# Patient Record
Sex: Female | Born: 1950 | Race: White | Hispanic: No | Marital: Married | Smoking: Former smoker
Health system: Southern US, Community
[De-identification: ages and names within clinical notes are randomized; demographics above are authoritative.]

## PROBLEM LIST (undated history)

## (undated) DIAGNOSIS — K219 Gastro-esophageal reflux disease without esophagitis: Secondary | ICD-10-CM

## (undated) DIAGNOSIS — I10 Essential (primary) hypertension: Secondary | ICD-10-CM

## (undated) DIAGNOSIS — Z860100 Personal history of colon polyps, unspecified: Secondary | ICD-10-CM

## (undated) DIAGNOSIS — Z7901 Long term (current) use of anticoagulants: Secondary | ICD-10-CM

## (undated) DIAGNOSIS — I4819 Other persistent atrial fibrillation: Secondary | ICD-10-CM

## (undated) DIAGNOSIS — N393 Stress incontinence (female) (male): Secondary | ICD-10-CM

## (undated) DIAGNOSIS — Z973 Presence of spectacles and contact lenses: Secondary | ICD-10-CM

## (undated) DIAGNOSIS — M1990 Unspecified osteoarthritis, unspecified site: Secondary | ICD-10-CM

## (undated) DIAGNOSIS — I2510 Atherosclerotic heart disease of native coronary artery without angina pectoris: Secondary | ICD-10-CM

## (undated) DIAGNOSIS — E785 Hyperlipidemia, unspecified: Secondary | ICD-10-CM

## (undated) DIAGNOSIS — D494 Neoplasm of unspecified behavior of bladder: Secondary | ICD-10-CM

## (undated) DIAGNOSIS — H269 Unspecified cataract: Secondary | ICD-10-CM

## (undated) DIAGNOSIS — E039 Hypothyroidism, unspecified: Secondary | ICD-10-CM

## (undated) DIAGNOSIS — K08109 Complete loss of teeth, unspecified cause, unspecified class: Secondary | ICD-10-CM

## (undated) DIAGNOSIS — I252 Old myocardial infarction: Secondary | ICD-10-CM

## (undated) DIAGNOSIS — Z972 Presence of dental prosthetic device (complete) (partial): Secondary | ICD-10-CM

## (undated) DIAGNOSIS — G4730 Sleep apnea, unspecified: Secondary | ICD-10-CM

## (undated) DIAGNOSIS — E119 Type 2 diabetes mellitus without complications: Secondary | ICD-10-CM

## (undated) HISTORY — DX: Hypothyroidism, unspecified: E03.9

## (undated) HISTORY — DX: Unspecified cataract: H26.9

## (undated) HISTORY — DX: Hyperlipidemia, unspecified: E78.5

## (undated) HISTORY — PX: CARDIOVASCULAR STRESS TEST: SHX262

## (undated) HISTORY — DX: Atherosclerotic heart disease of native coronary artery without angina pectoris: I25.10

## (undated) HISTORY — PX: COLONOSCOPY: SHX174

## (undated) HISTORY — DX: Type 2 diabetes mellitus without complications: E11.9

---

## 1972-12-27 HISTORY — PX: TUBAL LIGATION: SHX77

## 1998-01-27 HISTORY — PX: CORONARY ANGIOPLASTY: SHX604

## 1998-04-14 ENCOUNTER — Encounter: Admission: RE | Admit: 1998-04-14 | Discharge: 1998-04-14 | Payer: Self-pay | Admitting: Family Medicine

## 1998-07-01 ENCOUNTER — Encounter: Admission: RE | Admit: 1998-07-01 | Discharge: 1998-07-01 | Payer: Self-pay | Admitting: Family Medicine

## 1998-08-19 ENCOUNTER — Encounter: Admission: RE | Admit: 1998-08-19 | Discharge: 1998-08-19 | Payer: Self-pay | Admitting: Sports Medicine

## 1998-08-19 ENCOUNTER — Other Ambulatory Visit: Admission: RE | Admit: 1998-08-19 | Discharge: 1998-08-19 | Payer: Self-pay | Admitting: Family Medicine

## 1998-11-13 ENCOUNTER — Encounter: Admission: RE | Admit: 1998-11-13 | Discharge: 1998-11-13 | Payer: Self-pay | Admitting: Family Medicine

## 1998-11-24 ENCOUNTER — Encounter: Admission: RE | Admit: 1998-11-24 | Discharge: 1998-11-24 | Payer: Self-pay | Admitting: Family Medicine

## 1999-01-09 ENCOUNTER — Inpatient Hospital Stay (HOSPITAL_COMMUNITY): Admission: EM | Admit: 1999-01-09 | Discharge: 1999-01-10 | Payer: Self-pay | Admitting: Emergency Medicine

## 1999-01-10 ENCOUNTER — Encounter: Payer: Self-pay | Admitting: *Deleted

## 1999-02-13 ENCOUNTER — Encounter: Admission: RE | Admit: 1999-02-13 | Discharge: 1999-02-13 | Payer: Self-pay | Admitting: Family Medicine

## 1999-02-23 ENCOUNTER — Encounter: Admission: RE | Admit: 1999-02-23 | Discharge: 1999-02-23 | Payer: Self-pay | Admitting: Family Medicine

## 1999-07-31 ENCOUNTER — Encounter: Admission: RE | Admit: 1999-07-31 | Discharge: 1999-07-31 | Payer: Self-pay | Admitting: Family Medicine

## 1999-08-20 ENCOUNTER — Encounter: Admission: RE | Admit: 1999-08-20 | Discharge: 1999-08-20 | Payer: Self-pay | Admitting: Family Medicine

## 1999-08-21 ENCOUNTER — Encounter: Admission: RE | Admit: 1999-08-21 | Discharge: 1999-08-21 | Payer: Self-pay | Admitting: Family Medicine

## 1999-10-09 ENCOUNTER — Encounter: Admission: RE | Admit: 1999-10-09 | Discharge: 1999-10-09 | Payer: Self-pay | Admitting: Family Medicine

## 2000-01-26 ENCOUNTER — Encounter: Admission: RE | Admit: 2000-01-26 | Discharge: 2000-01-26 | Payer: Self-pay | Admitting: Sports Medicine

## 2000-02-05 ENCOUNTER — Encounter: Payer: Self-pay | Admitting: Sports Medicine

## 2000-02-05 ENCOUNTER — Encounter: Admission: RE | Admit: 2000-02-05 | Discharge: 2000-02-05 | Payer: Self-pay | Admitting: Sports Medicine

## 2000-03-17 ENCOUNTER — Encounter: Admission: RE | Admit: 2000-03-17 | Discharge: 2000-03-17 | Payer: Self-pay | Admitting: Family Medicine

## 2000-05-10 ENCOUNTER — Encounter: Admission: RE | Admit: 2000-05-10 | Discharge: 2000-05-10 | Payer: Self-pay | Admitting: Family Medicine

## 2000-06-07 ENCOUNTER — Encounter: Admission: RE | Admit: 2000-06-07 | Discharge: 2000-06-07 | Payer: Self-pay | Admitting: Family Medicine

## 2000-08-17 ENCOUNTER — Ambulatory Visit (HOSPITAL_COMMUNITY): Admission: RE | Admit: 2000-08-17 | Discharge: 2000-08-17 | Payer: Self-pay | Admitting: Family Medicine

## 2000-08-17 ENCOUNTER — Encounter: Admission: RE | Admit: 2000-08-17 | Discharge: 2000-08-17 | Payer: Self-pay | Admitting: Family Medicine

## 2000-08-19 ENCOUNTER — Encounter: Admission: RE | Admit: 2000-08-19 | Discharge: 2000-08-19 | Payer: Self-pay | Admitting: Family Medicine

## 2000-10-17 ENCOUNTER — Encounter: Admission: RE | Admit: 2000-10-17 | Discharge: 2000-10-17 | Payer: Self-pay | Admitting: Family Medicine

## 2000-10-18 ENCOUNTER — Encounter: Admission: RE | Admit: 2000-10-18 | Discharge: 2000-10-18 | Payer: Self-pay | Admitting: Family Medicine

## 2000-11-11 ENCOUNTER — Encounter: Admission: RE | Admit: 2000-11-11 | Discharge: 2000-11-11 | Payer: Self-pay | Admitting: Family Medicine

## 2000-12-07 ENCOUNTER — Encounter: Admission: RE | Admit: 2000-12-07 | Discharge: 2000-12-07 | Payer: Self-pay | Admitting: Family Medicine

## 2001-01-31 ENCOUNTER — Emergency Department (HOSPITAL_COMMUNITY): Admission: EM | Admit: 2001-01-31 | Discharge: 2001-01-31 | Payer: Self-pay | Admitting: *Deleted

## 2001-03-13 ENCOUNTER — Encounter: Admission: RE | Admit: 2001-03-13 | Discharge: 2001-03-13 | Payer: Self-pay | Admitting: Family Medicine

## 2001-04-24 ENCOUNTER — Encounter: Admission: RE | Admit: 2001-04-24 | Discharge: 2001-04-24 | Payer: Self-pay | Admitting: Family Medicine

## 2001-07-18 ENCOUNTER — Encounter: Admission: RE | Admit: 2001-07-18 | Discharge: 2001-07-18 | Payer: Self-pay | Admitting: Family Medicine

## 2001-07-25 ENCOUNTER — Encounter: Admission: RE | Admit: 2001-07-25 | Discharge: 2001-07-25 | Payer: Self-pay | Admitting: Family Medicine

## 2001-09-26 ENCOUNTER — Encounter: Admission: RE | Admit: 2001-09-26 | Discharge: 2001-09-26 | Payer: Self-pay | Admitting: Family Medicine

## 2001-10-16 ENCOUNTER — Encounter: Admission: RE | Admit: 2001-10-16 | Discharge: 2001-10-16 | Payer: Self-pay | Admitting: Family Medicine

## 2002-03-28 ENCOUNTER — Encounter: Admission: RE | Admit: 2002-03-28 | Discharge: 2002-03-28 | Payer: Self-pay | Admitting: Family Medicine

## 2002-04-19 ENCOUNTER — Encounter: Admission: RE | Admit: 2002-04-19 | Discharge: 2002-04-19 | Payer: Self-pay | Admitting: Family Medicine

## 2002-04-20 ENCOUNTER — Encounter: Admission: RE | Admit: 2002-04-20 | Discharge: 2002-04-20 | Payer: Self-pay | Admitting: *Deleted

## 2002-04-25 ENCOUNTER — Encounter: Admission: RE | Admit: 2002-04-25 | Discharge: 2002-04-25 | Payer: Self-pay | Admitting: Family Medicine

## 2002-05-14 ENCOUNTER — Encounter: Admission: RE | Admit: 2002-05-14 | Discharge: 2002-05-14 | Payer: Self-pay | Admitting: Family Medicine

## 2002-06-28 ENCOUNTER — Encounter: Admission: RE | Admit: 2002-06-28 | Discharge: 2002-06-28 | Payer: Self-pay | Admitting: Family Medicine

## 2002-09-10 ENCOUNTER — Encounter: Admission: RE | Admit: 2002-09-10 | Discharge: 2002-09-10 | Payer: Self-pay | Admitting: Family Medicine

## 2002-09-10 ENCOUNTER — Encounter: Admission: RE | Admit: 2002-09-10 | Discharge: 2002-09-10 | Payer: Self-pay | Admitting: Sports Medicine

## 2002-09-10 ENCOUNTER — Encounter: Payer: Self-pay | Admitting: Sports Medicine

## 2002-10-02 ENCOUNTER — Encounter: Admission: RE | Admit: 2002-10-02 | Discharge: 2002-10-02 | Payer: Self-pay | Admitting: Family Medicine

## 2002-10-09 ENCOUNTER — Encounter: Admission: RE | Admit: 2002-10-09 | Discharge: 2002-10-09 | Payer: Self-pay | Admitting: Family Medicine

## 2002-10-23 ENCOUNTER — Encounter: Admission: RE | Admit: 2002-10-23 | Discharge: 2002-10-23 | Payer: Self-pay | Admitting: Family Medicine

## 2002-11-06 ENCOUNTER — Encounter: Admission: RE | Admit: 2002-11-06 | Discharge: 2002-11-06 | Payer: Self-pay | Admitting: Family Medicine

## 2002-11-08 ENCOUNTER — Encounter: Admission: RE | Admit: 2002-11-08 | Discharge: 2002-11-08 | Payer: Self-pay | Admitting: Family Medicine

## 2002-12-25 ENCOUNTER — Encounter: Admission: RE | Admit: 2002-12-25 | Discharge: 2002-12-25 | Payer: Self-pay | Admitting: Family Medicine

## 2003-04-24 ENCOUNTER — Encounter: Admission: RE | Admit: 2003-04-24 | Discharge: 2003-04-24 | Payer: Self-pay | Admitting: Family Medicine

## 2003-06-25 ENCOUNTER — Encounter: Admission: RE | Admit: 2003-06-25 | Discharge: 2003-06-25 | Payer: Self-pay | Admitting: Sports Medicine

## 2004-05-18 ENCOUNTER — Emergency Department (HOSPITAL_COMMUNITY): Admission: EM | Admit: 2004-05-18 | Discharge: 2004-05-18 | Payer: Self-pay | Admitting: Emergency Medicine

## 2004-07-22 ENCOUNTER — Encounter: Admission: RE | Admit: 2004-07-22 | Discharge: 2004-07-22 | Payer: Self-pay | Admitting: Family Medicine

## 2004-07-27 ENCOUNTER — Encounter: Admission: RE | Admit: 2004-07-27 | Discharge: 2004-07-27 | Payer: Self-pay | Admitting: Family Medicine

## 2004-07-28 ENCOUNTER — Encounter: Admission: RE | Admit: 2004-07-28 | Discharge: 2004-07-28 | Payer: Self-pay | Admitting: Sports Medicine

## 2004-08-26 ENCOUNTER — Encounter: Admission: RE | Admit: 2004-08-26 | Discharge: 2004-08-26 | Payer: Self-pay | Admitting: Family Medicine

## 2004-09-02 ENCOUNTER — Ambulatory Visit: Payer: Self-pay | Admitting: Family Medicine

## 2004-10-06 ENCOUNTER — Ambulatory Visit: Payer: Self-pay | Admitting: Family Medicine

## 2004-10-12 ENCOUNTER — Ambulatory Visit: Payer: Self-pay | Admitting: Sports Medicine

## 2004-11-11 ENCOUNTER — Ambulatory Visit (HOSPITAL_COMMUNITY): Admission: RE | Admit: 2004-11-11 | Discharge: 2004-11-11 | Payer: Self-pay | Admitting: Gastroenterology

## 2004-11-11 ENCOUNTER — Encounter (INDEPENDENT_AMBULATORY_CARE_PROVIDER_SITE_OTHER): Payer: Self-pay | Admitting: Specialist

## 2004-11-11 DIAGNOSIS — Z860100 Personal history of colon polyps, unspecified: Secondary | ICD-10-CM | POA: Insufficient documentation

## 2005-05-19 ENCOUNTER — Emergency Department (HOSPITAL_COMMUNITY): Admission: EM | Admit: 2005-05-19 | Discharge: 2005-05-19 | Payer: Self-pay | Admitting: Family Medicine

## 2006-04-26 ENCOUNTER — Encounter (INDEPENDENT_AMBULATORY_CARE_PROVIDER_SITE_OTHER): Payer: Self-pay | Admitting: *Deleted

## 2006-04-26 LAB — CONVERTED CEMR LAB

## 2006-05-09 ENCOUNTER — Ambulatory Visit: Payer: Self-pay | Admitting: Family Medicine

## 2006-05-26 ENCOUNTER — Ambulatory Visit: Payer: Self-pay | Admitting: Family Medicine

## 2006-10-03 ENCOUNTER — Emergency Department (HOSPITAL_COMMUNITY): Admission: EM | Admit: 2006-10-03 | Discharge: 2006-10-03 | Payer: Self-pay | Admitting: Emergency Medicine

## 2007-01-13 ENCOUNTER — Ambulatory Visit: Payer: Self-pay | Admitting: Family Medicine

## 2007-02-13 ENCOUNTER — Ambulatory Visit: Payer: Self-pay | Admitting: Family Medicine

## 2007-02-16 ENCOUNTER — Ambulatory Visit: Payer: Self-pay | Admitting: Family Medicine

## 2007-02-16 ENCOUNTER — Encounter (INDEPENDENT_AMBULATORY_CARE_PROVIDER_SITE_OTHER): Payer: Self-pay | Admitting: Family Medicine

## 2007-02-16 LAB — CONVERTED CEMR LAB
Cholesterol: 139 mg/dL (ref 0–200)
HDL: 49 mg/dL (ref >39)
LDL Cholesterol: 64 mg/dL (ref 0–99)
TSH: 0.687 microintl units/mL (ref 0.350–5.50)
Total CHOL/HDL Ratio: 2.8
Triglycerides: 130 mg/dL (ref <150)
VLDL: 26 mg/dL (ref 0–40)

## 2007-02-23 DIAGNOSIS — E039 Hypothyroidism, unspecified: Secondary | ICD-10-CM | POA: Insufficient documentation

## 2007-02-23 DIAGNOSIS — I252 Old myocardial infarction: Secondary | ICD-10-CM | POA: Insufficient documentation

## 2007-02-24 ENCOUNTER — Encounter (INDEPENDENT_AMBULATORY_CARE_PROVIDER_SITE_OTHER): Payer: Self-pay | Admitting: *Deleted

## 2007-05-26 ENCOUNTER — Ambulatory Visit: Payer: Self-pay | Admitting: Family Medicine

## 2007-08-28 LAB — CONVERTED CEMR LAB: Pap Smear: NORMAL

## 2007-09-04 ENCOUNTER — Encounter (INDEPENDENT_AMBULATORY_CARE_PROVIDER_SITE_OTHER): Payer: Self-pay | Admitting: Family Medicine

## 2007-09-04 ENCOUNTER — Ambulatory Visit: Payer: Self-pay | Admitting: Sports Medicine

## 2007-09-04 LAB — CONVERTED CEMR LAB: Whiff Test: POSITIVE

## 2007-09-06 ENCOUNTER — Encounter (INDEPENDENT_AMBULATORY_CARE_PROVIDER_SITE_OTHER): Payer: Self-pay | Admitting: Family Medicine

## 2007-09-26 ENCOUNTER — Telehealth (INDEPENDENT_AMBULATORY_CARE_PROVIDER_SITE_OTHER): Payer: Self-pay | Admitting: *Deleted

## 2007-09-26 ENCOUNTER — Ambulatory Visit: Payer: Self-pay | Admitting: Family Medicine

## 2007-09-26 ENCOUNTER — Encounter (INDEPENDENT_AMBULATORY_CARE_PROVIDER_SITE_OTHER): Payer: Self-pay | Admitting: Family Medicine

## 2007-10-26 ENCOUNTER — Ambulatory Visit: Payer: Self-pay | Admitting: Family Medicine

## 2008-01-02 ENCOUNTER — Telehealth: Payer: Self-pay | Admitting: *Deleted

## 2008-01-03 ENCOUNTER — Ambulatory Visit: Payer: Self-pay | Admitting: Family Medicine

## 2008-01-03 ENCOUNTER — Encounter (INDEPENDENT_AMBULATORY_CARE_PROVIDER_SITE_OTHER): Payer: Self-pay | Admitting: *Deleted

## 2008-01-04 ENCOUNTER — Encounter: Admission: RE | Admit: 2008-01-04 | Discharge: 2008-01-04 | Payer: Self-pay | Admitting: Sports Medicine

## 2008-01-09 ENCOUNTER — Telehealth (INDEPENDENT_AMBULATORY_CARE_PROVIDER_SITE_OTHER): Payer: Self-pay | Admitting: Family Medicine

## 2008-03-15 ENCOUNTER — Ambulatory Visit: Payer: Self-pay | Admitting: Family Medicine

## 2008-03-18 ENCOUNTER — Encounter (INDEPENDENT_AMBULATORY_CARE_PROVIDER_SITE_OTHER): Payer: Self-pay | Admitting: Family Medicine

## 2008-06-06 ENCOUNTER — Encounter (INDEPENDENT_AMBULATORY_CARE_PROVIDER_SITE_OTHER): Payer: Self-pay | Admitting: Family Medicine

## 2008-06-06 ENCOUNTER — Encounter: Payer: Self-pay | Admitting: *Deleted

## 2008-06-06 ENCOUNTER — Ambulatory Visit: Payer: Self-pay | Admitting: Family Medicine

## 2008-06-06 LAB — CONVERTED CEMR LAB

## 2008-06-11 ENCOUNTER — Encounter (INDEPENDENT_AMBULATORY_CARE_PROVIDER_SITE_OTHER): Payer: Self-pay | Admitting: Family Medicine

## 2008-06-11 LAB — CONVERTED CEMR LAB
ALT: 21 units/L (ref 0–35)
AST: 19 units/L (ref 0–37)
Albumin: 4.1 g/dL (ref 3.5–5.2)
Alkaline Phosphatase: 68 units/L (ref 39–117)
BUN: 15 mg/dL (ref 6–23)
CO2: 24 meq/L (ref 19–32)
Calcium: 9.2 mg/dL (ref 8.4–10.5)
Chloride: 106 meq/L (ref 96–112)
Cholesterol: 180 mg/dL (ref 0–200)
Creatinine, Ser: 0.88 mg/dL (ref 0.40–1.20)
Glucose, Bld: 100 mg/dL — ABNORMAL HIGH (ref 70–99)
HDL: 63 mg/dL (ref >39)
LDL Cholesterol: 89 mg/dL (ref 0–99)
Potassium: 4.4 meq/L (ref 3.5–5.3)
Sodium: 140 meq/L (ref 135–145)
TSH: 3.441 microintl units/mL (ref 0.350–5.50)
Total Bilirubin: 0.5 mg/dL (ref 0.3–1.2)
Total CHOL/HDL Ratio: 2.9
Total Protein: 7.2 g/dL (ref 6.0–8.3)
Triglycerides: 142 mg/dL (ref <150)
VLDL: 28 mg/dL (ref 0–40)

## 2008-07-17 ENCOUNTER — Ambulatory Visit: Payer: Self-pay | Admitting: Family Medicine

## 2008-09-13 ENCOUNTER — Ambulatory Visit: Payer: Self-pay | Admitting: Family Medicine

## 2008-09-13 ENCOUNTER — Encounter (INDEPENDENT_AMBULATORY_CARE_PROVIDER_SITE_OTHER): Payer: Self-pay | Admitting: Family Medicine

## 2008-09-13 DIAGNOSIS — M25559 Pain in unspecified hip: Secondary | ICD-10-CM | POA: Insufficient documentation

## 2008-09-13 LAB — CONVERTED CEMR LAB
ALT: 22 units/L (ref 0–35)
AST: 22 units/L (ref 0–37)
Albumin: 4.1 g/dL (ref 3.5–5.2)
Alkaline Phosphatase: 65 units/L (ref 39–117)
BUN: 16 mg/dL (ref 6–23)
CO2: 23 meq/L (ref 19–32)
Calcium: 8.7 mg/dL (ref 8.4–10.5)
Chloride: 105 meq/L (ref 96–112)
Creatinine, Ser: 0.87 mg/dL (ref 0.40–1.20)
Glucose, Bld: 95 mg/dL (ref 70–99)
Potassium: 4.3 meq/L (ref 3.5–5.3)
Sodium: 138 meq/L (ref 135–145)
Total Bilirubin: 0.6 mg/dL (ref 0.3–1.2)
Total Protein: 6.8 g/dL (ref 6.0–8.3)

## 2008-09-18 ENCOUNTER — Encounter: Admission: RE | Admit: 2008-09-18 | Discharge: 2008-09-18 | Payer: Self-pay | Admitting: Family Medicine

## 2008-09-19 ENCOUNTER — Telehealth (INDEPENDENT_AMBULATORY_CARE_PROVIDER_SITE_OTHER): Payer: Self-pay | Admitting: Family Medicine

## 2008-09-23 ENCOUNTER — Telehealth (INDEPENDENT_AMBULATORY_CARE_PROVIDER_SITE_OTHER): Payer: Self-pay | Admitting: *Deleted

## 2008-10-01 ENCOUNTER — Encounter: Payer: Self-pay | Admitting: Family Medicine

## 2008-10-01 ENCOUNTER — Encounter (INDEPENDENT_AMBULATORY_CARE_PROVIDER_SITE_OTHER): Payer: Self-pay | Admitting: Family Medicine

## 2008-10-01 ENCOUNTER — Ambulatory Visit: Payer: Self-pay | Admitting: Family Medicine

## 2008-10-01 DIAGNOSIS — N8500 Endometrial hyperplasia, unspecified: Secondary | ICD-10-CM | POA: Insufficient documentation

## 2008-10-03 ENCOUNTER — Telehealth (INDEPENDENT_AMBULATORY_CARE_PROVIDER_SITE_OTHER): Payer: Self-pay | Admitting: *Deleted

## 2008-10-09 ENCOUNTER — Telehealth: Payer: Self-pay | Admitting: Family Medicine

## 2008-10-10 ENCOUNTER — Encounter: Payer: Self-pay | Admitting: Family Medicine

## 2009-03-04 ENCOUNTER — Ambulatory Visit: Payer: Self-pay | Admitting: Family Medicine

## 2009-03-10 ENCOUNTER — Ambulatory Visit (HOSPITAL_COMMUNITY): Admission: RE | Admit: 2009-03-10 | Discharge: 2009-03-10 | Payer: Self-pay | Admitting: Family Medicine

## 2009-03-11 ENCOUNTER — Telehealth (INDEPENDENT_AMBULATORY_CARE_PROVIDER_SITE_OTHER): Payer: Self-pay | Admitting: Family Medicine

## 2009-03-31 ENCOUNTER — Encounter (INDEPENDENT_AMBULATORY_CARE_PROVIDER_SITE_OTHER): Payer: Self-pay | Admitting: Family Medicine

## 2009-04-29 ENCOUNTER — Ambulatory Visit: Payer: Self-pay | Admitting: Cardiology

## 2009-04-29 DIAGNOSIS — E785 Hyperlipidemia, unspecified: Secondary | ICD-10-CM | POA: Insufficient documentation

## 2009-04-29 DIAGNOSIS — I2510 Atherosclerotic heart disease of native coronary artery without angina pectoris: Secondary | ICD-10-CM | POA: Insufficient documentation

## 2009-04-29 DIAGNOSIS — I10 Essential (primary) hypertension: Secondary | ICD-10-CM | POA: Insufficient documentation

## 2009-04-29 DIAGNOSIS — E669 Obesity, unspecified: Secondary | ICD-10-CM | POA: Insufficient documentation

## 2009-04-30 ENCOUNTER — Telehealth (INDEPENDENT_AMBULATORY_CARE_PROVIDER_SITE_OTHER): Payer: Self-pay | Admitting: Radiology

## 2009-05-01 ENCOUNTER — Encounter: Payer: Self-pay | Admitting: Cardiology

## 2009-05-01 ENCOUNTER — Ambulatory Visit: Payer: Self-pay

## 2009-05-08 ENCOUNTER — Encounter (INDEPENDENT_AMBULATORY_CARE_PROVIDER_SITE_OTHER): Payer: Self-pay | Admitting: Obstetrics and Gynecology

## 2009-05-08 ENCOUNTER — Ambulatory Visit (HOSPITAL_COMMUNITY): Admission: RE | Admit: 2009-05-08 | Discharge: 2009-05-08 | Payer: Self-pay | Admitting: Obstetrics and Gynecology

## 2009-05-08 HISTORY — PX: OTHER SURGICAL HISTORY: SHX169

## 2009-05-22 ENCOUNTER — Encounter: Payer: Self-pay | Admitting: Family Medicine

## 2009-06-06 ENCOUNTER — Ambulatory Visit: Payer: Self-pay | Admitting: Family Medicine

## 2009-06-10 ENCOUNTER — Encounter (INDEPENDENT_AMBULATORY_CARE_PROVIDER_SITE_OTHER): Payer: Self-pay | Admitting: Family Medicine

## 2009-06-10 ENCOUNTER — Ambulatory Visit: Payer: Self-pay | Admitting: Family Medicine

## 2009-06-11 ENCOUNTER — Encounter (INDEPENDENT_AMBULATORY_CARE_PROVIDER_SITE_OTHER): Payer: Self-pay | Admitting: Family Medicine

## 2009-06-11 LAB — CONVERTED CEMR LAB
Cholesterol: 197 mg/dL (ref 0–200)
HDL: 57 mg/dL (ref >39)
LDL Cholesterol: 120 mg/dL — ABNORMAL HIGH (ref 0–99)
TSH: 0.421 microintl units/mL (ref 0.350–4.500)
Total CHOL/HDL Ratio: 3.5
Triglycerides: 102 mg/dL (ref <150)
VLDL: 20 mg/dL (ref 0–40)

## 2009-07-04 ENCOUNTER — Encounter: Payer: Self-pay | Admitting: *Deleted

## 2009-09-10 ENCOUNTER — Ambulatory Visit: Payer: Self-pay | Admitting: Family Medicine

## 2009-09-10 ENCOUNTER — Telehealth: Payer: Self-pay | Admitting: Family Medicine

## 2009-09-24 ENCOUNTER — Ambulatory Visit: Payer: Self-pay | Admitting: Family Medicine

## 2009-10-16 ENCOUNTER — Telehealth: Payer: Self-pay | Admitting: *Deleted

## 2009-10-17 ENCOUNTER — Telehealth: Payer: Self-pay | Admitting: Family Medicine

## 2009-10-17 ENCOUNTER — Ambulatory Visit: Payer: Self-pay | Admitting: Family Medicine

## 2009-10-17 ENCOUNTER — Encounter: Payer: Self-pay | Admitting: Family Medicine

## 2009-10-17 LAB — CONVERTED CEMR LAB
BUN: 18 mg/dL (ref 6–23)
CO2: 24 meq/L (ref 19–32)
Calcium: 9.2 mg/dL (ref 8.4–10.5)
Chloride: 107 meq/L (ref 96–112)
Creatinine, Ser: 0.93 mg/dL (ref 0.40–1.20)
Glucose, Bld: 85 mg/dL (ref 70–99)
Potassium: 4.1 meq/L (ref 3.5–5.3)
Sodium: 143 meq/L (ref 135–145)

## 2009-10-19 ENCOUNTER — Encounter: Payer: Self-pay | Admitting: Family Medicine

## 2009-10-23 ENCOUNTER — Ambulatory Visit: Payer: Self-pay | Admitting: Family Medicine

## 2009-10-27 ENCOUNTER — Encounter: Admission: RE | Admit: 2009-10-27 | Discharge: 2009-11-12 | Payer: Self-pay | Admitting: Family Medicine

## 2009-11-14 ENCOUNTER — Encounter: Payer: Self-pay | Admitting: Family Medicine

## 2009-11-27 ENCOUNTER — Telehealth (INDEPENDENT_AMBULATORY_CARE_PROVIDER_SITE_OTHER): Payer: Self-pay | Admitting: *Deleted

## 2009-11-28 ENCOUNTER — Ambulatory Visit: Payer: Self-pay | Admitting: Family Medicine

## 2010-02-13 ENCOUNTER — Ambulatory Visit: Payer: Self-pay | Admitting: Family Medicine

## 2010-02-23 ENCOUNTER — Ambulatory Visit: Payer: Self-pay | Admitting: Family Medicine

## 2010-04-10 ENCOUNTER — Telehealth: Payer: Self-pay | Admitting: *Deleted

## 2010-04-27 ENCOUNTER — Emergency Department (HOSPITAL_COMMUNITY): Admission: EM | Admit: 2010-04-27 | Discharge: 2010-04-27 | Payer: Self-pay | Admitting: Emergency Medicine

## 2010-04-27 ENCOUNTER — Encounter: Payer: Self-pay | Admitting: Cardiology

## 2010-04-29 ENCOUNTER — Ambulatory Visit: Payer: Self-pay | Admitting: Cardiology

## 2010-04-29 DIAGNOSIS — I4891 Unspecified atrial fibrillation: Secondary | ICD-10-CM | POA: Insufficient documentation

## 2010-04-30 LAB — CONVERTED CEMR LAB: TSH: 1.26 microintl units/mL (ref 0.35–5.50)

## 2010-05-18 ENCOUNTER — Ambulatory Visit: Payer: Self-pay

## 2010-05-18 ENCOUNTER — Ambulatory Visit (HOSPITAL_COMMUNITY): Admission: RE | Admit: 2010-05-18 | Discharge: 2010-05-18 | Payer: Self-pay | Admitting: Cardiology

## 2010-05-18 ENCOUNTER — Ambulatory Visit: Payer: Self-pay | Admitting: Cardiology

## 2010-05-18 ENCOUNTER — Encounter: Payer: Self-pay | Admitting: Cardiology

## 2010-05-27 ENCOUNTER — Telehealth: Payer: Self-pay | Admitting: Cardiology

## 2010-05-28 ENCOUNTER — Encounter: Payer: Self-pay | Admitting: Cardiology

## 2010-06-01 ENCOUNTER — Encounter: Payer: Self-pay | Admitting: Family Medicine

## 2010-06-01 ENCOUNTER — Ambulatory Visit: Payer: Self-pay | Admitting: Family Medicine

## 2010-06-01 ENCOUNTER — Ambulatory Visit: Payer: Self-pay | Admitting: Cardiology

## 2010-06-01 LAB — CONVERTED CEMR LAB
ALT: 15 units/L (ref 0–35)
AST: 17 units/L (ref 0–37)
Albumin: 4 g/dL (ref 3.5–5.2)
Alkaline Phosphatase: 65 units/L (ref 39–117)
BUN: 15 mg/dL (ref 6–23)
CO2: 25 meq/L (ref 19–32)
Calcium: 9.2 mg/dL (ref 8.4–10.5)
Chloride: 105 meq/L (ref 96–112)
Creatinine, Ser: 0.85 mg/dL (ref 0.40–1.20)
Direct LDL: 125 mg/dL — ABNORMAL HIGH
Glucose, Bld: 87 mg/dL (ref 70–99)
Potassium: 4.2 meq/L (ref 3.5–5.3)
Sodium: 139 meq/L (ref 135–145)
Total Bilirubin: 0.3 mg/dL (ref 0.3–1.2)
Total Protein: 6.9 g/dL (ref 6.0–8.3)

## 2010-06-02 ENCOUNTER — Telehealth: Payer: Self-pay | Admitting: Family Medicine

## 2010-06-04 ENCOUNTER — Encounter: Payer: Self-pay | Admitting: Family Medicine

## 2010-06-08 ENCOUNTER — Encounter: Admission: RE | Admit: 2010-06-08 | Discharge: 2010-06-08 | Payer: Self-pay | Admitting: Family Medicine

## 2010-06-15 ENCOUNTER — Telehealth: Payer: Self-pay | Admitting: Family Medicine

## 2010-06-15 ENCOUNTER — Encounter: Payer: Self-pay | Admitting: Family Medicine

## 2010-07-20 ENCOUNTER — Ambulatory Visit: Payer: Self-pay | Admitting: Family Medicine

## 2010-07-20 DIAGNOSIS — M169 Osteoarthritis of hip, unspecified: Secondary | ICD-10-CM | POA: Insufficient documentation

## 2010-07-20 DIAGNOSIS — M1610 Unilateral primary osteoarthritis, unspecified hip: Secondary | ICD-10-CM | POA: Insufficient documentation

## 2010-07-27 ENCOUNTER — Encounter: Payer: Self-pay | Admitting: Family Medicine

## 2010-07-31 ENCOUNTER — Encounter: Admission: RE | Admit: 2010-07-31 | Discharge: 2010-07-31 | Payer: Self-pay | Admitting: Orthopedic Surgery

## 2010-08-03 ENCOUNTER — Encounter: Payer: Self-pay | Admitting: Family Medicine

## 2010-08-24 ENCOUNTER — Encounter (HOSPITAL_COMMUNITY): Admission: RE | Admit: 2010-08-24 | Discharge: 2010-09-16 | Payer: Self-pay | Admitting: Orthopedic Surgery

## 2010-08-27 ENCOUNTER — Encounter: Payer: Self-pay | Admitting: Family Medicine

## 2010-09-15 ENCOUNTER — Encounter: Payer: Self-pay | Admitting: Family Medicine

## 2010-09-29 ENCOUNTER — Encounter: Payer: Self-pay | Admitting: Family Medicine

## 2010-09-30 ENCOUNTER — Telehealth (INDEPENDENT_AMBULATORY_CARE_PROVIDER_SITE_OTHER): Payer: Self-pay | Admitting: *Deleted

## 2010-09-30 ENCOUNTER — Encounter: Admission: RE | Admit: 2010-09-30 | Discharge: 2010-09-30 | Payer: Self-pay | Admitting: Orthopedic Surgery

## 2010-10-07 ENCOUNTER — Telehealth: Payer: Self-pay | Admitting: Cardiology

## 2010-10-12 ENCOUNTER — Telehealth: Payer: Self-pay | Admitting: Cardiology

## 2010-10-14 ENCOUNTER — Encounter: Payer: Self-pay | Admitting: Cardiology

## 2010-10-15 ENCOUNTER — Telehealth (INDEPENDENT_AMBULATORY_CARE_PROVIDER_SITE_OTHER): Payer: Self-pay | Admitting: *Deleted

## 2010-10-15 ENCOUNTER — Ambulatory Visit: Payer: Self-pay | Admitting: Family Medicine

## 2010-10-15 ENCOUNTER — Telehealth: Payer: Self-pay | Admitting: Cardiology

## 2010-12-11 ENCOUNTER — Inpatient Hospital Stay (HOSPITAL_COMMUNITY)
Admission: RE | Admit: 2010-12-11 | Discharge: 2010-12-14 | Payer: Self-pay | Source: Home / Self Care | Attending: Orthopedic Surgery | Admitting: Orthopedic Surgery

## 2010-12-11 HISTORY — PX: TOTAL HIP ARTHROPLASTY: SHX124

## 2011-01-04 ENCOUNTER — Ambulatory Visit
Admission: RE | Admit: 2011-01-04 | Discharge: 2011-01-04 | Payer: Self-pay | Source: Home / Self Care | Attending: Cardiology | Admitting: Cardiology

## 2011-01-04 ENCOUNTER — Encounter: Payer: Self-pay | Admitting: Cardiology

## 2011-01-22 ENCOUNTER — Telehealth (INDEPENDENT_AMBULATORY_CARE_PROVIDER_SITE_OTHER): Payer: Self-pay | Admitting: *Deleted

## 2011-01-26 NOTE — Assessment & Plan Note (Signed)
Summary: head cold   Vital Signs:  Patient Profile:   60 Years Old Female Height:     63.5 inches Weight:      185 pounds Temp:     98.1 degrees F oral Pulse rate:   66 / minute BP sitting:   129 / 78  (right arm)  Pt. in pain?   no  Vitals Entered By: Arlyss Repress CMA, (September 26, 2007 9:55 AM)                  PCP:  Johney Maine MD  Chief Complaint:  cough x 2 days.  History of Present Illness: 60 yo WF who has been feeling sick x 1 1/2 weeks.  + Head congestion.  + chills.  No fever.  + runny nose, throat burning.  + ichy, watery eyes.  No ear pain, just decreased hearing.  Slight headache.  + Cough started last night, non-productive.  + sick contact at work.  Non-smoker.  No hx sinusitis.  No hx asthma.  No hx allergies.  No n/v/d.      Current Allergies: No known allergies      Review of Systems      See HPI   Physical Exam  General:     Well-developed, well-nourished, no acute distress.  Alert and oriented x 3.  Head:     Normocephalic, atraumatic, no abnormalities noted.  Eyes:     Pupils equal round and reactive to light, conjunctivae midly injected but without discharge, and lids clear.  No scleral icterus.  Ears:     No external deformities.  B TM occluded with cerumen.  Nose:     No external deformities.  Nares with minimal clear discharge and erythema.   Mouth:     Moist mucous membranes.  No erythema or exudate.  Neck:     Supple, non-tender, no thyromegaly, no anterior or posterior cervical lymphadenopathy.  Lungs:     Clear to auscultation bilaterally.  Normal work of breathing.  No wheezes, rales, or rhonchi.  Heart:     Regular rate and rhythm.  No murmur, rub, or gallop.  2+ Dorsalis Pedis pulses.  Skin:     Intact without suspicious lesions or rashes    Impression & Recommendations:  Problem # 1:  URI (ICD-465.9) Viral URI vs. Allergic Rhinitis.  Given afebrile, do not feel abx needed at this time.  Recommended OTC  Claritin or Zyrtec as well as Guaifenesin for symptomatic relief.  Has f/u with PMD in October. Her updated medication list for this problem includes:    Bayer Aspirin 325 Mg Tabs (Aspirin) .Marland Kitchen... Take 1 tablet by mouth once a day  Orders: [facility]- Est Level  3 (29562)   Complete Medication List: 1)  Bayer Aspirin 325 Mg Tabs (Aspirin) .... Take 1 tablet by mouth once a day 2)  Crestor 20 Mg Tabs (Rosuvastatin calcium) .... Take 1 tablet by mouth once a day 3)  Levothyroxine Sodium 125 Mcg Tabs (Levothyroxine sodium) .... Take 1 tablet by mouth once a day 4)  Flexeril 5 Mg Tabs (Cyclobenzaprine hcl) .Marland Kitchen.. 1 tablet by mouth at bedtime 5)  Tenormin 25 Mg Tabs (Atenolol) .Marland Kitchen.. 1 tab by mouth daily 6)  Gabapentin 300 Mg Caps (Gabapentin) .Marland Kitchen.. 1 tab by mouth tid   Patient Instructions: 1)  Keep your scheduled appointment with Dr. Karn Pickler in October. 2)  Try over the counter Claritin or Zyrtec per package instructions for relief of your symptoms.  Take  the Zyrtec before bedtime as it may cause drowsiness.   3)  You may also try over the counter Guaifenesin (Mucinex) two times a day or three times a day as needed for cough.   4)  Drink as much fluid as you can tolerate for the next few days.    ]

## 2011-01-26 NOTE — Assessment & Plan Note (Signed)
Summary: back pain,df   Vital Signs:  Patient profile:   60 year old female Weight:      181 pounds Pulse rate:   84 / minute BP sitting:   148 / 71  (right arm)  Vitals Entered By: Arlyss Repress CMA, (February 13, 2010 11:09 AM) CC: low back pain since monday. radiates some into right hip. worse with movements Is Patient Diabetic? No Pain Assessment Patient in pain? yes     Location: lower back Intensity: 9 Onset of pain  monday   Primary Care Provider:  Magnus Ivan MD  CC:  low back pain since monday. radiates some into right hip. worse with movements.  History of Present Illness: Back pain for 4 days, getting worse.  Has had this happen before and it took several days and muscle relaxants to help.  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  Pain radiates over lower back and not down her legs.  She denies problems with bowel or bladder.  Habits & Providers  Alcohol-Tobacco-Diet     Tobacco Status: quit  Current Medications (verified): 1)  Bayer Aspirin 325 Mg Tabs (Aspirin) .... Take 1 Tablet By Mouth Once A Day 2)  Crestor 20 Mg Tabs (Rosuvastatin Calcium) .... 2 Tabs By Mouth Daily 3)  Levothyroxine Sodium 125 Mcg Tabs (Levothyroxine Sodium) .... Take 1 Tablet By Mouth Once A Day 4)  Tenormin 25 Mg  Tabs (Atenolol) .Marland Kitchen.. 1 Tab By Mouth Daily 5)  Zyrtec Allergy 10 Mg  Tabs (Cetirizine Hcl) .Marland Kitchen.. 1 Tab Po Once Daily 6)  Atenolol 25 Mg Tabs (Atenolol) .... Qd 7)  Crestor 20 Mg Tabs (Rosuvastatin Calcium) .... Take One Tab Evening 8)  Levothroid 125 Mcg Tabs (Levothyroxine Sodium) .... Take One Tab A.m. 9)  Cyclobenzaprine Hcl 10 Mg Tabs (Cyclobenzaprine Hcl) .... One Tab Three Times A Day As Needed Muscle Spasm 10)  Naprosyn 500 Mg Tabs (Naproxen) .Marland Kitchen.. 1 Tab By Mouth Two Times A Day For One Week As Needed Pain 11)  Permethrin 5 % Crea (Permethrin) .... Cover Body From Neck To Toes and Leave On For 12 Hours Before Washing 12)  Claritin 10 Mg Tabs (Loratadine) .... Take 1  Pill Daily For Itching  Allergies (verified): No Known Drug Allergies  Review of Systems General:  Denies fever. MS:  Complains of low back pain; denies muscle weakness.  Physical Exam  General:  Uncomfortable when moving. Msk:  + straight leg raise on the left, unable to move lower back in ROM without spasm and pain. Neurologic:  Depressed DTR on right at knee and ankle, normal DTR on the left   Impression & Recommendations:  Problem # 1:  BACK PAIN, LOW (ICD-724.2)  Treat acutely with muscle relaxants and NSAIDS, gave stretching exercises to begin, ice, follow up with primary MD Her updated medication list for this problem includes:    Bayer Aspirin 325 Mg Tabs (Aspirin) .Marland Kitchen... Take 1 tablet by mouth once a day    Cyclobenzaprine Hcl 10 Mg Tabs (Cyclobenzaprine hcl) ..... One tab three times a day as needed muscle spasm    Naprosyn 500 Mg Tabs (Naproxen) .Marland Kitchen... 1 tab by mouth two times a day for one week as needed pain  Orders: [facility]- Est Level  3 (88416)  Complete Medication List: 1)  Bayer Aspirin 325 Mg Tabs (Aspirin) .... Take 1 tablet by mouth once a day 2)  Crestor 20 Mg Tabs (Rosuvastatin calcium) .... 2 tabs by mouth daily 3)  Levothyroxine Sodium 125 Mcg Tabs (Levothyroxine sodium) .... Take 1 tablet by mouth once a day 4)  Tenormin 25 Mg Tabs (Atenolol) .Marland Kitchen.. 1 tab by mouth daily 5)  Zyrtec Allergy 10 Mg Tabs (Cetirizine hcl) .Marland Kitchen.. 1 tab po once daily 6)  Atenolol 25 Mg Tabs (Atenolol) .... Qd 7)  Crestor 20 Mg Tabs (Rosuvastatin calcium) .... Take one tab evening 8)  Levothroid 125 Mcg Tabs (Levothyroxine sodium) .... Take one tab a.m. 9)  Cyclobenzaprine Hcl 10 Mg Tabs (Cyclobenzaprine hcl) .... One tab three times a day as needed muscle spasm 10)  Naprosyn 500 Mg Tabs (Naproxen) .Marland Kitchen.. 1 tab by mouth two times a day for one week as needed pain 11)  Permethrin 5 % Crea (Permethrin) .... Cover body from neck to toes and leave on for 12 hours before washing 12)  Claritin  10 Mg Tabs (Loratadine) .... Take 1 pill daily for itching  Patient Instructions: 1)  No work tomorrow 2)  Simple stretches this weekend, then exercise once you are better, back exercises at least 3-times per week. 3)  Ice at least 4 times a dayFollow up with Dr. Mayford Knife 2/28 Prescriptions: NAPROSYN 500 MG TABS (NAPROXEN) 1 tab by mouth two times a day for one week as needed pain  #60 x 3   Entered and Authorized by:   Luretha Murphy NP   Signed by:   Luretha Murphy NP on 02/13/2010   Method used:   Electronically to        [facility] [phone]* (retail)       [address]       [place], [place]  [address]       Ph: [phone]       Fax: [phone]   RxID:   [id number] CYCLOBENZAPRINE HCL 10 MG TABS (CYCLOBENZAPRINE HCL) one tab three times a day as needed muscle spasm  #90 x 1   Entered and Authorized by:   Luretha Murphy NP   Signed by:   Luretha Murphy NP on 02/13/2010   Method used:   Electronically to        [facility] [phone]* (retail)       [address]       [place], [place]  [address]       Ph: [phone]       Fax: [phone]   RxID:   [id number]

## 2011-01-26 NOTE — Letter (Signed)
Summary: Generic Letter  [facility]  [address]   [place], [place] [address]   Phone: [phone]  Fax: [phone]    10/19/2009  ROANNA REAVES [address] [place], [place]  [address]  Dear Ms. KINOSHITA,   I just wanted to let you know that your lab results were normal.  Please call me if you have any questions or concerns.           Sincerely,   Asher Muir MD  Appended Document: Generic Letter mailed.

## 2011-01-26 NOTE — Consult Note (Signed)
Summary: [facility]  [facility]   Imported By: Clydell Hakim 09/14/2010 15:51:40  _____________________________________________________________________  External Attachment:    Type:   Image     Comment:   External Document

## 2011-01-26 NOTE — Assessment & Plan Note (Signed)
Summary: f/u,df   Vital Signs:  Patient profile:   60 year old female Weight:      179.0 pounds Temp:     98.3 degrees F oral Pulse rate:   70 / minute BP sitting:   120 / 75  (left arm)  Vitals Entered By: Alphia Kava (June 06, 2009 4:34 PM) CC: f/u spotting Is Patient Diabetic? No   Primary Care Provider:  Johney Maine MD  CC:  f/u spotting.  History of Present Illness: 1)endometrial hyperplasia:  had some spotting and found to have enlarged endometrial stripe. Referred to GYN and per report had benign polpys removed.  no cancers found.  no bleeding since.  2)hypothyroid:  doing well.  no skin change, no constipation or diarrhea, no hair falling out, no palpitations or wt gain.  taking synthroid  3)depression:  doing well.  still w/ stressor of granddaugther, who she cares for, acting out.  copes well.  does not want medication. no SI/HI  4)CAD:  had recent stress test by [facility] and was told everything was normal.  no chest pain, no shortness of breath, no edema.    Habits & Providers  Alcohol-Tobacco-Diet     Tobacco Status: never  Allergies: No Known Drug Allergies  Social History: Smoking Status:  never  Review of Systems      See HPI  Physical Exam  General:  Well-developed,well-nourished,in no acute distress; alert,appropriate and cooperative throughout examination Lungs:  Normal respiratory effort, chest expands symmetrically. Lungs are clear to auscultation, no crackles or wheezes. Heart:  Regular rate and rhythm.  No murmur, rub, or gallop.      Impression & Recommendations:  Problem # 1:  ENDOMETRIAL HYPERPLASIA UNSPECIFIED (ICD-621.30) Assessment Improved glad that had benign polpys. fu w/ GYN Orders: [facility]- Est Level  3 ([id number])  Problem # 2:  HYPOTHYROIDISM, UNSPECIFIED (ICD-244.9) Assessment: Unchanged  check TSH next week and returns for fasting labs Her updated medication list for this problem includes:    Levothyroxine Sodium 125 Mcg  Tabs (Levothyroxine sodium) .Marland Kitchen... Take 1 tablet by mouth once a day  Future Orders: TSH-[facility] ([id number]) ... 06/05/2010  Problem # 3:  ESOPHAGITIS, UNSPECIFIED (ICD-530.10) Assessment: Improved declines meds.  coping well  Problem # 4:  MYOCARDIAL INFARCTION, OLD (ICD-412) Assessment: Unchanged  Her updated medication list for this problem includes:    Bayer Aspirin 325 Mg Tabs (Aspirin) .Marland Kitchen... Take 1 tablet by mouth once a day    Tenormin 25 Mg Tabs (Atenolol) .Marland Kitchen... 1 tab by mouth daily  Problem # 5:  HYPERCHOLESTEROLEMIA (ICD-272.0) Assessment: Unchanged  will have her return for flp Her updated medication list for this problem includes:    Crestor 20 Mg Tabs (Rosuvastatin calcium) .Marland Kitchen... Take 1 tablet by mouth once a day  Future Orders: Comp Met-[facility] ([id number]) ... 06/07/2009 Lipid-[facility] ([id number]) ... 05/30/2010  Complete Medication List: 1)  Bayer Aspirin 325 Mg Tabs (Aspirin) .... Take 1 tablet by mouth once a day 2)  Crestor 20 Mg Tabs (Rosuvastatin calcium) .... Take 1 tablet by mouth once a day 3)  Levothyroxine Sodium 125 Mcg Tabs (Levothyroxine sodium) .... Take 1 tablet by mouth once a day 4)  Tenormin 25 Mg Tabs (Atenolol) .Marland Kitchen.. 1 tab by mouth daily 5)  Zyrtec Allergy 10 Mg Tabs (Cetirizine hcl) .Marland Kitchen.. 1 tab po once daily  Patient Instructions: 1)  fu for LABS next week, morning.  2)  Dont' eat or drink except water or black coffee 3)  f/u in 6 months

## 2011-01-26 NOTE — Progress Notes (Signed)
Summary: phn msg  Phone Note Call from Patient Call back at Home Phone [phone]   Caller: Patient Summary of Call: g'daughter was there over the weekend and found out the she had scabies and now gmom is itching wants to know if she can get something called in to [facility] [address] Initial call taken by: De Nurse,  November 27, 2009 8:44 AM  Follow-up for Phone Call        advised patient that we will need to see her in order for something to be given for the itching. she cannot come today but appointment is scheduled for tomorrow AM Follow-up by: Theresia Lo RN,  November 27, 2009 9:16 AM

## 2011-01-26 NOTE — Letter (Signed)
Summary: Results Follow-up Letter  [facility]  [address]   [place], [place] [address]   Phone: [phone]  Fax: [phone]    09/06/2007  [address] [place], [place]  [address]  Dear Ms. KRALL,   The following are the results of your recent test(s):  Test     Result     Pap Smear    Normal  I look forward to seeing you soon.  Keep up the hard work and weight loss!  Tell [facility] hello!  Dr Johney Maine   _________________________________________________________  Please call for an appointment Or _________________________________________________________ _________________________________________________________ _________________________________________________________  Sincerely,  Johney Maine MD [facility]           Appended Document: Results Follow-up Letter mailed letter to pt

## 2011-01-26 NOTE — Assessment & Plan Note (Signed)
Summary: cpe/pap,tcb   Vital Signs:  Patient profile:   60 year old female Height:      63.5 inches Weight:      179.8 pounds BMI:     31.46 Temp:     98.4 degrees F oral Pulse rate:   64 / minute BP sitting:   120 / 74  (left arm) Cuff size:   regular  Vitals Entered By: Gladstone Pih (February 23, 2010 8:42 AM) CC: CPE Is Patient Diabetic? No Pain Assessment Patient in pain? no        Primary Care Provider:  Magnus Ivan MD  CC:  CPE.  History of Present Illness: complete physical exam: patient came to see me for yearly physical exam.  ROS patient complaining of pain on R heel  Habits & Providers  Alcohol-Tobacco-Diet     Tobacco Status: never  Current Problems (verified): 1)  Scabies  (ICD-133.0) 2)  Leg Pain, Left  (ICD-729.5) 3)  Dizziness  (ICD-780.4) 4)  Hyperlipidemia  (ICD-272.4) 5)  Essential Hypertension, Benign  (ICD-401.1) 6)  Obesity, Unspecified  (ICD-278.00) 7)  Preoperative Examination  (ICD-V72.84) 8)  Cad  (ICD-414.00) 9)  Endometrial Hyperplasia Unspecified  (ICD-621.30) 10)  Endometrial Hyperplasia Unspecified  (ICD-621.30) 11)  Hip Pain, Bilateral  (ICD-719.45) 12)  Onychomycosis, Toenails  (ICD-110.1) 13)  Myocardial Infarction, Old  (ICD-412) 14)  Hypothyroidism, Unspecified  (ICD-244.9) 15)  Hypercholesterolemia  (ICD-272.0) 16)  Esophagitis, Unspecified  (ICD-530.10) 17)  Back Pain, Low  (ICD-724.2)  Current Medications (verified): 1)  Bayer Aspirin 325 Mg Tabs (Aspirin) .... Take 1 Tablet By Mouth Once A Day 2)  Crestor 20 Mg Tabs (Rosuvastatin Calcium) .... 2 Tabs By Mouth Daily 3)  Levothyroxine Sodium 125 Mcg Tabs (Levothyroxine Sodium) .... Take 1 Tablet By Mouth Once A Day 4)  Tenormin 25 Mg  Tabs (Atenolol) .Marland Kitchen.. 1 Tab By Mouth Daily 5)  Zyrtec Allergy 10 Mg  Tabs (Cetirizine Hcl) .Marland Kitchen.. 1 Tab Po Once Daily 6)  Cyclobenzaprine Hcl 10 Mg Tabs (Cyclobenzaprine Hcl) .... One Tab Three Times A Day As Needed Muscle Spasm 7)   Naprosyn 500 Mg Tabs (Naproxen) .Marland Kitchen.. 1 Tab By Mouth Two Times A Day For One Week As Needed Pain 8)  Permethrin 5 % Crea (Permethrin) .... Cover Body From Neck To Toes and Leave On For 12 Hours Before Washing 9)  Claritin 10 Mg Tabs (Loratadine) .... 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  slightly injected conjunctivae Ears:  L tympanic membrane not visualized secondary to increased cerumen R tympanic membrane visualized.  pearly  Mouth:  moist mucus membranes Neck:  no masses and no thyromegaly.   Breasts:  no masses.  bilateral.  right and left breast with lower medial quadrant firmness symetrically Lungs:  normal respiratory effort, no crackles, and no wheezes, ronchi Heart:  normal rate, regular rhythm, no murmur, no gallop, and no rub.   Abdomen:  hypoactive bowel soundssoft, non-tender, no masses, no guarding, no rigidity, and no abdominal hernia.   Msk:  knees:  symmetrical on  inspection, no tenderness to palpation, joint line palpation, 5/5 bilateral reflexes.   Pulses:  2+ bilateral radial pulses 2+ bilateral pedal pulses Extremities:  1+ left pedal edema.   Neurologic:  alert & oriented X3, cranial nerves II-XII intact, and gait normal except for slight limp.  bilateral upper and lower reflexes  Skin:  macular purple lesions on lower back bilateral erythematous macular lesions on arms. foot callus on right 5th toe  Cervical Nodes:  no cervical lymphadenopathy Psych:  Oriented X3, memory intact for recent and remote, normally  interactive, good eye contact, not anxious appearing, not depressed appearing, and not agitated.     Impression & Recommendations:  Problem # 1:  HEALTH MAINTENANCE EXAM (ICD-V70.0) Assessment New  Patient came to get a complete physical exam.  Pt looking good.  Only other complaint was pain on right heel.  Suggested wearing more supportive shoes to see if pain goes away.    Orders: [facility] - Est  40-64 yrs ([address])  Complete Medication List: 1)  Bayer Aspirin 325 Mg Tabs (Aspirin) .... Take 1 tablet by mouth once a day 2)  Crestor 20 Mg Tabs (Rosuvastatin calcium) .... 2 tabs by mouth daily 3)  Levothyroxine Sodium 125 Mcg Tabs (Levothyroxine sodium) .... Take 1 tablet by mouth once a day 4)  Tenormin 25 Mg Tabs (Atenolol) .Marland Kitchen.. 1 tab by mouth daily 5)  Zyrtec Allergy 10 Mg Tabs (Cetirizine hcl) .Marland Kitchen.. 1 tab po once daily 6)  Cyclobenzaprine Hcl 10 Mg Tabs (Cyclobenzaprine hcl) .... One tab three times a day as needed muscle spasm 7)  Naprosyn 500 Mg Tabs (Naproxen) .Marland Kitchen.. 1 tab by mouth two times a day for one week as needed pain 8)  Permethrin 5 % Crea (Permethrin) .... Cover body from neck to toes and leave on for 12 hours before washing 9)  Claritin 10 Mg Tabs (Loratadine) .... Take 1 pill daily for itching  Patient Instructions: 1)  [name], you look fabulous!  Keep up the good work. 2)  You will need to see me next year for a full physical exam. 3)  Try a new pair of shoes at work and if that doesn't help please come back to see me! 4)  Thank you and be blessed!

## 2011-01-26 NOTE — Miscellaneous (Signed)
Summary: pt did not go to physical therapy  Clinical Lists Changes received note from PT that they tried to contact her for appt, but she did not return call

## 2011-01-26 NOTE — Assessment & Plan Note (Signed)
Summary: meds wk   Vital Signs:  Patient Profile:   60 Years Old Female Weight:      194 pounds Pulse rate:   63 / minute BP sitting:   119 / 63  Vitals Entered By: [name] CMA (May 26, 2007 1:36 PM)               Chief Complaint:  ONLY NEED REFILLS .  History of Present Illness: cc: refill meds HPI: [name] presents today for refill on meds.  She has been doing well since I last saw her.  1)Post MI: She has been taking atenolol regularly and tolerating well.  Desires refill. 2)Depression:  Pt states that the "rough spot" she had w/ her grandaughter has gotten better.  She had tried Celexa which helped but she states she does not need meds any longer.   3)Back pain:  Flexeril has helped and pt desires refill.    Reviewed the patient's medications in Dr Tiajuana Amass.  They are updated and current     Past Medical History:    Reviewed history from 02/23/2007 and no changes required:       ASCUS 1999       FU Cardiolite 1/00 no ischemia, EF 66%       PAP 10/01- benign reactive/reparative changes, perimenopausal,       s/p DMI 2/99 with urgent RCA PTCA, good LV fctn       s/p tubal ligation  Past Surgical History:    Reviewed history from 02/23/2007 and no changes required:       lipid panel 2/08: TC=139, TG=130, HDL=49, LDL=64 - 02/17/2007   Social History:    lives with husband and their granddaughter of whom they have coustody.  Grandaugther is Chief Technology Officer (my patient).  Pt still smokes, denies EtOH, drugs.    Is now working at [employer].   Risk Factors:  Tobacco use:  current Alcohol use:  no   Review of Systems      See HPI   Physical Exam  General:     Well-developed,well-nourished,in no acute distress; alert,appropriate and cooperative throughout examination Lungs:     normal respiratory effort, no crackles, and no wheezes.   Heart:     normal rate, regular rhythm, and no murmur.   Extremities:     R Thumb:  Equal strength and FROM compared  to left.  +numbness/tingling when dorsal surfaces of hands are pushed together.  No TTP at anterior wrist.   Psych:     Oriented X3, normally interactive, good eye contact, not anxious appearing, and not depressed appearing.      Impression & Recommendations:  Problem # 1:  MYOCARDIAL INFARCTION, OLD (ICD-412) Assessment: Unchanged Discussed atenolol vs metoprolol or coreg w/ ATtending.  Pt has done well on atenolol and her BP and HR tolerate it so we will continue with this.  If ever a bump in BP or HR we can switch to Coreg since on [facility] list.  Pt to continue ASA. Orders: [facility]- Est Level  3 ([id number])   Problem # 2:  DEPRESSION, MAJOR, RECURRENT (ICD-296.30) Assessment: Improved Pt seems in better spirits today.  Will not refill meds.  Encouraged pt to call if need any Orders: [facility]- Est Level  3 ([id number])   Problem # 3:  BACK PAIN, LOW (ICD-724.2) Assessment: Deteriorated Now that working, pt gets back spasms again.  Refilled Flexeril  Problem # 4:  Preventive Health Care (ICD-V70.0) Assessment: Unchanged Pt due for  Pap.  Will have her come in for Pap and full PE at next visit.  Problem # 5:  CARPAL TUNNEL SYNDROME (ICD-354.0) Assessment: Deteriorated Appears to be having exacerbation.  Pt to start wearing brace again.  Also will see if Flexeril will help with any muscle strain/tightness she may be having as she states her thumb pain is worse on days where back pain bad.     Patient Instructions: 1)  Please schedule a follow-up appointment in 1-2 months for pap. 2)  Use brace on right hand and see if painn/numbness get better. 3)  Take your prescribed medications 4)  Take Flexeril at night because it makes you sleepy.   5)  Call me if hand gets worse.

## 2011-01-26 NOTE — Letter (Signed)
Summary: Results Follow-up Letter  [facility]  [address]   [place], [place] [address]   Phone: [phone]  Fax: [phone]    06/11/2008  [address] [place], [place]  [address]  Dear [name],   The following are the results of your recent test(s):   _________________________________________________________ Cholesterol LDL(Bad cholesterol):    89      Your goal is less than:  100       HDL (Good cholesterol):     63   Your goal is more than: 60 _________________________________________________________ Other Tests: Your kidneys, sugar, liver, and thyroid tests were all normal.  Your cholesterol numbers are GREAT!  Please continue to take all your medicines. Great job!  _________________________________________________________  Please call for an appointment Or _________________________________________________________ _________________________________________________________ _________________________________________________________  Sincerely,  Johney Maine MD [facility]           Appended Document: Results Follow-up Letter Letter sent to pt via mail    ............................DELORES PATE-GADDY,CMA (AAMA)

## 2011-01-26 NOTE — Consult Note (Signed)
Summary: [facility]  [facility]   Imported By: Clydell Hakim 07/08/2009 16:33:54  _____________________________________________________________________  External Attachment:    Type:   Image     Comment:   External Document

## 2011-01-26 NOTE — Miscellaneous (Signed)
Summary: Informed Consent for Medical Procedure  Informed Consent for Medical Procedure   Imported By: Knox Royalty 10/18/2008 12:50:33  _____________________________________________________________________  External Attachment:    Type:   Image     Comment:   External Document

## 2011-01-26 NOTE — Miscellaneous (Signed)
  Clinical Lists Changes  Observations: Added new observation of ECHOINTERP: Left ventricle: The cavity size was normal. Wall thickness was     normal. The estimated ejection fraction was 65%. Wall motion was     normal; there were no regional wall motion abnormalities. Left     ventricular diastolic function parameters were normal. (05/18/2010 11:48)      Echocardiogram  Procedure date:  05/18/2010  Findings:      Left ventricle: The cavity size was normal. Wall thickness was     normal. The estimated ejection fraction was 65%. Wall motion was     normal; there were no regional wall motion abnormalities. Left     ventricular diastolic function parameters were normal.

## 2011-01-26 NOTE — Assessment & Plan Note (Signed)
Summary: ec6/palpitation pt seen in ED/lg   Referring Provider:  Dr. Dois Davenport A. Rivard Primary Provider:  Magnus Ivan MD   History of Present Illness: 60 year old female followed by Dr. Antoine Poche for history of coronary disease referred for evaluation of atrial fibrillation. Myoview in May of 2010 and showed an ejection fraction of 71% and no ischemia or infarction. Seen in the emergency room on Apr 27, 2010 with an episode of atrial fibrillation. Cardiac markers were negative. The patient  typically does not have dyspnea on exertion, orthopnea, PND, pedal edema, syncope or chest pain. On the day of her atrial fibrillation she developed sudden onset of palpitations. This was associated with presyncope and dyspnea as well as chest tightness. The symptoms resolved when she converted to sinus rhythm. She's had no problems since then. This was her first episode of palpitations. In the emergency room she was found to be in atrial fibrillation which converted to sinus rhythm. She has no history of diabetes, hypertension, prior CVA or congestive heart failure.  Current Medications (verified): 1)  Bayer Aspirin 325 Mg Tabs (Aspirin) .... Take 1 Tablet By Mouth Once A Day 2)  Crestor 20 Mg Tabs (Rosuvastatin Calcium) .... 2 Tabs By Mouth Daily 3)  Levothyroxine Sodium 125 Mcg Tabs (Levothyroxine Sodium) .... Take 1 Tablet By Mouth Once A Day 4)  Tenormin 25 Mg  Tabs (Atenolol) .Marland Kitchen.. 1 Tab By Mouth Daily 5)  Naprosyn 500 Mg Tabs (Naproxen) .Marland Kitchen.. 1 Tab By Mouth Two Times A Day For One Week As Needed Pain 6)  Claritin 10 Mg Tabs (Loratadine) .... Take 1 Pill Daily For Itching  Allergies: No Known Drug Allergies  Past History:  Past Medical History: Reviewed history from 04/29/2009 and no changes required. CAD (s/p PCI records pending) Hypothyroidism Hyperlipidemia Endometrial hyperplasia  Past Surgical History: Reviewed history from 03/15/2008 and no changes required. BTL  Social  History: Reviewed history from 02/23/2010 and no changes required. 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 Remaining systems are negative.   Vital Signs:  Patient profile:   60 year old female Height:      63 inches Weight:      180 pounds BMI:     32.00 Pulse rate:   60 / minute Resp:     14 per minute BP sitting:   131 / 75  (left arm)  Vitals Entered By: Kem Parkinson (Apr 29, 2010 8:59 AM)  Physical Exam  General:  Well-developed well-nourished in no acute distress.  Skin is warm and dry.  HEENT is normal.  Neck is supple. No thyromegaly.  Chest is clear to auscultation with normal expansion.  Cardiovascular exam is regular rate and rhythm.  Abdominal exam nontender or distended. No masses palpated. Extremities show no edema. neuro grossly intact    EKG  Procedure date:  04/27/2010  Findings:      Atrial fibrillation at a rate of 130. Axis normal. No ST changes.  EKG  Procedure date:  04/27/2010  Findings:      Sinus rhythm at a rate of 87. No ST changes.  Impression & Recommendations:  Problem # 1:  ATRIAL FIBRILLATION (ICD-427.31) The patient has documented paroxysmal atrial fibrillation. She did convert spontaneously to sinus rhythm. She has no embolic risk factors other than female sex. She will continue on her aspirin at 325 mg p.o. daily. I will increase her atenolol to 50 mg p.o. daily. I will schedule an echocardiogram to quantify LV function. I will check a TSH. Her updated medication list for this problem includes:    Bayer Aspirin 325 Mg Tabs (Aspirin)  .Marland Kitchen... Take 1 tablet by mouth once a day    Atenolol 50 Mg Tabs (Atenolol) .Marland Kitchen... Take one tablet by mouth daily  Orders: Echocardiogram (Echo) TLB-TSH (Thyroid Stimulating Hormone) (84443-TSH)  Problem # 2:  CAD (ICD-414.00) She did have chest tightness with the atrial fibrillation but typically does not have exertional chest pain. Previous Myoview was normal. No further ischemia evaluation. Continue aspirin, beta blocker and statin. Her updated medication list for this problem includes:    Bayer Aspirin 325 Mg Tabs (Aspirin) .Marland Kitchen... Take 1 tablet by mouth once a day    Atenolol 50 Mg Tabs (Atenolol) .Marland Kitchen... Take one tablet by mouth daily  Problem # 3:  HYPERLIPIDEMIA (ICD-272.4) Continue statin. Lipids and liver monitored by primary care. Her updated medication list for this problem includes:    Crestor 20 Mg Tabs (Rosuvastatin calcium) .Marland Kitchen... 2 tabs by mouth daily  Problem # 4:  HYPOTHYROIDISM, UNSPECIFIED (ICD-244.9) Check TSH. Her updated medication list for this problem includes:    Levothyroxine Sodium 125 Mcg Tabs (Levothyroxine sodium) .Marland Kitchen... Take 1 tablet by mouth once a day  Patient Instructions: 1)  Your physician recommends that you schedule a follow-up appointment in: 6 WEEKS WITH DR [facility] 2)  Your physician has recommended you make the following change in your medication: INCREASE TENORMIN 50MG  ONCE DAILY 3)  Your physician has requested that you have an echocardiogram.  Echocardiography is a painless test that uses sound waves to create images of your heart. It provides your doctor with information about the size and shape of your heart and how well your heart's chambers and valves are working.  This procedure takes approximately one hour. There are no restrictions for this procedure. Prescriptions: ATENOLOL 50 MG TABS (ATENOLOL) Take one tablet by mouth daily  #30 x 12   Entered by:   Deliah Goody, RN   Authorized by:   Ferman Hamming, MD, [facility]   Signed by:   Deliah Goody, RN on 04/29/2010   Method used:   Electronically to        [facility] [phone]* (retail)       [address]       [place], [place]  [address]       Ph: [phone]       Fax: [phone]   RxID:   [id number]

## 2011-01-26 NOTE — Assessment & Plan Note (Signed)
Summary: hip pain, hyperlipidemia, htn, afib/patient summary   Vital Signs:  Patient profile:   60 year old female Height:      63 inches Weight:      179.38 pounds BMI:     31.89 Temp:     97.9 degrees F oral Pulse rate:   83 / minute BP sitting:   113 / 67  (left arm)  Vitals Entered By: Terese Door (June 01, 2010 3:47 PM) CC: allergies, afib, hld  Is Patient Diabetic? No Pain Assessment Patient in pain? yes     Location: thigh Intensity: 2   Primary Care Provider:  Dr. Lafonda Mosses  CC:  allergies, afib, and hld .  History of Present Illness: 1.  hearing--has been having rhinnorhea.  ears feel full.  occ cough.  no congestion.  rhinnorhea bothering her one month, but ears about 1 week.  been taking claritin, but that is not controlling it.  no fever  2.  a fib--had an episode of paroxysmal afib that spontaneously converted to nsr.  saw Dr. Antoine Poche.  he increased atenolol and started her on aspirin.  she has not had any further episodes of heart racing or chest pain or sob since then  3.  hyperlipidemia--due for ldl check.  on crestor 40mg   4.  left hip pain--just below left hip and in left groin.  currently taking naproxen.  works for about 6 hours, then pain resumes.  no back pain.  no saddle anesthesia.  right sometimes hurts too, but not nearly as bad.  stands alot at work.  works in a Clinical research associate.    Habits & Providers  Alcohol-Tobacco-Diet     Tobacco Status: quit > 6 months  Current Medications (verified): 1)  Bayer Aspirin 325 Mg Tabs (Aspirin) .... Take 1 Tablet By Mouth Once A Day 2)  Crestor 20 Mg Tabs (Rosuvastatin Calcium) .... 2 Tabs By Mouth Daily 3)  Levothyroxine Sodium 125 Mcg Tabs (Levothyroxine Sodium) .... Take 1 Tablet By Mouth Once A Day 4)  Atenolol 50 Mg Tabs (Atenolol) .... Take One Tablet By Mouth Daily 5)  Naprosyn 500 Mg Tabs (Naproxen) .Marland Kitchen.. 1 Tab By Mouth Two Times A Day For One Week As Needed Pain 6)  Claritin 10 Mg Tabs (Loratadine) .... Take  1 Pill Daily For Itching  Allergies: No Known Drug Allergies  Social History: Smoking Status:  quit > 6 months  Physical Exam  General:  NAD, obese vital sings noted and wnl Eyes:  watery; otherwise normal appearance Ears:  left canal blocked by dried cerumen.  right tm somewhat opaque, but otherwise normal Nose:  mild mucosal edema, erythema Mouth:  o/p without significant erythema Neck:  mild anterior lymphadenopathy  Lungs:  Normal respiratory effort, chest expands symmetrically. Lungs are clear to auscultation, no crackles or wheezes. Heart:  Normal rate and regular rhythm. S1 and S2 normal without gallop, murmur, click, rub or other extra sounds. Msk:  back:  no ttp over l-spine, SI joint or sciatic notch.  can squat about 50% of the way down  gait: limp favoring right leg  left hip:  tender over trochanteric bursa.  full rom left hip.  pain with internal, but not external rotation   Impression & Recommendations:  Problem # 1:  HIP PAIN, BILATERAL (ICD-719.45) think she may have 2 processes:  arthritis and trochanteric bursitis.  offered bursa injection, but she wants to think about it.  will get hip films to assess for osteoarthritis.  continue diclofenac.  follow up one month, sooner if she decides she wants injection Her updated medication list for this problem includes:    Bayer Aspirin 325 Mg Tabs (Aspirin) .Marland Kitchen... Take 1 tablet by mouth once a day    Naprosyn 500 Mg Tabs (Naproxen) .Marland Kitchen... 1 tab by mouth two times a day for one week as needed pain  Orders: Radiology other (Radiology Other) [facility]- Est  Level 4 (884[address])  Problem # 2:  CAD (ICD-414.00) Assessment: Unchanged per dr hochrein (cards) Her updated medication list for this problem includes:    Bayer Aspirin 325 Mg Tabs (Aspirin) .Marland Kitchen... Take 1 tablet by mouth once a day    Atenolol 50 Mg Tabs (Atenolol) .Marland Kitchen... Take one tablet by mouth daily  Problem # 3:  MYOCARDIAL INFARCTION, OLD (ICD-412) Assessment:  Unchanged per dr hochrein  (cards) Her updated medication list for this problem includes:    Bayer Aspirin 325 Mg Tabs (Aspirin) .Marland Kitchen... Take 1 tablet by mouth once a day    Atenolol 50 Mg Tabs (Atenolol) .Marland Kitchen... Take one tablet by mouth daily  Problem # 4:  ATRIAL FIBRILLATION (ICD-427.31) Assessment: Unchanged contnue asa and atenolol.  mgmt per drhochrein Her updated medication list for this problem includes:    Bayer Aspirin 325 Mg Tabs (Aspirin) .Marland Kitchen... Take 1 tablet by mouth once a day    Atenolol 50 Mg Tabs (Atenolol) .Marland Kitchen... Take one tablet by mouth daily  Problem # 5:  HYPERLIPIDEMIA (ICD-272.4) Assessment: Unchanged  on max dose crestor.  goal is definitely under 100, preferably <70.  check ldl today.  consider nutritional counseling if >100 Her updated medication list for this problem includes:    Crestor 20 Mg Tabs (Rosuvastatin calcium) .Marland Kitchen... 2 tabs by mouth daily  Orders: [facility]- Est  Level 4 ([address])  Problem # 6:  ESSENTIAL HYPERTENSION, BENIGN (ICD-401.1) Assessment: Unchanged  great control Her updated medication list for this problem includes:    Atenolol 50 Mg Tabs (Atenolol) .Marland Kitchen... Take one tablet by mouth daily  Orders: [facility]- Est  Level 4 ([address]010)  Problem # 7:  HYPOTHYROIDISM, UNSPECIFIED (ICD-244.9) Assessment: Unchanged tsh normal 5/11 Her updated medication list for this problem includes:    Levothyroxine Sodium 125 Mcg Tabs (Levothyroxine sodium) .Marland Kitchen... Take 1 tablet by mouth once a day  Orders: [facility]- Est  Level 4 ([address]35)  Complete Medication List: 1)  Bayer Aspirin 325 Mg Tabs (Aspirin) .... Take 1 tablet by mouth once a day 2)  Crestor 20 Mg Tabs (Rosuvastatin calcium) .... 2 tabs by mouth daily 3)  Levothyroxine Sodium 125 Mcg Tabs (Levothyroxine sodium) .... Take 1 tablet by mouth once a day 4)  Atenolol 50 Mg Tabs (Atenolol) .... Take one tablet by mouth daily 5)  Naprosyn 500 Mg Tabs (Naproxen) .Marland Kitchen.. 1 tab by mouth two times a day for one week as needed  pain 6)  Claritin 10 Mg Tabs (Loratadine) .... Take 1 pill daily for itching 7)  Fluticasone Propionate 50 Mcg/act Susp (Fluticasone propionate) .... 2 sprays in each nostril daily for allergies  Other Orders: T-Lipoprotein (LDL cholesterol)  (57322-02542) T-Comprehensive Metabolic Panel ([address]23-76283)  Patient Instructions: 1)  It was nice to see you today. 2)  I will call you if your labs are not normal.   3)  We will set you up to get x-rays.  I will call you when I get the results. 4)  Blood pressure is great.   5)  For your allergies, start taking fluticasone.  2 sprays in each  nostril daily.   6)  Please schedule a follow-up appointment in 1 month for your hips.  Prescriptions: FLUTICASONE PROPIONATE 50 MCG/ACT SUSP (FLUTICASONE PROPIONATE) 2 sprays in each nostril daily for allergies  #1 x 6   Entered and Authorized by:   Asher Muir MD   Signed by:   Asher Muir MD on 06/01/2010   Method used:   Electronically to        [facility] [phone]* (retail)       [address]       [place], [place]  [address]       Ph: [phone]       Fax: [phone]   RxID:   [id number] LEVOTHYROXINE SODIUM 125 MCG TABS (LEVOTHYROXINE SODIUM) Take 1 tablet by mouth once a day  #30 x 6   Entered and Authorized by:   Asher Muir MD   Signed by:   Asher Muir MD on 06/01/2010   Method used:   Electronically to        [facility] [phone]* (retail)       [address]       [place], [place]  [address]       Ph: [phone]       Fax: [phone]   RxID:   [id number] CRESTOR 20 MG TABS (ROSUVASTATIN CALCIUM) 2 tabs by mouth daily  #60 x 6   Entered and Authorized by:   Asher Muir MD   Signed by:   Asher Muir MD on 06/01/2010   Method used:   Electronically to        [facility] [phone]* (retail)       [address]       [place], [place]  [address]       Ph: [phone]       Fax: [phone]   RxID:    [id number]   Prevention & Chronic Care Immunizations   Influenza vaccine: Fluvax Non-MCR  (10/17/2009)   Influenza vaccine due: 10/25/2008    Tetanus booster: 06/26/2000: Done.   Tetanus booster due: 06/26/2010    Pneumococcal vaccine: Not documented    H. zoster vaccine: Not documented  Colorectal Screening   Hemoccult: Done.  (12/27/2006)   Hemoccult due: Not Indicated    Colonoscopy: hyperplastic polyps, no malignancy.  (11/11/2004)   Colonoscopy due: 11/11/2014  Other Screening   Pap smear: NEGATIVE FOR INTRAEPITHELIAL LESIONS OR MALIGNANCY.  (09/13/2008)   Pap smear due: 09/14/2011    Mammogram: Done.  (07/27/2006)   Mammogram due: 07/28/2007    DXA bone density scan: Not documented   Smoking status: quit > 6 months  (06/01/2010)  Lipids   Total Cholesterol: 197  (06/10/2009)   Lipid panel action/deferral: LDL Direct Ordered   LDL: 938  (06/10/2009)   LDL Direct: Not documented   HDL: 57  (06/10/2009)   Triglycerides: 102  (06/10/2009)    SGOT (AST): 22  (09/13/2008)   BMP action: Ordered   SGPT (ALT): 22  (09/13/2008) CMP ordered    Alkaline phosphatase: 65  (09/13/2008)   Total bilirubin: 0.6  (09/13/2008)    Lipid flowsheet reviewed?: Yes   Progress toward LDL goal: Unchanged    Stage of readiness to change (lipid management): Action  Hypertension   Last Blood Pressure: 113 / 67  (06/01/2010)   Serum creatinine: 0.93  (10/17/2009)   BMP action: Ordered   Serum potassium 4.1  (10/17/2009) CMP ordered     Hypertension flowsheet reviewed?: Yes  Progress toward BP goal: At goal  Self-Management Support :    Hypertension self-management support: Not documented    Lipid self-management support: Lipid monitoring log, Written self-care plan  (06/01/2010)   Lipid self-care plan printed.   Appended Document: hip pain, hyperlipidemia, htn, afib/patient summary see also, phone note from 6/22 for additional pt summary

## 2011-01-26 NOTE — Letter (Signed)
Summary: Lipid Letter  [facility]  [address]   [place], [place] [address]   Phone: [phone]  Fax: [phone]    06/11/2009  [name] [address] [place], [place]  [address]  Dear [name]:  We have carefully reviewed your last lipid profile from 06/10/2009 and the results are noted below with a summary of recommendations for lipid management.    Cholesterol:       197     Goal: <200   HDL "good" Cholesterol:   57     Goal: >50   LDL "bad" Cholesterol:   120     Goal: <100   Triglycerides:       102     Goal: <180    YOUR "BAD" CHOLESTEROL IS MUCH HIGHER THAN LAST YEAR.  PLEASE TAKE 2 CRESTOR PILLS FOR THE NEXT 3 MONTHS.  THEN WE WILL CHECK AGAIN.  I HAVE SENT IN A PRESCRIPTION TO [facility] ON [address].  THE PHONE NUMBER WE HAD FOR YOU ISN'T WORKING SO I AM SENDING THIS LETTER.    TLC Diet (Therapeutic Lifestyle Change): Saturated Fats & Transfatty acids should be kept < 7% of total calories ***Reduce Saturated Fats Polyunstaurated Fat can be up to 10% of total calories Monounsaturated Fat Fat can be up to 20% of total calories Total Fat should be no greater than 25-35% of total calories Carbohydrates should be 50-60% of total calories Protein should be approximately 15% of total calories Fiber should be at least 20-30 grams a day ***Increased fiber may help lower LDL Total Cholesterol should be < 200mg /day Consider adding plant stanol/sterols to diet (example: Benacol spread) ***A higher intake of unsaturated fat may reduce Triglycerides and Increase HDL    Adjunctive Measures (may lower LIPIDS and reduce risk of Heart Attack) include: Aerobic Exercise (20-30 minutes 3-4 times a week) Limit Alcohol Consumption Weight Reduction Aspirin 75-81 mg a day by mouth (if not allergic or contraindicated) Dietary Fiber 20-30 grams a day by mouth     Current Medications: 1)    Bayer Aspirin 325 Mg Tabs (Aspirin) .... Take 1 tablet by mouth once a  day 2)    Crestor 20 Mg Tabs (Rosuvastatin calcium) .... 2 tabs by mouth daily 3)    Levothyroxine Sodium 125 Mcg Tabs (Levothyroxine sodium) .... Take 1 tablet by mouth once a day 4)    Tenormin 25 Mg  Tabs (Atenolol) .Marland Kitchen.. 1 tab by mouth daily 5)    Zyrtec Allergy 10 Mg  Tabs (Cetirizine hcl) .Marland Kitchen.. 1 tab po once daily  If you have any questions, please call. We appreciate being able to work with you.   Sincerely,    [facility] Johney Maine MD  Appended Document: Lipid Letter Mailed.

## 2011-01-26 NOTE — Miscellaneous (Signed)
Summary: Form for work  Patient left form to be filled out with lab results.  Please call her when ready to be picked up. KATHY HARRELSON  June 06, 2008 9:29 AM form at front for pt to pick up. phone number not working.[address] RN  June 06, 2008 1:38 PM

## 2011-01-26 NOTE — Assessment & Plan Note (Signed)
Summary: 6wk f/u sl pt of dr Tuleen Mandelbaum last seen by him 10-10/sl   Visit Type:  Follow-up Primary Provider:  Dr. Lafonda Mosses  CC:  Atrial Fibrillation.  History of Present Illness: The patient presents for followup of paroxysmal atrial fibrillation. She was seen as an antibiotic Dr. Jens Som for management of this. She was encouraged to take adult dose aspirin as well as increase her atenolol. Since that time she has had no further paroxysms. She has had no chest pressure, neck or arm discomfort. She has had no palpitations, presyncope or syncope. She has had no PND or orthopnea. Her blood pressure at home has been well-controlled with a systolic in the 120s. She's not overly active in that she does get some leg discomfort she relates to chronic back pain.   Current Medications (verified): 1)  Bayer Aspirin 325 Mg Tabs (Aspirin) .... Take 1 Tablet By Mouth Once A Day 2)  Crestor 20 Mg Tabs (Rosuvastatin Calcium) .... 2 Tabs By Mouth Daily 3)  Levothyroxine Sodium 125 Mcg Tabs (Levothyroxine Sodium) .... Take 1 Tablet By Mouth Once A Day 4)  Atenolol 50 Mg Tabs (Atenolol) .... Take One Tablet By Mouth Daily 5)  Naprosyn 500 Mg Tabs (Naproxen) .Marland Kitchen.. 1 Tab By Mouth Two Times A Day For One Week As Needed Pain 6)  Claritin 10 Mg Tabs (Loratadine) .... Take 1 Pill Daily For Itching  Allergies (verified): No Known Drug Allergies  Past History:  Past Medical History: CAD (ICD-414.00) MYOCARDIAL INFARCTION, OLD (ICD-412) HEALTH MAINTENANCE EXAM (ICD-V70.0) SCABIES (ICD-133.0) LEG PAIN, LEFT (ICD-729.5) DIZZINESS (ICD-780.4) HYPERLIPIDEMIA (ICD-272.4) ESSENTIAL HYPERTENSION, BENIGN (ICD-401.1) OBESITY, UNSPECIFIED (ICD-278.00) PREOPERATIVE EXAMINATION (ICD-V72.84) ENDOMETRIAL HYPERPLASIA UNSPECIFIED (ICD-621.30) ENDOMETRIAL HYPERPLASIA UNSPECIFIED (ICD-621.30) HIP PAIN, BILATERAL (ICD-719.45) ONYCHOMYCOSIS, TOENAILS (ICD-110.1) HYPOTHYROIDISM, UNSPECIFIED (ICD-244.9) HYPERCHOLESTEROLEMIA  (ICD-272.0) ESOPHAGITIS, UNSPECIFIED (ICD-530.10) BACK PAIN, LOW (ICD-724.2)  Past Surgical History: Reviewed history from 03/15/2008 and no changes required. BTL  Review of Systems       As stated in the HPI and negative for all other systems.   Vital Signs:  Patient profile:   60 year old female Height:      63 inches Weight:      180 pounds BMI:     32.00 Pulse rate:   63 / minute Resp:     16 per minute BP sitting:   148 / 67  (right arm)  Vitals Entered By: Marrion Coy, CNA (June 01, 2010 12:08 PM)  Physical Exam  General:  Well developed, well nourished, in no acute distress. Head:  normocephalic and atraumatic Neck:  Neck supple, no JVD. No masses, thyromegaly or abnormal cervical nodes. Chest Wall:  no deformities or breast masses noted Lungs:  Clear bilaterally to auscultation and percussion. Heart:  Non-displaced PMI, chest non-tender; regular rate and rhythm, S1, S2 without murmurs, rubs or gallops. Carotid upstroke normal, no bruit. Normal abdominal aortic size, no bruits. Femorals normal pulses, no bruits. Pedals normal pulses. No edema, no varicosities. Abdomen:  Bowel sounds positive; abdomen soft and non-tender without masses, organomegaly, or hernias noted. No hepatosplenomegaly. Msk:  Back normal, normal gait. Muscle strength and tone normal. Extremities:  No clubbing or cyanosis. Neurologic:  Alert and oriented x 3. Skin:  Intact without lesions or rashes. Psych:  Normal affect.   Impression & Recommendations:  Problem # 1:  ATRIAL FIBRILLATION (ICD-427.31) The patient has had no further paroxysms of this. No further cardiovascular testing is suggested. She will let me know if she has any further symptomatic tachypalpitations.  Problem # 2:  CAD (ICD-414.00) She has had no further chest discomfort.no further testing is suggested. She will continue with risk reduction.  Problem # 3:  ESSENTIAL HYPERTENSION, BENIGN (ICD-401.1) Her blood pressure is  slightly elevated here but it is well controlled at home.  I will make no change to her medical regimen..  Patient Instructions: 1)  Your physician recommends that you schedule a follow-up appointment in: 1 yr with Dr Antoine Poche 2)  Your physician recommends that you continue on your current medications as directed. Please refer to the Current Medication list given to you today.

## 2011-01-26 NOTE — Consult Note (Signed)
Summary: [facility]  [facility]   Imported By: Clydell Hakim 09/14/2010 15:53:20  _____________________________________________________________________  External Attachment:    Type:   Image     Comment:   External Document

## 2011-01-26 NOTE — Progress Notes (Signed)
Summary: triage  Phone Note Call from Patient Call back at [phone]   Caller: Patient Summary of Call: left leg is hurting bad - feels like she needs to come in today (see previous note) Initial call taken by: De Nurse,  October 17, 2009 8:41 AM  Follow-up for Phone Call        she has seen md for this before. it is getting worse. appt at 10 with Dr. Lafonda Mosses Follow-up by: Golden Circle RN,  October 17, 2009 8:50 AM  Additional Follow-up for Phone Call Additional follow up Details #1::        as stated on previous note I used the phone number at the top of general log in page for all patients and i could not access her.  my apologies for not knowing the number and potentially missing the number in mrs denise's note on yesterday.   Additional Follow-up by: Magnus Ivan MD,  October 17, 2009 11:46 AM

## 2011-01-26 NOTE — Consult Note (Signed)
Summary: [facility]  [facility]   Imported By: Clydell Hakim 10/07/2010 09:21:32  _____________________________________________________________________  External Attachment:    Type:   Image     Comment:   External Document

## 2011-01-26 NOTE — Assessment & Plan Note (Signed)
Summary: f/u,df   Vital Signs:  Patient profile:   60 year old female Height:      63 inches Weight:      182.3 pounds Temp:     98.1 degrees F Pulse rate:   110 / minute BP sitting:   124 / 75  (right arm) CC: Follow up Left leg pain from hip to ankle Is Patient Diabetic? No Pain Assessment Patient in pain? no        Primary Care Shamarcus Hoheisel:  Magnus Ivan MD  CC:  Follow up Left leg pain from hip to ankle.  History of Present Illness: f/u leg pain: saw me at prior appointment, felt better and then had pain flare up, unknown etiology of new flare up.  seen by dr Lafonda Mosses and given naproxen and cyclobenzaprine.  hasn't taken medication in last couple of days and pt been able to work s/p stopping medication.  will continue with current regimen if having any more flareups.  no h/o back surgery.  Habits & Providers  Alcohol-Tobacco-Diet     Tobacco Status: quit  Allergies: No Known Drug Allergies  Family History: Father-MI at 65 Mother-DM, heart problems, HTN Brothers- intestinal and throat cancer Older brother-bladder tumors (not malignant)  Social History: Smoking Status:  quit  Review of Systems       pt denies foot drop, weakness, back pain  Physical Exam  General:  NAD, overweight vital signs noted and wnl except pt slightly tachycardic Msk:  tenderness to palpation on L gluteus that radiates to lateral knee 5/5 lower extremity strength Root testing negative L straight leg raise to 50 degrees with pain in thigh R straight leg raise to 70 degrees without pain    Detailed Back/Spine Exam  Lumbosacral Exam:  Range of Motion:    Forward Flexion:   60 degrees    Hyperextension:   35 degrees   Hip Exam  Gait:    antalgic.     Knee Exam  Reflexes:    2/2 knee reflexes   Impression & Recommendations:  Problem # 1:  LEG PAIN, LEFT (ICD-729.5) Assessment Improved Patient is improving.  Not going to change patient's current pain  medication regimen and will use meds in the future if having flare ups.  Made patient aware of things to look for that are red flags.  Orders: [facility]- Est Level  2 ([address])  Complete Medication List: 1)  Bayer Aspirin 325 Mg Tabs (Aspirin) .... Take 1 tablet by mouth once a day 2)  Crestor 20 Mg Tabs (Rosuvastatin calcium) .... 2 tabs by mouth daily 3)  Levothyroxine Sodium 125 Mcg Tabs (Levothyroxine sodium) .... Take 1 tablet by mouth once a day 4)  Tenormin 25 Mg Tabs (Atenolol) .Marland Kitchen.. 1 tab by mouth daily 5)  Zyrtec Allergy 10 Mg Tabs (Cetirizine hcl) .Marland Kitchen.. 1 tab po once daily 6)  Cyclobenzaprine Hcl 5 Mg Tabs (Cyclobenzaprine hcl) .Marland Kitchen.. 1 tablet by mouth three times a day as needed pain 7)  Naprosyn 500 Mg Tabs (Naproxen) .Marland Kitchen.. 1 tab by mouth two times a day as needed pain  Patient Instructions: 1)  I'm glad that you are improving!  Please look out for any signs of weakness, lossing your urine or worsening of your pain or anything out of the ordinary come back to see [place]! 2)  Thank you and be blessed! Prescriptions: TENORMIN 25 MG  TABS (ATENOLOL) 1 tab by mouth daily  #31 x 2   Entered and Authorized by:  Magnus Ivan MD   Signed by:   Magnus Ivan MD on 10/23/2009   Method used:   Electronically to        [facility] [phone]* (retail)       [address]       [place], [place]  [address]       Ph: [phone]       Fax: [phone]   RxID:   [id number] LEVOTHYROXINE SODIUM 125 MCG TABS (LEVOTHYROXINE SODIUM) Take 1 tablet by mouth once a day  #30 x 6   Entered and Authorized by:   Magnus Ivan MD   Signed by:   Magnus Ivan MD on 10/23/2009   Method used:   Electronically to        [facility] [phone]* (retail)       [address]       [place], [place]  [address]       Ph: [phone]       Fax: [phone]   RxID:   [id number]

## 2011-01-26 NOTE — Miscellaneous (Signed)
Summary: prior auth  Clinical Lists Changes prior auth for crestor faxed to [facility] RN  July 04, 2009 4:54 PM

## 2011-01-26 NOTE — Progress Notes (Signed)
Summary: triage  Phone Note Call from Patient Call back at [phone]   Caller: Patient Summary of Call: calling to see if she can be seen today.  Feeling light headed feels like she is going to pass out. Initial call taken by: Clydell Hakim,  September 10, 2009 10:40 AM  Follow-up for Phone Call        Returned call, no answer.  LVMM for her to call us back. Follow-up by: Dennison Nancy RN,  September 10, 2009 10:50 AM  Additional Follow-up for Phone Call Additional follow up Details #1::        still lightheaded. was asleep when other nurse called. this has happened before & it was an inner ear infection. appt at 1:30 for a work in appt. she is aware of wait. states she can have her husband bring her if she feels unsafe driving Additional Follow-up by: Golden Circle RN,  September 10, 2009 11:09 AM

## 2011-01-26 NOTE — Letter (Signed)
Summary: Generic Letter  [facility]  [address]   [place], [place] [address]   Phone: [phone]  Fax: [phone]    06/04/2010  [name] [address] [place], [place]  [address]  Dear [name],   Please call me at [phone] when you have a free moment to discuss your cholesterol results.  Your cholesterol is still high, and I would like to discuss options with you.        Sincerely,   Asher Muir MD  Appended Document: Generic Letter mailed.

## 2011-01-26 NOTE — Progress Notes (Signed)
Summary: returning call  Phone Note Call from Patient Call back at [phone]   Caller: Patient Reason for Call: Talk to Nurse Summary of Call: returning call Initial call taken by: Migdalia Dk,  May 27, 2010 10:56 AM  Follow-up for Phone Call        PT AWARE OF ECHO RESULTS. Follow-up by: Scherrie Bateman, LPN,  May 27, 4781 12:36 PM

## 2011-01-26 NOTE — Assessment & Plan Note (Signed)
Summary: cpp/eo   Vital Signs:  Patient Profile:   60 Years Old Female Height:     63.5 inches Weight:      187 pounds Pulse rate:   51 / minute BP sitting:   124 / 73  Vitals Entered By: Lillia Pauls CMA (September 13, 2008 9:47 AM)                 PCP:  Johney Maine MD  Chief Complaint:  CHECK-UP.  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 with urgent RCA PTCA, good LV fctn       MYOCARDIAL INFARCTION, OLD (ICD-412)       HYPOTHYROIDISM, UNSPECIFIED (ICD-244.9)       HYPERCHOLESTEROLEMIA (ICD-272.0)       ESOPHAGITIS, UNSPECIFIED (ICD-530.10)       DEPRESSION, MAJOR, RECURRENT (ICD-296.30)       CARPAL TUNNEL SYNDROME (ICD-354.0)       BACK PAIN, LOW (ICD-724.2)         Past Surgical History:    Reviewed history from 03/15/2008 and no changes required:       BTL   Family History:    Reviewed history from 09/04/2007 and no changes required:       Father-MI at 66       Mother-DM, heart problems, HTN       Brothers- intestinal and throat cancer       Older brother-bladder tumors (not malignant)  Social History:    Reviewed history from 09/04/2007 and no changes required:       lives with husband and their granddaughter of whom they have coustody.  Grandaugther is Chief Technology Officer (my patient).  Pt quit smoking 1990, denies EtOH, drugs.       Is now working at [employer] at [employer] ([employer])   Risk Factors:  Mammogram History:     Date of Last Mammogram:  07/27/2006    Review of Systems  Eyes      Denies blurring.  ENT      Denies sore throat.  CV      Denies chest pain or discomfort, difficulty breathing at night, difficulty breathing while lying down,  shortness of breath with exertion, and swelling of feet.  Resp      Denies cough.  GI      Denies abdominal pain, bloody stools, constipation, nausea, and vomiting.  GU      Complains of discharge.      white, fishy odor.   MS      Complains of low back pain.      achiness and heaviness at night of legs.  sharp  pain down left leg, worse at night  Derm      Denies rash.  Neuro      Denies numbness.  Psych      Complains of depression.      increased family stress.  denies SI  Heme      Denies abnormal bruising.   Physical Exam  General:     Well-developed,well-nourished,in no acute distress; alert,appropriate and cooperative throughout examination Head:  Normocephalic and atraumatic without obvious abnormalities. No apparent alopecia or balding. Eyes:     PERRLA EOMI Ears:     External ear exam shows no significant lesions or deformities.  Otoscopic examination reveals clear canals, tympanic membranes are intact bilaterally without bulging, retraction, inflammation or discharge. Hearing is grossly normal bilaterally. Nose:     External nasal examination shows no deformity or inflammation. Nasal mucosa are pink and moist without lesions or exudates. Mouth:     Oral mucosa and oropharynx without lesions or exudates.  Teeth in good repair. Breasts:     No mass, nodules, thickening, tenderness, bulging, retraction, inflamation, nipple discharge or skin changes noted.   Lungs:     Normal respiratory effort, chest expands symmetrically. Lungs are clear to auscultation, no crackles or wheezes. Heart:     Regular rate and rhythm.  No murmur, rub, or gallop.  2+ Dorsalis Pedis pulses.  Abdomen:     Bowel sounds positive,abdomen soft and non-tender without masses, organomegaly or hernias noted. Genitalia:     no external lesions white vaginal d/c w/ fishy odor present cervix is friable.  no vaginal atrophy  Uterus feels enlarge.  R ovary feels enlarged. Msk:      Neg straight leg raise Pain at L hip w/ external rotation full hip strength equal and B FROM  Non tender spinal exam Pulses:     normal radial, dp pulses B Extremities:     No clubbing, cyanosis, edema, or deformity noted with normal full range of motion of all joints.   Neurologic:     No cranial nerve deficits noted. Station and gait are normal. Plantar reflexes are down-going bilaterally. DTRs are symmetrical throughout. Sensory, motor and coordinative functions appear intact. Skin:     Intact without suspicious lesions or rashes Psych:     Oriented X3, memory intact for recent and remote, and normally interactive.      Impression & Recommendations:  Problem # 1:  ONYCHOMYCOSIS, TOENAILS (ICD-110.1) Assessment: Improved finished course of terbinifine.  will check LFTs Her updated medication list for this problem includes:    Terbinafine Hcl 250 Mg Tabs (Terbinafine hcl) .Marland Kitchen... 1 tab by mouth daily for 12 weeks   Problem # 2:  DEPRESSION, MAJOR, RECURRENT (ICD-296.30) Assessment: Deteriorated desires meds.  cymbalta was effective in past.  will restart  Problem # 3:  HIP PAIN, BILATERAL (ICD-719.45) Assessment: New gave exercises for strengthening.  can use tylenol.  avoid NSAIDS due to h/o MI Her updated medication list for this problem includes:    Bayer Aspirin 325 Mg Tabs (Aspirin) .Marland Kitchen... Take 1 tablet by mouth once a day    Flexeril 5 Mg Tabs (Cyclobenzaprine hcl) .Marland Kitchen... 1 tablet by mouth at bedtime    Vicodin 5-500 Mg Tabs (Hydrocodone-acetaminophen) .Marland Kitchen... 1-2 tabs by mouth every 6 hours as needed for pain   Problem # 4:  HEALTH MAINTENANCE EXAM (ICD-V70.0) Assessment: Unchanged up to date on screening.  States she had normal mammogram last year.  will need ot clarify Orders: [facility] - Est  40-64 yrs ([address])   Complete Medication List: 1)  Bayer Aspirin 325 Mg Tabs (Aspirin) .... Take 1 tablet by mouth once a day 2)  Crestor 20 Mg Tabs (Rosuvastatin calcium) ....  Take 1 tablet by mouth once a day 3)  Levothyroxine Sodium 125 Mcg Tabs (Levothyroxine sodium) .... Take 1 tablet by mouth once a day 4)  Flexeril 5 Mg Tabs (Cyclobenzaprine hcl) .Marland Kitchen.. 1 tablet by mouth at bedtime  5)  Tenormin 25 Mg Tabs (Atenolol) .Marland Kitchen.. 1 tab by mouth daily 6)  Gabapentin 300 Mg Caps (Gabapentin) .Marland Kitchen.. 1 tab by mouth tid 7)  Vicodin 5-500 Mg Tabs (Hydrocodone-acetaminophen) .Marland Kitchen.. 1-2 tabs by mouth every 6 hours as needed for pain 8)  Zyrtec Allergy 10 Mg Tabs (Cetirizine hcl) .Marland Kitchen.. 1 tab po once daily 9)  Terbinafine Hcl 250 Mg Tabs (Terbinafine hcl) .Marland Kitchen.. 1 tab by mouth daily for 12 weeks 10)  Citalopram Hydrobromide 20 Mg Tabs (Citalopram hydrobromide) .Marland Kitchen.. 1 tab by mouth daily 11)  Metronidazole 500 Mg Tabs (Metronidazole) .Marland Kitchen.. 1 tab by mouth two times a day for a week  Other Orders: Pap Smear-[facility] ([id number]) Comp Met-[facility] ([id number]) Ultrasound (Ultrasound)  Last Flex Sig:  Done. (02/24/2001 12:00:00 AM) Flex Sig Next Due:  Not Indicated Last Hemoccult Result: Done. (12/27/2006 12:00:00 AM) Hemoccult Next Due:  Not Indicated   Patient Instructions: 1)  Please schedule a follow-up appointment in 6 months. 2)  START CITOLAPRAM ONE PILL A DAY FOR DEPRESSIN 3)  TAKE METRONIDAZOLE two times a day FOR VAGINAL INFECTION 4)  TRY THE HIP EXERCISIES 5)  ULTRASOUND AT [facility] ON TUES SEPT 22ND AT 2:30PM. [address] LOCATION. MSSG LEFT FOR PT.    Prescriptions: METRONIDAZOLE 500 MG TABS (METRONIDAZOLE) 1 tab by mouth two times a day for a week  #14 x 0   Entered and Authorized by:   Johney Maine MD   Signed by:   Johney Maine MD on 09/13/2008   Method used:   Electronically to        [facility]* (retail)       [address]       [place], [place]  [address]       Ph: [phone]       Fax: [phone]   RxID:   [id number] TENORMIN 25 MG  TABS (ATENOLOL) 1 tab by mouth daily  #31 x 11   Entered and Authorized by:   Johney Maine MD   Signed by:    Johney Maine MD on 09/13/2008   Method used:   Electronically to        [facility]* (retail)       [address]       [place], [place]  [address]       Ph: [phone]       Fax: [phone]   RxID:   [id number] CITALOPRAM HYDROBROMIDE 20 MG TABS (CITALOPRAM HYDROBROMIDE) 1 TAB by mouth DAILY  #30 x 3   Entered and Authorized by:   Johney Maine MD   Signed by:   Johney Maine MD on 09/13/2008   Method used:   Electronically to        [facility]* (retail)       [address]       [place], [place]  [address]       Ph: [phone]       Fax: [phone]   RxID:   [id number]  ]  Influenza Vaccine    Vaccine Type: FLUARIX    Site: left deltoid    Mfr: GlaxoSmithKline    Dose: 0.5 ml    Route: IM    Given by: Lillia Pauls CMA    Exp. Date: 06/25/2009    Lot #: [id number]    VIS given: 07/20/07 version given September 13, 2008.

## 2011-01-26 NOTE — Progress Notes (Signed)
Summary: Nuc Pre-Procedure  Phone Note Outgoing Call Call back at [facility] Phone [phone]   Call placed by: Leonia Corona, RT-N,  Apr 30, 2009 4:17 PM Summary of Call: Reviewed information on Myoview Information Sheet (see scanned document for further details).  Spoke with patient.       Nuclear Med Background Indications for Stress Test: Evaluation for Ischemia, Surgical Clearance  Indications Comments: Pending Gynecological surgery- Dr. Dois Davenport Rivard  History: Angioplasty, Heart Catheterization, Myocardial Infarction, Myocardial Perfusion Study  History Comments: DMI treated with PTCA-RCA 1999; MPS 2000-Normal.EF= 66%; GERD  Symptoms: Chest Pain    Nuclear Pre-Procedure Cardiac Risk Factors: Family History - CAD, History of Smoking, Lipids, Obesity Height (in): 64

## 2011-01-26 NOTE — Progress Notes (Signed)
Summary: wi request  Phone Note Call from Patient Call back at Home Phone [phone]   Reason for Call: Talk to Nurse Summary of Call: pt is requesting wi appt, she sts she has a runny nose, coughing and her head is stopped up, pt told to be here @ 9:30 Initial call taken by: ERIN LEVAN,  September 26, 2007 8:47 AM

## 2011-01-26 NOTE — Assessment & Plan Note (Signed)
Summary: biopsy wp   Vital Signs:  Patient Profile:   60 Years Old Female Height:     63.5 inches Weight:      187.3 pounds Temp:     98.5 degrees F oral Pulse rate:   75 / minute BP sitting:   133 / 84  (left arm)  Vitals Entered By: Alphia Kava (October 01, 2008 9:17 AM)             Is Patient Diabetic? No     Chief Complaint:  biopsy.    Current Allergies: No known allergies       Physical Exam  Genitalia:     atrophic external genitalia, cervix without lesions, some cervical stenosis Additional Exam:     Patient given informed consent, signed copy in the chart. Placed in lithotomy position. Cervix viewed with sterile speculum. The portio of the cervix was cleansed with individual betadine swabs. The cervix was secured with a tenaculum placed in the anterior lip. The uterus was measured using sound. Endometrial sample was taken using GynoSampler pipelle. A small amount of cellular sample was obtained. two separate pipelles were used, butthe second yielded no sample.All equipment was removed. The patient tolerated the procedure well and was given post procedure instructions. She had some small amount of post procedure light headedness which resolved with supine position after 5 minutes. we will contact her with any pathology result     Impression & Recommendations:  Problem # 1:  ENDOMETRIAL HYPERPLASIA UNSPECIFIED (ICD-621.30) Assessment: New  Orders: Endometrial/Endocerv BX- [facility] ([address])   Complete Medication List: 1)  Bayer Aspirin 325 Mg Tabs (Aspirin) .... Take 1 tablet by mouth once a day 2)  Crestor 20 Mg Tabs (Rosuvastatin calcium) .... Take 1 tablet by mouth once a day 3)  Levothyroxine Sodium 125 Mcg Tabs (Levothyroxine sodium) .... Take 1 tablet by mouth once a day 4)  Flexeril 5 Mg Tabs (Cyclobenzaprine hcl) .Marland Kitchen.. 1 tablet by mouth at bedtime 5)  Tenormin 25 Mg Tabs (Atenolol) .Marland Kitchen.. 1 tab by mouth daily 6)  Gabapentin 300 Mg Caps (Gabapentin) .Marland Kitchen.. 1  tab by mouth tid 7)  Vicodin 5-500 Mg Tabs (Hydrocodone-acetaminophen) .Marland Kitchen.. 1-2 tabs by mouth every 6 hours as needed for pain 8)  Zyrtec Allergy 10 Mg Tabs (Cetirizine hcl) .Marland Kitchen.. 1 tab po once daily 9)  Terbinafine Hcl 250 Mg Tabs (Terbinafine hcl) .Marland Kitchen.. 1 tab by mouth daily for 12 weeks 10)  Citalopram Hydrobromide 20 Mg Tabs (Citalopram hydrobromide) .Marland Kitchen.. 1 tab by mouth daily 11)  Metronidazole 500 Mg Tabs (Metronidazole) .Marland Kitchen.. 1 tab by mouth two times a day for a week    ]

## 2011-01-26 NOTE — Letter (Signed)
Summary: [facility] - Pre-Op Clearance  [facility] - Pre-Op Clearance   Imported By: Marylou Mccoy 10/16/2010 08:38:16  _____________________________________________________________________  External Attachment:    Type:   Image     Comment:   External Document

## 2011-01-26 NOTE — Progress Notes (Signed)
Summary: requesting x-ray results  Phone Note Call from Patient Call back at Home Phone [phone]   Reason for Call: Lab or Test Results Summary of Call: pt is requesting the results of her x-rays that were done on her back Initial call taken by: ERIN LEVAN,  January 09, 2008 1:41 PM  Follow-up for Phone Call        "IMPRESSION:  Degenerative change involves the facet joints of L4-5.  Normal  alignment."  so, mild changes of osteoarthrits.  no fractures, no acute issues   Follow-up by: Johney Maine MD,  January 09, 2008 2:09 PM  Additional Follow-up for Phone Call Additional follow up Details #1::        Pt notified of above message. Additional Follow-up by: AMY MARTIN RN,  January 09, 2008 2:20 PM

## 2011-01-26 NOTE — Assessment & Plan Note (Signed)
Summary: low back pain/ls   Vital Signs:  Patient Profile:   60 Years Old Female Height:     63.5 inches Weight:      187.2 pounds BMI:     32.76 Temp:     97.9 degrees F Pulse rate:   90 / minute BP sitting:   134 / 83  Pt. in pain?   yes    Location:   back    Intensity:   9  Vitals Entered By: Sara Circle RN (January 03, 2008 8:30 AM)                  PCP:  Johney Maine MD  Chief Complaint:  back pain and numb r thigh.  History of Present Illness: [name] has a history of low back pain that got better with conservative treatment last fall.  She comes today with an acute flare of low back that is burning in quality with sharp shooting pains down her right thigh to her knee.  This started Saturday.  Seen at Urgent care monday, and given naproxyn, which is not really helping.  Yesterday began to have numbness on her anterior right thigh.  Concerned she may have some weakness in her right leg but thinks it may be due to pain.  Pain worsened by movement, but patient uncomfortable in all positions.  tried tramadol which is not working for pain.  no saddle anesthesia, fevers, bowel or bladder problems.   Current Allergies: No known allergies       Physical Exam  General:     moderate distress, in obvious pain Msk:     back ttp over lumbar spine and over right hip and buttock.  ROM limited to 10 degrees of flexion and 5 degrees of extension. hurts worse with extension.  right straight leg raise positive.  patellar reflexes 2+ bilaterally  strength limited r more than left but severely limited by pain.      Impression & Recommendations:  Problem # 1:  BACK PAIN, LOW (ICD-724.2) Assessment: Deteriorated acute flare of back pain. concerning for ruptured disk.  pain control is an issue--short course of narcotics while continuing NSAID.  Continue flexeril at night.  x-ray to rule out fracture/lesion.  bedrest not beneficial in back pain.  No red flag symptoms  currently. conservative therapy.  warning signs given.  may need MRI/neurosurg referral if progresses. Her updated medication list for this problem includes:    Bayer Aspirin 325 Mg Tabs (Aspirin) .Marland Kitchen... Take 1 tablet by mouth once a day    Flexeril 5 Mg Tabs (Cyclobenzaprine hcl) .Marland Kitchen... 1 tablet by mouth at bedtime    Vicodin 5-500 Mg Tabs (Hydrocodone-acetaminophen) .Marland Kitchen... 1-2 tabs by mouth every 6 hours as needed for pain  Orders: Diagnostic X-Ray/Fluoroscopy (Diagnostic X-Ray/Flu)   Complete Medication List: 1)  Bayer Aspirin 325 Mg Tabs (Aspirin) .... Take 1 tablet by mouth once a day 2)  Crestor 20 Mg Tabs (Rosuvastatin calcium) .... Take 1 tablet by mouth once a day 3)  Levothyroxine Sodium 125 Mcg Tabs (Levothyroxine sodium) .... Take 1 tablet by mouth once a day 4)  Flexeril 5 Mg Tabs (Cyclobenzaprine hcl) .Marland Kitchen.. 1 tablet by mouth at bedtime 5)  Tenormin 25 Mg Tabs (Atenolol) .Marland Kitchen.. 1 tab by mouth daily 6)  Gabapentin 300 Mg Caps (Gabapentin) .Marland Kitchen.. 1 tab by mouth tid 7)  Vicodin 5-500 Mg Tabs (Hydrocodone-acetaminophen) .Marland Kitchen.. 1-2 tabs by mouth every 6 hours as needed for pain   Patient  Instructions: 1)  Please make a follow up appointment with [facility] clinic for next week (or Dr. Karn Pickler if she is available). 2)  Obtain x-rays of your back--we will let you know the results. 3)  Return to clinic or the ER if you have trouble having a bowel movement or urine incontinence, fever, or numbess in your groin. 4)  Continue taking the naproxyn. 5)  Do not take the ultracet while you are taking the vicodin, as they both have tylenol. 6)  Do as much as you feel comfortable doing.    Prescriptions: VICODIN 5-500 MG  TABS (HYDROCODONE-ACETAMINOPHEN) 1-2 tabs by mouth every 6 hours as needed for pain  #30 x 0   Entered and Authorized by:   Levander Campion MD   Signed by:   Levander Campion MD on 01/03/2008   Method used:   Print then Give to Patient   RxID:    [id number]  ]  Appended Document: low back pain/ls    Clinical Lists Changes  Orders: Added new Test order of [facility]- Est  Level 4 (14782) - Signed

## 2011-01-26 NOTE — Miscellaneous (Signed)
Summary: Pre Op Clearance   Patient dropped off form to be filled out for surgery.  Please fax when completed. [name]  September 29, 2010 3:49 PM to Dr. Sammie Bench RN  September 29, 2010 3:53 PM  TRIAGE she will need clearance from her cardiologist--not from [place]  I have outthe forms back on your desk Thanks!  Sara Levy MD  October 02, 2010 4:54 PM I called pt & asked that sjhe have her cards complete the form & then fax it here for medical clearance. states she will..( form in [employer]. please do not toss!Golden Circle RN  October 02, 2010 5:00 PM

## 2011-01-26 NOTE — Assessment & Plan Note (Signed)
Summary: MEET NEW DR/KH   Primary Care Provider:  Magnus Ivan MD  CC:  L gluteal pain.  History of Present Illness: JOINT PAIN Location: L sided gluteus with radiation down L leg and to L ankle Description: consistent constant ache that has gotten worse Onset: 1 month ago Modifying factors: Flexeril allows pt to sleep and being on feet makes pain worse no known etiology, trauma.  pt being treated for sciatic by urgent care.  using flexeril, prednisone and tramadol.  pt has pain at night as well.  Symptoms- Swelling:  no Redness:  no  Weakness: yes Locking: yes  Red Flags Fever: no  Multiple joints: no  Rash: no STD exposure: no no losing of urine or stool, traumas, h/o of back pain non contributory here.    Allergies: No Known Drug Allergies  Physical Exam  General:  NAD, pleasant, obese vs noted and wnl Head:  normocephalic and atraumatic.   Ears:  L ear impacted with wax   Foot/Ankle Exam  Vascular:    2+ dorsalis pedis pulses bilaterally  Motor:    5/5 strength on ankle eversion and inversion   Knee Exam  Reflexes:    2+ knee reflexes bilaterally   Detailed Back/Spine Exam  Palpation:    tenderness to palpation on lower L back, no tenderness to palpation on spine  Lumbosacral Exam:  Range of Motion:    Forward Flexion:   90 degrees    Hyperextension:   35 degrees Lying Straight Leg Raise:    Right:  negative    Left:  negative     R straight leg raise caused pain in lower back L straight leg raise caused pain in groin forward flexion with pain hyperextension without pain   Hip Exam  Gait:    walk with a limp, no shuffling or ataxia  Skin:    hyperpigmented spot on L butt check  Palpation:    tenderness to palpation on mid gluteus to lateral leg on L.   R wnl   Impression & Recommendations:  Problem # 1:  LEG PAIN, LEFT (ICD-729.5) Assessment New pt had prior gone to an urgent care that is treating pt for sciatica.   currently on steroids, tramadol and flexeril.  pt not taking tramadol.  decided that we wanted pt to finish course of steroids and start tramadol (yet at half dose) and follow up with me in a week to re evaluate.  Complete Medication List: 1)  Bayer Aspirin 325 Mg Tabs (Aspirin) .... Take 1 tablet by mouth once a day 2)  Crestor 20 Mg Tabs (Rosuvastatin calcium) .... 2 tabs by mouth daily 3)  Levothyroxine Sodium 125 Mcg Tabs (Levothyroxine sodium) .... Take 1 tablet by mouth once a day 4)  Tenormin 25 Mg Tabs (Atenolol) .Marland Kitchen.. 1 tab by mouth daily 5)  Zyrtec Allergy 10 Mg Tabs (Cetirizine hcl) .Marland Kitchen.. 1 tab po once daily 6)  Meclizine Hcl 25 Mg Tabs (Meclizine hcl) .Marland Kitchen.. 1 by mouth q6 hours as needed vertigo  Other Orders: [facility]- Est Level  3 (01601)  Patient Instructions: 1)  You are being treated for sciatica by the urgent care and we want your steroids to be done before we see you again.  Please follow up with [place] after your steroids are done.  We will call you back with an appt. 2)  Try to soften the wax in your ear at home and maybe next time we can get the wax completely out.  Do  not use q tips. 3)  Thank you and be blessed!

## 2011-01-26 NOTE — Assessment & Plan Note (Signed)
Summary: fu fungus wp   Vital Signs:  Patient Profile:   60 Years Old Female Height:     63.5 inches Weight:      188.9 pounds BMI:     33.06 Temp:     98.1 degrees F Pulse rate:   61 / minute BP sitting:   124 / 74  (left arm)  Pt. in pain?   no  Vitals Entered By: Dedra Skeens CMA, (July 17, 2008 4:22 PM)                  PCP:  Johney Maine MD  Chief Complaint:  f/u fungus.  History of Present Illness: Pt returns to f/u toenail fungus  Pt has been taking Lamisil by mouth as directed.  Still has 2 weeks left of pills in bottle.  Thinks the med has helped her.  Less fungus on smaller nails.  Still has crooked and thick big toes.  T hey cause her pain and s he thinks they look bad.  Catch on her socks and pull. Would like some more improvement but not to keen on having nails removed.  Reviewed medical history.  Updated and reviewed medications.       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  R great toe is turned laterally.  Left great toe extremely thickened nail. All nails discolored brownish yellow.  R great toe mildly tender    Impression  & Recommendations:  Problem # 1:  ONYCHOMYCOSIS, TOENAILS (ICD-110.1) Assessment: Improved Although mildly improved, pt still w/ discomfort. Declines toenail removal.  Instead I clipped nails w/ "industrial" clippers.  Was able to cut nails down to normal length which removed curved portion.  Trimmed all nails.  Pt felt better.  Continue Lamisil until bottle out.  If continue beyond that, wil get LFTs.  Can consider removal in future if pt desires vs repeated trim. Her updated medication list for this problem includes:    Terbinafine Hcl 250 Mg Tabs (Terbinafine hcl) .Marland Kitchen... 1 tab by mouth daily for 12 weeks  Orders: [facility]- Est Level  3 (04540)   Complete Medication List: 1)  Bayer Aspirin 325 Mg Tabs (Aspirin) .... Take 1 tablet by mouth once a day 2)  Crestor 20 Mg Tabs (Rosuvastatin calcium) .... Take 1 tablet by mouth once a day 3)  Levothyroxine Sodium 125 Mcg Tabs (Levothyroxine sodium) .... Take 1 tablet by mouth once a day 4)  Flexeril 5 Mg Tabs (Cyclobenzaprine hcl) .Marland Kitchen.. 1 tablet by mouth at bedtime 5)  Tenormin 25 Mg Tabs (Atenolol) .Marland Kitchen.. 1 tab by mouth daily 6)  Gabapentin 300 Mg Caps (Gabapentin) .Marland Kitchen.. 1 tab by mouth tid 7)  Vicodin 5-500 Mg Tabs (Hydrocodone-acetaminophen) .Marland Kitchen.. 1-2 tabs by mouth every 6 hours as needed for pain 8)  Zyrtec Allergy 10 Mg Tabs (Cetirizine hcl) .Marland Kitchen.. 1 tab po once daily 9)  Terbinafine Hcl 250 Mg Tabs (Terbinafine hcl) .Marland Kitchen.. 1 tab by mouth daily for 12 weeks   Patient Instructions: 1)  f/u in Sept for blood pressure check   ]

## 2011-01-26 NOTE — Progress Notes (Signed)
  Phone Note Outgoing Call Call back at [facility] [phone] Call back at [phone]   Call placed by: Johney Maine MD,  September 19, 2008 8:53 AM Details for Reason: pap and u/s results Summary of Call: Attempted call to pt to discsuss pap and u/s.  No answer.  LM on VM for pt to call back.   Initial call taken by: Johney Maine MD,  September 19, 2008 8:54 AM  Follow-up for Phone Call        pt is returning call Follow-up by: Knox Royalty,  September 20, 2008 10:59 AM  Additional Follow-up for Phone Call Additional follow up Details #1::        called pt back.  Again, no answer.  LM on VM. Additional Follow-up by: Johney Maine MD,  September 20, 2008 11:36 AM    Additional Follow-up for Phone Call Additional follow up Details #2::    returning call Follow-up by: Haydee Salter,  September 20, 2008 4:33 PM  Additional Follow-up for Phone Call Additional follow up Details #3:: Details for Additional Follow-up Action Taken: Pt last LMP over 5 years ago.  Does get occasional cramping pain.  some bleeding with intercourse.  had endometrial bx many years ago for excessive bleeding so knows about procedure.  Instructed to call and set up appt for endometrial bx.  WOuld like 30 min appt for this Additional Follow-up by: Johney Maine MD,  September 23, 2008 10:42 AM

## 2011-01-26 NOTE — Progress Notes (Signed)
Summary: status of clearance  done  Phone Note From Other Clinic   Caller: carrie fax #  [phone] / [phone]- phone #  Request: Talk with Nurse Details of Complaint: status of  clearance for sugery if not call the office back and speak with pa.  Jermey. Initial call taken by: Lorne Skeens,  October 15, 2010 3:58 PM  Follow-up for Phone Call        again [facility] was faxed the clearnace for the pts surgery both yesterday and today.  Jermery aware Follow-up by: Charolotte Capuchin, RN,  October 15, 2010 4:22 PM

## 2011-01-26 NOTE — Progress Notes (Signed)
Summary: phone note  Phone Note Call from Patient Call back at Home Phone [phone]   Caller: Patient Summary of Call: xrays from last week Initial call taken by: Loralee Pacas CMA,  June 15, 2010 2:09 PM  Follow-up for Phone Call        called pt and left vm for her to call back Follow-up by: Asher Muir MD,  June 16, 2010 2:09 PM  Additional Follow-up for Phone Call Additional follow up Details #1::        called pt back.  see discussion below Additional Follow-up by: Asher Muir MD,  June 17, 2010 3:23 PM        Impression & Recommendations:  Problem # 1:  HIP PAIN, BILATERAL (ICD-719.45) Assessment Unchanged x-ray of left hip showed osteoarthritis and left femoral head flattening.  left femoral head flattening can be seen in avascular necrosis.  called pt and gave results.  advised her to make appt to follow up in one month and to come in sooner if her pain is worsening.  Do not think that mri is warranted at this point, but would have low threshold for getting it if pain worsens.  in the meantime, take diclofenac as needed and consider starting glucosamine/choindroitin  Her updated medication list for this problem includes:    Bayer Aspirin 325 Mg Tabs (Aspirin) .Marland Kitchen... Take 1 tablet by mouth once a day    Naprosyn 500 Mg Tabs (Naproxen) .Marland Kitchen... 1 tab by mouth two times a day for one week as needed pain  Problem # 2:  HYPERLIPIDEMIA (ICD-272.4) Assessment: Unchanged ldl 125 despite being on max dose of most potent statin.  offered nutrtional counseling, but pt states she does not have time.  encouraged her to exercise more.   Her updated medication list for this problem includes:    Crestor 20 Mg Tabs (Rosuvastatin calcium) .Marland Kitchen... 2 tabs by mouth daily  Complete Medication List: 1)  Bayer Aspirin 325 Mg Tabs (Aspirin) .... Take 1 tablet by mouth once a day 2)  Crestor 20 Mg Tabs (Rosuvastatin calcium) .... 2 tabs by mouth daily 3)  Levothyroxine Sodium 125 Mcg  Tabs (Levothyroxine sodium) .... Take 1 tablet by mouth once a day 4)  Atenolol 50 Mg Tabs (Atenolol) .... Take one tablet by mouth daily 5)  Naprosyn 500 Mg Tabs (Naproxen) .Marland Kitchen.. 1 tab by mouth two times a day for one week as needed pain 6)  Claritin 10 Mg Tabs (Loratadine) .... Take 1 pill daily for itching 7)  Fluticasone Propionate 50 Mcg/act Susp (Fluticasone propionate) .... 2 sprays in each nostril daily for allergies

## 2011-01-26 NOTE — Assessment & Plan Note (Signed)
Summary: fu/kh   Vital Signs:  Patient Profile:   60 Years Old Female Height:     63.5 inches Weight:      185.5 pounds BMI:     32.46 Temp:     98.1 degrees F Pulse rate:   63 / minute BP sitting:   122 / 72  Pt. in pain?   no  Vitals Entered By: Golden Circle RN (October 26, 2007 8:55 AM)                  PCP:  Johney Maine MD  Chief Complaint:  recheck back pain.  History of Present Illness: cc: general f/u 1)Back pain:  Has done much better with stretching exercises and as needed ibuprofen.  Takes Flexeril at bedtime every once in a while.  Not interested in imaging.  Feels better overall.   No intcontinence, weakness 2)Hx MI:  Taking atenolol as directed.  No CP, SOB.  Doing well 3)Depression:  Has been handling her grandaughters  quitting school as best she can.  She says she and Charlotte Sanes talk regularly and she tries to be stern but encouraging.  Is worried that [facility] wants to get pregnant.  Says she is not depressed altlhough occasionally feels down.  Does not want meds 4)Health Maintanence:  Has mammogram scheduled for next week.  Signed MR form to get colonoscopy results from a few years ago. 5)dizziness:  at end of visit pt mentions she got dizzy 1-2 times.  Occurred w/ a cold she has.  She is stuffy w/ congestion.  Did not feel like room was spinning.  Did not occur from sitting to standing.    Current Allergies: No known allergies      Review of Systems      See HPI   Physical Exam  General:     Well-developed,well-nourished,in no acute distress; alert,appropriate and cooperative throughout examination Eyes:     No corneal or conjunctival inflammation noted. EOMI. Perrla. Ears:     exam obscured by cerumen Nose:     External nasal examination shows no deformity or inflammation. Nasal mucosa are pink and moist without lesions or exudates. Mouth:     Oral mucosa and oropharynx without lesions or exudates.     Impression &  Recommendations:  Problem # 1:  BACK PAIN, LOW (ICD-724.2) Assessment: Improved continue current regimine Her updated medication list for this problem includes:    Bayer Aspirin 325 Mg Tabs (Aspirin) .Marland Kitchen... Take 1 tablet by mouth once a day    Flexeril 5 Mg Tabs (Cyclobenzaprine hcl) .Marland Kitchen... 1 tablet by mouth at bedtime   Problem # 2:  MYOCARDIAL INFARCTION, OLD (ICD-412) Assessment: Comment Only taking meds.  doing well Her updated medication list for this problem includes:    Bayer Aspirin 325 Mg Tabs (Aspirin) .Marland Kitchen... Take 1 tablet by mouth once a day    Tenormin 25 Mg Tabs (Atenolol) .Marland Kitchen... 1 tab by mouth daily   Problem # 3:  DEPRESSION, MAJOR, RECURRENT (ICD-296.30) Assessment: Improved doing well given circumstances.  continue to follow Orders: [facility]- Est Level  3 (16109)   Problem # 4:  DIZZINESS (ICD-780.4) Assessment: New Could be due to cerumen impaction and fluid collection behind TM from past URI.  Recommended cerumen removal w/ drops.  If continues to have symptoms will reexamine and spend whole visit on issue  Problem # 5:  HEALTH MAINTENANCE EXAM (ICD-V70.0) Assessment: Comment Only UP to date on all.  Complete Medication List: 1)  Bayer Aspirin 325 Mg Tabs (Aspirin) .... Take 1 tablet by mouth once a day 2)  Crestor 20 Mg Tabs (Rosuvastatin calcium) .... Take 1 tablet by mouth once a day 3)  Levothyroxine Sodium 125 Mcg Tabs (Levothyroxine sodium) .... Take 1 tablet by mouth once a day 4)  Flexeril 5 Mg Tabs (Cyclobenzaprine hcl) .Marland Kitchen.. 1 tablet by mouth at bedtime 5)  Tenormin 25 Mg Tabs (Atenolol) .Marland Kitchen.. 1 tab by mouth daily 6)  Gabapentin 300 Mg Caps (Gabapentin) .Marland Kitchen.. 1 tab by mouth tid  Other Orders: Flu Vaccine 54yrs + (16109) Admin 1st Vaccine (60454)   Patient Instructions: 1)  Please schedule a follow-up appointment in 3 months.  Call in January for appointment in February. 2)  Try over the counter ear drops or hydrogen peroxide and water with bulb syrnge  to soften ear wax.  CAll if dizziness worsens 3)  Stop by and see Alvino Chapel about Crestor    ]  Influenza Vaccine    Vaccine Type: Fluvax 3+    Site: right deltoid    Mfr: Sanofi Pasteur    Dose: 0.5 ml    Route: IM    Given by: Golden Circle RN    Exp. Date: 06/25/2008    Lot #: [id number]    VIS given: 06/25/05 version given October 26, 2007.  Flu Vaccine Consent Questions    Do you have a history of severe allergic reactions to this vaccine? no    Any prior history of allergic reactions to egg and/or gelatin? no    Do you have a sensitivity to the preservative Thimersol? no    Do you have a past history of Guillan-Barre Syndrome? no    Do you currently have an acute febrile illness? no    Have you ever had a severe reaction to latex? no    Vaccine information given and explained to patient? yes    Are you currently pregnant? no

## 2011-01-26 NOTE — Miscellaneous (Signed)
  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

## 2011-01-26 NOTE — Assessment & Plan Note (Signed)
Summary: routine visit/el   Vital Signs:  Patient Profile:   60 Years Old Female Height:     63.5 inches Weight:      188 pounds Temp:     97.6 degrees F Pulse rate:   61 / minute BP sitting:   136 / 68  Pt. in pain?   yes    Location:   lower back    Intensity:   4  Vitals Entered By: Jone Baseman CMA (March 15, 2008 9:14 AM)                 Colonoscopy Result Date:  11/11/2004 Colonoscopy Result:  hyperplastic polyps, no malignancy. Colonoscopy Next Due:  3 yr Last PAP:  Done. (04/26/2006 12:00:00 AM) PAP Result Date:  08/28/2007 PAP Result:  normal PAP Next Due:  1 yr   PCP:  JESSICA TRICHE MD  Chief Complaint:  f/u lower back.  History of Present Illness: 1)Back pain:  Much better overall.  Still has a constant low back pain.  Flexeril helps.  Using vicodin as needed but not much.  Occasional R thigh numbness.  No urinary problems.  No groin numbness.  Continues to work.  Does not lie in bed as she thinks this makes work 2)Hypothyroid:  Has not had TSH checked in over a year.   Denies wt loss or gain.  Denies dry skin, diarrhea, constipation, palpitations.  Taking Synthroid 3)Depression:  Doing much better.  Still has some "rough" days.  Her granddaughter is doing better and back living w/ her.  She denies suicidal ideaions.  Does not want medications.       Current Allergies: No known allergies   Past Medical History:    Reviewed history from 09/04/2007 and no changes required:       ASCUS 1999       FU Cardiolite 1/00 no ischemia, EF 66%       PAP 10/01- benign reactive/reparative changes, perimenopausal,       s/p DMI 2/99 with urgent RCA PTCA, good LV fctn       MYOCARDIAL INFARCTION, OLD (ICD-412)       HYPOTHYROIDISM, UNSPECIFIED (ICD-244.9)       HYPERCHOLESTEROLEMIA (ICD-272.0)       ESOPHAGITIS, UNSPECIFIED (ICD-530.10)       DEPRESSION, MAJOR, RECURRENT (ICD-296.30)       CARPAL TUNNEL SYNDROME (ICD-354.0)       BACK PAIN, LOW (ICD-724.2)        Past Surgical History:    BTL    Risk Factors:  PAP Smear History:     Date of Last PAP Smear:  08/28/2007   PAP Smear History:     Date of Last PAP Smear:  08/28/2007    Results:  normal   Colonoscopy History:     Date of Last Colonoscopy:  11/11/2004   Colonoscopy History:     Date of Last Colonoscopy:  11/11/2004    Results:  hyperplastic polyps, no malignancy.    Review of Systems      See HPI   Physical Exam  General:     Well-developed,well-nourished,in no acute distress; alert,appropriate and cooperative throughout examination Lungs:     Clear to auscultation bilaterally.  Normal work of breathing.  No wheezes, rales, or rhonchi.  Heart:     Regular rate and rhythm.  No murmur, rub, or gallop.  2+ Dorsalis Pedis pulses.  Extremities:     No edema    Impression & Recommendations:  Problem #  1:  BACK PAIN, LOW (ICD-724.2) Assessment: Improved Doing better.  COnservative managment.  MRI if sypmtoms worsen.  Refilled Flexeril and Vicodin.   Her updated medication list for this problem includes:    Bayer Aspirin 325 Mg Tabs (Aspirin) .Marland Kitchen... Take 1 tablet by mouth once a day    Flexeril 5 Mg Tabs (Cyclobenzaprine hcl) .Marland Kitchen... 1 tablet by mouth at bedtime    Vicodin 5-500 Mg Tabs (Hydrocodone-acetaminophen) .Marland Kitchen... 1-2 tabs by mouth every 6 hours as needed for pain  Orders: [facility]- Est  Level 4 ([address])   Problem # 2:  HYPOTHYROIDISM, UNSPECIFIED (ICD-244.9) Assessment: Unchanged Will check TSH Her updated medication list for this problem includes:    Levothyroxine Sodium 125 Mcg Tabs (Levothyroxine sodium) .Marland Kitchen... Take 1 tablet by mouth once a day  Orders: [facility]- Est  Level 4 ([address])  Future Orders: TSH-[facility] ([id number]) ... 03/01/2009   Problem # 3:  DEPRESSION, MAJOR, RECURRENT (ICD-296.30) Assessment: Improved Follow for now.  No meds needed Orders: [facility]- Est  Level 4 ([address])   Complete Medication List: 1)  Bayer Aspirin 325 Mg Tabs (Aspirin)  .... Take 1 tablet by mouth once a day 2)  Crestor 20 Mg Tabs (Rosuvastatin calcium) .... Take 1 tablet by mouth once a day 3)  Levothyroxine Sodium 125 Mcg Tabs (Levothyroxine sodium) .... Take 1 tablet by mouth once a day 4)  Flexeril 5 Mg Tabs (Cyclobenzaprine hcl) .Marland Kitchen.. 1 tablet by mouth at bedtime 5)  Tenormin 25 Mg Tabs (Atenolol) .Marland Kitchen.. 1 tab by mouth daily 6)  Gabapentin 300 Mg Caps (Gabapentin) .Marland Kitchen.. 1 tab by mouth tid 7)  Vicodin 5-500 Mg Tabs (Hydrocodone-acetaminophen) .Marland Kitchen.. 1-2 tabs by mouth every 6 hours as needed for pain 8)  Zyrtec Allergy 10 Mg Tabs (Cetirizine hcl) .Marland Kitchen.. 1 tab po once daily   Patient Instructions: 1)  Please schedule a follow-up appointment in 3-6 months. 2)  f/u MONDAY for lab visit    Prescriptions: ZYRTEC ALLERGY 10 MG  TABS (CETIRIZINE HCL) 1 tab po once daily  #30 x 5   Entered and Authorized by:   Johney Maine MD   Signed by:   Johney Maine MD on 03/15/2008   Method used:   Print then Give to Patient   RxID:   [id number] VICODIN 5-500 MG  TABS (HYDROCODONE-ACETAMINOPHEN) 1-2 tabs by mouth every 6 hours as needed for pain  #30 x 0   Entered and Authorized by:   Johney Maine MD   Signed by:   Johney Maine MD on 03/15/2008   Method used:   Print then Give to Patient   RxID:   [id number] TENORMIN 25 MG  TABS (ATENOLOL) 1 tab by mouth daily  #31 x 5   Entered and Authorized by:   Johney Maine MD   Signed by:   Johney Maine MD on 03/15/2008   Method used:   Print then Give to Patient   RxID:   [id number] FLEXERIL 5 MG TABS (CYCLOBENZAPRINE HCL) 1 tablet by mouth at bedtime  #30 x 5   Entered and Authorized by:   Johney Maine MD   Signed by:   Johney Maine MD on 03/15/2008   Method used:   Print then Give to Patient   RxID:   [id number] LEVOTHYROXINE SODIUM 125 MCG TABS (LEVOTHYROXINE SODIUM) Take 1 tablet by mouth once a day  #30 x 5   Entered and Authorized by:   Johney Maine MD   Signed by:   Johney Maine MD  on 03/15/2008   Method used:   Print then Give to Patient   RxID:   [id number]  ]

## 2011-01-26 NOTE — Consult Note (Signed)
Summary: [facility]  [facility]   Imported By: Clydell Hakim 09/14/2010 15:52:32  _____________________________________________________________________  External Attachment:    Type:   Image     Comment:   External Document

## 2011-01-26 NOTE — Miscellaneous (Signed)
Summary: ENDOMETRIAL BIOPSY PLAN  Clinical Lists Changes [facility]: Ultrasound of pelvis done for abnormal clinical exam (large uterus) at [facility]. US showed 9 mm stripe in postmenopausal woman. She has had no vaginal bleeding. At one time she was on HRT and had vaginal bleeding and an endometrial biospy which by report was normal. She stopped the HRT and has had no bleeding since then Her endometrial biopsy here returned a good cellular sample which was read by pathology as scant so I am a little less certain that we can just end our workup here. basuically we are working up an incidental finding of thickened endometrium in post menopausal woman with no bleeding, no symptoms.   I discussed with her at time of ov, and have sent her letter--I think a repeat US in march would be appropriate. If stripe still > 5 would recommend further eval and I would send her to GYN.  LS to patyient  and copy to Dr Karn Pickler

## 2011-01-26 NOTE — Letter (Signed)
Summary: Generic Letter  [facility]  [address]   [place], [place] [address]   Phone: [phone]  Fax: [phone]    06/15/2010  [name] [address] [place], [place]  [address]  Dear [name],  Please call me when you have an opportunity to discuss your x-ray results.  I look forward to talking with you.       Sincerely,   Asher Muir MD  Appended Document: Generic Letter mailed

## 2011-01-26 NOTE — Progress Notes (Signed)
Summary: refill  Phone Note Refill Request Call back at Home Phone [phone] Message from:  Patient  Refills Requested: Medication #1:  TENORMIN 25 MG  TABS 1 tab by mouth daily pt is now out - called into pharmacy last week. [facility] [address]  Initial call taken by: De Nurse,  April 10, 2010 11:55 AM  Follow-up for Phone Call        called pt & lm that it has been taken care of Follow-up by: Golden Circle RN,  April 10, 2010 12:05 PM    Prescriptions: TENORMIN 25 MG  TABS (ATENOLOL) 1 tab by mouth daily  #31 x 2   Entered by:   Golden Circle RN   Authorized by:   Asher Muir MD   Signed by:   Golden Circle RN on 04/10/2010   Method used:   Electronically to        [facility] [phone]* (retail)       [address]       [place], [place]  [address]       Ph: [phone]       Fax: [phone]   RxID:   [id number]

## 2011-01-26 NOTE — Assessment & Plan Note (Signed)
Summary: f/up leg,tcb   Vital Signs:  Patient profile:   60 year old female Weight:      182.9 pounds Temp:     98.1 degrees F oral Pulse rate:   64 / minute Pulse rhythm:   regular BP sitting:   118 / 72  (left arm) Cuff size:   large  Vitals Entered By: Loralee Pacas CMA (July 20, 2010 9:49 AM) CC: leg pain Pain Assessment Patient in pain? yes     Location: leg Intensity: 6   Primary Provider:  Barnabas Lister MD  CC:  leg pain.  History of Present Illness: CC: F/U Left Leg pain and discuss results of hip imaging from 6/13  HPI: 60 yo WF c/o L leg pain that started 3 yrs ago.  Pain is a constant ache that is worse with walking and locks up after sitting for a long time.  Pain radiates from L buttock to L knee.  Pain scale 6/10.  Takes Naprosyn 500mg  one tablet as needed.    ROS: Abnormal gait/staggering.  Denies falls.  Denies CP, SOB, nausea vomiting.  Habits & Providers  Alcohol-Tobacco-Diet     Tobacco Status: quit > 6 months  Current Problems (verified): 1)  Hip Pain, Left  (ICD-719.45) 2)  Atrial Fibrillation  (ICD-427.31) 3)  Cad  (ICD-414.00) 4)  Myocardial Infarction, Old  (ICD-412) 5)  Health Maintenance Exam  (ICD-V70.0) 6)  Hyperlipidemia  (ICD-272.4) 7)  Essential Hypertension, Benign  (ICD-401.1) 8)  Obesity, Unspecified  (ICD-278.00) 9)  Endometrial Hyperplasia Unspecified  (ICD-621.30) 10)  Hip Pain, Bilateral  (ICD-719.45) 11)  Onychomycosis, Toenails  (ICD-110.1) 12)  Hypothyroidism, Unspecified  (ICD-244.9) 13)  Esophagitis, Unspecified  (ICD-530.10)  Current Medications (verified): 1)  Bayer Aspirin 325 Mg Tabs (Aspirin) .... Take 1 Tablet By Mouth Once A Day 2)  Crestor 20 Mg Tabs (Rosuvastatin Calcium) .... 2 Tabs By Mouth Daily 3)  Levothyroxine Sodium 125 Mcg Tabs (Levothyroxine Sodium) .... Take 1 Tablet By Mouth Once A Day 4)  Atenolol 50 Mg Tabs (Atenolol) .... Take One Tablet By Mouth Daily 5)  Naprosyn 500 Mg Tabs (Naproxen) .Marland Kitchen..  1 Tab By Mouth Two Times A Day For One Week As Needed Pain 6)  Claritin 10 Mg Tabs (Loratadine) .... Take 1 Pill Daily For Itching 7)  Fluticasone Propionate 50 Mcg/act Susp (Fluticasone Propionate) .... 2 Sprays in Each Nostril Daily For Allergies  Allergies (verified): No Known Drug Allergies  Past History:  Family History: Last updated: 10/23/2009 Father-MI at 87 Mother-DM, heart problems, HTN Brothers- intestinal and throat cancer Older brother-bladder tumors (not malignant)  Risk Factors: Alcohol Use: 0 (09/10/2009) Exercise: no (09/04/2007)  Past Medical History: CAD (ICD-414.00) MYOCARDIAL INFARCTION, OLD (ICD-412) HEALTH MAINTENANCE EXAM (ICD-V70 LEG PAIN, LEFT (ICD-729.5) DIZZINESS (ICD-780.4) HYPERLIPIDEMIA (ICD-272.4) ESSENTIAL HYPERTENSION, BENIGN (ICD-401.1) OBESITY, UNSPECIFIED (ICD-278.00) PREOPERATIVE EXAMINATION (ICD-V72.84) ENDOMETRIAL HYPERPLASIA UNSPECIFIED (ICD-621.30) ENDOMETRIAL HYPERPLASIA UNSPECIFIED (ICD-621.30) HIP PAIN, BILATERAL (ICD-719.45) HYPOTHYROIDISM, UNSPECIFIED (ICD-244.9) HYPERCHOLESTEROLEMIA (ICD-272.0) ESOPHAGITIS, UNSPECIFIED (ICD-530.10) BACK PAIN, LOW (ICD-724.2)  Social History: Reviewed history from 02/23/2010 and no changes required. Married 5 TEFL teacher Quit Tobacco 20 year ago after 25 years 1 ppd pt was helping raise two granddaughters and now  1 granddaughter lives with her mother and another does not permanently stay in house (02/23/09)  Review of Systems       per hpi  Physical Exam  General:  Well-developed,well-nourished,in no acute distress; alert,appropriate and cooperative throughout examination Lungs:  Normal respiratory effort, chest expands symmetrically.  Lungs are clear to auscultation, no crackles or wheezes. Heart:  Normal rate and regular rhythm. S1 and S2 normal without gallop, murmur, click, rub or other extra sounds. Msk:  decreased ROM of L lower extremity.  6/10 joint  tenderness. Extremities:  trace left pedal edema and trace right pedal edema.     Impression & Recommendations:  Problem # 1:  HIP PAIN, LEFT (ICD-719.45) Pt is here for F/U left hip pain from 06/17/2010. Pain has become worse since last visit.  Taking Naprosyn 500mg  once a day.  Left hip complete 2 view showed mild flattening of the L femoral head, can be seen with avascular necrosis, and left hip OA.  Discussed results with patient and advised that she be seen by orthopedic surgery for possible hip replacement.  Pt understood and agreed to see ortho.    Her updated medication list for this problem includes:    Bayer Aspirin 325 Mg Tabs (Aspirin) .Marland Kitchen... Take 1 tablet by mouth once a day    Naprosyn 500 Mg Tabs (Naproxen) .Marland Kitchen... 1 tab by mouth two times a day for one week as needed pain  Orders: [facility]- Est Level  3 ([address]) Orthopedic Referral (Ortho)  Complete Medication List: 1)  Bayer Aspirin 325 Mg Tabs (Aspirin) .... Take 1 tablet by mouth once a day 2)  Crestor 20 Mg Tabs (Rosuvastatin calcium) .... 2 tabs by mouth daily 3)  Levothyroxine Sodium 125 Mcg Tabs (Levothyroxine sodium) .... Take 1 tablet by mouth once a day 4)  Atenolol 50 Mg Tabs (Atenolol) .... Take one tablet by mouth daily 5)  Naprosyn 500 Mg Tabs (Naproxen) .Marland Kitchen.. 1 tab by mouth two times a day for one week as needed pain 6)  Claritin 10 Mg Tabs (Loratadine) .... Take 1 pill daily for itching 7)  Fluticasone Propionate 50 Mcg/act Susp (Fluticasone propionate) .... 2 sprays in each nostril daily for allergies  Patient Instructions: 1)  Please schedule appt with orthopedic surgery to further evaluate Left hip pain. 2)  Please return to clinic for CPE in 1 year.

## 2011-01-26 NOTE — Progress Notes (Signed)
Summary: calling re surg clearence  Phone Note Call from Patient   Caller: Patient Reason for Call: Talk to Nurse Summary of Call: pt calling to follow up on surg clearence form-is it ready? [phone] Initial call taken by: Glynda Jaeger,  October 12, 2010 3:28 PM  Follow-up for Phone Call        pt calling back re sur clearence Glynda Jaeger  October 13, 2010 1:45 PM Mrs. Code calls today regarding left hip surgical clearance form.  She states she brought it in almost 2 weeks ago and needs to have her hip surgery as she is in a lot of pain with it now.  She denies any chest pain or angina since last seeing Dr. Antoine Poche in June.  I told her I would send this message to Dr. Antoine Poche and Elita Quick as they are in [facility] today. Mylo Red RN     Appended Document: calling re surg clearence Done

## 2011-01-26 NOTE — Consult Note (Signed)
Summary: [facility]   Imported By: Clydell Hakim 04/18/2009 16:41:12  _____________________________________________________________________  External Attachment:    Type:   Image     Comment:   External Document

## 2011-01-26 NOTE — Assessment & Plan Note (Signed)
Summary: FUNGUS TO TOES/  DPG   Vital Signs:  Patient Profile:   60 Years Old Female Height:     63.5 inches Weight:      190.7 pounds BMI:     33.37 Temp:     98.4 degrees F Pulse rate:   57 / minute BP sitting:   149 / 78  (left arm)  Pt. in pain?   no  Vitals Entered By: Alphia Kava (June 06, 2008 8:40 AM)              Is Patient Diabetic? No     PCP:  JESSICA TRICHE MD  Chief Complaint:  bil great toe fungus.  History of Present Illness: Pt says that for many years has had toenail fungus.  Has gotten worse of past few months.  R great toe  hurts on occasion.  More of cosmetic issue.  Says she doesn't like way looks.  Pain is only minimal on occasion.    Current Allergies: No known allergies      Review of Systems      See HPI   Physical Exam  General:     Well-developed,well-nourished,in no acute distress; alert,appropriate and cooperative throughout examination Extremities:     All toenails w/ signficant fungal growth.  R great toe is turned laterally.  Left great toe extremely thickened nail. All nails discolored brownish yellow.  Nontender    Impression & Recommendations:  Problem # 1:  ONYCHOMYCOSIS, TOENAILS (ICD-110.1) Assessment: New Given cosmetic appearnance and discomfort will treat w/ oral Terbinafine.  Obtaiend baseline LFTs which we will repeat in 6 weeks if continued use.  Max duration is 12 weeks.  If no improvement at 6 weeks, d/c and will remove R great toenail.   Her updated medication list for this problem includes:    Terbinafine Hcl 250 Mg Tabs (Terbinafine hcl) .Marland Kitchen... 1 tab by mouth daily for 12 weeks  Orders: [facility]- Est Level  3 ([address])   Complete Medication List: 1)  Bayer Aspirin 325 Mg Tabs (Aspirin) .... Take 1 tablet by mouth once a day 2)  Crestor 20 Mg Tabs (Rosuvastatin calcium) .... Take 1 tablet by mouth once a day 3)  Levothyroxine Sodium 125 Mcg Tabs (Levothyroxine sodium) .... Take 1 tablet by mouth once a day 4)   Flexeril 5 Mg Tabs (Cyclobenzaprine hcl) .Marland Kitchen.. 1 tablet by mouth at bedtime 5)  Tenormin 25 Mg Tabs (Atenolol) .Marland Kitchen.. 1 tab by mouth daily 6)  Gabapentin 300 Mg Caps (Gabapentin) .Marland Kitchen.. 1 tab by mouth tid 7)  Vicodin 5-500 Mg Tabs (Hydrocodone-acetaminophen) .Marland Kitchen.. 1-2 tabs by mouth every 6 hours as needed for pain 8)  Zyrtec Allergy 10 Mg Tabs (Cetirizine hcl) .Marland Kitchen.. 1 tab po once daily 9)  Terbinafine Hcl 250 Mg Tabs (Terbinafine hcl) .Marland Kitchen.. 1 tab by mouth daily for 12 weeks  Other Orders: Comp Met-[facility] [id number]) Lipid-[facility] ([id number]) TSH-[facility] ([id number])   Patient Instructions: 1)  Please schedule a follow-up appointment in 6 weeks. 2)  Start takin one pill of Terbinafine a day for 6 weeks.  Will will then recheck your nails and decide on next step.   Prescriptions: TERBINAFINE HCL 250 MG  TABS (TERBINAFINE HCL) 1 tab by mouth daily for 12 weeks  #30 x 2   Entered and Authorized by:   Johney Maine MD   Signed by:   Johney Maine MD on 06/06/2008   Method used:   Electronically sent to ...       [facility]  [address]       [address]       [place], [place]  [address]       Ph: [phone]       Fax: [phone]   RxID:   [id number]  ]

## 2011-01-26 NOTE — Progress Notes (Signed)
  Phone Note Outgoing Call   Call placed by: Asher Muir MD,  June 02, 2010 10:09 AM Summary of Call: left vm for pt to call me about cholesterol results.  Initial call taken by: Asher Muir MD,  June 02, 2010 10:10 AM

## 2011-01-26 NOTE — Assessment & Plan Note (Signed)
Summary: lightheaded/[place]/williams   Vital Signs:  Patient profile:   60 year old female Height:      62 inches Weight:      181.31 pounds BMI:     33.28 Temp:     97.9 degrees F oral Pulse rate:   66 / minute Pulse (ortho):   64 / minute Pulse rhythm:   regular BP sitting:   127 / 72  (right arm) BP standing:   127 / 80  Vitals Entered By: Modesta Messing LPN (September 10, 2009 2:04 PM)  Serial Vital Signs/Assessments:  Time      Position  BP       Pulse  Resp  Temp     By 3:32 PM   Lying LA  124/70   53                    Adina Gould 3:32 PM   Sitting   128/73   60                    Adina Gould 3:32 PM   Standing  127/80   64                    Adina Gould  CC: Lightheaded "I woke up this way."  Work in visit. Is Patient Diabetic? No Pain Assessment Patient in pain? no        Primary Care Martha Soltys:  Sara Ivan MD  CC:  Lightheaded "I woke up this way."  Work in visit.[name]  History of Present Illness: 60 y/o F here with "lightheadedness"  -started this a.m. -similar symptoms in past with associated inner ear infection, per patient -denies shortness of breath, chest pain, near passing out, blurred vision, headaches -feeling of "room spinning." worsened by sitting or standing up quickly  Habits & Providers  Alcohol-Tobacco-Diet     Alcohol drinks/day: 0     Tobacco Status: quit     Tobacco Counseling: to remain off tobacco products  Current Medications (verified): 1)  Bayer Aspirin 325 Mg Tabs (Aspirin) .... Take 1 Tablet By Mouth Once A Day 2)  Crestor 20 Mg Tabs (Rosuvastatin Calcium) .... 2 Tabs By Mouth Daily 3)  Levothyroxine Sodium 125 Mcg Tabs (Levothyroxine Sodium) .... Take 1 Tablet By Mouth Once A Day 4)  Tenormin 25 Mg  Tabs (Atenolol) .[name].. 1 Tab By Mouth Daily 5)  Zyrtec Allergy 10 Mg  Tabs (Cetirizine Hcl) .[name].. 1 Tab Po Once Daily 6)  Meclizine Hcl 25 Mg Tabs (Meclizine Hcl) .[name].. 1 By Mouth Q6 Hours As Needed Vertigo  Allergies  (verified): No Known Drug Allergies  Social History: Smoking Status:  quit  Physical Exam  General:  Well-developed,well-nourished,in no acute distress; alert,appropriate and cooperative throughout examination. vitals reviewed.  Eyes:  No corneal or conjunctival inflammation noted. EOMI. Perrla. Vision grossly normal. Ears:  L ear with thick impacted cerumen. R ear normal, with normal TM.  Lungs:  Normal respiratory effort, chest expands symmetrically. Lungs are clear to auscultation, no crackles or wheezes. Heart:  bradycardic. regular rhythm. S1 and S2 normal without gallop, murmur, click, rub or other extra sounds. Neurologic:  No cranial nerve deficits noted. Station and gait are normal. Sensory, motor and coordinative functions appear intact.   Impression & Recommendations:  Problem # 1:  DIZZINESS (ICD-780.4) Assessment New  possibly 2/2 cerumen impaction. unsuccessful irrigation in office. see patient instructions for recs. meclizine as needed. f/u with PCP for further  work up.   Her updated medication list for this problem includes:    Zyrtec Allergy 10 Mg Tabs (Cetirizine hcl) .[name]... 1 tab po once daily    Meclizine Hcl 25 Mg Tabs (Meclizine hcl) .[name]... 1 by mouth q6 hours as needed vertigo  Orders: [facility]- Est Level  3 ([address])  Patient Instructions: 1)  Nice to have met you. 2)  I would recommend trying Meclizine (a motion sickness medicine) for your lightheaded feeling.  3)  Follow up with Dr. Mayford Knife in 2-4 weeks.  4)  Try to loosen up the wax in your ear with any of the counter ear wax removal medications, colace (a stool softener), peroxide, etc.  Prescriptions: MECLIZINE HCL 25 MG TABS (MECLIZINE HCL) 1 by mouth q6 hours as needed vertigo  #60 x 1   Entered and Authorized by:   Lequita Asal  MD   Signed by:   Lequita Asal  MD on 09/10/2009   Method used:   Electronically to        [facility] [phone]* (retail)       [address]        [place], [place]  [address]       Ph: [phone]       Fax: [phone]   RxID:   [id number]

## 2011-01-26 NOTE — Assessment & Plan Note (Signed)
Summary: l leg pain worse//williams   Vital Signs:  Patient profile:   59 year old female Height:      63 inches Weight:      183 pounds BMI:     32.53 Temp:     97.9 degrees F Pulse rate:   59 / minute BP sitting:   122 / 75  (left arm) Cuff size:   regular  Vitals Entered By: Asher Muir MD (October 17, 2009 10:18 AM) CC: Work in for left hip/leg pain Is Patient Diabetic? No Pain Assessment Patient in pain? yes     Location: left hip Intensity: 10 Type: sharp Onset of pain  5 weeks ago   Primary Care [place]:  Magnus Ivan MD  CC:  Work in for left hip/leg pain.  History of Present Illness: 1.  hip/back/leg pain--intermittent problem for her.  diagnosed with sciatic at [facility] and given prednisone burst and flexeril.  this helped and pain got better.  it came back yesterday.  starts on left lateral buttock and radiates down lateral thigh.  sometimes radiates to left lateral calf.  no true weakness.  no groin numbness, leg numbness, b/b incontinence.  ibuprofen helps.  in the past naproxen has helped.  worse with walking and pivoting.   Habits & Providers  Alcohol-Tobacco-Diet     Tobacco Status: quit > 6 months     Tobacco Counseling: to remain off tobacco products  Current Medications (verified): 1)  Bayer Aspirin 325 Mg Tabs (Aspirin) .... Take 1 Tablet By Mouth Once A Day 2)  Crestor 20 Mg Tabs (Rosuvastatin Calcium) .... 2 Tabs By Mouth Daily 3)  Levothyroxine Sodium 125 Mcg Tabs (Levothyroxine Sodium) .... Take 1 Tablet By Mouth Once A Day 4)  Tenormin 25 Mg  Tabs (Atenolol) .Marland Kitchen.. 1 Tab By Mouth Daily 5)  Zyrtec Allergy 10 Mg  Tabs (Cetirizine Hcl) .Marland Kitchen.. 1 Tab Po Once Daily  Allergies: No Known Drug Allergies  Social History: Smoking Status:  quit > 6 months  Physical Exam  General:  Well-developed,well-nourished,in no acute distress; alert,appropriate and cooperative throughout examination. vitals reviewed.    Detailed Back/Spine Exam  Gait:    antalgic.  favors right side  Palpation:    ttp over sciatic notch, but also over trochanteric bursa  Lumbosacral Exam:  Inspection-deformity:    Normal Range of Motion:    Forward Flexion:   90 degrees    Hyperextension:   15 degrees    Right Lateral Bend:   30 degrees    Left Lateral Bend:   30 degrees Squatting:  normal    does not reverse curve with forward flexion Lying Straight Leg Raise:    Right:  negative    Left:  positive at 60 degrees Sitting Straight Leg Raise:    Right:  negative    Left:  positive at 60 degrees     has difficulty toe and heel walking secondary to pain.  ankle and patellar reflexes are normal   Impression & Recommendations:  Problem # 1:  BACK PAIN, LOW (ICD-724.2) Assessment Deteriorated think this is sciatica; although the starting point of the pain is not classic.  no red flag symptoms.  she has responded well to conservative tx in the past; so think we should continue that.  naproxen, flexeril.  also try PT.  f/u with primary md as previously scheduled.    one other thought:  wonder if there is also component of trochanteric bursitis.  might be worth trying injection  of bursa at some point.  Her updated medication list for this problem includes:    Bayer Aspirin 325 Mg Tabs (Aspirin) .Marland Kitchen... Take 1 tablet by mouth once a day    Cyclobenzaprine Hcl 5 Mg Tabs (Cyclobenzaprine hcl) .Marland Kitchen... 1 tablet by mouth three times a day as needed pain    Naprosyn 500 Mg Tabs (Naproxen) .Marland Kitchen... 1 tab by mouth two times a day as needed pain  Orders: Physical Therapy Referral (PT) Basic Met-[facility] (11914-78295) [facility]- Est Level  3 (62130)  Complete Medication List: 1)  Bayer Aspirin 325 Mg Tabs (Aspirin) .... Take 1 tablet by mouth once a day 2)  Crestor 20 Mg Tabs (Rosuvastatin calcium) .... 2 tabs by mouth daily 3)  Levothyroxine Sodium 125 Mcg Tabs (Levothyroxine sodium) .... Take 1 tablet by mouth once a day 4)  Tenormin 25 Mg Tabs (Atenolol) .Marland Kitchen.. 1 tab by  mouth daily 5)  Zyrtec Allergy 10 Mg Tabs (Cetirizine hcl) .Marland Kitchen.. 1 tab po once daily 6)  Cyclobenzaprine Hcl 5 Mg Tabs (Cyclobenzaprine hcl) .Marland Kitchen.. 1 tablet by mouth three times a day as needed pain 7)  Naprosyn 500 Mg Tabs (Naproxen) .Marland Kitchen.. 1 tab by mouth two times a day as needed pain  Other Orders: Influenza Vaccine NON MCR (86578)  Patient Instructions: 1)  It was nice to see you today. 2)  For your pain, take the flexeril and naproxen I prescribed you. 3)  We will help you set up a physical therapy appointment. 4)  If your symptoms get worse, or if you have groin numbness, lose control of bowel or bladder, seek medical attention immediately. 5)  Be sure to keep your follow up with Dr. Mayford Knife.   Prescriptions: NAPROSYN 500 MG TABS (NAPROXEN) 1 tab by mouth two times a day as needed pain  #60 x 1   Entered and Authorized by:   Asher Muir MD   Signed by:   Asher Muir MD on 10/17/2009   Method used:   Electronically to        [facility] [phone]* (retail)       [address]       [place], [place]  [address]       Ph: [phone]       Fax: [phone]   RxID:   [id number] CYCLOBENZAPRINE HCL 5 MG TABS (CYCLOBENZAPRINE HCL) 1 tablet by mouth three times a day as needed pain  #90 x 1   Entered and Authorized by:   Asher Muir MD   Signed by:   Asher Muir MD on 10/17/2009   Method used:   Electronically to        [facility] [phone]* (retail)       [address]       [place], [place]  [address]       Ph: [phone]       Fax: [phone]   RxID:   [id number]    Influenza Vaccine    Vaccine Type: Fluvax Non-MCR    Site: left deltoid    Mfr: GlaxoSmithKline    Dose: 0.5 ml    Route: IM    Given by: Dennison Nancy RN    Exp. Date: 06/25/2010    Lot #: [id number]    VIS given: 8.10.10  Flu Vaccine Consent Questions    Do you have a history of severe allergic reactions to this vaccine? no    Any prior history of allergic  reactions to egg and/or gelatin? no  Do you have a sensitivity to the preservative Thimersol? no    Do you have a past history of Guillan-Barre Syndrome? no    Do you currently have an acute febrile illness? no    Have you ever had a severe reaction to latex? no    Vaccine information given and explained to patient? yes    Are you currently pregnant? no

## 2011-01-26 NOTE — Assessment & Plan Note (Signed)
Summary: NP6/ HYSTEROSCOPY, [place]/ PT HAS [facility]/ GD   Visit Type:  Initial Consult Referring Provider:  Dr. Dois Davenport A. Rivard Primary Provider:  Dr. Karn Pickler  CC:  CAD.  History of Present Illness: The patient is a lovely 60 year old white female coronary disease and a "small" heart attack in 2000. She reports a PCI. However, I do not yet have these records. She was followed by our group for a few years after this but has not been seen in several years. She does not report having had any followup catheterization or stress test. She is due to have a hysteroscopy with possible D&C. She is referred for preoperative evaluation.  She says she is an active woman. She works at a [employer].  She says she does walking on the job and then walks for exercise at home. She vacuums and does other chores of daily living. With this she denies any chest pressure, neck or arm discomfort. She has none of the shoulder discomfort that was her previous angina. She does get some pains in her joints she relates to arthritis. She does have some chest burning which she relates to reflux. She denies any shortness of breath and has no PND or orthopnea. She has no palpitations, presyncope or syncope. She has no swelling or weight gain.  Current Medications (verified): 1)  Atenolol 25 Mg Tabs (Atenolol) .... Qd 2)  Crestor 20 Mg Tabs (Rosuvastatin Calcium) .... Take One Tab Evening 3)  Levothroid 125 Mcg Tabs (Levothyroxine Sodium) .... Take One Tab A.m.  Allergies (verified): No Known Drug Allergies  Past History:  Past Medical History:    CAD (s/p PCI records pending)    Hypothyroidism    Hyperlipidemia    Endometrial hyperplasia  Family History:    Father died from MI age 40    Mother died age 31 Diabetes, cause of death unclear, Hx of MIs    Brother died age 50 MI  Social History:    Married    5 Children    Therapist, occupational    Quit Tobacco 20 year ago after 25 years 1 ppd  Review of Systems       GERD.   Otherwise as stated in the history of present illness and negative for all other systems.  Vital Signs:  Patient profile:   60 year old female Height:      64 inches Weight:      186 pounds BMI:     32.04 Pulse rate:   58 / minute BP sitting:   124 / 76  (left arm)  Vitals Entered By: Oswald Hillock (Apr 29, 2009 9:09 AM)  Nutrition Counseling: Patient's BMI is greater than 25 and therefore counseled on weight management options.  Physical Exam  General:  Well developed, well nourished, in no acute distress. Head:  normocephalic and atraumatic Eyes:  PERRLA/EOM intact; conjunctiva and lids normal. Mouth:  Edentulous, gums and palate normal. Oral mucosa normal. Neck:  Neck supple, no JVD. No masses, thyromegaly or abnormal cervical nodes. Chest Wall:  no deformities or breast masses noted Lungs:  Clear bilaterally to auscultation and percussion. Abdomen:  Bowel sounds positive; abdomen soft and non-tender without masses, organomegaly, or hernias noted. No hepatosplenomegaly. Msk:  Back normal, normal gait. Muscle strength and tone normal. Extremities:  No clubbing or cyanosis. Neurologic:  Alert and oriented x 3. Skin:  Intact without lesions or rashes. Cervical Nodes:  no significant adenopathy Axillary Nodes:  no significant adenopathy Inguinal Nodes:  no  significant adenopathy Psych:  Normal affect.   Detailed Cardiovascular Exam  Neck    Carotids: Carotids full and equal bilaterally without bruits.      Neck Veins: Normal, no JVD.    Heart    Inspection: no deformities or lifts noted.      Palpation: normal PMI with no thrills palpable.      Auscultation: regular rate and rhythm, S1, S2 without murmurs, rubs, gallops, or clicks.    Vascular    Abdominal Aorta: no palpable masses, pulsations, or audible bruits.      Femoral Pulses: normal femoral pulses bilaterally.      Pedal Pulses: normal pedal pulses bilaterally.      Radial Pulses: normal radial pulses  bilaterally.      Peripheral Circulation: no clubbing, cyanosis, or edema noted with normal capillary refill.     Impression & Recommendations:  Problem # 1:  CAD (ICD-414.00) The patient has had coronary disease 10 years ago. She does get some shoulder and chest discomfort that is most likely arthritis and reflux although I could not absolutely exclude coronary disease given her past history. Given this is the pretest probability of obstructive coronary disease is somewhat moderate. Therefore, stress testing with perfusion study is indicated preoperatively. She will continue with aggressive risk reduction as well.  Problem # 2:  HYPERLIPIDEMIA (ICD-272.4) I did review her lipid profile. Her most recent LDL was 89 with an HDL of 63. This was done in June of last year. This is a reasonable profile. She should have this checked yearly.  Problem # 3:  OBESITY, UNSPECIFIED (ICD-278.00) We discussed her BMI and strategies for weight loss with exercise and diet.  Problem # 4:  ESSENTIAL HYPERTENSION, BENIGN (ICD-401.1) Her blood pressure is slightly elevated but I reviewed previous labs. This is not the usual case. This would come down further with weight loss. She understands the goal of 140/90. This can be managed expectantly with med changes if she is not consistently at that target.  Other Orders: Nuclear Stress Test (Nuc Stress Test)  Patient Instructions: 1)  Your physician recommends that you schedule a follow-up appointment in: as needed 2)  Your physician has requested that you have an exercise stress myoview.  For further information please visit [email or web address]  Please follow instruction sheet, as given.

## 2011-01-26 NOTE — Letter (Signed)
Summary: Out of Work  All     ,     Phone:   Fax:     01/03/2008  TO: WHOM IT MAY CONCERN  RE: [name] [address] [place]       The above named individual is currently under my care and will be out of work    FROM: 01/03/2008   THROUGH: 01/09/08    REASON: Medical reasons    MAY RETURN ON: 01/10/08     If you have any further questions or need additional information, please call.     Sincerely,   JESSICA TRICHE MD typed by: AMY MARTIN RN

## 2011-01-26 NOTE — Progress Notes (Signed)
Summary: Triage  Phone Note Call from Patient Call back at [phone]   Reason for Call: Talk to Nurse Summary of Call: pt is requesting to speak with RN, sts she is having pain in her back and went to the ER but they advised she see her PCP Initial call taken by: ERIN LEVAN,  January 02, 2008 8:49 AM  Follow-up for Phone Call        pt went to urgent care yesterday for low back pain with radiation down to rt hip and leg. was given Ultram for pain. not helping. appointment scheduled tomorrow for work in appointment to follow up. Follow-up by: Theresia Lo RN,  January 02, 2008 9:24 AM

## 2011-01-26 NOTE — Progress Notes (Signed)
Summary: Records Request  Faxed Surgical Clearance to Dr. Tye Savoy at [facility] ([phone]). Debby Freiberg  October 15, 2010 3:54 PM

## 2011-01-26 NOTE — Letter (Signed)
Summary: ENDO BX Letter  [facility]  [address]   [place], [place] [address]   Phone: [phone]  Fax: [phone]    10/10/2008  [name] [address] [place], [place]  [address]  Dear [name],  The endometrial biopsy was normal, no sign of worrisome cells. You DID have an abnormal ultrasound as we discussed, so I think we need to continue to follow this. I recommend we do a repeat pelvic ultrasound in March--if the lining is still thick on that test, we should probably send you for an appointment with a gynecologist. If you have any bleeding from the vagina in the mentime, please let us know.  Please see Dr Karin Lieu in February so you can set up the ultrasound.         Sincerely,   Denny Levy MD [facility]  Appended Document: ENDO BX Letter sent

## 2011-01-26 NOTE — Progress Notes (Signed)
Summary: Rx Req  Phone Note Call from Patient Call back at [phone]   Summary of Call: pt requesting more pain pills till she comes in to see Dr. Mayford Knife next week. Initial call taken by: Clydell Hakim,  October 16, 2009 4:15 PM  Follow-up for Phone Call        will forward message to MD. Follow-up by: Theresia Lo RN,  October 16, 2009 4:46 PM  Additional Follow-up for Phone Call Additional follow up Details #1::        Ms. Larita Fife, could you call patient back and ask her what dose she is currently taking of the tramadol or could you send me an updated phone number?  I called the number listed in her records and I could not get through and I did not originally prescribe the tramadol. Additional Follow-up by: Magnus Ivan MD,  October 16, 2009 5:02 PM    Additional Follow-up for Phone Call Additional follow up Details #2::    called patient and she states she has an appointment for work in this AM and will discuss with MD. Follow-up by: Theresia Lo RN,  October 17, 2009 9:36 AM

## 2011-01-26 NOTE — Progress Notes (Signed)
Summary: re: clearance  Phone Note Call from Patient Call back at Home Phone [phone]   Caller: Patient Reason for Call: Talk to Nurse Summary of Call: pt is calling for update re: paper left from [facility] for Clearance. Initial call taken by: Roe Coombs,  October 07, 2010 3:14 PM  Follow-up for Phone Call        10/07/10--4pm--pt calling inquiring about surgical clearance--advised, paperwork found and given to christy tuck to give to dr hochrein to sign clearance--pt will call pam 10/13/10 to see if paperwork is ready and would also like [place] to fax copy to [facility] as that is her PCP---nt Follow-up by: Ledon Snare, RN,  October 07, 2010 4:48 PM    -------------------------------------------------------------------------------------------------------------Marland Kitchen

## 2011-01-26 NOTE — Progress Notes (Signed)
  Walk in Patient Form Recieved " Pt left paper from [facility] for Clearance" sent to [facility]  September 30, 2010 1:03 PM

## 2011-01-26 NOTE — Progress Notes (Signed)
Summary: Appt  Phone Note Call from Patient Call back at [phone]   Summary of Call: Needs a biopsy of uterus scheduled, wants to discuss Initial call taken by: Haydee Salter,  September 23, 2008 11:51 AM  Follow-up for Phone Call        pt advised by Dr Karn Pickler to make an appt foran endometrial biopsy. She will make an appt Follow-up by: Alphia Kava,  September 23, 2008 12:08 PM

## 2011-01-26 NOTE — Assessment & Plan Note (Signed)
Summary: Cardiology Nuclear Testing [facility] ORDERED  Nuclear Med Background Indications for Stress Test: Evaluation for Ischemia, Surgical Clearance  Indications Comments: 05/08/09 Hysterosopy with Dr. Dois Davenport Rivard  History: Angioplasty, Heart Catheterization, Myocardial Infarction, Myocardial Perfusion Study  History Comments: '99 DMI>PTCA-RCA;'00 MPS:(-),EF=66%  Symptoms: Chest Pain, Fatigue, Palpitations  Symptoms Comments: chest pain vs reflux   Nuclear Pre-Procedure Cardiac Risk Factors: Family History - CAD, History of Smoking, Hypertension, Lipids, Obesity Caffeine/Decaff Intake: none NPO After: 9:00 PM Lungs: clear IV 0.9% NS with Angio Cath: 22g     IV Site: (R) AC IV Started by: Irean Hong RN Chest Size (in) 38     Cup Size D     Height (in): 63.5 Weight (lb): 183 BMI: 32.02 Tech Comments: Atenolol held x 21 hours  Nuclear Med Study 1 or 2 day study:  1 day     Stress Test Type:  Stress Reading MD:  Olga Millers, MD     Referring MD:  Rollene Rotunda, MD Resting Radionuclide:  Technetium 83m Tetrofosmin     Resting Radionuclide Dose:  8 mCi  Stress Radionuclide:  Technetium 69m Tetrofosmin     Stress Radionuclide Dose:  27.5 mCi   Stress Protocol Exercise Time (min):  5:16 min     Max HR:  144 bpm     Predicted Max HR: 160 bpm  Max Systolic BP: 203 mm Hg     % Max HR:  90 %     METS: 7.0 Rate Pressure Product:  29232    Stress Test Technologist:  Rea College CMA-N     Nuclear Technologist:  Domenic Polite CNMT  Rest Procedure  Myocardial perfusion imaging was performed at rest 45 minutes following the intraveneous administration of Myoview Technetium 82m Tetrofosmin.  Stress Procedure  The patient exercised for 5:16.  The patient stopped due to dyspnea with an O2 SAT of 91%.  She denied any chest pain.  There were no diagnostic ST-T wave changes, only nonspecific changes with  occasional PVCs.  She did have a HTN response to exercise while off her atenolol x  24 hours of 203/84 at peak exercise.  Myoview was injected at peak exercise and myocardial perfusion imaging was performed after a brief delay.  QPS Raw Data Images:  There is no interference from other nuclear activity. Stress Images:  There is normal uptake in all areas. Rest Images:  Normal homogeneous uptake in all areas of the myocardium. Subtraction (SDS):  No evidence of ischemia. Transient Ischemic Dilatation:  .98  (Normal <1.22)  Lung/Heart Ratio:  .34  (Normal <0.45)  Quantitative Gated Spect Images QGS EDV:  71 ml QGS ESV:  21 ml QGS EF:  71 % QGS cine images:  Normal wall motion.   Overall Impression  Exercise Capacity: Poor exercise capacity. BP Response: Hypertensive blood pressure response. Clinical Symptoms: There is dyspnea. ECG Impression: No significant ST segment change suggestive of ischemia. Overall Impression: There is no sign of scar or ischemia.  Appended Document: Cardiology Nuclear Testing [facility] ORDERED OK.  No furhter cardiac testing.  Appended Document: Cardiology Nuclear Testing [facility] ORDERED LM OF RESULTS ON PT'S VM WITH INSTRUCTIONS TO B/C WITH QUESTIONS

## 2011-01-26 NOTE — Progress Notes (Signed)
Summary: Requesting to speak with somenone  Phone Note Call from Patient Call back at [phone]   Summary of Call: Would like to speak with someone about procedure she just had done. Initial call taken by: Haydee Salter,  October 03, 2008 4:10 PM  Follow-up for Phone Call        Pt was seen 10/01/08  and states she continues to have spotting enough to wear a pad. Small amount of blood. One pad used daily.. She has no cramping and no blood clots. Advised this was normal. She will call baak if she continues to bleed and if she uses more than 1 pad and has to change it frequently because of excessive flow. she will go to ER. Follow-up by: Alphia Kava,  October 03, 2008 5:16 PM

## 2011-01-26 NOTE — Assessment & Plan Note (Signed)
Summary: f/u,df   Vital Signs:  Patient profile:   60 year old female Height:      63 inches Weight:      181.38 pounds BMI:     32.25 BSA:     1.86 Temp:     98.6 degrees F Pulse rate:   52 / minute BP sitting:   148 / 78  Vitals Entered By: Jone Baseman CMA (October 15, 2010 1:39 PM) CC: f/u Is Patient Diabetic? No Pain Assessment Patient in pain? yes     Location: left hip Intensity: 5   Primary Provider:  Barnabas Lister MD  CC:  f/u.  History of Present Illness: CC: f/u hip pain  HPI: This is a 60 yo female who comes to clinic for f/u hip pain.  At her last visit, we reviewed her films which were showed severe DJD and findings c/w ? avascular necrosis.  I referred pt to orthopedic surgery to evaluate pt for possible THR.  Dr. Madelon Lips was concerned that the cyst-like mass in her L hip was malignant, so he ordered a CT pelvis and bone scan, which were negative for ca.  He diagnosed the mass as an "arthritic" cyst.  Pt is here for surgical clearance.  However, I explained to her that Dr. Antoine Poche, cardiology, would be the person to fill out the forms.  She understood, but just wanted to keep her PCP updated.  I did receive the fax from [facility] that states pt is medically cleared for surgery from a medical and cardiac standpoint.  New meds added per ortho: Meloxicam 15 and Hydrocodone/Acet 5/325 as needed.  ROS: endorses L hip and knee pain, and mild symptoms of incontinence.  Denies any fever, CP, SOB, abdominal pain, N/V, diarrhea or constipation.   Habits & Providers  Alcohol-Tobacco-Diet     Alcohol drinks/day: 0     Tobacco Status: quit > 6 months     Tobacco Counseling: to remain off tobacco products     Year Quit: 1988  Allergies (verified): No Known Drug Allergies  Past History:  Past Medical History: Last updated: 07/20/2010 CAD (ICD-414.00) MYOCARDIAL INFARCTION, OLD (ICD-412) HEALTH MAINTENANCE EXAM (ICD-V70 LEG PAIN, LEFT  (ICD-729.5) DIZZINESS (ICD-780.4) HYPERLIPIDEMIA (ICD-272.4) ESSENTIAL HYPERTENSION, BENIGN (ICD-401.1) OBESITY, UNSPECIFIED (ICD-278.00) PREOPERATIVE EXAMINATION (ICD-V72.84) ENDOMETRIAL HYPERPLASIA UNSPECIFIED (ICD-621.30) ENDOMETRIAL HYPERPLASIA UNSPECIFIED (ICD-621.30) HIP PAIN, BILATERAL (ICD-719.45) HYPOTHYROIDISM, UNSPECIFIED (ICD-244.9) HYPERCHOLESTEROLEMIA (ICD-272.0) ESOPHAGITIS, UNSPECIFIED (ICD-530.10) BACK PAIN, LOW (ICD-724.2)  Past Surgical History: Last updated: 03/15/2008 BTL  Family History: Last updated: 10/23/2009 Father-MI at 67 Mother-DM, heart problems, HTN Brothers- intestinal and throat cancer Older brother-bladder tumors (not malignant)  Social History: Last updated: 02/23/2010 Married 5 Children [employer] Quit Tobacco 20 year ago after 25 years 1 ppd pt was helping raise two granddaughters and now  1 granddaughter lives with her mother and another does not permanently stay in house (02/23/09)  Review of Systems       per hpi  Physical Exam  General:  Well-developed,well-nourished,in no acute distress Eyes:  No corneal or conjunctival inflammation noted. EOMI. Perrla.  Mouth:  Oral mucosa and oropharynx without lesions or exudates.   Lungs:  Normal respiratory effort, chest expands symmetrically. Lungs are clear to auscultation, no crackles or wheezes. Heart:  Normal rate and regular rhythm. S1 and S2 normal without gallop, murmur, click, rub or other extra sounds. Abdomen:  Bowel sounds positive,abdomen soft and non-tender without masses, organomegaly or hernias noted. Msk:  no joint swelling, decreased ROM, and joint tenderness.  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 Antoine Poche.  He has  cleared her for elective THR.  Pt will schedule her surgery with Dr. Madelon Lips as soon as he receives her paperwork.  Pt has been dx with a "arthritic cyst".  Ortho has prescribed Hydrocodone/Aceta 5/325 and Mobic 15, both as needed for pain.  Will continue with symptom relief.  Will f/u with Ortho s/p THR.  Will f/u with pt 6 mo s/p surgery or when she is able to come to clinic.  The following medications were removed from the medication list:    Naprosyn 500 Mg Tabs (Naproxen) .Marland Kitchen... 1 tab by mouth two times a day for one week as needed pain Her updated medication list for this problem includes:    Bayer Aspirin 325 Mg Tabs (Aspirin) .Marland Kitchen... Take 1 tablet by mouth once a day    Mobic 15 Mg Tabs (Meloxicam) .Marland Kitchen... Take 1 tab daily with food    Hydrocodone-acetaminophen 5-325 Mg Tabs (Hydrocodone-acetaminophen) .Marland Kitchen... Take 1 tab at bedtime as needed for pain  Problem # 2:  ATRIAL FIBRILLATION (ICD-427.31) Assessment: Unchanged Pt dx w/ paroxysmal atrial fibrillation per [facility].  She also sees Cards for CAD.  She is not on coumadin b/c Cards wants to monitor for now (per patient).  If patient were to become symptomatic or convert back into afib, may consider coumadin.  Her updated medication list for this problem includes:    Bayer Aspirin 325 Mg Tabs (Aspirin) .Marland Kitchen... Take 1 tablet by mouth once a day    Atenolol 50 Mg Tabs (Atenolol) .Marland Kitchen... Take one tablet by mouth daily  Orders: [facility]- Est Level  3 (14782)  Problem # 3:  ESSENTIAL HYPERTENSION, BENIGN (ICD-401.1) Assessment: Deteriorated BP 148/78 which is elevated from last visit 118/72.  Will continue Atenolol for now, and may consider adding Lisinopril 5 or 12.5mg  if BP continues to trend up.   Her updated medication list for this problem includes:    Atenolol 50 Mg Tabs (Atenolol) .Marland Kitchen... Take one tablet by mouth daily  Orders: [facility]- Est Level  3 (95621)  Complete Medication List: 1)  Bayer Aspirin 325 Mg Tabs (Aspirin) .... Take 1  tablet by mouth once a day 2)  Crestor 20 Mg Tabs (Rosuvastatin calcium) .... 2 tabs by mouth daily 3)  Levothyroxine Sodium 125 Mcg Tabs (Levothyroxine sodium) .... Take 1 tablet by mouth once a day 4)  Atenolol 50 Mg Tabs (Atenolol) .... Take one tablet by mouth daily 5)  Claritin 10 Mg Tabs (Loratadine) .... Take 1 pill daily for itching 6)  Fluticasone Propionate 50 Mcg/act Susp (Fluticasone propionate) .... 2 sprays in each nostril daily for allergies 7)  Mobic 15 Mg Tabs (Meloxicam) .... Take 1 tab daily with food 8)  Hydrocodone-acetaminophen 5-325 Mg Tabs (Hydrocodone-acetaminophen) .... Take 1 tab at bedtime as needed for pain  Patient Instructions: 1)  It was nice to see you today. 2)  Please schedule an appointment to see me 6 months after surgery. 3)  Please pick up your refills at your pharmacy. 4)  THank you. Prescriptions: CRESTOR 20 MG TABS (ROSUVASTATIN CALCIUM) 2 tabs by mouth daily  #60 x 3   Entered and Authorized by:   Ivy de Lawson Radar  MD   Signed by:  Barnabas Lister  MD on 10/15/2010   Method used:   Electronically to        [facility] [phone]* (retail)       [address]       [place], [place]  [address]       Ph: [phone]       Fax: [phone]   RxID:   [id number]    Orders Added: 1)  [facility]- Est Level  3 [87564]

## 2011-01-28 NOTE — Assessment & Plan Note (Signed)
Summary: [email or web address]   Visit Type:  Follow-up Primary Provider:  Barnabas Lister MD  CC:  Atrial fibrillation.  History of Present Illness: The patient presents for followup of atrial fibrillation. She has had this paroxysmally in the past. However, she had a normal echocardiogram. Her thromboembolic risk was felt to be low. She did have another paroxysm recently at the time of hip surgery. However, this was self limited. She does otherwise not feel tachypalpitations. She has had no presyncope or syncope. She denies any chest pressure, neck or arm discomfort. She denies any shortness of breath, PND or orthopnea. She is working with physical therapy.  Current Medications (verified): 1)  Crestor 20 Mg Tabs (Rosuvastatin Calcium) .... 2 Tabs By Mouth Daily 2)  Levothyroxine Sodium 125 Mcg Tabs (Levothyroxine Sodium) .... Take 1 Tablet By Mouth Once A Day 3)  Atenolol 50 Mg Tabs (Atenolol) .... Take One Tablet By Mouth Daily 4)  Claritin 10 Mg Tabs (Loratadine) .... Take 1 Pill Daily For Itching 5)  Fluticasone Propionate 50 Mcg/act Susp (Fluticasone Propionate) .... 2 Sprays in Each Nostril Daily For Allergies 6)  Mobic 15 Mg Tabs (Meloxicam) .... Take 1 Tab Daily With Food 7)  Hydrocodone-Acetaminophen 5-325 Mg Tabs (Hydrocodone-Acetaminophen) .... Take 1 Tab At Bedtime As Needed For Pain 8)  Xarelto 10 Mg Tabs (Rivaroxaban) .... Uad  Allergies (verified): No Known Drug Allergies  Past History:  Past Medical History: Reviewed history from 07/20/2010 and no changes required. CAD (ICD-414.00) MYOCARDIAL INFARCTION, OLD (ICD-412) HEALTH MAINTENANCE EXAM (ICD-V70 LEG PAIN, LEFT (ICD-729.5) DIZZINESS (ICD-780.4) HYPERLIPIDEMIA (ICD-272.4) ESSENTIAL HYPERTENSION, BENIGN (ICD-401.1) OBESITY, UNSPECIFIED (ICD-278.00) PREOPERATIVE EXAMINATION (ICD-V72.84) ENDOMETRIAL HYPERPLASIA UNSPECIFIED (ICD-621.30) ENDOMETRIAL HYPERPLASIA UNSPECIFIED (ICD-621.30) HIP PAIN, BILATERAL  (ICD-719.45) HYPOTHYROIDISM, UNSPECIFIED (ICD-244.9) HYPERCHOLESTEROLEMIA (ICD-272.0) ESOPHAGITIS, UNSPECIFIED (ICD-530.10) BACK PAIN, LOW (ICD-724.2)  Past Surgical History: BTL Left total hip replacement  Review of Systems       As stated in the HPI and negative for all other systems.   Vital Signs:  Patient profile:   60 year old female Height:      63 inches Weight:      179 pounds BMI:     31.82 Pulse rate:   60 / minute Resp:     16 per minute BP sitting:   88 / 62  (right arm)  Vitals Entered By: Marrion Coy, CNA (January 04, 2011 9:34 AM)  Physical Exam  General:  Well developed, well nourished, in no acute distress. Head:  normocephalic and atraumatic Neck:  Neck supple, no JVD. No masses, thyromegaly or abnormal cervical nodes. Chest Wall:  no deformities or breast masses noted Lungs:  Clear bilaterally to auscultation and percussion. Heart:  Non-displaced PMI, chest non-tender; regular rate and rhythm, S1, S2 without murmurs, rubs or gallops. Carotid upstroke normal, no bruit. Normal abdominal aortic size, no bruits. Femorals normal pulses, no bruits. Pedals normal pulses. No edema, no varicosities. Abdomen:  Bowel sounds positive; abdomen soft and non-tender without masses, organomegaly, or hernias noted. No hepatosplenomegaly. Msk:  Back normal, normal gait. Muscle strength and tone normal. Extremities:  No clubbing or cyanosis. Neurologic:  Alert and oriented x 3. Skin:  Intact without lesions or rashes. Cervical Nodes:  no significant adenopathy   EKG  Procedure date:  01/04/2011  Findings:      sinus rhythm, rate 62, axis within normal limits, intervals within normal limits, no acute ST-T wave changes  Impression & Recommendations:  Problem # 1:  ATRIAL FIBRILLATION (ICD-427.31) She has had  2 paroxysms of this the last after surgery. She has a very low thromboembolic risk and at this point she can be managed with beta blocker and aspirin alone. I  would consider changes based on further events. Orders: EKG w/ Interpretation (93000)  Problem # 2:  CAD (ICD-414.00) I was never able to find record of her previous reported PCI. However, she had a normal echocardiogram last year and a negative stress perfusion study without ischemia or infarct in 2010. She has no symptoms. She can continue with risk reduction. Orders: EKG w/ Interpretation (93000)  Problem # 3:  ESSENTIAL HYPERTENSION, BENIGN (ICD-401.1) Her blood pressure is actually running low. Is not having any symptoms related to this. She'll let us know if she has any lightheadedness but for now she'll continue meds as listed. She says her typical systolic blood pressure has recently been in the 120s.  Patient Instructions: 1)  Your physician recommends that you schedule a follow-up appointment as needed 2)  Your physician recommends that you continue on your current medications as directed. Please refer to the Current Medication list given to you today.

## 2011-01-28 NOTE — Progress Notes (Signed)
  DDS request received sent to [facility]  January 22, 2011 8:32 AM

## 2011-02-09 NOTE — Discharge Summary (Signed)
  NAMESTEPHONIE, [name]               ACCOUNT NO.:  [id number]  MEDICAL RECORD NO.:  [id number]          PATIENT TYPE:  INP  LOCATION:  5017                         FACILITY:  [facility]  PHYSICIAN:  Dyke Brackett, M.D.    DATE OF BIRTH:  1951/07/24  DATE OF ADMISSION:  12/11/2010 DATE OF DISCHARGE:  12/14/2010                              DISCHARGE SUMMARY   ADMITTING DIAGNOSIS:  Left hip arthritis.  DISCHARGE AND FINAL DIAGNOSIS:  Status post left hip replacement, left hip arthritis.  PROCEDURE:  The patient came to the operating room in stable condition, had a left total hip replacement, DePuy products and no complications and transferred to the PACU in stable condition and in turn going up to 5000.  HOSPITAL COURSE:  [name] is a 60 year old female status post left total hip replacement.  She had an uneventful hospital course.  First postop day, her hemoglobin was 9.5, stable condition.  Second postop day, her hemoglobin was 8.7, still in stable condition, no problems. Third postop day, the patient's hemoglobin stabilized and continued to remain at 8.7.  The left lower extremity was neurovascularly intact. The incision looked fine and there was no bloody drainage.  The patient was discharged home on postoperative day #3.  The patient's med discharge summary was done on the E-chart including all medications the patient was supposed to continue including the fact that she is going to be on  Xarelto 10 mg tablets for 35 days postoperatively.  ASSESSMENT/PLAN:  [name] is a 60 year old female status post left hip replacement, discharged home in stable condition.  Follow up with Dr. Madelon Lips  2 weeks postop for staple removal.  Posterior hip precautions were done with physical therapy, weightbearing as tolerated in left knee immobilizer at night time for a total of 6 weeks postoperatively and as mentioned on Xarelto for a total of 35 days postoperatively for prevention of deep  venous thrombosis.     Sharol Given, PA   ______________________________ Dyke Brackett, M.D.    JBS/MEDQ  D:  01/06/2011  T:  01/07/2011  Job:  [id number]  Electronically Signed by Kateri Plummer PA on 01/15/2011 06:36:11 PM Electronically Signed by Lacretia Nicks. Early Ord M.D. on 02/09/2011 09:35:45 AM

## 2011-03-08 LAB — DIFFERENTIAL
Basophils Absolute: 0 10*3/uL (ref 0.0–0.1)
Basophils Relative: 0 % (ref 0–1)
Eosinophils Absolute: 0.2 10*3/uL (ref 0.0–0.7)
Eosinophils Relative: 3 % (ref 0–5)
Lymphocytes Relative: 21 % (ref 12–46)
Lymphs Abs: 1.7 10*3/uL (ref 0.7–4.0)
Monocytes Absolute: 0.8 10*3/uL (ref 0.1–1.0)
Monocytes Relative: 10 % (ref 3–12)
Neutro Abs: 5.3 10*3/uL (ref 1.7–7.7)
Neutrophils Relative %: 67 % (ref 43–77)

## 2011-03-08 LAB — CBC
HCT: 25.8 % — ABNORMAL LOW (ref 36.0–46.0)
HCT: 26.5 % — ABNORMAL LOW (ref 36.0–46.0)
HCT: 28.9 % — ABNORMAL LOW (ref 36.0–46.0)
HCT: 42 % (ref 36.0–46.0)
Hemoglobin: 13.4 g/dL (ref 12.0–15.0)
Hemoglobin: 8.7 g/dL — ABNORMAL LOW (ref 12.0–15.0)
Hemoglobin: 8.7 g/dL — ABNORMAL LOW (ref 12.0–15.0)
Hemoglobin: 9.5 g/dL — ABNORMAL LOW (ref 12.0–15.0)
MCH: 28.7 pg (ref 26.0–34.0)
MCH: 29.5 pg (ref 26.0–34.0)
MCH: 29.7 pg (ref 26.0–34.0)
MCH: 30.4 pg (ref 26.0–34.0)
MCHC: 31.9 g/dL (ref 30.0–36.0)
MCHC: 32.8 g/dL (ref 30.0–36.0)
MCHC: 32.9 g/dL (ref 30.0–36.0)
MCHC: 33.7 g/dL (ref 30.0–36.0)
MCV: 89.8 fL (ref 78.0–100.0)
MCV: 89.9 fL (ref 78.0–100.0)
MCV: 90.2 fL (ref 78.0–100.0)
MCV: 90.4 fL (ref 78.0–100.0)
Platelets: 153 10*3/uL (ref 150–400)
Platelets: 178 10*3/uL (ref 150–400)
Platelets: 194 10*3/uL (ref 150–400)
Platelets: 218 10*3/uL (ref 150–400)
RBC: 2.86 MIL/uL — ABNORMAL LOW (ref 3.87–5.11)
RBC: 2.93 MIL/uL — ABNORMAL LOW (ref 3.87–5.11)
RBC: 3.22 MIL/uL — ABNORMAL LOW (ref 3.87–5.11)
RBC: 4.67 MIL/uL (ref 3.87–5.11)
RDW: 12.5 % (ref 11.5–15.5)
RDW: 12.5 % (ref 11.5–15.5)
RDW: 12.6 % (ref 11.5–15.5)
RDW: 12.6 % (ref 11.5–15.5)
WBC: 11.2 10*3/uL — ABNORMAL HIGH (ref 4.0–10.5)
WBC: 11.9 10*3/uL — ABNORMAL HIGH (ref 4.0–10.5)
WBC: 8 10*3/uL (ref 4.0–10.5)
WBC: 8.9 10*3/uL (ref 4.0–10.5)

## 2011-03-08 LAB — COMPREHENSIVE METABOLIC PANEL
ALT: 24 U/L (ref 0–35)
AST: 24 U/L (ref 0–37)
Albumin: 4 g/dL (ref 3.5–5.2)
Alkaline Phosphatase: 63 U/L (ref 39–117)
BUN: 11 mg/dL (ref 6–23)
CO2: 28 mEq/L (ref 19–32)
Calcium: 10.2 mg/dL (ref 8.4–10.5)
Chloride: 103 mEq/L (ref 96–112)
Creatinine, Ser: 0.93 mg/dL (ref 0.4–1.2)
GFR calc Af Amer: >60 mL/min (ref >60)
GFR calc non Af Amer: >60 mL/min (ref >60)
Glucose, Bld: 96 mg/dL (ref 70–99)
Potassium: 4.8 mEq/L (ref 3.5–5.1)
Sodium: 142 mEq/L (ref 135–145)
Total Bilirubin: 0.8 mg/dL (ref 0.3–1.2)
Total Protein: 7.4 g/dL (ref 6.0–8.3)

## 2011-03-08 LAB — URINALYSIS, ROUTINE W REFLEX MICROSCOPIC
Bilirubin Urine: NEGATIVE
Glucose, UA: NEGATIVE mg/dL
Hgb urine dipstick: NEGATIVE
Ketones, ur: NEGATIVE mg/dL
Nitrite: NEGATIVE
Protein, ur: NEGATIVE mg/dL
Specific Gravity, Urine: 1.009 (ref 1.005–1.030)
Urobilinogen, UA: 0.2 mg/dL (ref 0.0–1.0)
pH: 5.5 (ref 5.0–8.0)

## 2011-03-08 LAB — BASIC METABOLIC PANEL
BUN: 10 mg/dL (ref 6–23)
BUN: 11 mg/dL (ref 6–23)
BUN: 19 mg/dL (ref 6–23)
CO2: 25 mEq/L (ref 19–32)
CO2: 28 mEq/L (ref 19–32)
CO2: 29 mEq/L (ref 19–32)
Calcium: 8 mg/dL — ABNORMAL LOW (ref 8.4–10.5)
Calcium: 8.2 mg/dL — ABNORMAL LOW (ref 8.4–10.5)
Calcium: 8.3 mg/dL — ABNORMAL LOW (ref 8.4–10.5)
Chloride: 102 mEq/L (ref 96–112)
Chloride: 105 mEq/L (ref 96–112)
Chloride: 105 mEq/L (ref 96–112)
Creatinine, Ser: 0.81 mg/dL (ref 0.4–1.2)
Creatinine, Ser: 0.84 mg/dL (ref 0.4–1.2)
Creatinine, Ser: 1.05 mg/dL (ref 0.4–1.2)
GFR calc Af Amer: >60 mL/min (ref >60)
GFR calc Af Amer: >60 mL/min (ref >60)
GFR calc Af Amer: >60 mL/min (ref >60)
GFR calc non Af Amer: 53 mL/min — ABNORMAL LOW (ref >60)
GFR calc non Af Amer: >60 mL/min (ref >60)
GFR calc non Af Amer: >60 mL/min (ref >60)
Glucose, Bld: 119 mg/dL — ABNORMAL HIGH (ref 70–99)
Glucose, Bld: 121 mg/dL — ABNORMAL HIGH (ref 70–99)
Glucose, Bld: 124 mg/dL — ABNORMAL HIGH (ref 70–99)
Potassium: 3.6 mEq/L (ref 3.5–5.1)
Potassium: 3.7 mEq/L (ref 3.5–5.1)
Potassium: 4 mEq/L (ref 3.5–5.1)
Sodium: 135 mEq/L (ref 135–145)
Sodium: 136 mEq/L (ref 135–145)
Sodium: 137 mEq/L (ref 135–145)

## 2011-03-08 LAB — ABO/RH: ABO/RH(D): B POS

## 2011-03-08 LAB — TYPE AND SCREEN
ABO/RH(D): B POS
Antibody Screen: NEGATIVE

## 2011-03-08 LAB — URINE CULTURE
Colony Count: NO GROWTH
Culture  Setup Time: 201112121022
Culture: NO GROWTH

## 2011-03-08 LAB — URINE MICROSCOPIC-ADD ON

## 2011-03-08 LAB — SURGICAL PCR SCREEN
MRSA, PCR: NEGATIVE
Staphylococcus aureus: POSITIVE — AB

## 2011-03-08 LAB — PROTIME-INR
INR: 0.99 (ref 0.00–1.49)
Prothrombin Time: 13.3 seconds (ref 11.6–15.2)

## 2011-03-08 LAB — APTT: aPTT: 28 seconds (ref 24–37)

## 2011-03-16 LAB — URINE MICROSCOPIC-ADD ON

## 2011-03-16 LAB — URINALYSIS, ROUTINE W REFLEX MICROSCOPIC
Bilirubin Urine: NEGATIVE
Glucose, UA: NEGATIVE mg/dL
Hgb urine dipstick: NEGATIVE
Ketones, ur: NEGATIVE mg/dL
Nitrite: NEGATIVE
Protein, ur: NEGATIVE mg/dL
Specific Gravity, Urine: 1.008 (ref 1.005–1.030)
Urobilinogen, UA: 0.2 mg/dL (ref 0.0–1.0)
pH: 5 (ref 5.0–8.0)

## 2011-03-16 LAB — DIFFERENTIAL
Basophils Absolute: 0 10*3/uL (ref 0.0–0.1)
Basophils Relative: 0 % (ref 0–1)
Eosinophils Absolute: 0.2 10*3/uL (ref 0.0–0.7)
Eosinophils Relative: 3 % (ref 0–5)
Lymphocytes Relative: 27 % (ref 12–46)
Lymphs Abs: 2.1 10*3/uL (ref 0.7–4.0)
Monocytes Absolute: 0.8 10*3/uL (ref 0.1–1.0)
Monocytes Relative: 10 % (ref 3–12)
Neutro Abs: 4.7 10*3/uL (ref 1.7–7.7)
Neutrophils Relative %: 60 % (ref 43–77)

## 2011-03-16 LAB — CBC
HCT: 37.4 % (ref 36.0–46.0)
Hemoglobin: 13.1 g/dL (ref 12.0–15.0)
MCHC: 35 g/dL (ref 30.0–36.0)
MCV: 88.9 fL (ref 78.0–100.0)
Platelets: 195 10*3/uL (ref 150–400)
RBC: 4.2 MIL/uL (ref 3.87–5.11)
RDW: 12.6 % (ref 11.5–15.5)
WBC: 7.8 10*3/uL (ref 4.0–10.5)

## 2011-03-16 LAB — BASIC METABOLIC PANEL
BUN: 12 mg/dL (ref 6–23)
CO2: 22 mEq/L (ref 19–32)
Calcium: 9 mg/dL (ref 8.4–10.5)
Chloride: 110 mEq/L (ref 96–112)
Creatinine, Ser: 1.05 mg/dL (ref 0.4–1.2)
GFR calc Af Amer: >60 mL/min (ref >60)
GFR calc non Af Amer: 53 mL/min — ABNORMAL LOW (ref >60)
Glucose, Bld: 115 mg/dL — ABNORMAL HIGH (ref 70–99)
Potassium: 3.8 mEq/L (ref 3.5–5.1)
Sodium: 140 mEq/L (ref 135–145)

## 2011-03-16 LAB — PROTIME-INR
INR: 1 (ref 0.00–1.49)
Prothrombin Time: 13.1 seconds (ref 11.6–15.2)

## 2011-03-16 LAB — CK TOTAL AND CKMB (NOT AT ARMC)
CK, MB: 4.2 ng/mL — ABNORMAL HIGH (ref 0.3–4.0)
CK, MB: 4.3 ng/mL — ABNORMAL HIGH (ref 0.3–4.0)
Relative Index: 2.6 — ABNORMAL HIGH (ref 0.0–2.5)
Relative Index: 2.9 — ABNORMAL HIGH (ref 0.0–2.5)
Total CK: 148 U/L (ref 7–177)
Total CK: 159 U/L (ref 7–177)

## 2011-03-16 LAB — TROPONIN I
Troponin I: 0.01 ng/mL (ref 0.00–0.06)
Troponin I: 0.02 ng/mL (ref 0.00–0.06)

## 2011-03-16 LAB — APTT: aPTT: 30 seconds (ref 24–37)

## 2011-04-06 LAB — CBC
HCT: 38.4 % (ref 36.0–46.0)
Hemoglobin: 13.4 g/dL (ref 12.0–15.0)
MCHC: 34.9 g/dL (ref 30.0–36.0)
MCV: 89.8 fL (ref 78.0–100.0)
Platelets: 203 10*3/uL (ref 150–400)
RBC: 4.28 MIL/uL (ref 3.87–5.11)
RDW: 12.4 % (ref 11.5–15.5)
WBC: 10.9 10*3/uL — ABNORMAL HIGH (ref 4.0–10.5)

## 2011-04-27 ENCOUNTER — Other Ambulatory Visit: Payer: Self-pay | Admitting: *Deleted

## 2011-04-27 DIAGNOSIS — E039 Hypothyroidism, unspecified: Secondary | ICD-10-CM

## 2011-04-27 MED ORDER — LEVOTHYROXINE SODIUM 125 MCG PO CAPS: 125.0000 ug | ORAL_CAPSULE | Freq: Every day | ORAL | Status: DC

## 2011-04-28 ENCOUNTER — Encounter: Payer: Self-pay | Admitting: Family Medicine

## 2011-05-06 ENCOUNTER — Ambulatory Visit (INDEPENDENT_AMBULATORY_CARE_PROVIDER_SITE_OTHER): Payer: 59 | Admitting: Family Medicine

## 2011-05-06 ENCOUNTER — Other Ambulatory Visit (HOSPITAL_COMMUNITY)
Admission: RE | Admit: 2011-05-06 | Discharge: 2011-05-06 | Disposition: A | Discharge status: Home / Self Care | Payer: 59 | Source: Ambulatory Visit | Attending: Family Medicine | Admitting: Family Medicine

## 2011-05-06 ENCOUNTER — Encounter: Payer: Self-pay | Admitting: Family Medicine

## 2011-05-06 VITALS — BP 125/76 | HR 68 | Temp 98.1°F | Ht 63.5 in | Wt 193.1 lb

## 2011-05-06 DIAGNOSIS — Z124 Encounter for screening for malignant neoplasm of cervix: Secondary | ICD-10-CM

## 2011-05-06 DIAGNOSIS — M169 Osteoarthritis of hip, unspecified: Secondary | ICD-10-CM

## 2011-05-06 DIAGNOSIS — N72 Inflammatory disease of cervix uteri: Secondary | ICD-10-CM

## 2011-05-06 DIAGNOSIS — I252 Old myocardial infarction: Secondary | ICD-10-CM

## 2011-05-06 DIAGNOSIS — E039 Hypothyroidism, unspecified: Secondary | ICD-10-CM

## 2011-05-06 DIAGNOSIS — I4891 Unspecified atrial fibrillation: Secondary | ICD-10-CM

## 2011-05-06 DIAGNOSIS — D126 Benign neoplasm of colon, unspecified: Secondary | ICD-10-CM

## 2011-05-06 DIAGNOSIS — Z23 Encounter for immunization: Secondary | ICD-10-CM

## 2011-05-06 DIAGNOSIS — R32 Unspecified urinary incontinence: Secondary | ICD-10-CM | POA: Insufficient documentation

## 2011-05-06 DIAGNOSIS — N3946 Mixed incontinence: Secondary | ICD-10-CM

## 2011-05-06 DIAGNOSIS — N760 Acute vaginitis: Secondary | ICD-10-CM

## 2011-05-06 DIAGNOSIS — L309 Dermatitis, unspecified: Secondary | ICD-10-CM | POA: Insufficient documentation

## 2011-05-06 DIAGNOSIS — N950 Postmenopausal bleeding: Secondary | ICD-10-CM

## 2011-05-06 DIAGNOSIS — Z0000 Encounter for general adult medical examination without abnormal findings: Secondary | ICD-10-CM | POA: Insufficient documentation

## 2011-05-06 DIAGNOSIS — K635 Polyp of colon: Secondary | ICD-10-CM | POA: Insufficient documentation

## 2011-05-06 DIAGNOSIS — Z01419 Encounter for gynecological examination (general) (routine) without abnormal findings: Secondary | ICD-10-CM | POA: Insufficient documentation

## 2011-05-06 LAB — POCT WET PREP (WET MOUNT)
Clue Cells Wet Prep HPF POC: NEGATIVE
Trichomonas Wet Prep HPF POC: NEGATIVE
Yeast Wet Prep HPF POC: NEGATIVE

## 2011-05-06 MED ORDER — ATENOLOL 50 MG PO TABS: 50.0000 mg | ORAL_TABLET | Freq: Every day | ORAL | Status: DC

## 2011-05-06 MED ORDER — TETANUS-DIPHTH-ACELL PERTUSSIS 5-2.5-18.5 LF-MCG/0.5 IM SUSP: 0.5000 mL | Freq: Once | INTRAMUSCULAR | Status: DC

## 2011-05-06 MED ORDER — LEVOTHYROXINE SODIUM 125 MCG PO CAPS: 125.0000 ug | ORAL_CAPSULE | Freq: Every day | ORAL | Status: DC

## 2011-05-06 MED ORDER — ZOSTER VACCINE LIVE 19400 UNT/0.65ML ~~LOC~~ SOLR: 0.6500 mL | Freq: Once | SUBCUTANEOUS | Status: AC

## 2011-05-06 MED ORDER — ROSUVASTATIN CALCIUM 40 MG PO TABS: 40.0000 mg | ORAL_TABLET | Freq: Every day | ORAL | Status: DC

## 2011-05-06 MED ORDER — TRIAMCINOLONE ACETONIDE 0.1 % EX OINT: TOPICAL_OINTMENT | CUTANEOUS | Status: DC

## 2011-05-06 NOTE — Assessment & Plan Note (Signed)
No specific lesion identifies today aside from excoriation.  No evidence of yeast.   Will prescribe topical steroid, advised follow-up if no improvement.

## 2011-05-06 NOTE — Assessment & Plan Note (Signed)
Patient had several episodes of light vaginal spotting, not provoked by trauma/sex.  Has workup (not bleeding at that time) by [facility] in 2010 which included hysteroscopy and D&C with benign findings.  Discussed that bleeding even if light could be sign of cancer and encouraged follow-up with OB/Gyn to discuss if further workup is needed.

## 2011-05-06 NOTE — Assessment & Plan Note (Signed)
History of colonic polyps, last colonoscopy in 2005 by Dr. Ewing Schlein who suggested repeat in 3 years.  Will place referral for repeat colonoscopy.

## 2011-05-06 NOTE — Patient Instructions (Addendum)
See information on bladder exercises, please schedule appt if you feel that this has not been effective Will schedule you colonoscopy with Dr. Ewing Schlein- let us know if you dont hear back See prescription for shingles vaccine Make lab appointment for fasting labwork- no eating or drinking except water and meds for 8 hours Please contact your OBGYN- Dr. Estanislado Pandy at [facility] to discuss vaginal bleeding

## 2011-05-06 NOTE — Assessment & Plan Note (Signed)
Gave tdap today and script for zostavax.  Needs colonoscopy and mammogram.  Performed pap.

## 2011-05-06 NOTE — Progress Notes (Signed)
  Subjective:    Patient ID: [name], female    DOB: 1951/12/20, 60 y.o.   MRN: [id number]  HPI Annual Gynecological Exam  G5P5 Wt Readings from Last 3 Encounters:  05/06/11 193 lb 1.6 oz (87.59 kg)  01/04/11 179 lb (81.194 kg)  10/15/10 181 lb 6.1 oz (82.274 kg)   Last period:  age 39 Regular periods: no Heavy bleeding: has had some light vaginal spotting  Sexually active: yes Birth control or hormonal therapy:no Hx of STD: Patient desires STD screening Dyspareunia: No Hot flashes: No Vaginal discharge:spotting, itching, no discharge Dysuria:No  Since surgery- has noted worsening stress and urge incontinence.  Last mammogram: 2009 Breast mass or concerns: No Last Pap: 2009 History of abnormal pap: not recently  FH of breast, uterine, ovarian, colon cancer: intestinal ca in brother  Denies hip pain since surgery, see problem overviews for more details on review of chronic medical conditions.  Review of Systems     Objective:   Physical Exam  Constitutional: She appears well-developed and well-nourished.  HENT:  Head: Normocephalic and atraumatic.  Right Ear: External ear normal.  Left Ear: External ear normal.  Nose: Nose normal.  Mouth/Throat: Oropharynx is clear and moist. No oropharyngeal exudate.  Eyes: Conjunctivae and EOM are normal. Pupils are equal, round, and reactive to light.  Neck: Normal range of motion. Neck supple. No thyromegaly present.  Cardiovascular: Normal rate, regular rhythm and normal heart sounds.   No murmur heard. Pulmonary/Chest: Effort normal and breath sounds normal. No respiratory distress. She has no wheezes. She has no rhonchi. She has no rales.  Abdominal: Soft. Bowel sounds are normal. There is no hepatosplenomegaly. There is no tenderness. There is no rebound and no guarding.  Genitourinary: Vagina normal and uterus normal. No breast swelling, tenderness or discharge. There is rash and tenderness on the right labia. There  is no rash, tenderness, lesion or injury on the left labia. Cervix exhibits no motion tenderness, no discharge and no friability. Right adnexum displays no mass, no tenderness and no fullness. Left adnexum displays no mass and no tenderness. No bleeding around the vagina. No vaginal discharge found.       Some irritation outside of right labia.  Lymphadenopathy:    She has no cervical adenopathy.          Assessment & Plan:

## 2011-05-06 NOTE — Assessment & Plan Note (Signed)
Gave kegel exercises, info on options for treatment of incontience, discussed timed voiding.  Patient will follow-up if quality of life decreases and needs further treatment

## 2011-05-07 ENCOUNTER — Encounter: Payer: Self-pay | Admitting: Family Medicine

## 2011-05-10 ENCOUNTER — Other Ambulatory Visit: Payer: 59

## 2011-05-10 DIAGNOSIS — I252 Old myocardial infarction: Secondary | ICD-10-CM

## 2011-05-10 DIAGNOSIS — N3946 Mixed incontinence: Secondary | ICD-10-CM

## 2011-05-10 LAB — COMPREHENSIVE METABOLIC PANEL
ALT: 15 U/L (ref 0–35)
AST: 16 U/L (ref 0–37)
Albumin: 3.9 g/dL (ref 3.5–5.2)
Alkaline Phosphatase: 66 U/L (ref 39–117)
BUN: 16 mg/dL (ref 6–23)
CO2: 25 mEq/L (ref 19–32)
Calcium: 9.1 mg/dL (ref 8.4–10.5)
Chloride: 105 mEq/L (ref 96–112)
Creat: 0.9 mg/dL (ref 0.40–1.20)
Glucose, Bld: 119 mg/dL — ABNORMAL HIGH (ref 70–99)
Potassium: 4.4 mEq/L (ref 3.5–5.3)
Sodium: 140 mEq/L (ref 135–145)
Total Bilirubin: 0.4 mg/dL (ref 0.3–1.2)
Total Protein: 6.5 g/dL (ref 6.0–8.3)

## 2011-05-10 LAB — LIPID PANEL
Cholesterol: 225 mg/dL — ABNORMAL HIGH (ref 0–200)
HDL: 48 mg/dL
LDL Cholesterol: 138 mg/dL — ABNORMAL HIGH (ref 0–99)
Total CHOL/HDL Ratio: 4.7 ratio
Triglycerides: 193 mg/dL — ABNORMAL HIGH
VLDL: 39 mg/dL (ref 0–40)

## 2011-05-10 LAB — TSH: TSH: 0.635 u[IU]/mL (ref 0.350–4.500)

## 2011-05-10 NOTE — Progress Notes (Signed)
FLP,CMP AND TSH DONE TODAY [name]

## 2011-05-11 ENCOUNTER — Telehealth: Payer: Self-pay | Admitting: *Deleted

## 2011-05-11 NOTE — Telephone Encounter (Signed)
Please schedule with Morton, and fwd colonoscopy report.

## 2011-05-11 NOTE — Op Note (Signed)
[name], [name]               ACCOUNT NO.:  [id number]   MEDICAL RECORD NO.:  [id number]          PATIENT TYPE:  AMB   LOCATION:  SDC                           FACILITY:  [facility]   PHYSICIAN:  Crist Fat. Rivard, M.D. DATE OF BIRTH:  28-Aug-1951   DATE OF PROCEDURE:  05/08/2009  DATE OF DISCHARGE:  05/08/2009                               OPERATIVE REPORT   PREOPERATIVE DIAGNOSIS:  Postmenopausal endometrial polyp.   POSTOPERATIVE DIAGNOSIS:  Postmenopausal endometrial polyp.   ANESTHESIA:  General, Dr. Tacy Dura.   PROCEDURE:  Hysteroscopy with resection of endometrial polyp and D and  C.   SURGEON:  Sandra A. Rivard, MD   ASSISTANT:  No assistant.   ESTIMATED BLOOD LOSS:  Minimal.   PROCEDURE:  After being informed of the planned procedure with possible  complications including bleeding, infection, and injury to uterus,  informed consent was obtained.  The patient was taken to OR #7.  Given  general anesthesia with laryngeal mask ventilation without complication.  She is placed in the lithotomy position, prepped, and draped in a  sterile fashion and her bladder is emptied with an in-and-out red rubber  catheter.  Pelvic exam reveals an anteverted small uterus and 2 normal  small adnexa.  A weighted speculum is inserted.  Anterior lip of the  cervix was grasped with a tenaculum forceps and the uterus was easily  sounded at 6.5 cm.  The cervix was then easily dilated using Hegar  dilator until #7 and #19 which allows easy entry of a diagnostic  hysteroscope.  With perfusion of sorbitol at a maximum pressure of 80  mmHg, we are able to visualize the entire uterine cavity with both tubal  ostia and note a predominant large polyp on the posterior wall of the  uterus measuring approximately 1.5 cm.  The diagnostic hysteroscope is  removed and the cervix is dilated until #31 to use the operative  hysteroscope with resectoscope.  The polyp is easily resected with a  single loop  resectoscope and then the hysteroscope is removed.  A D and  C is performed and the hysteroscope is reinserted to see a free cavity  with good hemostasis.   Instruments and sponge count is complete x2.  Estimated blood loss was  minimal.  Fluid deficit is 75 mL.  The procedure is very well tolerated  by the patient who is taken to recovery room in a well and stable  condition.   SPECIMEN:  Endometrial polyp and endometrial curettings sent to  pathology.      Crist Fat Rivard, M.D.  Electronically Signed    SAR/MEDQ  D:  05/08/2009  T:  05/09/2009  Job:  [id number]

## 2011-05-11 NOTE — Telephone Encounter (Signed)
Message copied by Jimmy Footman on Tue May 11, 2011 11:55 AM ------      Message from: Delbert Harness      Created: Thu May 06, 2011 12:58 PM       Referral for GI: Please schedule follow-up colonoscopy for patient- she saw Dr. Ewing Schlein in 2005 and was asked to come back in 3 years for repeat

## 2011-05-11 NOTE — Telephone Encounter (Signed)
[name] I called [facility] (Dr. Ewing Schlein) to set up a colonoscopy for patient that you have put in. I was informed that she was dismissed from practice in 2007 and when I asked if she could come back I was told no she will not be able to. I was given a number so that patient can call and find out why she was dismissed. Where would you prefer her to go or do? ---[place] Dec

## 2011-05-14 ENCOUNTER — Encounter: Payer: Self-pay | Admitting: Family Medicine

## 2011-05-14 DIAGNOSIS — R7301 Impaired fasting glucose: Secondary | ICD-10-CM | POA: Insufficient documentation

## 2011-05-14 DIAGNOSIS — E785 Hyperlipidemia, unspecified: Secondary | ICD-10-CM

## 2011-05-14 NOTE — Assessment & Plan Note (Signed)
Poor control despite being on maximal statin therapy.  Invited patient to make appt to discuss TLC to lower risk

## 2011-05-14 NOTE — Op Note (Signed)
NAMEJERRIKA, [name]               ACCOUNT NO.:  [id number]   MEDICAL RECORD NO.:  [id number]          PATIENT TYPE:  AMB   LOCATION:  ENDO                         FACILITY:  [facility]   PHYSICIAN:  Petra Kuba, M.D.    DATE OF BIRTH:  [birth year]   DATE OF PROCEDURE:  11/11/2004  DATE OF DISCHARGE:                                 OPERATIVE REPORT   PROCEDURE:  Colonoscopy with polypectomy.   INDICATION:  Bright red blood per rectum.  The patient due for colonic  screening.  Consent was signed after risks, benefits, methods, options  thoroughly discussed in the office.   MEDICINES USED:  1.  Demerol 75.  2.  Versed 7.   DESCRIPTION OF PROCEDURE:  Rectal inspection was pertinent for external  hemorrhoids, small.  Digital exam was negative.  The video pediatric  adjustable colonoscope was inserted and fairly easily advanced around the  colon to the cecum.  This did require rolling her on her back and some  abdominal pressure.  No obvious abnormality was seen on insertion nor any  signs of bleeding.  The cecum was identified by the appendiceal orifice and  the ileocecal valve.  The prep on the right side was fair.  There was some  stool adherent to the wall which could not be washed off.  The rest of the  prep was adequate.  On slow withdrawal through the colon, the cecum,  ascending, transverse, majority of the descending were normal.  At  approximately the sigmoid/descending junction, a small polyp was seen and  was hot biopsied x 1 and put in the first container.  The scope was slowly  withdrawn.  In the distal sigmoid, a small pedunculated polyp was seen,  snared, electrocautery applied, polyp suctioned through the scope and  collected in the trap.  This was put in the same container.  There were  multiple tiny rectal and distal sigmoid hyperplastic appearing polyps, some  of which were cold biopsied.  One in the distal rectum was hot biopsied and  put in a second container.   Anorectal pull-through and retroflexion  confirmed the hemorrhoids.  The scope was straightened, air was suctioned,  the scope removed.  The patient tolerated the procedure well.  There was no  obvious immediate complication.   ENDOSCOPIC DIAGNOSES:  1.  Internal and external hemorrhoids.  2.  Multiple hyperplastic rectal and distal sigmoid tiny polyps, some of      which were cold and one hot biopsied.  3.  Distal sigmoid small polyp, snared.  4.  Proximal sigmoid small polyp, hot biopsied.  5.  Otherwise, within normal limits to the cecum.   PLAN:  1.  Await pathology to determine future colonic screening.  2.  Happy to see back p.r.n.  3.  Go ahead and treat hemorrhoids in the usual fashion and follow up per      the medical team to recheck guaiac, CBC, symptoms and make sure no      further work-up plans are needed like a one-time endoscopy or upper GI  small-bowel follow-through to complete her gastrointestinal work-up and,      as above, happy to see back p.r.n.Marland Kitchen      MEM/MEDQ  D:  11/11/2004  T:  11/11/2004  Job:  [id number]   cc:   Penni Bombard, MD  Fax: [phone]

## 2011-05-25 NOTE — Telephone Encounter (Signed)
[name], I also just called [facility] to schedule and they also informed me that she has been dismissed from them with no chance of returning. So [facility] and [facility] will not accept her. I will try to find another GI office.  ---Huntley Dec

## 2011-05-26 NOTE — Telephone Encounter (Signed)
Please contact [facility].

## 2011-08-28 ENCOUNTER — Emergency Department (HOSPITAL_COMMUNITY)
Admission: EM | Admit: 2011-08-28 | Discharge: 2011-08-28 | Disposition: A | Discharge status: Home / Self Care | Payer: 59 | Attending: Emergency Medicine | Admitting: Emergency Medicine

## 2011-08-28 DIAGNOSIS — I10 Essential (primary) hypertension: Secondary | ICD-10-CM | POA: Insufficient documentation

## 2011-08-28 DIAGNOSIS — R002 Palpitations: Secondary | ICD-10-CM | POA: Insufficient documentation

## 2011-08-28 DIAGNOSIS — I4891 Unspecified atrial fibrillation: Secondary | ICD-10-CM | POA: Insufficient documentation

## 2011-08-28 DIAGNOSIS — I252 Old myocardial infarction: Secondary | ICD-10-CM | POA: Insufficient documentation

## 2011-08-28 LAB — COMPREHENSIVE METABOLIC PANEL
ALT: 15 U/L (ref 0–35)
AST: 17 U/L (ref 0–37)
Albumin: 3.3 g/dL — ABNORMAL LOW (ref 3.5–5.2)
Alkaline Phosphatase: 69 U/L (ref 39–117)
BUN: 20 mg/dL (ref 6–23)
CO2: 24 mEq/L (ref 19–32)
Calcium: 9.3 mg/dL (ref 8.4–10.5)
Chloride: 104 mEq/L (ref 96–112)
Creatinine, Ser: 0.87 mg/dL (ref 0.50–1.10)
GFR calc Af Amer: >60 mL/min (ref >60)
GFR calc non Af Amer: >60 mL/min (ref >60)
Glucose, Bld: 123 mg/dL — ABNORMAL HIGH (ref 70–99)
Potassium: 4.1 mEq/L (ref 3.5–5.1)
Sodium: 139 mEq/L (ref 135–145)
Total Bilirubin: 0.4 mg/dL (ref 0.3–1.2)
Total Protein: 7.1 g/dL (ref 6.0–8.3)

## 2011-08-28 LAB — CBC
HCT: 37.7 % (ref 36.0–46.0)
Hemoglobin: 12.8 g/dL (ref 12.0–15.0)
MCH: 29 pg (ref 26.0–34.0)
MCHC: 34 g/dL (ref 30.0–36.0)
MCV: 85.3 fL (ref 78.0–100.0)
Platelets: 223 10*3/uL (ref 150–400)
RBC: 4.42 MIL/uL (ref 3.87–5.11)
RDW: 13.2 % (ref 11.5–15.5)
WBC: 9 10*3/uL (ref 4.0–10.5)

## 2011-08-28 LAB — DIFFERENTIAL
Basophils Absolute: 0 10*3/uL (ref 0.0–0.1)
Basophils Relative: 0 % (ref 0–1)
Eosinophils Absolute: 0.3 10*3/uL (ref 0.0–0.7)
Eosinophils Relative: 3 % (ref 0–5)
Lymphocytes Relative: 35 % (ref 12–46)
Lymphs Abs: 3.2 10*3/uL (ref 0.7–4.0)
Monocytes Absolute: 1.2 10*3/uL — ABNORMAL HIGH (ref 0.1–1.0)
Monocytes Relative: 14 % — ABNORMAL HIGH (ref 3–12)
Neutro Abs: 4.3 10*3/uL (ref 1.7–7.7)
Neutrophils Relative %: 48 % (ref 43–77)

## 2011-08-28 LAB — POCT I-STAT TROPONIN I: Troponin i, poc: 0 ng/mL (ref 0.00–0.08)

## 2011-08-28 LAB — CK TOTAL AND CKMB (NOT AT ARMC)
CK, MB: 3.4 ng/mL (ref 0.3–4.0)
Relative Index: 3.4 — ABNORMAL HIGH (ref 0.0–2.5)
Total CK: 101 U/L (ref 7–177)

## 2011-08-31 ENCOUNTER — Telehealth: Payer: Self-pay | Admitting: Cardiology

## 2011-09-02 ENCOUNTER — Ambulatory Visit (INDEPENDENT_AMBULATORY_CARE_PROVIDER_SITE_OTHER): Payer: 59 | Admitting: Cardiology

## 2011-09-02 ENCOUNTER — Encounter: Payer: Self-pay | Admitting: Cardiology

## 2011-09-02 DIAGNOSIS — I4891 Unspecified atrial fibrillation: Secondary | ICD-10-CM

## 2011-09-02 DIAGNOSIS — I10 Essential (primary) hypertension: Secondary | ICD-10-CM

## 2011-09-02 DIAGNOSIS — E669 Obesity, unspecified: Secondary | ICD-10-CM

## 2011-09-02 MED ORDER — DABIGATRAN ETEXILATE MESYLATE 150 MG PO CAPS: 150.0000 mg | ORAL_CAPSULE | Freq: Two times a day (BID) | ORAL | Status: DC

## 2011-09-02 NOTE — Patient Instructions (Addendum)
Please stop your Asprin Start Pradaxa 150 mg one twice a day Continue all other medications as listed  Return for blood work in two weeks (CBC)  Follow up with Dr Antoine Poche in 2 months

## 2011-09-02 NOTE — Assessment & Plan Note (Signed)
This is now her third episode of fib.  She has a CHADS score of 1.  Guidelines including recent CHEST guidelines would support and suggest use of anticoagulation.  She and I discussed this at length.  She values the potential benefit of reducing embolic stroke very much and she would agree to Pradaxa.  She will remain on the other meds as listed.

## 2011-09-02 NOTE — Assessment & Plan Note (Signed)
We have discussed the importance of weight loss in the past.

## 2011-09-02 NOTE — Assessment & Plan Note (Signed)
The blood pressure is at target. No change in medications is indicated. We will continue with therapeutic lifestyle changes (TLC).  

## 2011-09-02 NOTE — Progress Notes (Signed)
HPI The patient presents for evaluation of atrial fibrillation.  She was seen in the hospital for this.  She has had this paroxysmally in the past.  She said she will continue felt weak like she been running a race. She knew something wasn't right. I did finally review the EKG report. She was in fibrillation with a rapid rate. She apparently slowed down with Cardizem and was sent home on p.o. Cardizem. She has since not felt any palpitations. She had no presyncope or syncope. She denies any chest pressure, neck or arm discomfort. She reports that she is under a lot of stress at home.  Allergies  Allergen Reactions  . Penicillins Itching  . Percocet (Oxycodone-Acetaminophen) Itching    Current Outpatient Prescriptions  Medication Sig Dispense Refill  . aspirin 325 MG tablet Take 325 mg by mouth daily.        Marland Kitchen atenolol (TENORMIN) 50 MG tablet Take 1 tablet (50 mg total) by mouth daily.  30 tablet  6  . diltiazem (DILACOR XR) 120 MG 24 hr capsule Take 120 mg by mouth daily.        . Levothyroxine Sodium 125 MCG CAPS Take 1 capsule (125 mcg total) by mouth daily.  30 capsule  6  . rosuvastatin (CRESTOR) 40 MG tablet Take 1 tablet (40 mg total) by mouth daily.  30 tablet  6  . triamcinolone (KENALOG) 0.1 % ointment Apply to affected area once per day for no longer than 2 weeks.  90 g  0  . loratadine (CLARITIN) 10 MG tablet Take 10 mg by mouth daily.         Current Facility-Administered Medications  Medication Dose Route Frequency Provider Last Rate Last Dose  . TDaP (BOOSTRIX) injection 0.5 mL  0.5 mL Intramuscular Once Delbert Harness, MD        No past medical history on file.  Past Surgical History  Procedure Date  . Joint replacement   . Tubal ligation     ROS: PHYSICAL EXAM BP 111/60  Pulse 59  Ht 5\' 3"  (1.6 m)  Wt 197 lb (89.359 kg)  BMI 34.90 kg/m2 GENERAL:  Well appearing HEENT:  Pupils equal round and reactive, fundi not visualized, oral mucosa unremarkable NECK:  No  jugular venous distention, waveform within normal limits, carotid upstroke brisk and symmetric, no bruits, no thyromegaly LYMPHATICS:  No cervical, inguinal adenopathy LUNGS:  Clear to auscultation bilaterally BACK:  No CVA tenderness CHEST:  Unremarkable HEART:  PMI not displaced or sustained,S1 and S2 within normal limits, no S3, no S4, no clicks, no rubs, no murmurs ABD:  Flat, positive bowel sounds normal in frequency in pitch, no bruits, no rebound, no guarding, no midline pulsatile mass, no hepatomegaly, no splenomegaly EXT:  2 plus pulses throughout, no edema, no cyanosis no clubbing SKIN:  No rashes no nodules NEURO:  Cranial nerves II through XII grossly intact, motor grossly intact throughout PSYCH:  Cognitively intact, oriented to person place and time  EKG: Atrial fibrillation, no acute ST T wave changes.  (9/1)           NSR, rate 58, No acute ST T wave changes (9/6)  ASSESSMENT AND PLAN

## 2011-09-06 ENCOUNTER — Telehealth: Payer: Self-pay | Admitting: Cardiology

## 2011-09-06 DIAGNOSIS — I4891 Unspecified atrial fibrillation: Secondary | ICD-10-CM

## 2011-09-06 NOTE — Telephone Encounter (Signed)
Pt called back and is to call office back thursday to discuss rash and to see if she should start coumadin.Pt verbalized understanding. Alfonso Ramus RN

## 2011-09-06 NOTE — Telephone Encounter (Signed)
Please have the patient stop the Pradaxa.   She should call me at the end of the week to discuss the rash.  She will likely need to be on Coumadin which I will discuss at that time.

## 2011-09-06 NOTE — Telephone Encounter (Signed)
Per pt call pt c/o itching on back, stomach, and neck and rash on stomach. Pt believes the itching and rash could be a result of a allergic reaction to pradaxia. Please return pt call to discuss further.

## 2011-09-06 NOTE — Telephone Encounter (Addendum)
Pt started new med on 09/03/11 which is Pradaxa. Today pt woke with rash and itching on neck abdomen and back. No other new meds started. Dr Antoine Poche please   Advise. Alfonso Ramus RN

## 2011-09-09 MED ORDER — WARFARIN SODIUM 5 MG PO TABS: 5.0000 mg | ORAL_TABLET | Freq: Every day | ORAL | Status: DC

## 2011-09-09 NOTE — Telephone Encounter (Signed)
Yes start Coumadin per our coumadin clinic.  Initial dose 5 mg.  Check level Monday.

## 2011-09-09 NOTE — Telephone Encounter (Signed)
Patient was told to call back regarding allergic reaction to medication.

## 2011-09-09 NOTE — Telephone Encounter (Signed)
Pt states she it still itching but the rash is not as red as it was.  She reports that she has not started using anything else new or different.  Would like to know if she should start Coumadin or not.  Aware I will discuss we Dr Antoine Poche and let her know.

## 2011-09-09 NOTE — Telephone Encounter (Signed)
Pt aware of orders to start coumadin at 5 mg a day.  appt scheduled for the CC on Monday as ordered.  RX sent electronically.  Instructed pt to take coumadin in the evenings, maintain a consistent amount of green leafy vegetables and watch for any signs of bleeding.  She states understanding.

## 2011-09-10 ENCOUNTER — Encounter: Payer: Self-pay | Admitting: Family Medicine

## 2011-09-10 ENCOUNTER — Ambulatory Visit (INDEPENDENT_AMBULATORY_CARE_PROVIDER_SITE_OTHER): Payer: 59 | Admitting: Family Medicine

## 2011-09-10 VITALS — BP 108/72 | HR 61 | Temp 97.2°F | Ht 63.0 in | Wt 192.2 lb

## 2011-09-10 DIAGNOSIS — R21 Rash and other nonspecific skin eruption: Secondary | ICD-10-CM

## 2011-09-10 MED ORDER — CALAMINE EX LOTN: TOPICAL_LOTION | CUTANEOUS | Status: DC | PRN

## 2011-09-10 MED ORDER — FEXOFENADINE HCL 60 MG PO TABS: 60.0000 mg | ORAL_TABLET | Freq: Two times a day (BID) | ORAL | Status: DC

## 2011-09-10 MED ORDER — RANITIDINE HCL 150 MG PO TABS: 150.0000 mg | ORAL_TABLET | Freq: Two times a day (BID) | ORAL | Status: AC

## 2011-09-10 NOTE — Patient Instructions (Signed)
Please take Allegra and Zantac as directed until rash resolves. It could take 3-4 weeks for rash to completely resolve. You may take Benadryl at bedtime if needed. If rash does not improve in 4 weeks, please return to clinic. It was nice to see you today.

## 2011-09-13 ENCOUNTER — Ambulatory Visit (INDEPENDENT_AMBULATORY_CARE_PROVIDER_SITE_OTHER): Payer: 59 | Admitting: *Deleted

## 2011-09-13 ENCOUNTER — Other Ambulatory Visit (INDEPENDENT_AMBULATORY_CARE_PROVIDER_SITE_OTHER): Payer: 59 | Admitting: *Deleted

## 2011-09-13 DIAGNOSIS — Z7901 Long term (current) use of anticoagulants: Secondary | ICD-10-CM

## 2011-09-13 DIAGNOSIS — I4891 Unspecified atrial fibrillation: Secondary | ICD-10-CM

## 2011-09-13 LAB — POCT INR: INR: 1.1

## 2011-09-14 LAB — CBC WITH DIFFERENTIAL/PLATELET
Basophils Absolute: 0.1 10*3/uL (ref 0.0–0.1)
Basophils Relative: 0.8 % (ref 0.0–3.0)
Eosinophils Absolute: 0.2 10*3/uL (ref 0.0–0.7)
Eosinophils Relative: 3 % (ref 0.0–5.0)
HCT: 37.1 % (ref 36.0–46.0)
Hemoglobin: 12.4 g/dL (ref 12.0–15.0)
Lymphocytes Relative: 28.2 % (ref 12.0–46.0)
Lymphs Abs: 2.2 10*3/uL (ref 0.7–4.0)
MCHC: 33.3 g/dL (ref 30.0–36.0)
MCV: 87.2 fl (ref 78.0–100.0)
Monocytes Absolute: 0.8 10*3/uL (ref 0.1–1.0)
Monocytes Relative: 10.2 % (ref 3.0–12.0)
Neutro Abs: 4.5 10*3/uL (ref 1.4–7.7)
Neutrophils Relative %: 57.8 % (ref 43.0–77.0)
Platelets: 211 10*3/uL (ref 150.0–400.0)
RBC: 4.25 Mil/uL (ref 3.87–5.11)
RDW: 14 % (ref 11.5–14.6)
WBC: 7.8 10*3/uL (ref 4.5–10.5)

## 2011-09-15 ENCOUNTER — Other Ambulatory Visit: Payer: Self-pay | Admitting: *Deleted

## 2011-09-15 DIAGNOSIS — I10 Essential (primary) hypertension: Secondary | ICD-10-CM

## 2011-09-15 DIAGNOSIS — R21 Rash and other nonspecific skin eruption: Secondary | ICD-10-CM | POA: Insufficient documentation

## 2011-09-15 MED ORDER — DILTIAZEM HCL 120 MG PO CP24: 120.0000 mg | ORAL_CAPSULE | Freq: Every day | ORAL | Status: DC

## 2011-09-15 NOTE — Progress Notes (Signed)
  Subjective:    Patient ID: [name], female    DOB: [birth year], 60 y.o.   MRN: [id number]  HPI  Patient presents to clinic with rash that started about one week ago.  Patient was seen by cardiologist for atrial fibrillation and started Pradaxa on 09/04/11.  One week later, she developed rash.  Started on back and has spread to buttocks, bilateral arms and legs.  Face, palms, soles are spared.  Patient called cardiologist who told patient to stop Pradaxa and start coumadin instead.  Patient has been taking Benadryl 25 mg every 4-6 hours for itching, but this makes her sleepy.  Wants to try medication that will not cause drowsiness.  Rash is itching, not painful.  Denies any fever, chills, night sweats, difficulty swallowing, difficulty breathing.   Denies any exposure to ticks/insects, shrubs/poisonous plants.  Review of Systems  Per HPI    Objective:   Physical Exam  General: in no distress Neck: no adenopathy or cervical tenderness HEENT: oropharynx clear and moist; no lesions or erythema or exudate Heart: RRR Lungs: CTAB Skin: maculopapular, pruritic rash located on back, buttocks, bilateral upper and lower extremities; face, palms, and soles are spared.  Rash is mildly erythematous; no excoriations, active bleeding, blisters, or drainage      Assessment & Plan:

## 2011-09-15 NOTE — Assessment & Plan Note (Addendum)
Likely secondary to Pradaxa (per cardiologist).  Some evidence shows that <1% patients on pradaxa will develop rash. Per cardiologist, Pradaxa discontinued. Will treat symptoms with Zantac and Allegra.  Benadryl at bedtime if needed. Will give Rx for Calamine lotion to be applied BID. Discussed with patient - drug reactions may take several weeks to resolve.   If rash persists after 4 weeks, patient to return to clinic.

## 2011-09-16 ENCOUNTER — Other Ambulatory Visit: Payer: 59 | Admitting: *Deleted

## 2011-09-20 ENCOUNTER — Ambulatory Visit (INDEPENDENT_AMBULATORY_CARE_PROVIDER_SITE_OTHER): Payer: 59 | Admitting: *Deleted

## 2011-09-20 ENCOUNTER — Encounter: Payer: 59 | Admitting: *Deleted

## 2011-09-20 DIAGNOSIS — I4891 Unspecified atrial fibrillation: Secondary | ICD-10-CM

## 2011-09-20 DIAGNOSIS — Z7901 Long term (current) use of anticoagulants: Secondary | ICD-10-CM

## 2011-09-20 LAB — POCT INR: INR: 1.2

## 2011-09-27 ENCOUNTER — Ambulatory Visit (INDEPENDENT_AMBULATORY_CARE_PROVIDER_SITE_OTHER): Payer: 59 | Admitting: *Deleted

## 2011-09-27 DIAGNOSIS — I4891 Unspecified atrial fibrillation: Secondary | ICD-10-CM

## 2011-09-27 DIAGNOSIS — Z7901 Long term (current) use of anticoagulants: Secondary | ICD-10-CM

## 2011-09-27 LAB — POCT INR: INR: 1.8

## 2011-10-04 ENCOUNTER — Telehealth: Payer: Self-pay | Admitting: Cardiology

## 2011-10-04 ENCOUNTER — Ambulatory Visit (INDEPENDENT_AMBULATORY_CARE_PROVIDER_SITE_OTHER): Payer: 59 | Admitting: *Deleted

## 2011-10-04 DIAGNOSIS — I4891 Unspecified atrial fibrillation: Secondary | ICD-10-CM

## 2011-10-04 DIAGNOSIS — Z7901 Long term (current) use of anticoagulants: Secondary | ICD-10-CM

## 2011-10-04 LAB — POCT INR: INR: 2.7

## 2011-10-04 NOTE — Telephone Encounter (Signed)
Called to follow up with pt who states she is feeling much better now and her HR has returned to normal.  She is aware there are to be no changes in her treatment at this time.

## 2011-10-04 NOTE — Telephone Encounter (Signed)
Will forward to MD for recommendations 

## 2011-10-04 NOTE — Telephone Encounter (Signed)
Pt called and stated she has been in a fib for past hour.  She needs to know if she should go to ED or come in.  Please  Call her as soon as possible.

## 2011-10-04 NOTE — Telephone Encounter (Signed)
We will call the patient and check on her later today.  However, her heart rate is not elevated. Probably no change in therapy indicated.

## 2011-10-04 NOTE — Telephone Encounter (Signed)
Spoke with pt, this morning after walking quickly into work the pt reports her heart started racing. She rested a little and it did not change. She has had her diltiazem and atenolol this am. She feels fine other than a little SOB. She reports her heart rate at 78-80. Reassurance given to pt. Will forward to pam to discuss with dr hochrein Deliah Goody

## 2011-10-25 ENCOUNTER — Ambulatory Visit (INDEPENDENT_AMBULATORY_CARE_PROVIDER_SITE_OTHER): Payer: 59 | Admitting: *Deleted

## 2011-10-25 DIAGNOSIS — Z7901 Long term (current) use of anticoagulants: Secondary | ICD-10-CM

## 2011-10-25 DIAGNOSIS — I4891 Unspecified atrial fibrillation: Secondary | ICD-10-CM

## 2011-10-25 LAB — POCT INR: INR: 2

## 2011-11-04 ENCOUNTER — Ambulatory Visit: Payer: 59 | Admitting: Cardiology

## 2011-11-10 ENCOUNTER — Encounter: Payer: Self-pay | Admitting: Cardiology

## 2011-11-10 ENCOUNTER — Ambulatory Visit (INDEPENDENT_AMBULATORY_CARE_PROVIDER_SITE_OTHER): Payer: 59 | Admitting: Cardiology

## 2011-11-10 DIAGNOSIS — I4891 Unspecified atrial fibrillation: Secondary | ICD-10-CM

## 2011-11-10 DIAGNOSIS — I2510 Atherosclerotic heart disease of native coronary artery without angina pectoris: Secondary | ICD-10-CM

## 2011-11-10 NOTE — Assessment & Plan Note (Addendum)
She continues to have paroxysms of this.  We have discussed this at length.  She wants to be on warfarin with her CHADS of 1 as per recent Chest suggestions.  She understands the risks and benefits of this and agrees to proceed.

## 2011-11-10 NOTE — Patient Instructions (Signed)
Follow up in 1 year with Dr Hochrein.  You will receive a letter in the mail 2 months before you are due.  Please call us when you receive this letter to schedule your follow up appointment.  Continue current medications as listed  

## 2011-11-10 NOTE — Progress Notes (Signed)
HPI The patient presents for evaluation of atrial fibrillation.  She was seen in the hospital for this.  She has had this paroxysmally in the past.   She had an allergy to Pradaxa.   She's currently on warfarin. She has had one paroxysms of A. fib at least  seen her. It lasted about 3 hours. It was not as strong and hard as the episode that led to her hospitalization. She didn't have presyncope or syncope. It came and went spontaneously. She has had no recurrent palpitations. She's had no new shortness of breath, PND or orthopnea. She denies any chest pressure, neck or arm discomfort. She's had no weight gain or edema.  Allergies  Allergen Reactions  . Penicillins Itching  . Percocet (Oxycodone-Acetaminophen) Itching    No past medical history on file.  (See problem list.  No change 11/10/2011)  Past Surgical History  Procedure Date  . Joint replacement   . Tubal ligation     ROS:  As stated in the HPI and negative for all other systems.  PHYSICAL EXAM BP 140/69  Pulse 63  Resp 16  Ht 5\' 3"  (1.6 m)  Wt 87.998 kg (194 lb)  BMI 34.37 kg/m2 GENERAL:  Well appearing HEENT:  Pupils equal round and reactive, fundi not visualized, oral mucosa unremarkable NECK:  No jugular venous distention, waveform within normal limits, carotid upstroke brisk and symmetric, no bruits, no thyromegaly LYMPHATICS:  No cervical, inguinal adenopathy LUNGS:  Clear to auscultation bilaterally BACK:  No CVA tenderness CHEST:  Unremarkable HEART:  PMI not displaced or sustained,S1 and S2 within normal limits, no S3, no S4, no clicks, no rubs, no murmurs ABD:  Flat, positive bowel sounds normal in frequency in pitch, no bruits, no rebound, no guarding, no midline pulsatile mass, no hepatomegaly, no splenomegaly EXT:  2 plus pulses throughout, no edema, no cyanosis no clubbing SKIN:  No rashes no nodules NEURO:  Cranial nerves II through XII grossly intact, motor grossly intact throughout PSYCH:  Cognitively  intact, oriented to person place and time  EKG:   Sinus rhythm, rate 57, axis within normal limits, intervals within normal limits, no acute ST-T wave changes. 11/10/2011   ASSESSMENT AND PLAN

## 2011-11-22 ENCOUNTER — Ambulatory Visit (INDEPENDENT_AMBULATORY_CARE_PROVIDER_SITE_OTHER): Payer: 59 | Admitting: *Deleted

## 2011-11-22 DIAGNOSIS — I4891 Unspecified atrial fibrillation: Secondary | ICD-10-CM

## 2011-11-22 DIAGNOSIS — Z7901 Long term (current) use of anticoagulants: Secondary | ICD-10-CM

## 2011-11-22 LAB — POCT INR: INR: 1.8

## 2011-12-13 ENCOUNTER — Ambulatory Visit (INDEPENDENT_AMBULATORY_CARE_PROVIDER_SITE_OTHER): Payer: 59 | Admitting: *Deleted

## 2011-12-13 DIAGNOSIS — I4891 Unspecified atrial fibrillation: Secondary | ICD-10-CM

## 2011-12-13 DIAGNOSIS — Z7901 Long term (current) use of anticoagulants: Secondary | ICD-10-CM

## 2011-12-13 LAB — POCT INR: INR: 2.1

## 2011-12-18 ENCOUNTER — Encounter (HOSPITAL_COMMUNITY): Payer: Self-pay

## 2011-12-18 ENCOUNTER — Emergency Department (HOSPITAL_COMMUNITY)
Admission: EM | Admit: 2011-12-18 | Discharge: 2011-12-18 | Disposition: A | Discharge status: Home / Self Care | Payer: 59 | Source: Home / Self Care | Attending: Family Medicine | Admitting: Family Medicine

## 2011-12-18 DIAGNOSIS — N390 Urinary tract infection, site not specified: Secondary | ICD-10-CM

## 2011-12-18 HISTORY — DX: Essential (primary) hypertension: I10

## 2011-12-18 LAB — POCT URINALYSIS DIP (DEVICE)
Glucose, UA: NEGATIVE mg/dL
Nitrite: NEGATIVE
Protein, ur: 100 mg/dL — AB
Specific Gravity, Urine: >=1.03 (ref 1.005–1.030)
Urobilinogen, UA: 1 mg/dL (ref 0.0–1.0)
pH: 5 (ref 5.0–8.0)

## 2011-12-18 MED ORDER — FLUCONAZOLE 150 MG PO TABS: 150.0000 mg | ORAL_TABLET | Freq: Once | ORAL | Status: AC

## 2011-12-18 MED ORDER — SULFAMETHOXAZOLE-TRIMETHOPRIM 800-160 MG PO TABS: 1.0000 | ORAL_TABLET | Freq: Two times a day (BID) | ORAL | Status: AC

## 2011-12-18 MED ORDER — SULFAMETHOXAZOLE-TRIMETHOPRIM 800-160 MG PO TABS: 1.0000 | ORAL_TABLET | Freq: Two times a day (BID) | ORAL | Status: DC

## 2011-12-18 NOTE — ED Provider Notes (Signed)
History     CSN: [id number]  Arrival date & time 12/18/11  1226   First MD Initiated Contact with Patient 12/18/11 1234      Chief Complaint  Patient presents with  . Urinary Tract Infection    (Consider location/radiation/quality/duration/timing/severity/associated sxs/prior treatment) HPI Comments: [name] presents for urinary frequency, hematuria, blood when she wipes, and terminal dysuria over the last day. She denies urgency and states that she urinates a normal amount. She denies any fever, abdominal pain, nausea, or vomiting.   Patient is a 60 y.o. female presenting with frequency. The history is provided by the patient.  Urinary Frequency This is a new problem. The current episode started yesterday. The problem has not changed since onset.The symptoms are aggravated by nothing. The symptoms are relieved by nothing.    Past Medical History  Diagnosis Date  . Hypertension   . Atrial fibrillation     Past Surgical History  Procedure Date  . Joint replacement   . Tubal ligation     Family History  Problem Relation Age of Onset  . Diabetes Mother   . Heart disease Mother   . Heart disease Father   . Cancer Brother     intestinal  . Cancer Brother     throat ca  . Cancer Brother     bladder    History  Substance Use Topics  . Smoking status: Former Smoker -- 1.5 packs/day for 15 years  . Smokeless tobacco: Not on file  . Alcohol Use: 1.8 oz/week    3 Cans of beer per week    OB History    Grav Para Term Preterm Abortions TAB SAB Ect Mult Living                  Review of Systems  Constitutional: Negative.   HENT: Negative.   Eyes: Negative.   Respiratory: Negative.   Cardiovascular: Negative.   Gastrointestinal: Negative.   Genitourinary: Positive for dysuria, frequency and hematuria. Negative for urgency and difficulty urinating.  Musculoskeletal: Negative.   Skin: Negative.   Neurological: Negative.     Allergies  Penicillins and  Percocet  Home Medications   Current Outpatient Rx  Name Route Sig Dispense Refill  . ATENOLOL 50 MG PO TABS Oral Take 1 tablet (50 mg total) by mouth daily. 30 tablet 6  . CALAMINE EX LOTN Topical Apply topically as needed. 120 mL 0  . DILTIAZEM HCL 120 MG PO CP24 Oral Take 1 capsule (120 mg total) by mouth daily. 30 capsule 6  . FEXOFENADINE HCL 60 MG PO TABS Oral Take 1 tablet (60 mg total) by mouth 2 (two) times daily. 60 tablet 0  . FLUCONAZOLE 150 MG PO TABS Oral Take 1 tablet (150 mg total) by mouth once. Take one pill once. May repeat if symptoms persist after 3rd day. 2 tablet 0  . LEVOTHYROXINE SODIUM 125 MCG PO CAPS Oral Take 1 capsule (125 mcg total) by mouth daily. 30 capsule 6  . LORATADINE 10 MG PO TABS Oral Take 10 mg by mouth daily.      Marland Kitchen ROSUVASTATIN CALCIUM 40 MG PO TABS Oral Take 1 tablet (40 mg total) by mouth daily. 30 tablet 6  . SULFAMETHOXAZOLE-TRIMETHOPRIM 800-160 MG PO TABS Oral Take 1 tablet by mouth 2 (two) times daily. 14 tablet 0  . TRIAMCINOLONE ACETONIDE 0.1 % EX OINT  Apply to affected area once per day for no longer than 2 weeks. 90 g 0  . WARFARIN  SODIUM 5 MG PO TABS Oral Take 1 tablet (5 mg total) by mouth daily. 30 tablet 3    BP 148/68  Pulse 57  Temp(Src) 97.9 F (36.6 C) (Oral)  Resp 19  SpO2 93%  Physical Exam  Nursing note and vitals reviewed. Constitutional: She is oriented to person, place, and time. She appears well-developed and well-nourished.  HENT:  Head: Normocephalic and atraumatic.  Eyes: EOM are normal.  Neck: Normal range of motion.  Pulmonary/Chest: Effort normal.  Abdominal: Soft. Bowel sounds are normal. There is no tenderness.  Musculoskeletal: Normal range of motion.  Neurological: She is alert and oriented to person, place, and time.  Skin: Skin is warm and dry.  Psychiatric: Her behavior is normal.    ED Course  Procedures (including critical care time)  Labs Reviewed  POCT URINALYSIS DIP (DEVICE) - Abnormal;  Notable for the following:    Bilirubin Urine SMALL (*)    Ketones, ur TRACE (*)    Hgb urine dipstick LARGE (*)    Protein, ur 100 (*)    Leukocytes, UA SMALL (*) Biochemical Testing Only. Please order routine urinalysis from main lab if confirmatory testing is needed.   All other components within normal limits  POCT URINALYSIS DIPSTICK   No results found.   1. UTI (lower urinary tract infection)       MDM  Will treat with TMP-SMX; close follow up with PCP if no resolution of symptoms, or worsening        Richardo Priest, MD 12/18/11 1454

## 2011-12-18 NOTE — ED Notes (Signed)
Pt has urinary frequency and burning for one day

## 2011-12-22 ENCOUNTER — Other Ambulatory Visit: Payer: Self-pay | Admitting: Cardiology

## 2012-01-10 ENCOUNTER — Ambulatory Visit (INDEPENDENT_AMBULATORY_CARE_PROVIDER_SITE_OTHER): Payer: 59 | Admitting: *Deleted

## 2012-01-10 DIAGNOSIS — Z7901 Long term (current) use of anticoagulants: Secondary | ICD-10-CM

## 2012-01-10 DIAGNOSIS — I4891 Unspecified atrial fibrillation: Secondary | ICD-10-CM

## 2012-01-10 LAB — POCT INR: INR: 1.8

## 2012-02-07 ENCOUNTER — Encounter: Payer: 59 | Admitting: *Deleted

## 2012-02-09 ENCOUNTER — Ambulatory Visit (INDEPENDENT_AMBULATORY_CARE_PROVIDER_SITE_OTHER): Payer: 59 | Admitting: Pharmacist

## 2012-02-09 DIAGNOSIS — I4891 Unspecified atrial fibrillation: Secondary | ICD-10-CM

## 2012-02-09 DIAGNOSIS — Z7901 Long term (current) use of anticoagulants: Secondary | ICD-10-CM

## 2012-02-09 LAB — POCT INR: INR: 1.9

## 2012-02-21 ENCOUNTER — Encounter: Payer: Self-pay | Admitting: Family Medicine

## 2012-02-21 ENCOUNTER — Ambulatory Visit (INDEPENDENT_AMBULATORY_CARE_PROVIDER_SITE_OTHER): Payer: 59 | Admitting: Family Medicine

## 2012-02-21 VITALS — BP 151/72 | HR 56 | Temp 97.9°F | Ht 63.0 in | Wt 196.7 lb

## 2012-02-21 DIAGNOSIS — G5600 Carpal tunnel syndrome, unspecified upper limb: Secondary | ICD-10-CM

## 2012-02-21 DIAGNOSIS — M25531 Pain in right wrist: Secondary | ICD-10-CM | POA: Insufficient documentation

## 2012-02-21 DIAGNOSIS — H6120 Impacted cerumen, unspecified ear: Secondary | ICD-10-CM | POA: Insufficient documentation

## 2012-02-21 DIAGNOSIS — M25539 Pain in unspecified wrist: Secondary | ICD-10-CM

## 2012-02-21 DIAGNOSIS — H918X9 Other specified hearing loss, unspecified ear: Secondary | ICD-10-CM

## 2012-02-21 DIAGNOSIS — H6122 Impacted cerumen, left ear: Secondary | ICD-10-CM

## 2012-02-21 MED ORDER — ACETAMINOPHEN ER 650 MG PO TBCR: 650.0000 mg | EXTENDED_RELEASE_TABLET | Freq: Three times a day (TID) | ORAL | Status: DC | PRN

## 2012-02-21 NOTE — Assessment & Plan Note (Signed)
Patient says she was diagnosed with carpal tunnel syndrome in the pastWe'll give the patient. Give patient a wrist brace for carpal tunnel and recommended Tylenol 650 mg when necessary.  Return to clinic if symptoms worsen.

## 2012-02-21 NOTE — Patient Instructions (Addendum)
It was nice to see you again. We can give you a wrist brace - wear it everyday for one week. Take Tylenol 650 mg one tablet every 8 hrs as needed for pain.  If symptoms worsen or hand pain is worse, please call MD or return to clinic. Have a great week, Eara!

## 2012-02-21 NOTE — Assessment & Plan Note (Signed)
Left ear fullness and decreased hearing secondary to cerumen impaction. Ears are flushed in clinic today.

## 2012-02-21 NOTE — Progress Notes (Signed)
  Subjective:    Patient ID: [name], female    DOB: 03/04/1951, 61 y.o.   MRN: [id number]  HPI  Patient presents to same-day clinic with right wrist pain and ear fullness on left.  Wrist pain: Pain started several months ago after she fell on an outstretched right hand. Patient describes the pain as throbbing at times, but is always dull and aching. Patient also complains of numbness of her right thumb and right middle finger that has been going on for a while. She was told by a previous doctor that she does have carpal tunnel syndrome. Patient has not tried any over-the-counter medications because she is on warfarin and did not know what to take. Patient currently works at a [employer] but she has not had any difficulty at work.  Ear fullness: Patient complains of ear fullness and decreased hearing in her left ear. She has a ear wax kit at home which she has tried with no symptom relief. Denies any ear ache. Denies any fever, chills, night sweats, vomiting, headache, runny nose, cough.  Review of Systems  Per history of present illness    Objective:   Physical Exam  Constitutional: No distress.  HENT:  Head: Normocephalic and atraumatic.       Left ear-large block of wax, unable to visualize tympanic membrane. Right ear normal.  Eyes: Pupils are equal, round, and reactive to light.  Neck: Normal range of motion. Neck supple.  Musculoskeletal:       Decreased sensation of her right fingers compared to left. Normal distal pulses bilaterally. Able to flex and extend wrist without difficulty.  positive Phalen's test.  Lymphadenopathy:    She has no cervical adenopathy.          Assessment & Plan:

## 2012-03-13 ENCOUNTER — Ambulatory Visit (INDEPENDENT_AMBULATORY_CARE_PROVIDER_SITE_OTHER): Payer: 59 | Admitting: *Deleted

## 2012-03-13 DIAGNOSIS — I4891 Unspecified atrial fibrillation: Secondary | ICD-10-CM

## 2012-03-13 DIAGNOSIS — Z7901 Long term (current) use of anticoagulants: Secondary | ICD-10-CM

## 2012-03-13 LAB — POCT INR: INR: 1.2

## 2012-03-22 ENCOUNTER — Ambulatory Visit (INDEPENDENT_AMBULATORY_CARE_PROVIDER_SITE_OTHER): Payer: 59 | Admitting: *Deleted

## 2012-03-22 DIAGNOSIS — I4891 Unspecified atrial fibrillation: Secondary | ICD-10-CM

## 2012-03-22 DIAGNOSIS — Z7901 Long term (current) use of anticoagulants: Secondary | ICD-10-CM

## 2012-03-22 LAB — POCT INR: INR: 1.4

## 2012-04-05 ENCOUNTER — Ambulatory Visit (INDEPENDENT_AMBULATORY_CARE_PROVIDER_SITE_OTHER): Payer: 59 | Admitting: *Deleted

## 2012-04-05 DIAGNOSIS — I4891 Unspecified atrial fibrillation: Secondary | ICD-10-CM

## 2012-04-05 DIAGNOSIS — Z7901 Long term (current) use of anticoagulants: Secondary | ICD-10-CM

## 2012-04-05 LAB — POCT INR: INR: 2.7

## 2012-04-19 ENCOUNTER — Encounter: Payer: Self-pay | Admitting: Family Medicine

## 2012-04-19 ENCOUNTER — Ambulatory Visit (INDEPENDENT_AMBULATORY_CARE_PROVIDER_SITE_OTHER): Payer: 59 | Admitting: Family Medicine

## 2012-04-19 VITALS — BP 120/68 | HR 60 | Temp 98.1°F | Ht 63.6 in | Wt 197.0 lb

## 2012-04-19 DIAGNOSIS — F329 Major depressive disorder, single episode, unspecified: Secondary | ICD-10-CM

## 2012-04-19 DIAGNOSIS — I252 Old myocardial infarction: Secondary | ICD-10-CM

## 2012-04-19 MED ORDER — FLUOXETINE HCL 40 MG PO CAPS: 40.0000 mg | ORAL_CAPSULE | Freq: Every day | ORAL | Status: DC

## 2012-04-19 MED ORDER — ROSUVASTATIN CALCIUM 40 MG PO TABS: 40.0000 mg | ORAL_TABLET | Freq: Every day | ORAL | Status: DC

## 2012-04-19 NOTE — Assessment & Plan Note (Signed)
Patient's symptoms have been present for over 2 months, depression screening score was 11. Patient agreeable to starting antidepressant therapy. Will start Prozac 40 mg daily. Handout given with instructions. Advised patient to call [facility] [place] if she is interested in counseling with emphasis on crisis line. Patient to return to PCP in 4 weeks for followup. Red flags reviewed. May consider referral to Dr. Pascal Lux if patient's symptoms do not improve with medical treatment only.

## 2012-04-19 NOTE — Patient Instructions (Signed)
It was good to see you today [name]. Please pick up new medications at your pharmacy and take as instructed. Please refer to handout for family services of [place] for you and her grandchildren. If you need to speak to somebody after hours, you may call the crisis line at anytime. Please return to clinic with Dr. Domenick Bookbinder in 4 weeks. If you develop any side effects that are concerning to you please call M.D.  FLUOXETINE belongs to a class of drugs known as selective serotonin reuptake inhibitors (SSRIs). It helps to treat mood problems such as depression, obsessive compulsive disorder, and panic attacks. It can also treat certain eating disorders. This medicine may be used for other purposes; ask your health care provider or pharmacist if you have questions. What should I tell my health care provider before I take this medicine? They need to know if you have any of these conditions: -bipolar disorder or mania -diabetes -glaucoma -liver disease -psychosis -seizures -suicidal thoughts or history of attempted suicide -an unusual or allergic reaction to fluoxetine, other medicines, foods, dyes, or preservatives -pregnant or trying to get pregnant -breast-feeding  How should I use this medicine? Take this medicine by mouth with a glass of water. Follow the directions on the prescription label. You can take this medicine with or without food. Take your medicine at regular intervals. Do not take it more often than directed. Do not stop taking except on your doctor's advice. A special MedGuide will be given to you by the pharmacist with each prescription and refill. Be sure to read this information carefully each time. Talk to your pediatrician regarding the use of this medicine in children. While this drug may be prescribed for children as young as 7 years for selected conditions, precautions do apply.  Overdosage: If you think you have taken too much of this medicine contact a poison control  center or emergency room at once. NOTE: This medicine is only for you. Do not share this medicine with others.  What if I miss a dose? If you miss a dose, skip the missed dose and go back to your regular dosing schedule. Do not take double or extra doses.  What may interact with this medicine? Do not take fluoxetine with any of the following medications: -other medicines containing fluoxetine, like Sarafem or Symbyax -certain diet drugs like dexfenfluramine, fenfluramine, phentermine -cisapride -linezolid -medicines called MAO Inhibitors like Azilect, Carbex, Eldepryl, Marplan, Nardil, and Parnate -methylene blue -pimozide -procarbazine -thioridazine -tryptophan  Fluoxetine may also interact with the following medications: -alcohol -any other medicines for depression, anxiety, or psychotic disturbances -aspirin and aspirin-like medicines -carbamazepine -cyproheptadine -dextromethorphan -flecainide -lithium -medicines for diabetes -medicines for migraine headache, like sumatriptan -medicines for sleep -medicines that treat or prevent blood clots like warfarin, enoxaparin, and dalteparin -metoprolol -NSAIDs, medicines for pain and inflammation, like ibuprofen or naproxen -phenytoin -propafenone -propranolol -St. John's wort -vinblastine  This list may not describe all possible interactions. Give your health care provider a list of all the medicines, herbs, non-prescription drugs, or dietary supplements you use. Also tell them if you smoke, drink alcohol, or use illegal drugs. Some items may interact with your medicine. What should I watch for while using this medicine? Visit your doctor or health care professional for regular checks on your progress. Continue to take your medicine even if you do not immediately feel better. It can take several weeks before you notice the full effect of this medicine. Patients and their families should watch out for worsening  depression or  thoughts of suicide. Also watch out for any sudden or severe changes in feelings such as feeling anxious, agitated, panicky, irritable, hostile, aggressive, impulsive, severely restless, overly excited and hyperactive, or not being able to sleep. If this happens, especially at the beginning of treatment or after a change in dose, call your doctor. You may get drowsy or dizzy. Do not drive, use machinery, or do anything that needs mental alertness until you know how this medicine affects you. Do not stand or sit up quickly, especially if you are an older patient. This reduces the risk of dizzy or fainting spells. Alcohol can make you more drowsy and dizzy. Avoid alcoholic drinks. Your mouth may get dry. Chewing sugarless gum or sucking hard candy, and drinking plenty of water may help. Contact your doctor if the problem does not go away or is severe. If you have diabetes, this medicine may affect blood sugar levels. Check your blood sugar. Talk to your doctor or health care professional if you notice changes. If you have been taking this medicine regularly for some time, do not suddenly stop taking it. You must gradually reduce the dose or you may get side effects or have a worsening of your condition. Ask your doctor or health care professional for advice. Do not treat yourself for coughs, colds or allergies without asking your doctor or health care professional for advice. Some ingredients can increase possible side effects. What side effects may I notice from receiving this medicine? Side effects that you should report to your doctor or health care professional as soon as possible: -allergic reactions like skin rash, itching or hives, swelling of the face, lips, or tongue -breathing problems -confusion -fast or irregular heart rate, palpitations -flu-like fever, chills, cough, muscle or joint aches and pains -seizures -suicidal thoughts or other mood changes -tremors -trouble sleeping -unusual  bleeding or bruising -unusually tired or weak -vomiting Side effects that usually do not require medical attention (report to your doctor or health care professional if they continue or are bothersome): -blurred vision -change in sex drive or performance -diarrhea -dry mouth -flushing -headache -increased or decreased appetite -nausea -sweating This list may not describe all possible side effects. Call your doctor for medical advice about side effects. You may report side effects to FDA at 1-800-FDA-1088. Where should I keep my medicine? Keep out of the reach of children. Store at room temperature between 15 and 30 degrees C (59 and 86 degrees F). Throw away any unused medicine after the expiration date.

## 2012-04-19 NOTE — Progress Notes (Signed)
  Subjective:    Patient ID: [name], female    DOB: [birth year], 61 y.o.   MRN: [id number]  HPI  Patient presents to clinic complaining of fatigue, "not feeling well" in general. This has been going on for 2 months. Patient says that family life has been very stressful lately. Her son was recently arrested and incarcerated. She lives with her husband and 2 granddaughters, 15 years old and 69 year old. Her oldest grandchild has bipolar disorder and patient believes she is doing drugs. Her 66 year old grandchild is currently pregnant. Patient would like both grandchildren to move out of the house, but her husband will not allow it. This has caused much stress on patient and now she constantly feels tired and depressed. Patient states that she has been diagnosed with depression in the past, but was never started on medication. Now she is agreeable to starting antidepressant therapy.  PHQ: 11. Denies any suicidal or homicidal thoughts. She admits to decreased sleep, decreased appetite, feelings of hopelessness, decreased energy and concentration. Patient feels safe at home.    Review of Systems  Per HPI    Objective:   Physical Exam  Constitutional: No distress.       Tearful  Psychiatric: Her speech is normal and behavior is normal. Her mood appears not anxious. She exhibits a depressed mood. She expresses no homicidal and no suicidal ideation.          Assessment & Plan:

## 2012-04-20 ENCOUNTER — Telehealth: Payer: Self-pay | Admitting: Family Medicine

## 2012-04-20 DIAGNOSIS — E785 Hyperlipidemia, unspecified: Secondary | ICD-10-CM

## 2012-04-20 NOTE — Telephone Encounter (Signed)
Called Pharmacy and was told that  Insurance will pay after May 19. Not sure why . Called and left message for patient to call back.Marland Kitchen

## 2012-04-20 NOTE — Telephone Encounter (Signed)
Went to pick up her crestor and pharm told her that her insurance won't pay for this until June.  Not sure what to do - is there any other medicine that she can take? [facility]- [address]

## 2012-04-21 NOTE — Telephone Encounter (Signed)
Spoke with patient . States she has been off Crestor for 2 months. Hasn't recently bought any . Not sure why insurance will not pay until may 19. Suggested she call insurance to ask .  She still wants to know if there is anything less expensive that she could get now.[name] will send message to MD.

## 2012-04-24 NOTE — Telephone Encounter (Signed)
She can hold off on Crestor until May 19th.  She will need to come in for a FASTING lipid panel next month as well.  I will place a future order if I have not already done so.  Please call patient to schedule lab visit with Kendal Hymen in May.  Thanks.

## 2012-04-24 NOTE — Telephone Encounter (Signed)
Addended by: Tye Savoy, Darriel Utter on: 04/24/2012 04:48 PM   Modules accepted: Orders

## 2012-04-24 NOTE — Telephone Encounter (Signed)
Message left for patient to call back

## 2012-04-25 ENCOUNTER — Ambulatory Visit (INDEPENDENT_AMBULATORY_CARE_PROVIDER_SITE_OTHER): Payer: 59 | Admitting: Family Medicine

## 2012-04-25 ENCOUNTER — Encounter: Payer: Self-pay | Admitting: Family Medicine

## 2012-04-25 ENCOUNTER — Ambulatory Visit
Admission: RE | Admit: 2012-04-25 | Discharge: 2012-04-25 | Disposition: A | Discharge status: Home / Self Care | Payer: 59 | Source: Ambulatory Visit | Attending: Family Medicine | Admitting: Family Medicine

## 2012-04-25 VITALS — BP 133/77 | HR 61 | Temp 98.1°F | Ht 63.0 in | Wt 199.0 lb

## 2012-04-25 DIAGNOSIS — M25531 Pain in right wrist: Secondary | ICD-10-CM

## 2012-04-25 DIAGNOSIS — M25532 Pain in left wrist: Secondary | ICD-10-CM

## 2012-04-25 DIAGNOSIS — M25539 Pain in unspecified wrist: Secondary | ICD-10-CM

## 2012-04-25 NOTE — Telephone Encounter (Signed)
Patient notified of message . She has been off Crestor for 3 weeks.  Patient reports a burning sensation in hand last night and still hurting this AM. Appointment scheduled today.

## 2012-04-25 NOTE — Progress Notes (Signed)
Subjective:     Patient ID: [name], female   DOB: 09-Aug-1951, 61 y.o.   MRN: [id number]  HPI Ms. Kahler presents with a complaint of shooting right wrist pain different from her previous carpal tunnel syndrome. Two days prior to presentation she noted weakness in her right hand leading to dropping items at work.  It was non-painful at that time. The night prior to presentation she rotated her right wrist causing her to experience intense shooting pain from lateral mid-forearm to the tip of the fifth digit. This was not improved with taking Tylenol 650mg , which she is prescribed for her carpal tunnel syndrome. Movement in any direction intensely aggravates her symptoms. She denies any swelling or redness. She recalls a fall on outstretched hand while at work approximately one year ago.    Review of Systems Patient is without complaint other than those outlined in HPI.    Objective:   Physical Exam  Constitutional: She appears well-developed and well-nourished.   Neuro: Sensory is intact bilaterally throughout the upper extremity. No focal deficits  Wrist: Inspection normal with no visible erythema or swelling. ROM right wrist is painful to movement with decreased active and passive ROM in both flexion and extension due to pain. Fully intact in left wrist. Palpation is normal over metacarpals, navicular, lunate, and  Tender over right TFCC and hamate; tendons without swelling but tender along extensor carpi ulnaris on right Strength 5/5 in all directions without pain in left wrist.  Strength 3/5 with pain in all directions in left wrist Negative Finkelstein, tinel's and positive phalens.     Assessment:     Ms. Nadel is a 61 y.o. Woman presenting with new painful symptoms of the ulnar wrist. Wrist Pain  Consult Sports medicine  X-ray for arthritis, hamate, and other pathology  Increase Tylenol to 1000mg  TID  Continue wrist splint     Plan:     Pt seen and examined with MS4 with  above assessment and plan.

## 2012-04-25 NOTE — Patient Instructions (Signed)
It was good to see today Continue with the wrist splint especially at night Try scheduled Tylenol for pain We are getting x-rays of your wrist I am sending you to sports medicine for further management Call if any questions God Bless, Doree Albee MD

## 2012-04-26 ENCOUNTER — Ambulatory Visit (INDEPENDENT_AMBULATORY_CARE_PROVIDER_SITE_OTHER): Payer: 59 | Admitting: Pharmacist

## 2012-04-26 DIAGNOSIS — Z7901 Long term (current) use of anticoagulants: Secondary | ICD-10-CM

## 2012-04-26 DIAGNOSIS — I4891 Unspecified atrial fibrillation: Secondary | ICD-10-CM

## 2012-04-26 LAB — POCT INR: INR: 2.4

## 2012-04-26 NOTE — Assessment & Plan Note (Signed)
New from from prior dx of carpal with predominant ulnar wrist involvement. Pt had trauma to area (falling on outstretched hand 1 year ago) which may be nidus for this. ? TFCC vs ulnar sided flexor/extendor pathology. Will start with wrist xray. Formal referral to [facility]. Continue wrist splint as well as scheduled tylenol (avoiding NSAIDs in setting of coumadin use and HTN).

## 2012-04-28 ENCOUNTER — Ambulatory Visit (INDEPENDENT_AMBULATORY_CARE_PROVIDER_SITE_OTHER): Payer: 59 | Admitting: Family Medicine

## 2012-04-28 VITALS — BP 147/76

## 2012-04-28 DIAGNOSIS — M25539 Pain in unspecified wrist: Secondary | ICD-10-CM

## 2012-04-28 DIAGNOSIS — M25531 Pain in right wrist: Secondary | ICD-10-CM

## 2012-04-28 MED ORDER — KETOPROFEN POWD: Status: DC

## 2012-04-28 NOTE — Progress Notes (Signed)
Subjective:    Patient ID: [name], female    DOB: 1951-04-01, 61 y.o.   MRN: [id number]  HPI    [name] is a pleasant 61 years old female patient with a complaint of right wrist pain. The pain is located in the lateral aspect of her wrist, toward the volar side, worse with extreme range of motion, worse with heavy lifting. She has been having pain for the last month, she denies an injury to her wrist. He denies any numbness or tingling. She has been using a wrist brace which has helped her with the pain. She has been taking Tylenol for pain control which has helped as well. She denies using any anti-inflammatories. She had x-rays of her right wrist on 04/25/12, which show some carpal bones cyst. 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 HYPOTHYROIDISM, UNSPECIFIED  . HYPERLIPIDEMIA  . OBESITY, UNSPECIFIED  . ESSENTIAL HYPERTENSION, BENIGN  . MYOCARDIAL INFARCTION, OLD  . CAD  . Atrial fibrillation  . DEGENERATIVE JOINT DISEASE, LEFT HIP  . HIP PAIN, BILATERAL  . Colon polyps  . Physical exam, annual  . Postmenopausal bleeding  . Vulvar dermatitis  . Mixed incontinence urge and stress (female)(female)  . Impaired fasting blood sugar  . Encounter for long-term (current) use of anticoagulants  . Maculopapular rash, generalized  . Wrist pain, right  . Hearing loss secondary to cerumen impaction  . Depression, major   Current Outpatient Prescriptions on File Prior to Visit  Medication Sig Dispense Refill  . acetaminophen (TYLENOL) 650 MG CR tablet Take 1 tablet (650 mg total) by mouth every 8 (eight) hours as needed for pain.  30 tablet  3  . atenolol (TENORMIN) 50 MG tablet Take 1 tablet (50 mg total) by mouth daily.  30 tablet  6  . calamine lotion Apply topically as needed.  120 mL  0  . diltiazem (DILACOR XR) 120 MG 24 hr capsule Take 1 capsule (120 mg total) by mouth daily.   30 capsule  6  . fexofenadine (ALLEGRA) 60 MG tablet Take 1 tablet (60 mg total) by mouth 2 (two) times daily.  60 tablet  0  . FLUoxetine (PROZAC) 40 MG capsule Take 1 capsule (40 mg total) by mouth daily.  90 capsule  3  . Levothyroxine Sodium 125 MCG CAPS Take 1 capsule (125 mcg total) by mouth daily.  30 capsule  6  . loratadine (CLARITIN) 10 MG tablet Take 10 mg by mouth daily.        . rosuvastatin (CRESTOR) 40 MG tablet Take 1 tablet (40 mg total) by mouth daily.  90 tablet  3  . triamcinolone (KENALOG) 0.1 % ointment Apply to affected area once per day for no longer than 2 weeks.  90 g  0  . warfarin (COUMADIN) 5 MG tablet Take 1 tablet (5 mg total) by mouth as directed.  40 tablet  3   Current Facility-Administered Medications on File Prior to Visit  Medication Dose Route Frequency Provider Last Rate Last Dose  . TDaP (BOOSTRIX) injection 0.5 mL  0.5 mL Intramuscular Once Macy Mis, MD       Allergies  Allergen Reactions  . Penicillins Itching  . Percocet (Oxycodone-Acetaminophen) Itching     Review of Systems  Constitutional: Negative for fever, chills, diaphoresis and fatigue.  Musculoskeletal: Negative for back pain, joint swelling, arthralgias and  gait problem.  Neurological: Negative for seizures and weakness.       Objective:   Physical Exam  Constitutional: She is oriented to person, place, and time. She appears well-developed and well-nourished.       BP 147/76   Pulmonary/Chest: Effort normal.  Musculoskeletal:       Right wrist with FROM. TTP in the ulnar side along the ECU. Pain in the ulnar side, pain with ulnar deviation. Neurovascularly intact.   Neurological: She is alert and oriented to person, place, and time.  Skin: Skin is warm. No rash noted. No erythema.  Psychiatric: She has a normal mood and affect. Her behavior is normal. Thought content normal.    Musculoskeletal U/S : hypoechoic halo around the ECU.NO ECU tear or dislocation.         Assessment & Plan:   1. Right wrist pain  Ketoprofen POWD   Continue using brace Ketoprofen compound Ice 20 min bid x 4 weeks F/U in 4 weeks.

## 2012-05-15 ENCOUNTER — Other Ambulatory Visit: Payer: Self-pay | Admitting: Cardiology

## 2012-05-15 NOTE — Telephone Encounter (Signed)
..   Requested Prescriptions   Pending Prescriptions Disp Refills  . TAZTIA XT 120 MG 24 hr capsule [Pharmacy Med Name: TAZTIA XT 120MG /24HRCAP] 30 each 6    Sig: TAKE ONE  BY MOUTH EVERY DAY

## 2012-05-19 ENCOUNTER — Other Ambulatory Visit: Payer: Self-pay | Admitting: *Deleted

## 2012-05-19 MED ORDER — WARFARIN SODIUM 5 MG PO TABS: ORAL_TABLET | ORAL | Status: DC

## 2012-05-24 ENCOUNTER — Ambulatory Visit: Payer: 59 | Admitting: Family Medicine

## 2012-05-25 ENCOUNTER — Ambulatory Visit: Payer: 59 | Admitting: Family Medicine

## 2012-06-05 ENCOUNTER — Other Ambulatory Visit: Payer: Self-pay | Admitting: *Deleted

## 2012-06-05 DIAGNOSIS — E039 Hypothyroidism, unspecified: Secondary | ICD-10-CM

## 2012-06-05 MED ORDER — LEVOTHYROXINE SODIUM 125 MCG PO CAPS: 125.0000 ug | ORAL_CAPSULE | Freq: Every day | ORAL | Status: DC

## 2012-06-07 ENCOUNTER — Ambulatory Visit: Payer: 59 | Admitting: Family Medicine

## 2012-06-08 ENCOUNTER — Ambulatory Visit: Payer: 59 | Admitting: Sports Medicine

## 2012-06-22 ENCOUNTER — Encounter: Payer: Self-pay | Admitting: Family Medicine

## 2012-06-22 ENCOUNTER — Ambulatory Visit (INDEPENDENT_AMBULATORY_CARE_PROVIDER_SITE_OTHER): Payer: 59 | Admitting: Family Medicine

## 2012-06-22 VITALS — BP 133/84 | HR 77 | Temp 98.7°F | Ht 63.0 in | Wt 196.6 lb

## 2012-06-22 DIAGNOSIS — J069 Acute upper respiratory infection, unspecified: Secondary | ICD-10-CM

## 2012-06-22 MED ORDER — CLARITHROMYCIN 500 MG PO TABS: 1000.0000 mg | ORAL_TABLET | Freq: Once | ORAL | Status: AC

## 2012-06-22 NOTE — Patient Instructions (Addendum)
Take the antibiotic one pill a day for 7 days. Come back and see [place] if you're still feeling bad next week. It was good to see you and I hope you feel better!

## 2012-06-23 DIAGNOSIS — J069 Acute upper respiratory infection, unspecified: Secondary | ICD-10-CM | POA: Insufficient documentation

## 2012-06-23 NOTE — Assessment & Plan Note (Signed)
Plan to treat due to length of symptoms and no improvement.   Will prescribe Biaxin. Instructed patient to return in 1 week for checkup or sooner if worsening or no improvement.

## 2012-06-23 NOTE — Progress Notes (Signed)
  Subjective:    Patient ID: [name], female    DOB: 10-25-1951, 61 y.o.   MRN: [id number]  HPI  URI symptoms:  Present for past 9 days.  Describes rhinorrhea, sinus congestion, mild cough.  Has tried multiple OTC sinus medications without relief.  Sick contacts are her husband who had similar symptoms but these have resolved.  No fevers or chills. No nausea or vomiting.     Review of Systems See HPI above for review of systems.       Objective:   Physical Exam  BP 133/84  Pulse 77  Temp 98.7 F (37.1 C) (Oral)  Ht 5\' 3"  (1.6 m)  Wt 196 lb 9.6 oz (89.177 kg)  BMI 34.83 kg/m2  SpO2 92% Gen:  Patient sitting on exam table, appears stated age in no acute distress Head: Normocephalic atraumatic Eyes: EOMI, PERRL, sclera and conjunctiva non-erythematous Nose:  Nasal turbinates grossly enlarged bilaterally. Some exudates noted. Tender to palpation of maxillary sinus  Mouth: Mucosa membranes moist. Tonsils +2, nonenlarged, non-erythematous. Neck: No cervical lymphadenopathy noted Heart:  RRR, no murmurs auscultated. Pulm:  Clear to auscultation bilaterally with good air movement.  No wheezes or rales noted.          Assessment & Plan:

## 2012-08-16 ENCOUNTER — Ambulatory Visit (INDEPENDENT_AMBULATORY_CARE_PROVIDER_SITE_OTHER): Payer: 59 | Admitting: *Deleted

## 2012-08-16 DIAGNOSIS — I4891 Unspecified atrial fibrillation: Secondary | ICD-10-CM

## 2012-08-16 DIAGNOSIS — Z7901 Long term (current) use of anticoagulants: Secondary | ICD-10-CM

## 2012-08-16 LAB — POCT INR: INR: 1.4

## 2012-08-19 ENCOUNTER — Emergency Department (HOSPITAL_COMMUNITY)
Admission: EM | Admit: 2012-08-19 | Discharge: 2012-08-19 | Disposition: A | Discharge status: Home / Self Care | Payer: 59 | Attending: Emergency Medicine | Admitting: Emergency Medicine

## 2012-08-19 ENCOUNTER — Encounter (HOSPITAL_COMMUNITY): Payer: Self-pay | Admitting: Family Medicine

## 2012-08-19 DIAGNOSIS — M25519 Pain in unspecified shoulder: Secondary | ICD-10-CM | POA: Insufficient documentation

## 2012-08-19 DIAGNOSIS — R791 Abnormal coagulation profile: Secondary | ICD-10-CM | POA: Insufficient documentation

## 2012-08-19 DIAGNOSIS — E039 Hypothyroidism, unspecified: Secondary | ICD-10-CM | POA: Insufficient documentation

## 2012-08-19 DIAGNOSIS — I10 Essential (primary) hypertension: Secondary | ICD-10-CM | POA: Insufficient documentation

## 2012-08-19 DIAGNOSIS — I4891 Unspecified atrial fibrillation: Secondary | ICD-10-CM | POA: Insufficient documentation

## 2012-08-19 DIAGNOSIS — Z880 Allergy status to penicillin: Secondary | ICD-10-CM | POA: Insufficient documentation

## 2012-08-19 DIAGNOSIS — Z87891 Personal history of nicotine dependence: Secondary | ICD-10-CM | POA: Insufficient documentation

## 2012-08-19 DIAGNOSIS — Z7901 Long term (current) use of anticoagulants: Secondary | ICD-10-CM | POA: Insufficient documentation

## 2012-08-19 LAB — BASIC METABOLIC PANEL
BUN: 18 mg/dL (ref 6–23)
CO2: 20 mEq/L (ref 19–32)
Calcium: 9.5 mg/dL (ref 8.4–10.5)
Chloride: 106 mEq/L (ref 96–112)
Creatinine, Ser: 0.95 mg/dL (ref 0.50–1.10)
GFR calc Af Amer: 72 mL/min — ABNORMAL LOW (ref >90)
GFR calc non Af Amer: 62 mL/min — ABNORMAL LOW (ref >90)
Glucose, Bld: 126 mg/dL — ABNORMAL HIGH (ref 70–99)
Potassium: 3.8 mEq/L (ref 3.5–5.1)
Sodium: 140 mEq/L (ref 135–145)

## 2012-08-19 LAB — CBC
HCT: 38.4 % (ref 36.0–46.0)
Hemoglobin: 13.3 g/dL (ref 12.0–15.0)
MCH: 30.2 pg (ref 26.0–34.0)
MCHC: 34.6 g/dL (ref 30.0–36.0)
MCV: 87.3 fL (ref 78.0–100.0)
Platelets: 232 10*3/uL (ref 150–400)
RBC: 4.4 MIL/uL (ref 3.87–5.11)
RDW: 13 % (ref 11.5–15.5)
WBC: 9.4 10*3/uL (ref 4.0–10.5)

## 2012-08-19 LAB — PROTIME-INR
INR: 1.65 — ABNORMAL HIGH (ref 0.00–1.49)
Prothrombin Time: 19.8 seconds — ABNORMAL HIGH (ref 11.6–15.2)

## 2012-08-19 LAB — TROPONIN I: Troponin I: <0.3 ng/mL (ref <0.30)

## 2012-08-19 MED ORDER — DILTIAZEM HCL ER COATED BEADS 180 MG PO CP24: 180.0000 mg | ORAL_CAPSULE | Freq: Every day | ORAL | Status: DC

## 2012-08-19 MED ORDER — WARFARIN SODIUM 5 MG PO TABS
5.0000 mg | ORAL_TABLET | ORAL | Status: AC
  Administered 2012-08-19: 5 mg via ORAL
  Filled 2012-08-19 (×2): qty 1

## 2012-08-19 MED ORDER — DILTIAZEM HCL ER COATED BEADS 120 MG PO CP24: 120.0000 mg | ORAL_CAPSULE | Freq: Every day | ORAL | Status: DC

## 2012-08-19 MED ORDER — DILTIAZEM HCL 25 MG/5ML IV SOLN
15.0000 mg | Freq: Once | INTRAVENOUS | Status: AC
  Administered 2012-08-19: 15 mg via INTRAVENOUS
  Filled 2012-08-19: qty 5

## 2012-08-19 MED ORDER — WARFARIN - PHYSICIAN DOSING INPATIENT: Freq: Every day | Status: DC

## 2012-08-19 MED ORDER — DILTIAZEM HCL 60 MG PO TABS
120.0000 mg | ORAL_TABLET | Freq: Once | ORAL | Status: AC
  Administered 2012-08-19: 120 mg via ORAL
  Filled 2012-08-19 (×2): qty 2

## 2012-08-19 NOTE — ED Provider Notes (Signed)
History    61yF with palpitations and L shoulder pain. Woke up with symptoms around 0400 today. Palpitations constant and feels like symptoms had previously with afib. Denies CP or SOB. L shoulder pain is achy and worse with movement. Improved since woke up but still has. No unusual leg pain or swelling. No n/v. Pt with pt of paroxysmal afib. Is on warfarin. Reports compliance with medications. Denies recent changes since last cardiology appointment where she reports atenolol was increased to 50mg . Is a smoker. Denies recent ETOH ingestion. Hx of hypothyroid.   CSN: [id number]  Arrival date & time 08/19/12  1033   First MD Initiated Contact with Patient 08/19/12 1045      Chief Complaint  Patient presents with  . Tachycardia  . Shoulder Pain    (Consider location/radiation/quality/duration/timing/severity/associated sxs/prior treatment) HPI  Past Medical History  Diagnosis Date  . Hypertension   . Atrial fibrillation     Past Surgical History  Procedure Date  . Joint replacement   . Tubal ligation     Family History  Problem Relation Age of Onset  . Diabetes Mother   . Heart disease Mother   . Heart disease Father   . Cancer Brother     intestinal  . Cancer Brother     throat ca  . Cancer Brother     bladder    History  Substance Use Topics  . Smoking status: Former Smoker -- 1.5 packs/day for 15 years  . Smokeless tobacco: Not on file  . Alcohol Use: 1.8 oz/week    3 Cans of beer per week    OB History    Grav Para Term Preterm Abortions TAB SAB Ect Mult Living                  Review of Systems   Review of symptoms negative unless otherwise noted in HPI.   Allergies  Penicillins and Percocet  Home Medications   Current Outpatient Rx  Name Route Sig Dispense Refill  . ATENOLOL 50 MG PO TABS Oral Take 1 tablet (50 mg total) by mouth daily. 30 tablet 6  . DILTIAZEM HCL ER BEADS 120 MG PO CP24 Oral Take 120 mg by mouth daily.    Marland Kitchen LEVOTHYROXINE  SODIUM 125 MCG PO TABS Oral Take 125 mcg by mouth daily.    Marland Kitchen ROSUVASTATIN CALCIUM 40 MG PO TABS Oral Take 1 tablet (40 mg total) by mouth daily. 90 tablet 3  . WARFARIN SODIUM 5 MG PO TABS Oral Take 7.5 mg by mouth daily.    Marland Kitchen LORATADINE 10 MG PO TABS Oral Take 10 mg by mouth daily as needed.       BP 150/86  Pulse 118  Temp 98.1 F (36.7 C) (Oral)  Resp 15  SpO2 94%  Physical Exam  Nursing note and vitals reviewed. Constitutional: She appears well-developed and well-nourished. No distress.  HENT:  Head: Normocephalic and atraumatic.  Eyes: Conjunctivae are normal. Right eye exhibits no discharge. Left eye exhibits no discharge.  Neck: Neck supple.  Cardiovascular: Normal heart sounds.  Exam reveals no gallop and no friction rub.   No murmur heard.      Tachycardic. irregularly irregular.  Pulmonary/Chest: Effort normal and breath sounds normal. No respiratory distress.  Abdominal: Soft. She exhibits no distension. There is no tenderness.  Musculoskeletal: She exhibits no edema and no tenderness.       Mild lower extremity edema, symmetric as compared to each other. No calf tenderness.  Negative Homan's. No palpable cords. L shoulder nontender. Pt reports increase in pain with movement though. Neurovascualarly intact distally.   Neurological: She is alert.  Skin: Skin is warm and dry.  Psychiatric: She has a normal mood and affect. Her behavior is normal. Thought content normal.    ED Course  Procedures (including critical care time)  Labs Reviewed  BASIC METABOLIC PANEL - Abnormal; Notable for the following:    Glucose, Bld 126 (*)     GFR calc non Af Amer 62 (*)     GFR calc Af Amer 72 (*)     All other components within normal limits  PROTIME-INR - Abnormal; Notable for the following:    Prothrombin Time 19.8 (*)     INR 1.65 (*)     All other components within normal limits  TROPONIN I  CBC   No results found.  EKG:  Rhythm: sfib with RVR Rate: 117 Axis:  normal Intervals: normal ST segments: NS ST changes   1. Atrial fibrillation with rapid ventricular response   2. Shoulder pain       MDM  61y female with palpitations. In atrial fibrillation with RVR, now rate controlled after diltiazem bolus with HR 80-100. Patient has a history of paroxysmal atrial fibrillation. She is followed by cardiology. Last echo was in May of 2011. This showed a normal EF, no regional wall abnormalities no valvular abnormalities. Patient is anticoagulated with Coumadin. Her INR is subtherapeutic.  Pt is followed in coumadin clinic. Just had blood drawn and told to increase coumadin to 7.5mg  for two days which she did Thursday and yesterday. Improving but still low. Additional dose given in ED and pt instructed to continue at previos dose. Discussed needs to be re-checked. C/o L shoulder pain. Consider anginal equivalent but doubt given reproducibility with ROM of her shoulder. Trop is normal. EKG afib RVR with NS ST changes. She is on atenolol and diltiazem currently. Will increase cardizem to 180mg  unless cardiology recommends otherwise. Outpt FU with Dr Antoine Poche.  12:59 PM Discussed with cards. Recommending 120mg  diltiazem PO prior to DC and increasing cardizem to 240mg . Med changes discussed with pt and understands need to follow-up.             Raeford Razor, MD 08/19/12 [phone]

## 2012-08-19 NOTE — ED Notes (Signed)
Pt amb to BR without problem. States she feels much better

## 2012-08-19 NOTE — ED Notes (Signed)
Per pt heart started racing about 9 am this morning and sts pain in her let shoulder. Pt hx of afib. Pt also SOB

## 2012-08-19 NOTE — ED Notes (Signed)
Awaiting med from Pharmacy. Pt resting quietly, husband at bedside. No pain no shob

## 2012-08-19 NOTE — ED Notes (Signed)
Pt to monitor. Family member at bedside. Pt states she has been under a lot of stress lately, and felt her heart start fluttering this am

## 2012-08-31 ENCOUNTER — Ambulatory Visit (INDEPENDENT_AMBULATORY_CARE_PROVIDER_SITE_OTHER): Payer: 59

## 2012-08-31 DIAGNOSIS — I4891 Unspecified atrial fibrillation: Secondary | ICD-10-CM

## 2012-08-31 DIAGNOSIS — Z7901 Long term (current) use of anticoagulants: Secondary | ICD-10-CM

## 2012-08-31 LAB — POCT INR: INR: 2.7

## 2012-09-06 ENCOUNTER — Ambulatory Visit (INDEPENDENT_AMBULATORY_CARE_PROVIDER_SITE_OTHER): Payer: 59 | Admitting: Physician Assistant

## 2012-09-06 ENCOUNTER — Telehealth: Payer: Self-pay | Admitting: *Deleted

## 2012-09-06 ENCOUNTER — Encounter: Payer: Self-pay | Admitting: Physician Assistant

## 2012-09-06 VITALS — BP 140/72 | HR 49 | Ht 63.0 in | Wt 196.8 lb

## 2012-09-06 DIAGNOSIS — I10 Essential (primary) hypertension: Secondary | ICD-10-CM

## 2012-09-06 DIAGNOSIS — E039 Hypothyroidism, unspecified: Secondary | ICD-10-CM

## 2012-09-06 DIAGNOSIS — I4891 Unspecified atrial fibrillation: Secondary | ICD-10-CM

## 2012-09-06 DIAGNOSIS — R001 Bradycardia, unspecified: Secondary | ICD-10-CM

## 2012-09-06 DIAGNOSIS — I498 Other specified cardiac arrhythmias: Secondary | ICD-10-CM

## 2012-09-06 LAB — TSH: TSH: 0.59 u[IU]/mL (ref 0.35–5.50)

## 2012-09-06 MED ORDER — ATENOLOL 50 MG PO TABS: 25.0000 mg | ORAL_TABLET | Freq: Every day | ORAL | Status: DC

## 2012-09-06 NOTE — Telephone Encounter (Signed)
pt notified about labs w/verbal understanding today 

## 2012-09-06 NOTE — Progress Notes (Signed)
[address] [place], [place]  [address] Phone: [phone] Fax:  [phone]  Date:  09/06/2012   Name:  [name]   DOB:  11-Dec-1951   MRN:  [id number]  PCP:  DE LA CRUZ,IVY, DO  Primary Cardiologist:  Dr. Rollene Rotunda  Primary Electrophysiologist:  None    History of Present Illness: [name] is a 61 y.o. female who returns for followup after a visit to the emergency room.  She has a history of atrial fibrillation, on chronic Coumadin therapy, reported CAD with a remote history of PCI, HL, hypothyroidism. Last seen by Dr. Antoine Poche 11/12. She was in sinus rhythm at that time. She presented to the emergency room recently on 08/19/12 with palpitations and shoulder pain. She was in atrial fibrillation with rapid ventricular rate. Her diltiazem was increased and she was set up for followup today.  Labs (08/19/12): K 3.8, creatinine 0.95, TnI < 0.30, Hg 13.3, INR 1.65.  She denies any further palpitations or dyspnea. She denies chest pain. She denies syncope. She denies orthopnea, PND. She does have mild ankle edema. This is fairly stable. She has dyspnea with more extreme activities. She does note some lightheadedness with standing quickly at times.    Past Medical History  Diagnosis Date  . Hypertension   . Atrial fibrillation     Echo 5/11:  EF 65%, NL diast fxn  . CAD (coronary artery disease)     a. reported hx of PCI in 2000 (?records);  b.  Myoview 5/10:  EF 71%, no ischemia  . HLD (hyperlipidemia)   . Hypothyroidism     Current Outpatient Prescriptions  Medication Sig Dispense Refill  . atenolol (TENORMIN) 50 MG tablet Take 1 tablet (50 mg total) by mouth daily.  30 tablet  6  . diltiazem (CARDIZEM CD) 120 MG 24 hr capsule Take 1 capsule (120 mg total) by mouth daily.  30 capsule  0  . levothyroxine (SYNTHROID, LEVOTHROID) 125 MCG tablet Take 125 mcg by mouth daily.      Marland Kitchen loratadine (CLARITIN) 10 MG tablet Take 10 mg by mouth daily  as needed.       . rosuvastatin (CRESTOR) 40 MG tablet Take 1 tablet (40 mg total) by mouth daily.  90 tablet  3  . warfarin (COUMADIN) 5 MG tablet Take 7.5 mg by mouth daily.      Marland Kitchen DISCONTD: diltiazem (DILACOR XR) 120 MG 24 hr capsule Take 1 capsule (120 mg total) by mouth daily.  30 capsule  6   Current Facility-Administered Medications  Medication Dose Route Frequency Provider Last Rate Last Dose  . TDaP (BOOSTRIX) injection 0.5 mL  0.5 mL Intramuscular Once Macy Mis, MD        Allergies: Allergies  Allergen Reactions  . Penicillins Itching  . Percocet (Oxycodone-Acetaminophen) Itching    History  Substance Use Topics  . Smoking status: Former Smoker -- 1.5 packs/day for 15 years  . Smokeless tobacco: Not on file  . Alcohol Use: 1.8 oz/week    3 Cans of beer per week     ROS:  Please see the history of present illness.   No fevers, chills, cough, melena, vomiting, diarrhea.  All other systems reviewed and negative.   PHYSICAL EXAM: VS:  BP 140/72  Pulse 49  Ht 5\' 3"  (1.6 m)  Wt 196 lb 12.8 oz (89.268 kg)  BMI 34.86 kg/m2 Well nourished, well developed, in no acute distress HEENT: normal  Neck: no JVD Cardiac:  normal S1, S2; RRR; no murmur Lungs:  clear to auscultation bilaterally, no wheezing, rhonchi or rales Abd: soft, nontender, no hepatomegaly Ext: no edema Skin: warm and dry Neuro:  CNs 2-12 intact, no focal abnormalities noted  EKG:  Sinus rhythm, heart rate 49, normal axis, no acute changes  ASSESSMENT AND PLAN:  1. Atrial Fibrillation: She is back in sinus rhythm. She actually continued her usual dose of diltiazem and atenolol. She remains on Coumadin. Her INR is now therapeutic. I will obtain a TSH. Her heart rate is somewhat slow and she might be somewhat symptomatic with that. I have asked her to decrease her atenolol to 25 mg daily. She can take atenolol 25 mg when necessary for rapid palpitations. Followup with Dr. Antoine Poche in November as scheduled  or sooner as needed.  2. Hypertension: This is usually much better control. She does admit to having 2 beers last night. Continue current therapy. Continue to monitor blood pressure.  3. Hypothyroidism: Obtain a followup TSH today.  SignedTereso Newcomer, PA-C  10:01 AM 09/06/2012

## 2012-09-06 NOTE — Patient Instructions (Addendum)
Your physician has recommended you make the following change in your medication: DECREASE ATENOLOL TO 25 MG DAILY;  PER SCOTT WEAVER, PAC IF YOU FEEL LIKE YOU ARE BACK IN A-FIB YOU CAN TAKE AN EXTRA 25 MG OF ATENOLOL  Your physician recommends that you return for lab work in: TODAY TSH 427.31  KEEP APPOINTMENT WITH DR. HOCHREIN 11/09/12 @ 8:45 AM   NO OTHER CHANGES WERE MADE TODAY

## 2012-09-06 NOTE — Telephone Encounter (Signed)
Message copied by Tarri Fuller on Wed Sep 06, 2012  5:12 PM ------      Message from: [place], [place] T      Created: Wed Sep 06, 2012  4:56 PM       TSH normal      Tereso Newcomer, [place]  4:56 PM 09/06/2012

## 2012-09-21 ENCOUNTER — Ambulatory Visit (INDEPENDENT_AMBULATORY_CARE_PROVIDER_SITE_OTHER): Payer: 59 | Admitting: *Deleted

## 2012-09-21 DIAGNOSIS — Z7901 Long term (current) use of anticoagulants: Secondary | ICD-10-CM

## 2012-09-21 DIAGNOSIS — I4891 Unspecified atrial fibrillation: Secondary | ICD-10-CM

## 2012-09-21 LAB — POCT INR: INR: 2.4

## 2012-09-21 NOTE — Addendum Note (Signed)
Addended by: ADAMS, KIMBERLY G on: 09/21/2012 09:58 AM   Modules accepted: Level of Service  

## 2012-10-19 ENCOUNTER — Ambulatory Visit (INDEPENDENT_AMBULATORY_CARE_PROVIDER_SITE_OTHER): Payer: 59 | Admitting: *Deleted

## 2012-10-19 DIAGNOSIS — I4891 Unspecified atrial fibrillation: Secondary | ICD-10-CM

## 2012-10-19 DIAGNOSIS — Z7901 Long term (current) use of anticoagulants: Secondary | ICD-10-CM

## 2012-10-19 LAB — POCT INR: INR: 1.8

## 2012-10-22 ENCOUNTER — Emergency Department (INDEPENDENT_AMBULATORY_CARE_PROVIDER_SITE_OTHER)
Admission: EM | Admit: 2012-10-22 | Discharge: 2012-10-22 | Disposition: A | Discharge status: Home / Self Care | Payer: 59 | Source: Home / Self Care | Attending: Emergency Medicine | Admitting: Emergency Medicine

## 2012-10-22 ENCOUNTER — Encounter (HOSPITAL_COMMUNITY): Payer: Self-pay

## 2012-10-22 DIAGNOSIS — N950 Postmenopausal bleeding: Secondary | ICD-10-CM

## 2012-10-22 LAB — POCT URINALYSIS DIP (DEVICE)
Bilirubin Urine: NEGATIVE
Glucose, UA: NEGATIVE mg/dL
Ketones, ur: NEGATIVE mg/dL
Nitrite: NEGATIVE
Protein, ur: 30 mg/dL — AB
Specific Gravity, Urine: 1.015 (ref 1.005–1.030)
Urobilinogen, UA: 1 mg/dL (ref 0.0–1.0)
pH: 7 (ref 5.0–8.0)

## 2012-10-22 LAB — WET PREP, GENITAL
Trich, Wet Prep: NONE SEEN
Yeast Wet Prep HPF POC: NONE SEEN

## 2012-10-22 MED ORDER — NITROFURANTOIN MONOHYD MACRO 100 MG PO CAPS: 100.0000 mg | ORAL_CAPSULE | Freq: Two times a day (BID) | ORAL | Status: AC

## 2012-10-22 NOTE — ED Notes (Signed)
Discussed medication compliance. Will call her MD office in AM for follow up appointment to evaluate her issues and poss ultrasound; teach back used

## 2012-10-22 NOTE — ED Provider Notes (Signed)
History     CSN: [id number]  Arrival date & time 10/22/12  1322   First MD Initiated Contact with Patient 10/22/12 1403      Chief Complaint  Patient presents with  . Vaginal Bleeding    (Consider location/radiation/quality/duration/timing/severity/associated sxs/prior treatment) HPI Comments: Patient presents urgent care this afternoon complaining of pressure-type of fullness sensation in her lower back and lower abdomen. Have noticed for several days that she have observe some bright red blood on the toilet paper that she's uncertain if this is coming from her urine or her vaginal area. Patient denies any burning, pressure or discomfort with urination. Patient also denies any fevers, nausea vomiting or constitutional symptoms such as changes in appetite or unintentional weight loss.  Patient is a 61 y.o. female presenting with vaginal bleeding. The history is provided by the patient.  Vaginal Bleeding This is a new problem. The problem occurs rarely. Associated symptoms include abdominal pain. Pertinent negatives include no headaches and no shortness of breath. Nothing relieves the symptoms.    Past Medical History  Diagnosis Date  . Hypertension   . Atrial fibrillation     Echo 5/11:  EF 65%, NL diast fxn  . CAD (coronary artery disease)     a. reported hx of PCI in 2000 (?records);  b.  Myoview 5/10:  EF 71%, no ischemia  . HLD (hyperlipidemia)   . Hypothyroidism     Past Surgical History  Procedure Date  . Joint replacement   . Tubal ligation     Family History  Problem Relation Age of Onset  . Diabetes Mother   . Heart disease Mother   . Heart disease Father   . Cancer Brother     intestinal  . Cancer Brother     throat ca  . Cancer Brother     bladder    History  Substance Use Topics  . Smoking status: Former Smoker -- 1.5 packs/day for 15 years  . Smokeless tobacco: Not on file  . Alcohol Use: 1.8 oz/week    3 Cans of beer per week    OB History      Grav Para Term Preterm Abortions TAB SAB Ect Mult Living                  Review of Systems  Constitutional: Negative for activity change and appetite change.  Respiratory: Negative for shortness of breath.   Gastrointestinal: Positive for abdominal pain.  Genitourinary: Positive for vaginal bleeding. Negative for dysuria, urgency, frequency, flank pain, vaginal pain, menstrual problem and pelvic pain.  Musculoskeletal: Negative for arthralgias.  Neurological: Negative for dizziness and headaches.    Allergies  Penicillins and Percocet  Home Medications   Current Outpatient Rx  Name Route Sig Dispense Refill  . ATENOLOL 50 MG PO TABS Oral Take 0.5 tablets (25 mg total) by mouth daily.    Marland Kitchen DILTIAZEM HCL ER COATED BEADS 120 MG PO CP24 Oral Take 1 capsule (120 mg total) by mouth daily. 30 capsule 0  . LEVOTHYROXINE SODIUM 125 MCG PO TABS Oral Take 125 mcg by mouth daily.    . WARFARIN SODIUM 5 MG PO TABS Oral Take 7.5 mg by mouth daily.    Marland Kitchen LORATADINE 10 MG PO TABS Oral Take 10 mg by mouth daily as needed.     Marland Kitchen NITROFURANTOIN MONOHYD MACRO 100 MG PO CAPS Oral Take 1 capsule (100 mg total) by mouth 2 (two) times daily. 10 capsule 0  . ROSUVASTATIN  CALCIUM 40 MG PO TABS Oral Take 1 tablet (40 mg total) by mouth daily. 90 tablet 3    BP 139/73  Pulse 60  Temp 98.1 F (36.7 C) (Oral)  Resp 18  SpO2 94%  Physical Exam  Nursing note and vitals reviewed. Constitutional: Vital signs are normal. She appears well-developed and well-nourished.  Non-toxic appearance. She does not have a sickly appearance. She does not appear ill. No distress.  Cardiovascular: Normal rate.  Exam reveals no gallop and no friction rub.   No murmur heard. Abdominal: She exhibits no distension. There is no tenderness. There is no rebound.  Genitourinary:    No labial fusion. There is no rash on the right labia. There is no rash on the left labia. There is bleeding around the vagina. No erythema around  the vagina. No foreign body around the vagina. No vaginal discharge found.  Neurological: She is alert.  Skin: No rash noted. No erythema.    ED Course  Procedures (including critical care time)  Labs Reviewed  POCT URINALYSIS DIP (DEVICE) - Abnormal; Notable for the following:    Hgb urine dipstick LARGE (*)     Protein, ur 30 (*)     Leukocytes, UA SMALL (*)  Biochemical Testing Only. Please order routine urinalysis from main lab if confirmatory testing is needed.   All other components within normal limits  WET PREP, GENITAL - Abnormal; Notable for the following:    Clue Cells Wet Prep HPF POC RARE (*)     WBC, Wet Prep HPF POC MANY (*)     All other components within normal limits  GC/CHLAMYDIA PROBE AMP, GENITAL  URINE CULTURE   No results found.   1. Postmenopausal vaginal bleeding       MDM  Vaginal Bleeding . Exam was somewhat consistent with a vaginal source as patient was noted to have superficial vaginal mucosa areas of recent bleeding. Patient was incidentally noted to have a discrete yellowish discharge in the cervical os. No active bleeding was observed from within the cervix. Have indicated to patient that she needs to see her primary care Dr. to be further evaluated for pulse pulsatile vaginal bleeding and we discussed the potential reasons including endometrial carcinoma. I explained to patient that this could also be a sign of a deficient estrogenic status causing some vaginal excoriations and abrasions. Patient was noted to have some lower pelvic discomfort along with hematuria and trace leukocytes a urine culture has been sent and patient has been started on Macrobid and for culture results available. Patient agree with treatment plan and will followup with her primary care Dr. next week. She will call them tomorrow     Jimmie Molly, MD 10/22/12 1530

## 2012-10-22 NOTE — ED Notes (Signed)
States she has been having lower abdominal area fullness sensation and in lower back. States has been having bright red blood on toilet paper after voiding

## 2012-10-23 LAB — URINE CULTURE: Colony Count: >=100000

## 2012-10-23 LAB — GC/CHLAMYDIA PROBE AMP, GENITAL
Chlamydia, DNA Probe: NEGATIVE
GC Probe Amp, Genital: NEGATIVE

## 2012-10-25 ENCOUNTER — Encounter: Payer: Self-pay | Admitting: Family Medicine

## 2012-10-25 ENCOUNTER — Ambulatory Visit (INDEPENDENT_AMBULATORY_CARE_PROVIDER_SITE_OTHER): Payer: 59 | Admitting: Family Medicine

## 2012-10-25 VITALS — BP 130/72 | HR 64 | Temp 97.9°F | Ht 63.0 in | Wt 196.6 lb

## 2012-10-25 DIAGNOSIS — N950 Postmenopausal bleeding: Secondary | ICD-10-CM

## 2012-10-25 DIAGNOSIS — Z23 Encounter for immunization: Secondary | ICD-10-CM

## 2012-10-25 NOTE — Assessment & Plan Note (Signed)
Patient had similar bleeding in 2009 and U/S did show an endometrial stripe 8 mm wide.  Patient had a biopsy done but results not in EPIC and she does remember results. - Will repeat trans-vaginal [place] and pelvic US - If > 5 mm, will refer to [facility] - Patient wishes to be under general anesthesia if endometrial biopsy needed

## 2012-10-25 NOTE — Patient Instructions (Addendum)
We will set up appointment for your ultrasounds. If you develop further bleeding, please call your MD. I will call you with results of imaging tests 1-2 days after you have it done.

## 2012-10-25 NOTE — Progress Notes (Signed)
  Subjective:    Patient ID: [name], female    DOB: [birth year], 61 y.o.   MRN: [id number]  HPI  Patient presents to clinic for follow up post-menopausal vaginal bleeding.  On 10/27, she had vaginal bleeding similar to a period.  The next 2 days, she experienced some spotting.  She went to Urgent Care on 10/27.  Pelvic exam did show small amounts of old blood near cervix.  Urine culture no growth.  GC/Chlamydia negative.  She was given Rx for macrobid but did not take it because she was not having any pain with urination.  Patient had similar bleeding in 2009.  Endometrial stripe was 8-9 mm.  She had a biopsy done but cannot remember the results.  She denies any bleeding today.  Denies any unintentional weight loss, change in appetite, chills, nausea/vomiting, fevers.  Denies any discharge, dysuria, low back, or pelvic pain.  Patient stopped Coumadin for Atrial Fib x 4 days.  Told her it was okay to resume today.   Review of Systems     Objective:   Physical Exam  Constitutional: No distress.       Pleasant, Obese  Abdominal: Soft. Bowel sounds are normal. She exhibits no distension. There is no tenderness. There is no rebound and no guarding.  Musculoskeletal:       No CVA tenderness      Assessment & Plan:

## 2012-10-30 ENCOUNTER — Ambulatory Visit (HOSPITAL_COMMUNITY)
Admission: RE | Admit: 2012-10-30 | Discharge: 2012-10-30 | Disposition: A | Discharge status: Home / Self Care | Payer: 59 | Source: Ambulatory Visit | Attending: Family Medicine | Admitting: Family Medicine

## 2012-10-30 DIAGNOSIS — N949 Unspecified condition associated with female genital organs and menstrual cycle: Secondary | ICD-10-CM | POA: Insufficient documentation

## 2012-10-30 DIAGNOSIS — N950 Postmenopausal bleeding: Secondary | ICD-10-CM | POA: Insufficient documentation

## 2012-11-01 ENCOUNTER — Telehealth: Payer: Self-pay | Admitting: Family Medicine

## 2012-11-01 NOTE — Telephone Encounter (Signed)
Please call patient to schedule an appointment to discuss results of U/S.  Thanks.

## 2012-11-01 NOTE — Telephone Encounter (Signed)
LMOVM for pt to return call and schedule appt. Fleeger, Jessica Dawn  

## 2012-11-08 ENCOUNTER — Encounter: Payer: Self-pay | Admitting: Family Medicine

## 2012-11-08 ENCOUNTER — Ambulatory Visit (INDEPENDENT_AMBULATORY_CARE_PROVIDER_SITE_OTHER): Payer: 59 | Admitting: Family Medicine

## 2012-11-08 VITALS — BP 153/72 | HR 64 | Temp 97.9°F | Ht 63.0 in | Wt 198.4 lb

## 2012-11-08 DIAGNOSIS — R109 Unspecified abdominal pain: Secondary | ICD-10-CM

## 2012-11-08 DIAGNOSIS — N939 Abnormal uterine and vaginal bleeding, unspecified: Secondary | ICD-10-CM

## 2012-11-08 DIAGNOSIS — N926 Irregular menstruation, unspecified: Secondary | ICD-10-CM

## 2012-11-08 DIAGNOSIS — N950 Postmenopausal bleeding: Secondary | ICD-10-CM

## 2012-11-08 LAB — POCT URINALYSIS DIPSTICK
Bilirubin, UA: NEGATIVE
Blood, UA: NEGATIVE
Glucose, UA: NEGATIVE
Ketones, UA: NEGATIVE
Nitrite, UA: NEGATIVE
Protein, UA: NEGATIVE
Spec Grav, UA: 1.015
Urobilinogen, UA: 0.2
pH, UA: 7

## 2012-11-08 LAB — POCT UA - MICROSCOPIC ONLY

## 2012-11-08 NOTE — Patient Instructions (Addendum)
Thanks for coming in to see me today. Because you are still having light post-menopausal bleeding, I will refer you to [facility] to discuss endometrial biopsy. If urine results are abnormal, I will call you and send Rx to your pharmacy. We will call you with time and date of your appointment at [facility].  Postmenopausal Bleeding Menopause is commonly referred to as the "change in life." It is a time when the fertile years, the time of ovulating and having menstrual periods, has come to an end. It is also determined by not having menstrual periods for 12 months.  Postmenopausal bleeding is any bleeding a woman has after she has entered into menopause. Any type of postmenopausal bleeding, even if it appears to be a typical menstrual period, is concerning. This should be evaluated by your caregiver.  CAUSES   Hormone therapy.  Cancer of the cervix or cancer of the lining of the uterus (endometrial cancer).  Thinning of the uterine lining (uterine atrophy).  Thyroid diseases.  Certain medicines.  Infection of the uterus or cervix.  Inflammation or irritation of the uterine lining (endometritis).  Estrogen-secreting tumors.  Growths (polyps) on the cervix, uterine lining, or uterus.  Uterine tumors (fibroids).  Being very overweight (obese). DIAGNOSIS  Your caregiver will take a medical history and ask questions. A physical exam will also be performed. Further tests may include:   A transvaginal ultrasound. An ultrasound wand or probe is inserted into your vagina to view the pelvic organs.  A biopsy of the lining of the uterus (endometrium). A sample of the endometrium is removed and examined.  A hysteroscopy. Your caregiver may use an instrument with a light and a camera attached to it (hysteroscope). The hysteroscope is used to look inside the uterus for problems.  A dilation and curettage (D&C). Tissue is removed from the uterine lining to be examined for  problems. TREATMENT  Treatment depends on the cause of the bleeding. Some treatments include:   Surgery.  Medicines.  Hormones.  A hysteroscopy or D&C to remove polyps or fibroids.  Changing or stopping a current medicine you are taking. Talk to your caregiver about your specific treatment. HOME CARE INSTRUCTIONS   Maintain a healthy weight.  Keep regular pelvic exams and Pap tests. SEEK MEDICAL CARE IF:   You have bleeding, even if it is light in comparison to your previous periods.  Your bleeding lasts more than 1 week.  You have abdominal pain.  You develop bleeding with sexual intercourse. SEEK IMMEDIATE MEDICAL CARE IF:   You have a fever, chills, headache, dizziness, muscle aches, and bleeding.  You have severe pain with bleeding.  You are passing blood clots.  You have bleeding and need more than 1 pad an hour.  You feel faint. MAKE SURE YOU:  Understand these instructions.  Will watch your condition.  Will get help right away if you are not doing well or get worse. Document Released: 03/23/2006 Document Revised: 03/06/2012 Document Reviewed: 08/19/2011 [facility] Patient Information 2013 [place], [place].

## 2012-11-08 NOTE — Progress Notes (Signed)
  Subjective:    Patient ID: [name], female    DOB: [birth year], 61 y.o.   MRN: [id number]  HPI  Patient returns to clinic to discuss post-menopausal bleeding and results of pelvic U/S.  Patient continues to have light bleeding only after she wipes.  She has had an endometrial biopsy in 2010 that revealed benign endometrial polyps.  Most recent U/S revealed an endometrial stripe 6 mm.  She is agreeable to another endometrial biopsy but wants "to be put to sleep this time" because pain was unbearable.  Patient denies any pelvic pain or cramping, dysuria, vaginal discharge.  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  She never took Macrobid.  Additionally, patient is on coumadin for Atrial fibrillation and has stopped taking it due to vaginal bleeding.  At the last visit, I encouraged patient to take coumadin, but she and her husband think it makes vaginal bleeding worse.  She has an INR check with cardiology tomorrow.   Review of Systems  Per HPI    Objective:   Physical Exam  Constitutional: She appears well-nourished. No distress.  Abdominal: Soft. Bowel sounds are normal. She exhibits no distension. There is no tenderness. There is no rebound and no guarding.       Assessment & Plan:

## 2012-11-08 NOTE — Assessment & Plan Note (Signed)
Pelvic U/S revealed a thickened 6 mm endometrium. - Referral for GYN has been placed for possible endometrial biopsy - Will repeat UA today, last UA was not a clean catch - Notify patient of results - Follow up GYN recommendations

## 2012-11-09 ENCOUNTER — Ambulatory Visit (INDEPENDENT_AMBULATORY_CARE_PROVIDER_SITE_OTHER): Payer: 59 | Admitting: *Deleted

## 2012-11-09 ENCOUNTER — Ambulatory Visit (INDEPENDENT_AMBULATORY_CARE_PROVIDER_SITE_OTHER): Payer: 59 | Admitting: Cardiology

## 2012-11-09 ENCOUNTER — Encounter: Payer: Self-pay | Admitting: Cardiology

## 2012-11-09 VITALS — BP 138/77 | HR 57 | Ht 63.0 in | Wt 192.0 lb

## 2012-11-09 DIAGNOSIS — Z7901 Long term (current) use of anticoagulants: Secondary | ICD-10-CM

## 2012-11-09 DIAGNOSIS — I4891 Unspecified atrial fibrillation: Secondary | ICD-10-CM

## 2012-11-09 DIAGNOSIS — I2510 Atherosclerotic heart disease of native coronary artery without angina pectoris: Secondary | ICD-10-CM

## 2012-11-09 DIAGNOSIS — E669 Obesity, unspecified: Secondary | ICD-10-CM

## 2012-11-09 LAB — POCT INR: INR: 1.1

## 2012-11-09 MED ORDER — ASPIRIN EC 81 MG PO TBEC: 81.0000 mg | DELAYED_RELEASE_TABLET | Freq: Every day | ORAL | Status: DC

## 2012-11-09 NOTE — Patient Instructions (Addendum)
STOP Coumadin.  Start Aspirin 81mg  daily.  Your physician wants you to follow-up in: 6 months with Dr. Antoine Poche.  You will receive a reminder letter in the mail two months in advance. If you don't receive a letter, please call our office to schedule the follow-up appointment.

## 2012-11-09 NOTE — Progress Notes (Signed)
   HPI The patient presents for evaluation of atrial fibrillation.  Since I last saw her she has had no further symptomatic atrial fibrillation. She denies any presyncope or syncope. She has had no chest pressure, neck or arm discomfort. She walks for exercise. Unfortunately she has had some vaginal bleeding and is being evaluated for this. She thinks she might need some procedure for management of this. She actually has been taken off of the warfarin.  Allergies  Allergen Reactions  . Penicillins Itching  . Percocet (Oxycodone-Acetaminophen) Itching   Prior to Admission medications   Medication Sig Start Date End Date Taking? Authorizing Provider  atenolol (TENORMIN) 50 MG tablet Take 0.5 tablets (25 mg total) by mouth daily. 09/06/12  Yes Beatrice Lecher, PA  diltiazem (CARDIZEM CD) 120 MG 24 hr capsule Take 1 capsule (120 mg total) by mouth daily. 08/19/12 08/19/13 Yes Raeford Razor, MD  levothyroxine (SYNTHROID, LEVOTHROID) 125 MCG tablet Take 125 mcg by mouth daily.   Yes Historical Provider, MD  loratadine (CLARITIN) 10 MG tablet Take 10 mg by mouth daily as needed.    Yes Historical Provider, MD  rosuvastatin (CRESTOR) 40 MG tablet Take 1 tablet (40 mg total) by mouth daily. 04/19/12 04/19/13 Yes Ivy de Lawson Radar, DO    Past Medical History  Diagnosis Date  . Hypertension   . Atrial fibrillation     Echo 5/11:  EF 65%, NL diast fxn  . CAD (coronary artery disease)     a. reported hx of PCI in 2000 (?records);  b.  Myoview 5/10:  EF 71%, no ischemia  . HLD (hyperlipidemia)   . Hypothyroidism      Past Surgical History  Procedure Date  . Joint replacement   . Tubal ligation     ROS:  As stated in the HPI and negative for all other systems.  PHYSICAL EXAM BP 138/77  Pulse 57  Ht 5\' 3"  (1.6 m)  Wt 192 lb (87.091 kg)  BMI 34.01 kg/m2 GENERAL:  Well appearing HEENT:  Pupils equal round and reactive, fundi not visualized, oral mucosa unremarkable NECK:  No jugular venous  distention, waveform within normal limits, carotid upstroke brisk and symmetric, no bruits, no thyromegaly LUNGS:  Clear to auscultation bilaterally BACK:  No CVA tenderness CHEST:  Unremarkable HEART:  PMI not displaced or sustained,S1 and S2 within normal limits, no S3, no S4, no clicks, no rubs, no murmurs ABD:  Flat, positive bowel sounds normal in frequency in pitch, no bruits, no rebound, no guarding, no midline pulsatile mass, no hepatomegaly, no splenomegaly EXT:  2 plus pulses throughout, no edema, no cyanosis no clubbing  EKG:   Sinus rhythm, rate 57, axis within normal limits, intervals within normal limits, no acute ST-T wave changes. 11/09/2012   ASSESSMENT AND PLAN  ATRIAL FIBRILLATION -  Given the vaginal bleeding she will remain off warfarin for now. We will reconsider this in the future. Given her coronary disease she will start back her aspirin.  ESSENTIAL HYPERTENSION, BENIGN -  The blood pressure is at target. No change in medications is indicated. We will continue with therapeutic lifestyle changes (TLC).   CAD - She has no active ischemia and no symptoms of perfusion study in 2010. Continue with risk reduction.

## 2012-11-20 ENCOUNTER — Ambulatory Visit: Payer: Self-pay | Admitting: Cardiovascular Disease

## 2012-11-20 DIAGNOSIS — Z7901 Long term (current) use of anticoagulants: Secondary | ICD-10-CM

## 2012-11-20 DIAGNOSIS — I4891 Unspecified atrial fibrillation: Secondary | ICD-10-CM

## 2012-11-30 ENCOUNTER — Encounter (HOSPITAL_COMMUNITY): Payer: Self-pay | Admitting: Pharmacist

## 2012-12-01 ENCOUNTER — Other Ambulatory Visit: Payer: Self-pay | Admitting: Obstetrics & Gynecology

## 2012-12-04 ENCOUNTER — Encounter (HOSPITAL_COMMUNITY)
Admission: RE | Admit: 2012-12-04 | Discharge: 2012-12-04 | Disposition: A | Discharge status: Home / Self Care | Payer: 59 | Source: Ambulatory Visit | Attending: Obstetrics & Gynecology | Admitting: Obstetrics & Gynecology

## 2012-12-04 ENCOUNTER — Encounter (HOSPITAL_COMMUNITY): Payer: Self-pay

## 2012-12-04 HISTORY — DX: Unspecified osteoarthritis, unspecified site: M19.90

## 2012-12-04 LAB — CBC
HCT: 37.3 % (ref 36.0–46.0)
Hemoglobin: 12.5 g/dL (ref 12.0–15.0)
MCH: 29.4 pg (ref 26.0–34.0)
MCHC: 33.5 g/dL (ref 30.0–36.0)
MCV: 87.8 fL (ref 78.0–100.0)
Platelets: 219 10*3/uL (ref 150–400)
RBC: 4.25 MIL/uL (ref 3.87–5.11)
RDW: 12.9 % (ref 11.5–15.5)
WBC: 7.7 10*3/uL (ref 4.0–10.5)

## 2012-12-04 LAB — BASIC METABOLIC PANEL
BUN: 13 mg/dL (ref 6–23)
CO2: 24 mEq/L (ref 19–32)
Calcium: 9.2 mg/dL (ref 8.4–10.5)
Chloride: 103 mEq/L (ref 96–112)
Creatinine, Ser: 0.86 mg/dL (ref 0.50–1.10)
GFR calc Af Amer: 82 mL/min — ABNORMAL LOW (ref >90)
GFR calc non Af Amer: 70 mL/min — ABNORMAL LOW (ref >90)
Glucose, Bld: 130 mg/dL — ABNORMAL HIGH (ref 70–99)
Potassium: 3.9 mEq/L (ref 3.5–5.1)
Sodium: 139 mEq/L (ref 135–145)

## 2012-12-04 NOTE — Patient Instructions (Addendum)
   Your procedure is scheduled on: Thursday, Dec 12  Enter through the [employer] of [facility] at:  1 pm Pick up the phone at the desk and dial [phone] and inform us of your arrival.  Please call this number if you have any problems the morning of surgery: [phone]  Remember: Do not eat food after midnight: Wednesday Do not drink clear liquids after: 1030am Thursday Take these medicines the morning of surgery with a SIP OF WATER: crestor, atenolol, cardizem, synthroid  Do not wear jewelry, make-up, or FINGER nail polish No metal in your hair or on your body. Do not wear lotions, powders, perfumes. You may wear deodorant.  Please use your CHG wash as directed prior to surgery.  Do not shave anywhere for at least 12 hours prior to first CHG shower.  Do not bring valuables to the hospital. Contacts, dentures or bridgework may not be worn into surgery.  Patients discharged on the day of surgery will not be allowed to drive home.  Home with husband Renae Fickle.

## 2012-12-07 ENCOUNTER — Encounter (HOSPITAL_COMMUNITY): Payer: Self-pay | Admitting: Anesthesiology

## 2012-12-07 ENCOUNTER — Ambulatory Visit (HOSPITAL_COMMUNITY)
Admission: RE | Admit: 2012-12-07 | Discharge: 2012-12-07 | Disposition: A | Discharge status: Home / Self Care | Payer: 59 | Source: Ambulatory Visit | Attending: Obstetrics & Gynecology | Admitting: Obstetrics & Gynecology

## 2012-12-07 ENCOUNTER — Encounter (HOSPITAL_COMMUNITY): Payer: Self-pay | Admitting: *Deleted

## 2012-12-07 ENCOUNTER — Ambulatory Visit (HOSPITAL_COMMUNITY): Payer: 59 | Admitting: Anesthesiology

## 2012-12-07 ENCOUNTER — Encounter (HOSPITAL_COMMUNITY)
Admission: RE | Disposition: A | Discharge status: Home / Self Care | Payer: Self-pay | Source: Ambulatory Visit | Attending: Obstetrics & Gynecology

## 2012-12-07 DIAGNOSIS — N3946 Mixed incontinence: Secondary | ICD-10-CM

## 2012-12-07 DIAGNOSIS — L309 Dermatitis, unspecified: Secondary | ICD-10-CM

## 2012-12-07 DIAGNOSIS — E785 Hyperlipidemia, unspecified: Secondary | ICD-10-CM

## 2012-12-07 DIAGNOSIS — N950 Postmenopausal bleeding: Secondary | ICD-10-CM | POA: Diagnosis present

## 2012-12-07 DIAGNOSIS — F329 Major depressive disorder, single episode, unspecified: Secondary | ICD-10-CM

## 2012-12-07 DIAGNOSIS — K635 Polyp of colon: Secondary | ICD-10-CM

## 2012-12-07 DIAGNOSIS — M25559 Pain in unspecified hip: Secondary | ICD-10-CM

## 2012-12-07 DIAGNOSIS — H6120 Impacted cerumen, unspecified ear: Secondary | ICD-10-CM

## 2012-12-07 DIAGNOSIS — J069 Acute upper respiratory infection, unspecified: Secondary | ICD-10-CM

## 2012-12-07 DIAGNOSIS — N882 Stricture and stenosis of cervix uteri: Secondary | ICD-10-CM | POA: Insufficient documentation

## 2012-12-07 DIAGNOSIS — R9389 Abnormal findings on diagnostic imaging of other specified body structures: Secondary | ICD-10-CM | POA: Insufficient documentation

## 2012-12-07 DIAGNOSIS — R21 Rash and other nonspecific skin eruption: Secondary | ICD-10-CM

## 2012-12-07 DIAGNOSIS — Z0000 Encounter for general adult medical examination without abnormal findings: Secondary | ICD-10-CM

## 2012-12-07 DIAGNOSIS — M169 Osteoarthritis of hip, unspecified: Secondary | ICD-10-CM

## 2012-12-07 DIAGNOSIS — N840 Polyp of corpus uteri: Secondary | ICD-10-CM

## 2012-12-07 DIAGNOSIS — Z01818 Encounter for other preprocedural examination: Secondary | ICD-10-CM | POA: Insufficient documentation

## 2012-12-07 DIAGNOSIS — E039 Hypothyroidism, unspecified: Secondary | ICD-10-CM

## 2012-12-07 DIAGNOSIS — M25531 Pain in right wrist: Secondary | ICD-10-CM

## 2012-12-07 DIAGNOSIS — I10 Essential (primary) hypertension: Secondary | ICD-10-CM

## 2012-12-07 DIAGNOSIS — R7301 Impaired fasting glucose: Secondary | ICD-10-CM

## 2012-12-07 DIAGNOSIS — I2510 Atherosclerotic heart disease of native coronary artery without angina pectoris: Secondary | ICD-10-CM | POA: Insufficient documentation

## 2012-12-07 DIAGNOSIS — Z01812 Encounter for preprocedural laboratory examination: Secondary | ICD-10-CM | POA: Insufficient documentation

## 2012-12-07 DIAGNOSIS — I252 Old myocardial infarction: Secondary | ICD-10-CM

## 2012-12-07 DIAGNOSIS — I4891 Unspecified atrial fibrillation: Secondary | ICD-10-CM | POA: Insufficient documentation

## 2012-12-07 DIAGNOSIS — E669 Obesity, unspecified: Secondary | ICD-10-CM

## 2012-12-07 HISTORY — PX: HYSTEROSCOPY WITH D & C: SHX1775

## 2012-12-07 SURGERY — DILATATION AND CURETTAGE /HYSTEROSCOPY
Anesthesia: General | Site: Uterus | Wound class: Clean Contaminated

## 2012-12-07 MED ORDER — FENTANYL CITRATE 0.05 MG/ML IJ SOLN
INTRAMUSCULAR | Status: DC | PRN
  Administered 2012-12-07 (×2): 50 ug via INTRAVENOUS

## 2012-12-07 MED ORDER — ONDANSETRON HCL 4 MG/2ML IJ SOLN
INTRAMUSCULAR | Status: AC
  Filled 2012-12-07: qty 2

## 2012-12-07 MED ORDER — PHENYLEPHRINE 40 MCG/ML (10ML) SYRINGE FOR IV PUSH (FOR BLOOD PRESSURE SUPPORT)
PREFILLED_SYRINGE | INTRAVENOUS | Status: AC
  Filled 2012-12-07: qty 5

## 2012-12-07 MED ORDER — EPHEDRINE 5 MG/ML INJ
INTRAVENOUS | Status: AC
  Filled 2012-12-07: qty 10

## 2012-12-07 MED ORDER — LACTATED RINGERS IV SOLN
INTRAVENOUS | Status: DC
  Administered 2012-12-07: 125 mL/h via INTRAVENOUS
  Administered 2012-12-07: 15:00:00 via INTRAVENOUS

## 2012-12-07 MED ORDER — GLYCINE 1.5 % IR SOLN
Status: DC | PRN
  Administered 2012-12-07: 3000 mL

## 2012-12-07 MED ORDER — MEPERIDINE HCL 25 MG/ML IJ SOLN: 6.2500 mg | INTRAMUSCULAR | Status: DC | PRN

## 2012-12-07 MED ORDER — LIDOCAINE HCL (CARDIAC) 20 MG/ML IV SOLN
INTRAVENOUS | Status: DC | PRN
  Administered 2012-12-07: 60 mg via INTRAVENOUS

## 2012-12-07 MED ORDER — PROPOFOL 10 MG/ML IV EMUL
INTRAVENOUS | Status: DC | PRN
  Administered 2012-12-07: 160 mg via INTRAVENOUS

## 2012-12-07 MED ORDER — GLYCOPYRROLATE 0.2 MG/ML IJ SOLN
INTRAMUSCULAR | Status: AC
  Filled 2012-12-07: qty 1

## 2012-12-07 MED ORDER — EPHEDRINE SULFATE 50 MG/ML IJ SOLN
INTRAMUSCULAR | Status: DC | PRN
  Administered 2012-12-07: 10 mg via INTRAVENOUS

## 2012-12-07 MED ORDER — MIDAZOLAM HCL 2 MG/2ML IJ SOLN: 0.5000 mg | Freq: Once | INTRAMUSCULAR | Status: DC | PRN

## 2012-12-07 MED ORDER — PROMETHAZINE HCL 25 MG/ML IJ SOLN: 6.2500 mg | INTRAMUSCULAR | Status: DC | PRN

## 2012-12-07 MED ORDER — IBUPROFEN 200 MG PO TABS: 400.0000 mg | ORAL_TABLET | Freq: Three times a day (TID) | ORAL | Status: DC | PRN

## 2012-12-07 MED ORDER — FENTANYL CITRATE 0.05 MG/ML IJ SOLN
INTRAMUSCULAR | Status: AC
  Filled 2012-12-07: qty 2

## 2012-12-07 MED ORDER — FENTANYL CITRATE 0.05 MG/ML IJ SOLN: 25.0000 ug | INTRAMUSCULAR | Status: DC | PRN

## 2012-12-07 MED ORDER — LIDOCAINE HCL 1 % IJ SOLN
INTRAMUSCULAR | Status: DC | PRN
  Administered 2012-12-07: 10 mL

## 2012-12-07 MED ORDER — MIDAZOLAM HCL 5 MG/5ML IJ SOLN
INTRAMUSCULAR | Status: DC | PRN
  Administered 2012-12-07: 1 mg via INTRAVENOUS

## 2012-12-07 MED ORDER — DOXYCYCLINE HYCLATE 100 MG IV SOLR
100.0000 mg | Freq: Two times a day (BID) | INTRAVENOUS | Status: DC
  Administered 2012-12-07: 100 mg via INTRAVENOUS
  Filled 2012-12-07 (×5): qty 100

## 2012-12-07 MED ORDER — PROPOFOL 10 MG/ML IV EMUL
INTRAVENOUS | Status: AC
  Filled 2012-12-07: qty 20

## 2012-12-07 MED ORDER — ONDANSETRON HCL 4 MG/2ML IJ SOLN
INTRAMUSCULAR | Status: DC | PRN
  Administered 2012-12-07: 4 mg via INTRAVENOUS

## 2012-12-07 MED ORDER — MIDAZOLAM HCL 2 MG/2ML IJ SOLN
INTRAMUSCULAR | Status: AC
  Filled 2012-12-07: qty 2

## 2012-12-07 MED ORDER — LIDOCAINE HCL (CARDIAC) 20 MG/ML IV SOLN
INTRAVENOUS | Status: AC
  Filled 2012-12-07: qty 5

## 2012-12-07 SURGICAL SUPPLY — 19 items
CANISTER SUCTION 2500CC (MISCELLANEOUS) ×2 IMPLANT
CATH ROBINSON RED A/P 16FR (CATHETERS) ×2 IMPLANT
CLOTH BEACON ORANGE TIMEOUT ST (SAFETY) ×2 IMPLANT
CONTAINER PREFILL 10% NBF 60ML (FORM) ×3 IMPLANT
DECANTER SPIKE VIAL GLASS SM (MISCELLANEOUS) ×1 IMPLANT
DRESSING TELFA 8X3 (GAUZE/BANDAGES/DRESSINGS) ×2 IMPLANT
ELECT REM PT RETURN 9FT ADLT (ELECTROSURGICAL) ×2
ELECTRODE REM PT RTRN 9FT ADLT (ELECTROSURGICAL) ×1 IMPLANT
ELECTRODE ROLLER VERSAPOINT (ELECTRODE) IMPLANT
ELECTRODE RT ANGLE VERSAPOINT (CUTTING LOOP) IMPLANT
GLOVE BIO SURGEON STRL SZ7 (GLOVE) ×2 IMPLANT
GLOVE BIOGEL PI IND STRL 7.0 (GLOVE) ×2 IMPLANT
GLOVE BIOGEL PI INDICATOR 7.0 (GLOVE) ×2
GOWN STRL REIN XL XLG (GOWN DISPOSABLE) ×4 IMPLANT
LOOP ANGLED CUTTING 22FR (CUTTING LOOP) IMPLANT
PACK HYSTEROSCOPY LF (CUSTOM PROCEDURE TRAY) ×2 IMPLANT
PAD OB MATERNITY 4.3X12.25 (PERSONAL CARE ITEMS) ×2 IMPLANT
TOWEL OR 17X24 6PK STRL BLUE (TOWEL DISPOSABLE) ×4 IMPLANT
WATER STERILE IRR 1000ML POUR (IV SOLUTION) ×2 IMPLANT

## 2012-12-07 NOTE — Anesthesia Preprocedure Evaluation (Signed)
Anesthesia Evaluation  Patient identified by MRN, date of birth, ID band Patient awake    Reviewed: Allergy & Precautions, H&P , Patient's Chart, lab work & pertinent test results, reviewed documented beta blocker date and time   History of Anesthesia Complications Negative for: history of anesthetic complications  Airway Mallampati: II TM Distance: >3 FB Neck ROM: full    Dental No notable dental hx.    Pulmonary neg pulmonary ROS,  breath sounds clear to auscultation  Pulmonary exam normal       Cardiovascular Exercise Tolerance: Good hypertension, + CAD and + Past MI negative cardio ROS  + dysrhythmias Rhythm:regular Rate:Normal     Neuro/Psych PSYCHIATRIC DISORDERS Depression negative neurological ROS  negative psych ROS   GI/Hepatic negative GI ROS, Neg liver ROS,   Endo/Other  negative endocrine ROSHypothyroidism   Renal/GU negative Renal ROS     Musculoskeletal   Abdominal   Peds  Hematology negative hematology ROS (+)   Anesthesia Other Findings   Reproductive/Obstetrics negative OB ROS                           Anesthesia Physical Anesthesia Plan  ASA: III  Anesthesia Plan: General LMA   Post-op Pain Management:    Induction:   Airway Management Planned:   Additional Equipment:   Intra-op Plan:   Post-operative Plan:   Informed Consent: I have reviewed the patients History and Physical, chart, labs and discussed the procedure including the risks, benefits and alternatives for the proposed anesthesia with the patient or authorized representative who has indicated his/her understanding and acceptance.   Dental Advisory Given  Plan Discussed with: CRNA, Surgeon and Anesthesiologist  Anesthesia Plan Comments:         Anesthesia Quick Evaluation

## 2012-12-07 NOTE — Anesthesia Procedure Notes (Signed)
Procedure Name: LMA Insertion Date/Time: 12/07/2012 3:00 PM Performed by: Graciela Husbands Pre-anesthesia Checklist: Patient identified, Patient being monitored, Emergency Drugs available, Timeout performed and Suction available Patient Re-evaluated:Patient Re-evaluated prior to inductionOxygen Delivery Method: Circle system utilized Preoxygenation: Pre-oxygenation with 100% oxygen Intubation Type: IV induction Ventilation: Mask ventilation without difficulty LMA: LMA inserted LMA Size: 4.0 Number of attempts: 1 Placement Confirmation: positive ETCO2 and breath sounds checked- equal and bilateral Tube secured with: Tape Dental Injury: Teeth and Oropharynx as per pre-operative assessment

## 2012-12-07 NOTE — Anesthesia Postprocedure Evaluation (Signed)
Anesthesia Post Note  Patient: [name]  Procedure(s) Performed: Procedure(s) (LRB): DILATATION AND CURETTAGE /HYSTEROSCOPY (N/A)  Anesthesia type: GA  Patient location: PACU  Post pain: Pain level controlled  Post assessment: Post-op Vital signs reviewed  Last Vitals:  Filed Vitals:   12/07/12 1700  BP: 118/62  Pulse: 46  Temp: 36.7 C  Resp: 18    Post vital signs: Reviewed  Level of consciousness: sedated  Complications: No apparent anesthesia complications

## 2012-12-07 NOTE — Transfer of Care (Signed)
Immediate Anesthesia Transfer of Care Note  Patient: [name]  Procedure(s) Performed: Procedure(s) (LRB) with comments: DILATATION AND CURETTAGE /HYSTEROSCOPY (N/A)  Patient Location: PACU  Anesthesia Type:General  Level of Consciousness: awake, alert  and oriented  Airway & Oxygen Therapy: Patient Spontanous Breathing and Patient connected to nasal cannula oxygen  Post-op Assessment: Report given to PACU RN and Post -op Vital signs reviewed and stable  Post vital signs: Reviewed and stable  Complications: No apparent anesthesia complications

## 2012-12-07 NOTE — Op Note (Signed)
Preoperative diagnosis: Postmenopausal bleeding, thick endometrial stripe at 6 mm  Postop diagnosis: as above.  Procedure: Hysteroscopic endometrial biopsy and D&C Anesthesia General via LMA  Surgeon: Shea Evans, MD  Assistant: None IV fluids : 1100 cc since arriving to pre-op Estimated blood loss : 10 cc  Urine output: straight catheter preop 200 cc, clear   Complications none  Condition stable  Disposition PACU  Specimen: Endometrial biopsy and endometrial curettings   Procedure  Indication: Recurrent postmenopausal bleeding with endometrial stripe measuring 6 mm. Office endometrial biopsy not tolerated due to cervical stenosis and cervix flushed with vaginal wall. She was counseled on proceeding with surgery and risks/ complications including infection, bleeding, damage to internal organs, she understood and agrees, gave informed written consent.  Patient was brought to the operating room with IV running. Time out was carried out. She underwent general anesthesia via LMA without complications. She was given dorsolithotomy position. Parts were prepped and draped in standard fashion. Bladder was catheterized once. Bimanual exam revealed uterus to be anteverted and normal size. Speculum was placed and cervix was grasped with single-tooth tenaculum with some difficulty since it was noted to be flushed with vaginal wall due to menopausal atrophy. The uterus was sounded to 7 cm. Cervical os was noted to be dilated at 15 F due to pre-op Cytotec use and didn't need further dilatation. Hysteroscope was introduced in the uterine cavity under vision, using Glycine for irrigation. Findings: A 5 mm thickened area noted in the lower segment on posterior wall. Fundus and both ostii seen well and appeared normal and endometrial cavity appeared atrophic.  Hysteroscopic biopsy was obtained of the thickened area from lower segment and specimen sent to path. Hysteroscope was removed. Endometrial curettage was  performed with sharp curette. All tissue sent to path.  Fluid deficit none.  All counts are correct x2. No complications. Patient was made supine dorsal anesthesia and brought to the recovery room in stable condition.  Patient will be discharged home today. Follow up in 2 weeks in office. Warning signs of infection and excessive bleeding reviewed.   V.Kavitha Lansdale, MD.

## 2012-12-07 NOTE — H&P (Signed)
[name] is an 61 y.o. female with postmenopausal bleeding, here for D&C, hysteroscopy since failed attempt to get office EBX due to cervical stenosis.  No HT use. Prior negative D&C for postmenopausal bleeding  No LMP recorded. Patient is postmenopausal.    Past Medical History  Diagnosis Date  . Hypertension   . CAD (coronary artery disease)     a. reported hx of PCI in 2000 (?records);  b.  Myoview 5/10:  EF 71%, no ischemia  . HLD (hyperlipidemia)   . Hypothyroidism   . SVD (spontaneous vaginal delivery)     x 5  . Atrial fibrillation     Echo 5/11:  EF 65%, NL diast fxn  . Arthritis     wrist, hands - tx with otc meds    Past Surgical History  Procedure Date  . Tubal ligation   . Dilation and curettage of uterus     hysteroscopy  . Joint replacement     left hip     Family History  Problem Relation Age of Onset  . Diabetes Mother   . Heart disease Mother   . Heart disease Father   . Cancer Brother     intestinal  . Cancer Brother     throat ca  . Cancer Brother     bladder    Social History:  reports that she quit smoking about 31 years ago. Her smoking use included Cigarettes. She has a 22.5 pack-year smoking history. She has never used smokeless tobacco. She reports that she drinks about 1.8 ounces of alcohol per week. She reports that she does not use illicit drugs.  Allergies:  Allergies  Allergen Reactions  . Penicillins Itching  . Percocet (Oxycodone-Acetaminophen) Itching    Prescriptions prior to admission  Medication Sig Dispense Refill  . aspirin EC 81 MG tablet Take 1 tablet (81 mg total) by mouth daily.  90 tablet  3  . atenolol (TENORMIN) 50 MG tablet Take 0.5 tablets (25 mg total) by mouth daily.      Marland Kitchen diltiazem (CARDIZEM CD) 120 MG 24 hr capsule Take 1 capsule (120 mg total) by mouth daily.  30 capsule  0  . levothyroxine (SYNTHROID, LEVOTHROID) 125 MCG tablet Take 125 mcg by mouth daily.      . rosuvastatin (CRESTOR) 40 MG tablet  Take 1 tablet (40 mg total) by mouth daily.  90 tablet  3    ROS neg CP/SOB/LE pain or swelling  Blood pressure 130/78, pulse 48, temperature 98.1 F (36.7 C), temperature source Oral, resp. rate 18, SpO2 97.00%. Physical Exam A&O x 3, no acute distress. Pleasant HEENT neg, no thyromegaly Lungs CTA bilat CV RRR, S1S2 normal Abdo soft, non tender, non acute Extr no edema/ tenderness Pelvic Cervical stenosis, normal uterus and normal adnexa.   Assessment/Plan: Postmenopausal bleeding, cervical stenosis, here for D&C, Hysteroscopy.   Risks/complications of surgery reviewed incl infection, bleeding, damage to internal organs including bladder, bowels, ureters, blood vessels, other risks from anesthesia, VTE and delayed complications of any surgery, complications in future surgery reviewed. Patient understands and agrees.    Naylah Cork R 12/07/2012, 1:09 PM

## 2012-12-08 ENCOUNTER — Encounter (HOSPITAL_COMMUNITY): Payer: Self-pay | Admitting: Obstetrics & Gynecology

## 2012-12-15 ENCOUNTER — Encounter: Payer: Self-pay | Admitting: Family Medicine

## 2012-12-15 DIAGNOSIS — N840 Polyp of corpus uteri: Secondary | ICD-10-CM

## 2012-12-28 ENCOUNTER — Encounter: Payer: Self-pay | Admitting: Family Medicine

## 2012-12-28 ENCOUNTER — Ambulatory Visit (INDEPENDENT_AMBULATORY_CARE_PROVIDER_SITE_OTHER): Payer: 59 | Admitting: Family Medicine

## 2012-12-28 ENCOUNTER — Telehealth: Payer: Self-pay | Admitting: Family Medicine

## 2012-12-28 VITALS — BP 129/64 | HR 69 | Temp 99.2°F | Ht 63.0 in | Wt 189.4 lb

## 2012-12-28 DIAGNOSIS — J069 Acute upper respiratory infection, unspecified: Secondary | ICD-10-CM

## 2012-12-28 DIAGNOSIS — N840 Polyp of corpus uteri: Secondary | ICD-10-CM

## 2012-12-28 MED ORDER — BENZONATATE 200 MG PO CAPS: 200.0000 mg | ORAL_CAPSULE | Freq: Three times a day (TID) | ORAL | Status: DC | PRN

## 2012-12-28 NOTE — Progress Notes (Signed)
  Subjective:     [name] is a 62 y.o. female who presents for evaluation of sore throat. Associated symptoms include dry cough, hoarseness, rhinorrhea, nasal blockage, sinus and nasal congestion and sore throat. Onset of symptoms was 4 days ago, and have been unchanged since that time. She is drinking plenty of fluids. She has not had a recent close exposure to someone with proven streptococcal pharyngitis.  She had a flu shot this year. Using tylenol with some improvement symptoms.  The following portions of the patient's history were reviewed and updated as appropriate: allergies, current medications, past family history, past medical history, past social history, past surgical history and problem list.  Review of Systems Pertinent items are noted in HPI.  denies chest pain, dyspnea, wheezing, fever, myalgia, headache.   Objective:    BP 129/64  Pulse 69  Temp 99.2 F (37.3 C) (Oral)  Ht 5\' 3"  (1.6 m)  Wt 189 lb 6.4 oz (85.911 kg)  BMI 33.55 kg/m2 General appearance: alert, cooperative, appears stated age and no distress Head: Normocephalic, without obvious abnormality, atraumatic Eyes: negative findings: conjunctivae and sclerae normal and pupils equal, round, reactive to light and accomodation Ears: normal TM's and external ear canals both ears Nose: clear discharge, moderate congestion, no sinus tenderness Throat: lips, mucosa, and tongue normal; teeth and gums normal Lungs: clear to auscultation bilaterally Heart: regular rate and rhythm, S1, S2 normal, no murmur, click, rub or gallop Extremities: extremities normal, atraumatic, no cyanosis or edema      Assessment:    Acute upper respiratory viral syndrome, likely  Viral pharyngitis.    Plan:    Use of OTC analgesics recommended as well as salt water gargles. Use of decongestant recommended. Tessalon prn cough. Follow up in 5 days if not improving.

## 2012-12-28 NOTE — Telephone Encounter (Signed)
Status post hysteroscopic polyp resection per Dr. Juliene Pina.  Path benign.  Problem List updated.

## 2012-12-28 NOTE — Patient Instructions (Signed)
You seem to have a viral URI or cold. Treatment of symptoms is best.  Try tessalon for cough. You may use OTC decongestant or robitussin for short term. If you aren't getting better next 3-4 days, return to clinic. If you have shortness of breath or worsened fever, call MD.  Upper Respiratory Infection, Adult An upper respiratory infection (URI) is also known as the common cold. It is often caused by a type of germ (virus). Colds are easily spread (contagious). You can pass it to others by kissing, coughing, sneezing, or drinking out of the same glass. Usually, you get better in 1 or 2 weeks.  HOME CARE   Only take medicine as told by your doctor.  Use a warm mist humidifier or breathe in steam from a hot shower.  Drink enough water and fluids to keep your pee (urine) clear or pale yellow.  Get plenty of rest.  Return to work when your temperature is back to normal or as told by your doctor. You may use a face mask and wash your hands to stop your cold from spreading. GET HELP RIGHT AWAY IF:   After the first few days, you feel you are getting worse.  You have questions about your medicine.  You have chills, shortness of breath, or brown or red spit (mucus).  You have yellow or brown snot (nasal discharge) or pain in the face, especially when you bend forward.  You have a fever, puffy (swollen) neck, pain when you swallow, or white spots in the back of your throat.  You have a bad headache, ear pain, sinus pain, or chest pain.  You have a high-pitched whistling sound when you breathe in and out (wheezing).  You have a lasting cough or cough up blood.  You have sore muscles or a stiff neck. MAKE SURE YOU:   Understand these instructions.  Will watch your condition.  Will get help right away if you are not doing well or get worse. Document Released: 05/31/2008 Document Revised: 03/06/2012 Document Reviewed: 04/19/2011 [facility] Patient Information 2013 [place], [place].

## 2013-02-17 ENCOUNTER — Other Ambulatory Visit: Payer: Self-pay | Admitting: Family Medicine

## 2013-02-20 ENCOUNTER — Other Ambulatory Visit: Payer: Self-pay | Admitting: Family Medicine

## 2013-03-16 ENCOUNTER — Ambulatory Visit (INDEPENDENT_AMBULATORY_CARE_PROVIDER_SITE_OTHER): Payer: 59 | Admitting: Family Medicine

## 2013-03-16 ENCOUNTER — Other Ambulatory Visit (HOSPITAL_COMMUNITY)
Admission: RE | Admit: 2013-03-16 | Discharge: 2013-03-16 | Disposition: A | Discharge status: Home / Self Care | Payer: 59 | Source: Ambulatory Visit | Attending: Family Medicine | Admitting: Family Medicine

## 2013-03-16 ENCOUNTER — Encounter: Payer: Self-pay | Admitting: Family Medicine

## 2013-03-16 VITALS — BP 129/61 | HR 65 | Temp 98.6°F | Ht 63.0 in | Wt 186.8 lb

## 2013-03-16 DIAGNOSIS — I252 Old myocardial infarction: Secondary | ICD-10-CM

## 2013-03-16 DIAGNOSIS — Z124 Encounter for screening for malignant neoplasm of cervix: Secondary | ICD-10-CM

## 2013-03-16 DIAGNOSIS — Z1211 Encounter for screening for malignant neoplasm of colon: Secondary | ICD-10-CM

## 2013-03-16 DIAGNOSIS — E039 Hypothyroidism, unspecified: Secondary | ICD-10-CM

## 2013-03-16 DIAGNOSIS — Z0000 Encounter for general adult medical examination without abnormal findings: Secondary | ICD-10-CM

## 2013-03-16 DIAGNOSIS — Z1151 Encounter for screening for human papillomavirus (HPV): Secondary | ICD-10-CM | POA: Insufficient documentation

## 2013-03-16 DIAGNOSIS — N760 Acute vaginitis: Secondary | ICD-10-CM

## 2013-03-16 DIAGNOSIS — Z01419 Encounter for gynecological examination (general) (routine) without abnormal findings: Secondary | ICD-10-CM | POA: Insufficient documentation

## 2013-03-16 DIAGNOSIS — E785 Hyperlipidemia, unspecified: Secondary | ICD-10-CM

## 2013-03-16 LAB — LIPID PANEL
Cholesterol: 232 mg/dL — ABNORMAL HIGH (ref 0–200)
HDL: 54 mg/dL (ref >39)
LDL Cholesterol: 149 mg/dL — ABNORMAL HIGH (ref 0–99)
Total CHOL/HDL Ratio: 4.3 Ratio
Triglycerides: 145 mg/dL (ref <150)
VLDL: 29 mg/dL (ref 0–40)

## 2013-03-16 LAB — TSH: TSH: 1.9 u[IU]/mL (ref 0.350–4.500)

## 2013-03-16 LAB — POCT GLYCOSYLATED HEMOGLOBIN (HGB A1C): Hemoglobin A1C: 6.3

## 2013-03-16 MED ORDER — ATENOLOL 50 MG PO TABS: ORAL_TABLET | ORAL | Status: DC

## 2013-03-16 MED ORDER — ROSUVASTATIN CALCIUM 40 MG PO TABS: 40.0000 mg | ORAL_TABLET | Freq: Every day | ORAL | Status: DC

## 2013-03-16 MED ORDER — DILTIAZEM HCL ER COATED BEADS 120 MG PO CP24: 120.0000 mg | ORAL_CAPSULE | Freq: Every day | ORAL | Status: DC

## 2013-03-16 NOTE — Progress Notes (Signed)
  Subjective:     [name] is a 62 y.o. female and is here for a comprehensive physical exam. The patient reports no problems.  In December 2013, Dr. Juliene Pina performed [facility] for post-menopausal bleeding.  Pathology negative for malignancy.  Bleeding has stopped.  She does complain of vaginal discharge (thin), but does not itch or have a foul odor.   History   Social History  . Marital Status: Married    Spouse Name: N/A    Number of Children: N/A  . Years of Education: N/A   Occupational History  . Not on file.   Social History Main Topics  . Smoking status: Former Smoker -- 1.50 packs/day for 15 years    Types: Cigarettes    Quit date: 11/09/1981  . Smokeless tobacco: Never Used  . Alcohol Use: 1.8 oz/week    3 Cans of beer per week     Comment: weekly  . Drug Use: No  . Sexually Active: Yes    Birth Control/ Protection: Surgical   Other Topics Concern  . Not on file   Social History Narrative   Lives with husband and 63 yo granddaughter.  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  Has 5 adult children all living in the area.   Health Maintenance  Topic Date Due  . Mammogram  03/25/1999  . Colonoscopy  03/25/1999  . Influenza Vaccine  08/27/2013  . Pap Smear  05/05/2014  . Tetanus/tdap  05/05/2021  . Zostavax  Completed    The following portions of the patient's history were reviewed and updated as appropriate: allergies, current medications, past medical history, past social history, past surgical history and problem list.  Review of Systems Pertinent items are noted in HPI.   Objective:    BP 129/61  Pulse 65  Temp(Src) 98.6 F (37 C) (Oral)  Ht 5\' 3"  (1.6 m)  Wt 186 lb 12.8 oz (84.732 kg)  BMI 33.1 kg/m2 General appearance: alert, cooperative and no distress Neck: no adenopathy, supple, symmetrical, trachea midline and thyroid not enlarged, symmetric, no tenderness/mass/nodules Lungs: clear to auscultation bilaterally Heart: regular rate and  rhythm, S1, S2 normal, no murmur, click, rub or gallop Abdomen: soft, non-tender; bowel sounds normal; no masses,  no organomegaly Pelvic: cervix normal in appearance, external genitalia normal, no adnexal masses or tenderness and thin, clear discharge, no bleeding Extremities: extremities normal, atraumatic, no cyanosis or edema Skin: Skin color, texture, turgor normal. No rashes or lesions Neurologic: Grossly normal    Assessment:    Healthy female exam.     Plan:     See After Visit Summary for Counseling Recommendations

## 2013-03-16 NOTE — Assessment & Plan Note (Signed)
On Levothyroxine.  Will check TSH today.

## 2013-03-16 NOTE — Assessment & Plan Note (Signed)
Will repeat lipid panel today.  Continue Crestor.

## 2013-03-16 NOTE — Assessment & Plan Note (Signed)
Pap smear, TSH, and lipid panel performed today.  Will notify of results. Colonoscopy and mammogram ordered, to be scheduled. Follow up in one year for next complete physical.

## 2013-03-16 NOTE — Patient Instructions (Addendum)
We will call you about colonoscopy. You can schedule your mammogram at any time. I will send an letter with results of blood work in about one week. Next annual physical in one year.

## 2013-03-21 ENCOUNTER — Encounter: Payer: Self-pay | Admitting: Family Medicine

## 2013-05-22 ENCOUNTER — Telehealth: Payer: Self-pay | Admitting: Family Medicine

## 2013-05-22 NOTE — Telephone Encounter (Signed)
Returned call to home and work - no answer or identifiable Recruitment consultant. Wyatt Haste, RN-BSN

## 2013-05-22 NOTE — Telephone Encounter (Signed)
Having dizzy spells for the last 3 days and is not sure what to do - needs to talk to nurse

## 2013-05-31 ENCOUNTER — Emergency Department (INDEPENDENT_AMBULATORY_CARE_PROVIDER_SITE_OTHER)
Admission: EM | Admit: 2013-05-31 | Discharge: 2013-05-31 | Disposition: A | Discharge status: Home / Self Care | Payer: Worker's Compensation | Source: Home / Self Care | Attending: Family Medicine | Admitting: Family Medicine

## 2013-05-31 ENCOUNTER — Encounter (HOSPITAL_COMMUNITY): Payer: Self-pay | Admitting: Emergency Medicine

## 2013-05-31 DIAGNOSIS — S63509A Unspecified sprain of unspecified wrist, initial encounter: Secondary | ICD-10-CM

## 2013-05-31 MED ORDER — NAPROXEN 500 MG PO TABS: 500.0000 mg | ORAL_TABLET | Freq: Two times a day (BID) | ORAL | Status: DC

## 2013-05-31 MED ORDER — TRAMADOL HCL 50 MG PO TABS: 50.0000 mg | ORAL_TABLET | Freq: Four times a day (QID) | ORAL | Status: DC | PRN

## 2013-05-31 NOTE — ED Notes (Signed)
Pt c/o right wrist inj onset last night while at work Reports she was p/u cooking oil that weighed about 30 lbs when she felt pain and dropped it Since then, she's been having pain that increases w/movement and swelling Denies: tingly/numbness... She is alert and oriented w/no signs of acute distress.

## 2013-06-04 NOTE — ED Provider Notes (Signed)
History     CSN: [id number]  Arrival date & time 05/31/13  1732   First MD Initiated Contact with Patient 05/31/13 1914      Chief Complaint  Patient presents with  . Wrist Injury    (Consider location/radiation/quality/duration/timing/severity/associated sxs/prior treatment) HPI Comments: 62 y/o right-handed female here c/o right wrist pain after picking up a heavy object (a cooking oil that weighed about 30 lbs) at work last night. Denies fall or direct blunt to affected area. States pain increased with movement but denies numbness, weakness or paresthesias.   Past Medical History  Diagnosis Date  . Hypertension   . CAD (coronary artery disease)     a. reported hx of PCI in 2000 (?records);  b.  Myoview 5/10:  EF 71%, no ischemia  . HLD (hyperlipidemia)   . Hypothyroidism   . SVD (spontaneous vaginal delivery)     x 5  . Atrial fibrillation     Echo 5/11:  EF 65%, NL diast fxn  . Arthritis     wrist, hands - tx with otc meds  . Postmenopausal bleeding 12/07/2012    Past Surgical History  Procedure Laterality Date  . Tubal ligation    . Dilation and curettage of uterus      hysteroscopy  . Joint replacement      left hip   . Hysteroscopy w/d&c  12/07/2012    Procedure: DILATATION AND CURETTAGE /HYSTEROSCOPY;  Surgeon: Robley Fries, MD;  Location: [facility];  Service: Gynecology;  Laterality: N/A;  Dilitation and curettage /hysteroscopy/biopsey of lower segment of uterus    Family History  Problem Relation Age of Onset  . Diabetes Mother   . Heart disease Mother   . Heart disease Father   . Cancer Brother     intestinal  . Cancer Brother     throat ca  . Cancer Brother     bladder    History  Substance Use Topics  . Smoking status: Former Smoker -- 1.50 packs/day for 15 years    Types: Cigarettes    Quit date: 11/09/1981  . Smokeless tobacco: Never Used  . Alcohol Use: 1.8 oz/week    3 Cans of beer per week     Comment: weekly    OB History   Grav  Para Term Preterm Abortions TAB SAB Ect Mult Living                  Review of Systems  Constitutional: Negative for fever and chills.  Musculoskeletal:       As per history of present illness  Skin: Negative for rash.  All other systems reviewed and are negative.    Allergies  Penicillins and Percocet  Home Medications   Current Outpatient Rx  Name  Route  Sig  Dispense  Refill  . aspirin EC 81 MG tablet   Oral   Take 1 tablet (81 mg total) by mouth daily.   90 tablet   3   . atenolol (TENORMIN) 50 MG tablet      TAKE ONE TABLET BY MOUTH EVERY DAY   30 tablet   3   . benzonatate (TESSALON) 200 MG capsule   Oral   Take 1 capsule (200 mg total) by mouth 3 (three) times daily as needed for cough.   20 capsule   0   . diltiazem (CARDIZEM CD) 120 MG 24 hr capsule   Oral   Take 1 capsule (120 mg total) by mouth daily.  30 capsule   3   . levothyroxine (SYNTHROID, LEVOTHROID) 125 MCG tablet   Oral   Take 125 mcg by mouth daily.         . naproxen (NAPROSYN) 500 MG tablet   Oral   Take 1 tablet (500 mg total) by mouth 2 (two) times daily with a meal.   14 tablet   0   . rosuvastatin (CRESTOR) 40 MG tablet   Oral   Take 1 tablet (40 mg total) by mouth daily.   30 tablet   3   . traMADol (ULTRAM) 50 MG tablet   Oral   Take 1 tablet (50 mg total) by mouth every 6 (six) hours as needed for pain.   15 tablet   0     BP 167/79  Pulse 59  Temp(Src) 98.4 F (36.9 C) (Oral)  Resp 16  SpO2 96%  Physical Exam  Nursing note and vitals reviewed. Constitutional: She is oriented to person, place, and time. She appears well-developed and well-nourished. No distress.  HENT:  Head: Normocephalic and atraumatic.  Cardiovascular: Normal heart sounds.   Pulmonary/Chest: Breath sounds normal.  Musculoskeletal:  Right wrist: No obvious deformity, swelling or erythema. Patient able to make a fist, abduct and adduct digits with reported minimal discomfort  including thumb opposition to other digits. Difuse tenderness over the dorsal carpal bones, pain worse with wrist flexion, no focal tenderness over metacarpal bones.  Intact 2 point discrimination in the dorsal and palmar aspect of the hand and fingers. Patent radial and ulnar pulses with brisk cap refill at the tip of the fingers.   Neurological: She is alert and oriented to person, place, and time.  Skin: No rash noted. She is not diaphoretic.    ED Course  Procedures (including critical care time)  Labs Reviewed - No data to display No results found.   1. Wrist sprain and strain, right, initial encounter       MDM  Placed on a wrist splint. Prescribed naproxen and tramadol. Supportive care including rehabilitation exercises and red flags that should prompt her return discussed with patient and provided in writing.          Sharin Grave, MD 06/04/13 1120

## 2013-08-06 ENCOUNTER — Ambulatory Visit (INDEPENDENT_AMBULATORY_CARE_PROVIDER_SITE_OTHER): Payer: 59 | Admitting: Family Medicine

## 2013-08-06 ENCOUNTER — Encounter: Payer: Self-pay | Admitting: Family Medicine

## 2013-08-06 VITALS — BP 128/60 | HR 62 | Temp 97.9°F | Ht 63.0 in | Wt 192.0 lb

## 2013-08-06 DIAGNOSIS — M722 Plantar fascial fibromatosis: Secondary | ICD-10-CM

## 2013-08-06 DIAGNOSIS — R5381 Other malaise: Secondary | ICD-10-CM

## 2013-08-06 DIAGNOSIS — R5383 Other fatigue: Secondary | ICD-10-CM

## 2013-08-06 NOTE — Progress Notes (Signed)
Subjective:     Patient ID: [name], female   DOB: February 13, 1951, 62 y.o.   MRN: [id number]  HPI 1. Fatigue: She has a one month history of increased fatigue. She gets 7 hours of sleep a night but wakes 2-3 times to use the bathroom. She has a scheduled OB/GYN appointment to discuss this. She doesn't drink caffeine other than unsweet tea. 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 She also takes care of her great-grandson full time. She tries to walk 3 times a week for up to 30 minutes. She hasn't started a new job or have an added stress as of late.   2. Heel Pain: This began two weeks ago. It's located in the midline of the heel of the foot. There is no radiation. It is worst with the first few steps in the morning. It feels like a bruise. She has never had any trauma to her heel. She changed in insoles and her shoes but has not made a difference. She has taken tylenol for pain.   Review of Systems WNL other than noted above.     Objective:   Physical Exam Gen: NAD, alert, cooperative with exam CV: RRR, good S1/S2, no murmur Resp: CTABL, no wheezes, non-labored Abd: SNTND, BS present, no guarding or organomegaly Ext: No edema, warm and well perfused. <2 capillary refill  MSK: normal gait. Normal heel and tip toe raises, fungus noted on toenails, right heel tender to palpation (4/10), no achilles tenderness, no tendonopathy noted Neuro: Alert and oriented, No gross deficits     Assessment:     1. Fatigue associated with exertion   2. Plantar Fasciitis.     Plan:     1. Normal TSH 4 months ago. Doubt anemia with no history of bleeding and cap refill <2. No new added stressors. Seems to be related to exertion. Advised to try to relax from her daily routine as much as she can and if symptoms persist then schedule a follow up in 1 month.  2. Naproxen for 1 1/2 weeks. Stretching in the morning before she gets out of bed. Pulling toes toward chest and holding it for 15-20  seconds x 3 times. Use frozen bottle rolling technique. Tennis ball rolling technique. If continues, schedule f/u with possible injection or midfoot wrap.

## 2013-08-06 NOTE — Patient Instructions (Signed)
Nice to meet you,   I think you are tired due to how much you work and how many responsibilities you have. I want you to ease up your workload as much as you can. If this keeps up for another month than follow up with me.   Your heel pain sounds like plantar fasciitis. There are several treatments for this but not a great one so it may take a while before we find one that works. This is a pain that can last a while so that's good to have in mind.    - Before you get out of bed in the morning, I want you to bend down and grab your toe and pull it towards your body. Hold this for 15-20 seconds. Repeat this 3 times before you get out of bed. This is to stretch your foot.   - I want you to take Naproxen as directed for 1 1/2 weeks   - I also want you to take a soda bottle, fill it with water and put it in the freezer. Once frozen, roll it back and forth on the arch of your foot for 20 minutes a time for a couple times a day.   - if the ice doesn't help, take a tennis ball and roll it back and forth on the arch of your foot as much as you want.   - if none of these options work, then follow up with me and we can try other devices. There is an option for an injection or a midfoot strap.   Thanks.

## 2013-08-17 ENCOUNTER — Other Ambulatory Visit: Payer: Self-pay | Admitting: *Deleted

## 2013-08-17 DIAGNOSIS — E039 Hypothyroidism, unspecified: Secondary | ICD-10-CM

## 2013-08-17 MED ORDER — LEVOTHYROXINE SODIUM 125 MCG PO TABS: 125.0000 ug | ORAL_TABLET | Freq: Every day | ORAL | Status: DC

## 2013-09-16 ENCOUNTER — Other Ambulatory Visit: Payer: Self-pay | Admitting: Family Medicine

## 2013-09-16 DIAGNOSIS — I10 Essential (primary) hypertension: Secondary | ICD-10-CM

## 2013-09-19 ENCOUNTER — Other Ambulatory Visit: Payer: Self-pay | Admitting: Family Medicine

## 2013-09-19 NOTE — Telephone Encounter (Signed)
I refilled [name] Diltiazem but she has not had a CMET or BMET in over a year so I put a future order for a CMET. She should also schedule a nurse visit at the time of her lab collection to have her blood pressure checked.

## 2013-09-20 NOTE — Telephone Encounter (Signed)
Related message,pt will call later today to schedule both nurse visit for BP check and lab appointment. [name], [name]

## 2013-09-24 ENCOUNTER — Other Ambulatory Visit (INDEPENDENT_AMBULATORY_CARE_PROVIDER_SITE_OTHER): Payer: Self-pay

## 2013-09-24 DIAGNOSIS — I10 Essential (primary) hypertension: Secondary | ICD-10-CM

## 2013-09-24 LAB — COMPREHENSIVE METABOLIC PANEL
ALT: 16 U/L (ref 0–35)
AST: 17 U/L (ref 0–37)
Albumin: 4.1 g/dL (ref 3.5–5.2)
Alkaline Phosphatase: 67 U/L (ref 39–117)
BUN: 16 mg/dL (ref 6–23)
CO2: 25 mEq/L (ref 19–32)
Calcium: 9.4 mg/dL (ref 8.4–10.5)
Chloride: 105 mEq/L (ref 96–112)
Creat: 0.88 mg/dL (ref 0.50–1.10)
Glucose, Bld: 129 mg/dL — ABNORMAL HIGH (ref 70–99)
Potassium: 4.4 mEq/L (ref 3.5–5.3)
Sodium: 139 mEq/L (ref 135–145)
Total Bilirubin: 0.5 mg/dL (ref 0.3–1.2)
Total Protein: 7.1 g/dL (ref 6.0–8.3)

## 2013-09-24 NOTE — Progress Notes (Signed)
CMP DONE TODAY [name] 

## 2013-09-25 ENCOUNTER — Emergency Department (HOSPITAL_COMMUNITY)
Admission: EM | Admit: 2013-09-25 | Discharge: 2013-09-25 | Disposition: A | Discharge status: Home / Self Care | Payer: Worker's Compensation | Attending: Emergency Medicine | Admitting: Emergency Medicine

## 2013-09-25 ENCOUNTER — Emergency Department (HOSPITAL_COMMUNITY): Payer: Worker's Compensation

## 2013-09-25 ENCOUNTER — Encounter (HOSPITAL_COMMUNITY): Payer: Self-pay | Admitting: Adult Health

## 2013-09-25 DIAGNOSIS — Z8739 Personal history of other diseases of the musculoskeletal system and connective tissue: Secondary | ICD-10-CM | POA: Insufficient documentation

## 2013-09-25 DIAGNOSIS — I2510 Atherosclerotic heart disease of native coronary artery without angina pectoris: Secondary | ICD-10-CM | POA: Insufficient documentation

## 2013-09-25 DIAGNOSIS — Z7982 Long term (current) use of aspirin: Secondary | ICD-10-CM | POA: Insufficient documentation

## 2013-09-25 DIAGNOSIS — Y939 Activity, unspecified: Secondary | ICD-10-CM | POA: Insufficient documentation

## 2013-09-25 DIAGNOSIS — E785 Hyperlipidemia, unspecified: Secondary | ICD-10-CM | POA: Insufficient documentation

## 2013-09-25 DIAGNOSIS — W010XXA Fall on same level from slipping, tripping and stumbling without subsequent striking against object, initial encounter: Secondary | ICD-10-CM | POA: Insufficient documentation

## 2013-09-25 DIAGNOSIS — S79919A Unspecified injury of unspecified hip, initial encounter: Secondary | ICD-10-CM | POA: Insufficient documentation

## 2013-09-25 DIAGNOSIS — Y9269 Other specified industrial and construction area as the place of occurrence of the external cause: Secondary | ICD-10-CM | POA: Insufficient documentation

## 2013-09-25 DIAGNOSIS — Y990 Civilian activity done for income or pay: Secondary | ICD-10-CM | POA: Insufficient documentation

## 2013-09-25 DIAGNOSIS — E039 Hypothyroidism, unspecified: Secondary | ICD-10-CM | POA: Insufficient documentation

## 2013-09-25 DIAGNOSIS — Z8742 Personal history of other diseases of the female genital tract: Secondary | ICD-10-CM | POA: Insufficient documentation

## 2013-09-25 DIAGNOSIS — I10 Essential (primary) hypertension: Secondary | ICD-10-CM | POA: Insufficient documentation

## 2013-09-25 DIAGNOSIS — S5001XA Contusion of right elbow, initial encounter: Secondary | ICD-10-CM

## 2013-09-25 DIAGNOSIS — Z87891 Personal history of nicotine dependence: Secondary | ICD-10-CM | POA: Insufficient documentation

## 2013-09-25 DIAGNOSIS — Z79899 Other long term (current) drug therapy: Secondary | ICD-10-CM | POA: Insufficient documentation

## 2013-09-25 DIAGNOSIS — S5000XA Contusion of unspecified elbow, initial encounter: Secondary | ICD-10-CM | POA: Insufficient documentation

## 2013-09-25 DIAGNOSIS — Z8679 Personal history of other diseases of the circulatory system: Secondary | ICD-10-CM | POA: Insufficient documentation

## 2013-09-25 NOTE — Discharge Instructions (Signed)
Elbow Contusion  An elbow contusion is a deep bruise of the elbow. Contusions are the result of an injury that caused bleeding under the skin. The contusion may turn blue, purple, or yellow. Minor injuries will give you a painless contusion, but more severe contusions may stay painful and swollen for a few weeks.   CAUSES   An elbow contusion comes from a direct force to that area, such as falling on the elbow.  SYMPTOMS    Swelling and redness of the elbow.   Bruising of the elbow area.   Tenderness or soreness of the elbow.  DIAGNOSIS   You will have a physical exam and will be asked about your history. You may need an X-ray of your elbow to look for a broken bone (fracture).   TREATMENT   A sling or splint may be needed to support your injury. Resting, elevating, and applying cold compresses to the elbow area are often the best treatments for an elbow contusion. Over-the-counter medicines may also be recommended for pain control.  HOME CARE INSTRUCTIONS    Put ice on the injured area.   Put ice in a plastic bag.   Place a towel between your skin and the bag.   Leave the ice on for 15-20 minutes, 3-4 times a day.   Only take over-the-counter or prescription medicines for pain, discomfort, or fever as directed by your caregiver.   Rest your injured elbow until the pain and swelling are better.   Elevate your elbow to reduce swelling.   Apply a compression wrap as directed by your caregiver. This can help reduce swelling and motion. You may remove the wrap for sleeping, showers, and baths. If your fingers become numb, cold, or blue, take the wrap off and reapply it more loosely.   Use your elbow only as directed by your caregiver. You may be asked to do range of motion exercises. Do them as directed.   See your caregiver as directed. It is very important to keep all follow-up appointments in order to avoid any long-term problems with your elbow, including chronic pain or inability to move your elbow  normally.  SEEK IMMEDIATE MEDICAL CARE IF:    You have increased redness, swelling, or pain in your elbow.   Your swelling or pain is not relieved with medicines.   You have swelling of the hand and fingers.   You are unable to move your fingers or wrist.   You begin to lose feeling in your hand or fingers.   Your fingers or hand become cold or blue.  MAKE SURE YOU:    Understand these instructions.   Will watch your condition.   Will get help right away if you are not doing well or get worse.  Document Released: 11/21/2006 Document Revised: 03/06/2012 Document Reviewed: 10/29/2011  Exit[employer] Patient Information 2014 [employer].

## 2013-09-25 NOTE — ED Provider Notes (Signed)
CSN: [id number]     Arrival date & time 09/25/13  1750 History  This chart was scribed for non-physician practitioner Felicie Morn, NP, working with Toy Baker, MD by Ronal Fear, ED scribe. This patient was seen in room TR06C/TR06C and the patient's care was started at 7:08 PM.   Chief Complaint  Patient presents with  . Fall    Patient is a 62 y.o. female presenting with fall. The history is provided by the patient. No language interpreter was used.  Fall This is a new problem. The current episode started 3 to 5 hours ago. The problem occurs rarely. The problem has not changed since onset.The symptoms are aggravated by bending. Nothing relieves the symptoms.  Pt slipped and fell at work at 3 pm this afternoon and hit her head, right elbow and left hip where she had a previous hip replacement. Her right elbow is tender to palpation and ROM is limited by pain. Pt does not seem to be in any acute distress with no other complaints.  Past Medical History  Diagnosis Date  . Hypertension   . CAD (coronary artery disease)     a. reported hx of PCI in 2000 (?records);  b.  Myoview 5/10:  EF 71%, no ischemia  . HLD (hyperlipidemia)   . Hypothyroidism   . SVD (spontaneous vaginal delivery)     x 5  . Atrial fibrillation     Echo 5/11:  EF 65%, NL diast fxn  . Arthritis     wrist, hands - tx with otc meds  . Postmenopausal bleeding 12/07/2012   Past Surgical History  Procedure Laterality Date  . Tubal ligation    . Dilation and curettage of uterus      hysteroscopy  . Joint replacement      left hip   . Hysteroscopy w/d&c  12/07/2012    Procedure: DILATATION AND CURETTAGE /HYSTEROSCOPY;  Surgeon: Robley Fries, MD;  Location: [facility];  Service: Gynecology;  Laterality: N/A;  Dilitation and curettage /hysteroscopy/biopsey of lower segment of uterus   Family History  Problem Relation Age of Onset  . Diabetes Mother   . Heart disease Mother   . Heart disease Father   . Cancer Brother      intestinal  . Cancer Brother     throat ca  . Cancer Brother     bladder   History  Substance Use Topics  . Smoking status: Former Smoker -- 1.50 packs/day for 15 years    Types: Cigarettes    Quit date: 11/09/1981  . Smokeless tobacco: Never Used  . Alcohol Use: 1.8 oz/week    3 Cans of beer per week     Comment: weekly   OB History   Grav Para Term Preterm Abortions TAB SAB Ect Mult Living                 Review of Systems  Musculoskeletal: Positive for arthralgias.  All other systems reviewed and are negative.    Allergies  Penicillins and Percocet  Home Medications   Current Outpatient Rx  Name  Route  Sig  Dispense  Refill  . aspirin EC 81 MG tablet   Oral   Take 1 tablet (81 mg total) by mouth daily.   90 tablet   3   . atenolol (TENORMIN) 50 MG tablet   Oral   Take 25 mg by mouth every evening.         . diltiazem (CARDIZEM CD)  120 MG 24 hr capsule   Oral   Take 120 mg by mouth daily.         Marland Kitchen ibuprofen (ADVIL,MOTRIN) 200 MG tablet   Oral   Take 400 mg by mouth every 6 (six) hours as needed for pain.         Marland Kitchen levothyroxine (SYNTHROID, LEVOTHROID) 125 MCG tablet   Oral   Take 1 tablet (125 mcg total) by mouth daily.   30 tablet   3   . rosuvastatin (CRESTOR) 40 MG tablet   Oral   Take 1 tablet (40 mg total) by mouth daily.   30 tablet   3   . traMADol (ULTRAM) 50 MG tablet   Oral   Take 1 tablet (50 mg total) by mouth every 6 (six) hours as needed for pain.   15 tablet   0    BP 153/66  Pulse 60  Temp(Src) 98.3 F (36.8 C) (Oral)  Resp 18  SpO2 95% Physical Exam  Nursing note and vitals reviewed. Constitutional: She is oriented to person, place, and time. She appears well-developed and well-nourished. No distress.  HENT:  Head: Normocephalic and atraumatic.  Eyes: EOM are normal.  Neck: Neck supple. No tracheal deviation present.  Cardiovascular: Normal rate.   Pulmonary/Chest: Effort normal. No respiratory  distress.  Musculoskeletal: Normal range of motion. She exhibits tenderness. She exhibits no edema.  Right elbow is tender to palpation. ROM intact. increased pain at full extentsion  Neurological: She is alert and oriented to person, place, and time.  Skin: Skin is warm and dry.  Psychiatric: She has a normal mood and affect. Her behavior is normal.    ED Course  Procedures (including critical care time)  DIAGNOSTIC STUDIES: Oxygen Saturation is 95% on RA, adequate by my interpretation.    COORDINATION OF CARE: 7:11 PM- Pt advised of plan for treatment including a sling for the arm and RICE regimen-- if pain does not resolve or improve, she is to follow up with her PCP. and pt agrees.     Labs Review Labs Reviewed - No data to display Imaging Review Dg Elbow Complete Right  09/25/2013   CLINICAL DATA:  Larey Seat. Right elbow pain.  EXAM: RIGHT ELBOW - COMPLETE 3+ VIEW  COMPARISON:  None  FINDINGS: The joint spaces are maintained. No acute fracture. No joint effusion.  IMPRESSION: No acute bony findings.   Electronically Signed   By: Loralie Champagne M.D.   On: 09/25/2013 18:40   Dg Hip Complete Left  09/25/2013   CLINICAL DATA:  Fall, left hip pain, status post left hip replacement  EXAM: LEFT HIP - COMPLETE 2+ VIEW  COMPARISON:  12/11/2010  FINDINGS: No fracture or dislocation is seen.  Left total hip arthroplasty without evidence of complication.  Visualized bony pelvis appears intact.  IMPRESSION: No fracture or dislocation is seen.  Left total hip arthroplasty without evidence of complication.   Electronically Signed   By: Charline Bills M.D.   On: 09/25/2013 18:40   Radiology results reviewed and shared with patient. MDM  Right elbow contusion.   I personally performed the services described in this documentation, which was scribed in my presence. The recorded information has been reviewed and is accurate.    Jimmye Norman, NP 09/26/13 0130

## 2013-09-25 NOTE — ED Notes (Signed)
Presents post fall, slipped in a puddle at 3 this afternoon. C/o right elbow pain and left hip burning sensation. CMS intact. Denies LOC.

## 2013-09-28 NOTE — ED Provider Notes (Signed)
Medical screening examination/treatment/procedure(s) were performed by non-physician practitioner and as supervising physician I was immediately available for consultation/collaboration.  Erlin Gardella T Rebekha Diveley, MD 09/28/13 0711 

## 2013-10-24 ENCOUNTER — Other Ambulatory Visit: Payer: Self-pay | Admitting: Family Medicine

## 2013-12-18 ENCOUNTER — Ambulatory Visit (INDEPENDENT_AMBULATORY_CARE_PROVIDER_SITE_OTHER): Payer: 59 | Admitting: Physician Assistant

## 2013-12-18 ENCOUNTER — Encounter: Payer: Self-pay | Admitting: Physician Assistant

## 2013-12-18 VITALS — BP 110/70 | HR 58 | Ht 63.0 in | Wt 196.1 lb

## 2013-12-18 DIAGNOSIS — E785 Hyperlipidemia, unspecified: Secondary | ICD-10-CM

## 2013-12-18 DIAGNOSIS — I10 Essential (primary) hypertension: Secondary | ICD-10-CM

## 2013-12-18 DIAGNOSIS — I4891 Unspecified atrial fibrillation: Secondary | ICD-10-CM

## 2013-12-18 DIAGNOSIS — I2510 Atherosclerotic heart disease of native coronary artery without angina pectoris: Secondary | ICD-10-CM

## 2013-12-18 MED ORDER — ASPIRIN EC 81 MG PO TBEC: 81.0000 mg | DELAYED_RELEASE_TABLET | Freq: Every day | ORAL | Status: AC

## 2013-12-18 MED ORDER — WARFARIN SODIUM 5 MG PO TABS: ORAL_TABLET | ORAL | Status: DC

## 2013-12-18 NOTE — Progress Notes (Signed)
[address] [place], [place]  [address] Phone: [phone] Fax:  [phone]  Date:  12/18/2013   ID:  [name], DOB May 26, 1951, MRN [id number]  PCP:  Myra Rude, MD  Cardiologist:  Dr. Rollene Rotunda     History of Present Illness: [name] is a 62 y.o. female with a hx of atrial fibrillation on chronic Coumadin therapy, reported CAD with a remote history of PCI, HL, hypothyroidism.  Myoview in 2010 negative for ischemia.  Echo in 2011 with normal LVF. Last seen by Dr. Rollene Rotunda in 10/2012.  She had been taken off of warfarin b/c of vaginal bleeding.  Six month follow up was recommended.    Since last seen, her sister developed cancer and died.  Since then, she has had more palpitations that she feels is c/w AFib.  She has taken extra Atenolol that has provided relief.  She does feel short of breath when she has palpitations.  Otherwise, she denies chest pain, DOE, syncope, near syncope, orthopnea, PND, edema.    Recent Labs: 03/16/2013: HDL 54; LDL (calc) 149*; TSH 1.900  09/24/2013: ALT 16; Creatinine 0.88; Potassium 4.4   Wt Readings from Last 3 Encounters:  12/18/13 196 lb 1.9 oz (88.959 kg)  08/06/13 192 lb (87.091 kg)  03/16/13 186 lb 12.8 oz (84.732 kg)     Past Medical History  Diagnosis Date  . Hypertension   . CAD (coronary artery disease)     a. reported hx of PCI in 2000 (?records);  b.  Myoview 5/10:  EF 71%, no ischemia  . HLD (hyperlipidemia)   . Hypothyroidism   . SVD (spontaneous vaginal delivery)     x 5  . Atrial fibrillation     Echo 5/11:  EF 65%, NL diast fxn  . Arthritis     wrist, hands - tx with otc meds  . Postmenopausal bleeding 12/07/2012    Current Outpatient Prescriptions  Medication Sig Dispense Refill  . aspirin EC 81 MG tablet Take 1 tablet (81 mg total) by mouth daily.  90 tablet  3  . atenolol (TENORMIN) 50 MG tablet Take 25 mg by mouth every evening.      . diltiazem (CARDIZEM CD) 120 MG 24 hr  capsule Take 120 mg by mouth daily.      Marland Kitchen ibuprofen (ADVIL,MOTRIN) 200 MG tablet Take 400 mg by mouth every 6 (six) hours as needed for pain.      Marland Kitchen levothyroxine (SYNTHROID, LEVOTHROID) 125 MCG tablet Take 1 tablet (125 mcg total) by mouth daily.  30 tablet  3  . rosuvastatin (CRESTOR) 40 MG tablet Take 1 tablet (40 mg total) by mouth daily.  30 tablet  3  . traMADol (ULTRAM) 50 MG tablet Take 1 tablet (50 mg total) by mouth every 6 (six) hours as needed for pain.  15 tablet  0  . [DISCONTINUED] diltiazem (DILACOR XR) 120 MG 24 hr capsule Take 1 capsule (120 mg total) by mouth daily.  30 capsule  6   No current facility-administered medications for this visit.    Allergies:   Penicillins and Percocet   Social History:  The patient  reports that she quit smoking about 32 years ago. Her smoking use included Cigarettes. She has a 22.5 pack-year smoking history. She has never used smokeless tobacco. She reports that she drinks about 1.8 ounces of alcohol per week. She reports that she does not use illicit drugs.   Family History:  The patient's family history includes Cancer in her brother, brother, and brother; Diabetes in her mother; Heart disease in her father and mother.   ROS:  Please see the history of present illness.   She denies any further vaginal bleeding.  She has a mild cough.     All other systems reviewed and negative.   PHYSICAL EXAM: VS:  BP 110/70  Pulse 58  Ht 5\' 3"  (1.6 m)  Wt 196 lb 1.9 oz (88.959 kg)  BMI 34.75 kg/m2 Well nourished, well developed, in no acute distress HEENT: normal Neck: no JVD Cardiac:  normal S1, S2; RRR; no murmur Lungs:  clear to auscultation bilaterally, no wheezing, rhonchi or rales Abd: soft, nontender, no hepatomegaly Ext: no edema Skin: warm and dry Neuro:  CNs 2-12 intact, no focal abnormalities noted  EKG:  Sinus brady, HR 58, normal axis, no ST changes     ASSESSMENT AND PLAN:  1. Atrial Fibrillation:  She has had paroxysms of  palpitations that she feels is c/w her prior AFib.  She has been under a great deal of stress with the death of her sister.  She feels that her palpitations are no more frequent than what she has had in the past.  We had a long discussion regarding further testing (ie event monitor).  At this point, we have decided to continue to monitor her symptoms.  If she has more frequent symptoms, we can consider a monitor to better characterize her rhythm and help decide if she needs AAD therapy.  Her vaginal bleeding has stopped.  Her D&C was done and cytology was neg for malignancy.  CHADS2-VASc=3.  I will start her back on Coumadin.  She can stop ASA.  2. CAD:  No angina.  We discussed routine ETT.  But, she does not feel she can walk on a treadmill.  Continue statin.   3. Hypertension:  Controlled.  4. Hyperlipidemia:  Continue statin.  This is managed by primary care.  5. Disposition:  F/u with Dr. Rollene Rotunda in 2 mos.   Signed, Tereso Newcomer, PA-C  12/18/2013 10:17 AM

## 2013-12-18 NOTE — Patient Instructions (Signed)
START COUMADIN 5 MG TABLET; TAKE 5 MG MON-FRI; TAKE 2.5 MG ON Saturday AND Sunday  YOU WILL CONTINUE FOR THE NEXT 5 DAYS THEN STOP; YOU WILL TAKE YOUR LAST DOSE ON 12/23/13  YOU WILL NEED TO RE-EST WITH THE COUMADIN CLINIC TO BE SEEN IN 1 WEEK; SINCE YOU HAVE BEEN RE-STARTED ON COUMADIN  PLEASE FOLLOW UP WITH DR. HOCHREIN IN 2 MONTHS

## 2013-12-26 ENCOUNTER — Ambulatory Visit (INDEPENDENT_AMBULATORY_CARE_PROVIDER_SITE_OTHER): Payer: 59 | Admitting: *Deleted

## 2013-12-26 DIAGNOSIS — I4891 Unspecified atrial fibrillation: Secondary | ICD-10-CM

## 2013-12-26 LAB — POCT INR: INR: 1.2

## 2013-12-26 NOTE — Patient Instructions (Signed)

## 2014-01-02 ENCOUNTER — Ambulatory Visit (INDEPENDENT_AMBULATORY_CARE_PROVIDER_SITE_OTHER): Payer: 59 | Admitting: *Deleted

## 2014-01-02 DIAGNOSIS — I4891 Unspecified atrial fibrillation: Secondary | ICD-10-CM

## 2014-01-02 LAB — POCT INR: INR: 2.4

## 2014-01-24 ENCOUNTER — Other Ambulatory Visit: Payer: Self-pay | Admitting: Family Medicine

## 2014-01-24 DIAGNOSIS — E039 Hypothyroidism, unspecified: Secondary | ICD-10-CM

## 2014-01-24 MED ORDER — LEVOTHYROXINE SODIUM 125 MCG PO TABS: 125.0000 ug | ORAL_TABLET | Freq: Every day | ORAL | Status: DC

## 2014-02-15 ENCOUNTER — Ambulatory Visit (INDEPENDENT_AMBULATORY_CARE_PROVIDER_SITE_OTHER): Payer: 59 | Admitting: Cardiology

## 2014-02-15 ENCOUNTER — Encounter: Payer: Self-pay | Admitting: Cardiology

## 2014-02-15 VITALS — BP 136/80 | HR 67 | Ht 63.0 in | Wt 194.0 lb

## 2014-02-15 DIAGNOSIS — I252 Old myocardial infarction: Secondary | ICD-10-CM

## 2014-02-15 DIAGNOSIS — I4891 Unspecified atrial fibrillation: Secondary | ICD-10-CM

## 2014-02-15 MED ORDER — ASPIRIN EC 81 MG PO TBEC: 81.0000 mg | DELAYED_RELEASE_TABLET | Freq: Every day | ORAL | Status: DC

## 2014-02-15 NOTE — Progress Notes (Signed)
HPI The patient presents for evaluation of atrial fibrillation.  Since I last saw her she has had no further symptomatic atrial fibrillation. She denies any presyncope or syncope. She has had no chest pressure, neck or arm discomfort. She walks for exercise. For awhile she was taking warfarin.  However, she had vaginal bleeding with this.  She has been off of this and has had no paroxysms of fibrillation.  She denies any of the chest pain that she had with her CAD.   Allergies  Allergen Reactions  . Penicillins Itching  . Percocet [Oxycodone-Acetaminophen] Itching   Prior to Admission medications   Medication Sig Start Date End Date Taking? Authorizing Provider  atenolol (TENORMIN) 50 MG tablet Take 25 mg by mouth every evening.   Yes Historical Provider, MD  diltiazem (CARDIZEM CD) 120 MG 24 hr capsule Take 120 mg by mouth daily.   Yes Historical Provider, MD  ibuprofen (ADVIL,MOTRIN) 200 MG tablet Take 400 mg by mouth every 6 (six) hours as needed for pain.   Yes Historical Provider, MD  levothyroxine (SYNTHROID, LEVOTHROID) 125 MCG tablet Take 1 tablet (125 mcg total) by mouth daily. 01/24/14  Yes Rosemarie Ax, MD  rosuvastatin (CRESTOR) 40 MG tablet Take 1 tablet (40 mg total) by mouth daily. 03/16/13 03/16/14 Yes [place], DO  traMADol (ULTRAM) 50 MG tablet Take 1 tablet (50 mg total) by mouth every 6 (six) hours as needed for pain. 05/31/13  Yes Adlih Moreno-Coll, MD  ASA  81 mg Take one daily         Past Medical History  Diagnosis Date  . Hypertension   . CAD (coronary artery disease)     a. reported hx of PCI in 2000 (?records);  b.  Myoview 5/10:  EF 71%, no ischemia  . HLD (hyperlipidemia)   . Hypothyroidism   . SVD (spontaneous vaginal delivery)     x 5  . Atrial fibrillation     Echo 5/11:  EF 65%, NL diast fxn  . Arthritis     wrist, hands - tx with otc meds  . Postmenopausal bleeding 12/07/2012     Past Surgical History  Procedure Laterality Date  . Tubal  ligation    . Dilation and curettage of uterus      hysteroscopy  . Joint replacement      left hip   . Hysteroscopy w/d&c  12/07/2012    Procedure: DILATATION AND CURETTAGE /HYSTEROSCOPY;  Surgeon: Elveria Royals, MD;  Location: [facility];  Service: Gynecology;  Laterality: N/A;  Dilitation and curettage /hysteroscopy/biopsey of lower segment of uterus    ROS:  As stated in the HPI and negative for all other systems.  PHYSICAL EXAM BP 136/80  Pulse 67  Ht 5\' 3"  (1.6 m)  Wt 194 lb (87.998 kg)  BMI 34.37 kg/m2  SpO2 97% GENERAL:  Well appearing NECK:  No jugular venous distention, waveform within normal limits, carotid upstroke brisk and symmetric, no bruits, no thyromegaly LUNGS:  Clear to auscultation bilaterally CHEST:  Unremarkable HEART:  PMI not displaced or sustained,S1 and S2 within normal limits, no S3, no S4, no clicks, no rubs, no murmurs ABD:  Flat, positive bowel sounds normal in frequency in pitch, no bruits, no rebound, no guarding, no midline pulsatile mass, no hepatomegaly, no splenomegaly EXT:  2 plus pulses throughout, no edema, no cyanosis no clubbing   ASSESSMENT AND PLAN  ATRIAL FIBRILLATION -  Given the vaginal bleeding she will  remain off warfarin. We will reconsider this in the future if she has documented recurrent fib. Given her coronary disease she will remain on  aspirin.  ESSENTIAL HYPERTENSION, BENIGN -  The blood pressure is at target. No change in medications is indicated. We will continue with therapeutic lifestyle changes (TLC).   CAD - She has no active ischemia and no symptoms of perfusion study in 2010. Continue with risk reduction.   DYSLIPIDEMIA - Her LDL 11 months ago was 149.  She is on maximal statin therapy.  She will continue the meds as listed.  We could consider Zetia if repeat labs per her primary MD remains elevated.

## 2014-02-15 NOTE — Patient Instructions (Addendum)
Please take Asa 81 mg a day.  Continue all other medications as listed.  Follow up in 1 year with Dr Percival Spanish.  You will receive a letter in the mail 2 months before you are due.  Please call us when you receive this letter to schedule your follow up appointment.

## 2014-04-04 ENCOUNTER — Ambulatory Visit: Payer: Self-pay | Admitting: Cardiology

## 2014-04-04 DIAGNOSIS — I4891 Unspecified atrial fibrillation: Secondary | ICD-10-CM

## 2014-04-11 ENCOUNTER — Ambulatory Visit (INDEPENDENT_AMBULATORY_CARE_PROVIDER_SITE_OTHER): Payer: Medicare Other | Admitting: Home Health Services

## 2014-04-11 ENCOUNTER — Encounter: Payer: Self-pay | Admitting: Home Health Services

## 2014-04-11 ENCOUNTER — Encounter: Payer: Self-pay | Admitting: Internal Medicine

## 2014-04-11 VITALS — BP 150/76 | HR 48 | Temp 97.3°F | Ht 63.5 in | Wt 197.2 lb

## 2014-04-11 DIAGNOSIS — I10 Essential (primary) hypertension: Secondary | ICD-10-CM

## 2014-04-11 DIAGNOSIS — E785 Hyperlipidemia, unspecified: Secondary | ICD-10-CM

## 2014-04-11 DIAGNOSIS — E119 Type 2 diabetes mellitus without complications: Secondary | ICD-10-CM | POA: Insufficient documentation

## 2014-04-11 DIAGNOSIS — Z0000 Encounter for general adult medical examination without abnormal findings: Secondary | ICD-10-CM

## 2014-04-11 DIAGNOSIS — R7309 Other abnormal glucose: Secondary | ICD-10-CM

## 2014-04-11 DIAGNOSIS — R739 Hyperglycemia, unspecified: Secondary | ICD-10-CM

## 2014-04-11 DIAGNOSIS — E039 Hypothyroidism, unspecified: Secondary | ICD-10-CM

## 2014-04-11 LAB — CBC
HCT: 39.2 % (ref 36.0–46.0)
Hemoglobin: 13.1 g/dL (ref 12.0–15.0)
MCH: 29.6 pg (ref 26.0–34.0)
MCHC: 33.4 g/dL (ref 30.0–36.0)
MCV: 88.5 fL (ref 78.0–100.0)
Platelets: 240 10*3/uL (ref 150–400)
RBC: 4.43 MIL/uL (ref 3.87–5.11)
RDW: 13.4 % (ref 11.5–15.5)
WBC: 7.9 10*3/uL (ref 4.0–10.5)

## 2014-04-11 LAB — COMPLETE METABOLIC PANEL WITH GFR
ALT: 15 U/L (ref 0–35)
AST: 17 U/L (ref 0–37)
Albumin: 3.9 g/dL (ref 3.5–5.2)
Alkaline Phosphatase: 62 U/L (ref 39–117)
BUN: 15 mg/dL (ref 6–23)
CO2: 27 mEq/L (ref 19–32)
Calcium: 9.2 mg/dL (ref 8.4–10.5)
Chloride: 104 mEq/L (ref 96–112)
Creat: 0.83 mg/dL (ref 0.50–1.10)
GFR, Est African American: 86 mL/min
GFR, Est Non African American: 74 mL/min
Glucose, Bld: 109 mg/dL — ABNORMAL HIGH (ref 70–99)
Potassium: 4.7 mEq/L (ref 3.5–5.3)
Sodium: 139 mEq/L (ref 135–145)
Total Bilirubin: 0.5 mg/dL (ref 0.2–1.2)
Total Protein: 7.4 g/dL (ref 6.0–8.3)

## 2014-04-11 LAB — LIPID PANEL
Cholesterol: 221 mg/dL — ABNORMAL HIGH (ref 0–200)
HDL: 56 mg/dL (ref >39)
LDL Cholesterol: 135 mg/dL — ABNORMAL HIGH (ref 0–99)
Total CHOL/HDL Ratio: 3.9 Ratio
Triglycerides: 148 mg/dL (ref <150)
VLDL: 30 mg/dL (ref 0–40)

## 2014-04-11 LAB — POCT GLYCOSYLATED HEMOGLOBIN (HGB A1C): Hemoglobin A1C: 6.2

## 2014-04-11 LAB — TSH: TSH: 4.41 u[IU]/mL (ref 0.350–4.500)

## 2014-04-11 NOTE — Progress Notes (Signed)
Patient here for annual wellness visit, patient reports: Risk Factors/Conditions needing evaluation or treatment: Pt does not have any new3 risk factors that need evaluation.  Home Safety: Pt lives at home with husband, 2 sons, 1 granddaughter, and 1 great-grandson. Other Information: Corrective lens: Pt wears daily corrective lens.  Pt reports having regular exams at [facility] on [facility]. Dentures: Pt has full dentures. Memory: Pt denies memory problems. Patient's Mini Mental Score (recorded in doc. flowsheet): 29 Med Adherence:  We discussed importance of taking medications regularly for htn and cholesterol.  Pt reports 0 missed days in past 2 weeks. BMI/Exercise:  We discussed BMI and strategies for weight loss including portion control and daily regular exercise routine.  Pt reports walking over 10k step on days she works. Bladder:  Pt reports some problems with bladder and plans on making an appointment with her gynecologist. ADL/IADL:  Pt reports independence in all functions. Balance/Gait: Pt reports 2 falls in the past year.  Reports sometimes weakness in left hip- due to past hip replacement.  We discussed strategies for home safety and fall prevention.  We discussed starting her previous PT exercise at home again for strength build up    Annual Wellness Visit Requirements Recorded Today In  Medical, family, social history Past Medical, Family, Social History Section  Current providers Care team  Current medications Medications  Wt, BP, Ht, BMI Vital signs  Hearing assessment (welcome visit) Hearing/vision  Tobacco, alcohol, illicit drug use History  ADL Nurse Assessment  Depression Screening Nurse Assessment  Cognitive impairment Nurse Assessment  Mini Mental Status Document Flowsheet  Fall Risk Fall/Depression  Home Safety Progress Note  End of Life Planning (welcome visit) Social Documentation  Medicare preventative services Progress Note  Risk factors/conditions needing  evaluation/treatment Progress Note  Personalized health advice Patient Instructions, goals, letter  Diet & Exercise Social Documentation  Emergency Contact Social Documentation  Seat Belts Social Documentation  Sun exposure/protection Social Documentation

## 2014-04-19 ENCOUNTER — Ambulatory Visit: Payer: Medicare Other | Admitting: Family Medicine

## 2014-04-23 ENCOUNTER — Encounter: Payer: Self-pay | Admitting: Family Medicine

## 2014-04-23 ENCOUNTER — Ambulatory Visit (INDEPENDENT_AMBULATORY_CARE_PROVIDER_SITE_OTHER): Payer: Medicare Other | Admitting: Family Medicine

## 2014-04-23 VITALS — BP 130/77 | HR 69 | Temp 98.0°F | Ht 63.0 in | Wt 194.0 lb

## 2014-04-23 DIAGNOSIS — Z23 Encounter for immunization: Secondary | ICD-10-CM

## 2014-04-23 DIAGNOSIS — R7309 Other abnormal glucose: Secondary | ICD-10-CM

## 2014-04-23 DIAGNOSIS — E785 Hyperlipidemia, unspecified: Secondary | ICD-10-CM

## 2014-04-23 DIAGNOSIS — R739 Hyperglycemia, unspecified: Secondary | ICD-10-CM

## 2014-04-23 DIAGNOSIS — I4891 Unspecified atrial fibrillation: Secondary | ICD-10-CM

## 2014-04-23 DIAGNOSIS — I252 Old myocardial infarction: Secondary | ICD-10-CM

## 2014-04-23 MED ORDER — ROSUVASTATIN CALCIUM 40 MG PO TABS: 40.0000 mg | ORAL_TABLET | Freq: Every day | ORAL | Status: DC

## 2014-04-23 MED ORDER — ATENOLOL 50 MG PO TABS: 25.0000 mg | ORAL_TABLET | Freq: Every evening | ORAL | Status: DC

## 2014-04-23 NOTE — Patient Instructions (Signed)
Thank you for coming in,   You are qualified as having pre-diabetes. I have placed a food recommendations below. If you would like to have a nutrition consult then let me know. If you Hgb A1c goes above 6.5 then we would need to start a medication for type 2 diabetes. The best way to avoid this is through diet and exercise.   We gave you the pneumonia vaccine today. I think that you look great. Please let me or your cardiologist know if you continue to have a HR greater than 100 if you are in atrial fibrillation.   Please follow up with me as needed.    Please feel free to call with any questions or concerns at any time, at [phone]. --Dr. Raeford Razor  Diet Recommendations for Pre-Diabetes   Starchy (carb) foods include: Bread, rice, pasta, potatoes, corn, crackers, bagels, muffins, all baked goods.   Protein foods include: Meat, fish, poultry, eggs, dairy foods, and beans such as pinto and kidney beans (beans also provide carbohydrate).   1. Eat at least 3 meals and 1-2 snacks per day. Never go more than 4-5 hours while awake without eating.  2. Limit starchy foods to TWO per meal and ONE per snack. ONE portion of a starchy  food is equal to the following:   - ONE slice of bread (or its equivalent, such as half of a hamburger bun).   - 1/2 cup of a "scoopable" starchy food such as potatoes or rice.   - 1 OUNCE (28 grams) of starchy snack foods such as crackers or pretzels (look on label).   - 15 grams of carbohydrate as shown on food label.  3. Both lunch and dinner should include a protein food, a carb food, and vegetables.   - Obtain twice as many veg's as protein or carbohydrate foods for both lunch and dinner.   - Try to keep frozen veg's on hand for a quick vegetable serving.     - Fresh or frozen veg's are best.  4. Breakfast should always include protein.

## 2014-04-25 ENCOUNTER — Encounter: Payer: Self-pay | Admitting: Family Medicine

## 2014-04-25 NOTE — Assessment & Plan Note (Signed)
Glucose of 109 on CMP and hemoglobin A1c of 6.2 - Will treat with diet and exercise for now - May need to add metformin 500 mg once daily - Recheck hemoglobin A1c in 3 months

## 2014-04-25 NOTE — Progress Notes (Signed)
   Subjective:    Patient ID: [name], female    DOB: 14-Apr-1951, 63 y.o.   MRN: [id number]  HPI  [name] is here for f/u to her labs.  Patient did have an episode yesterday where her heart was racing. She attributed it to her afib. Her cardiologist did tell her this will happen. She takes atenolol for her rate control. Her rate slowed down to a regular rate on its own and she feels better now. This only occurs once every three months. She denies any chest pain or shortness of breath today. She has had a MI previously and this episode didn't feel like an MI.   Upon reviewing her labs her CMP was only significant for glucose of 109 with a hemoglobin A1c of 6.2. Her lipid panel indicated a high intensity statin which she is currently on. Her CBC was within normal limits. Her TSH was normal as well.  Current Outpatient Prescriptions on File Prior to Visit  Medication Sig Dispense Refill  . aspirin EC 81 MG tablet Take 1 tablet (81 mg total) by mouth daily.      Marland Kitchen diltiazem (CARDIZEM CD) 120 MG 24 hr capsule Take 120 mg by mouth daily.      Marland Kitchen ibuprofen (ADVIL,MOTRIN) 200 MG tablet Take 400 mg by mouth every 6 (six) hours as needed for pain.      Marland Kitchen levothyroxine (SYNTHROID, LEVOTHROID) 125 MCG tablet Take 1 tablet (125 mcg total) by mouth daily.  90 tablet  3  . traMADol (ULTRAM) 50 MG tablet Take 1 tablet (50 mg total) by mouth every 6 (six) hours as needed for pain.  15 tablet  0  . [DISCONTINUED] diltiazem (DILACOR XR) 120 MG 24 hr capsule Take 1 capsule (120 mg total) by mouth daily.  30 capsule  6   No current facility-administered medications on file prior to visit.   Health Maintenance: to receive the prevnar 13  Review of Systems See HPI     Objective:   Physical Exam BP 130/77  Pulse 69  Temp(Src) 98 F (36.7 C) (Oral)  Ht 5\' 3"  (1.6 m)  Wt 194 lb (87.998 kg)  BMI 34.37 kg/m2 Gen: NAD, alert, cooperative with exam, well-appearing CV: irregular rhythm , good  S1/S2,  capillary refill brisk  Resp: CTABL, no wheezes, non-labored Skin: no rashes, normal turgor  Neuro: no gross deficits.  Psych: good insight, alert and oriented\     Assessment & Plan:

## 2014-04-30 NOTE — Assessment & Plan Note (Signed)
Continue statin. Well tolerated.

## 2014-04-30 NOTE — Assessment & Plan Note (Signed)
Currently rate controlled. Call Cardiologist if symptomatic.

## 2014-05-08 ENCOUNTER — Encounter: Payer: Self-pay | Admitting: Family Medicine

## 2014-05-08 ENCOUNTER — Telehealth: Payer: Self-pay | Admitting: Family Medicine

## 2014-05-08 NOTE — Telephone Encounter (Signed)
Both Crestor and Atenolol were sent to pharmacy on 04/23/14 but patient did not pick up with in [facility]'s 10 day grace period so meds to put back. Patient can pick up meds today.

## 2014-05-21 ENCOUNTER — Ambulatory Visit (AMBULATORY_SURGERY_CENTER): Payer: Self-pay | Admitting: *Deleted

## 2014-05-21 VITALS — Ht 63.0 in | Wt 200.0 lb

## 2014-05-21 DIAGNOSIS — Z86010 Personal history of colonic polyps: Secondary | ICD-10-CM

## 2014-05-21 MED ORDER — NA SULFATE-K SULFATE-MG SULF 17.5-3.13-1.6 GM/177ML PO SOLN: 1.0000 | Freq: Once | ORAL | Status: DC

## 2014-05-21 NOTE — Progress Notes (Signed)
No allergies to eggs or soy. No problems with anesthesia.  Pt not given Emmi instructions for colonoscopy.  No computer access  No oxygen use  No diet drug use

## 2014-05-28 ENCOUNTER — Ambulatory Visit (INDEPENDENT_AMBULATORY_CARE_PROVIDER_SITE_OTHER): Payer: Medicare Other | Admitting: Family Medicine

## 2014-05-28 VITALS — BP 144/67 | HR 58 | Temp 97.7°F | Resp 18 | Wt 198.0 lb

## 2014-05-28 DIAGNOSIS — B356 Tinea cruris: Secondary | ICD-10-CM | POA: Insufficient documentation

## 2014-05-28 DIAGNOSIS — E785 Hyperlipidemia, unspecified: Secondary | ICD-10-CM

## 2014-05-28 DIAGNOSIS — N898 Other specified noninflammatory disorders of vagina: Secondary | ICD-10-CM

## 2014-05-28 LAB — POCT URINALYSIS DIPSTICK
Bilirubin, UA: NEGATIVE
Glucose, UA: NEGATIVE
Ketones, UA: NEGATIVE
Nitrite, UA: NEGATIVE
Protein, UA: NEGATIVE
Spec Grav, UA: 1.02
Urobilinogen, UA: 0.2
pH, UA: 7

## 2014-05-28 MED ORDER — CLOTRIMAZOLE 1 % EX CREA: 1.0000 "application " | TOPICAL_CREAM | Freq: Two times a day (BID) | CUTANEOUS | Status: DC

## 2014-05-28 MED ORDER — ATORVASTATIN CALCIUM 80 MG PO TABS: 80.0000 mg | ORAL_TABLET | Freq: Every day | ORAL | Status: DC

## 2014-05-28 NOTE — Assessment & Plan Note (Signed)
PE c/w with tinea Clotrimazole cream BID X 2 weeks, encouraged powder Discussed Treating feet, recommended lamasil cream for them and would consider lamasil PO if jock itch recurrent due to fungal toe, but at this point would start topical as this is the first time and she's not concerned.  Follow up PRN

## 2014-05-28 NOTE — Assessment & Plan Note (Signed)
Changed from crestor to lipitor due to cost

## 2014-05-28 NOTE — Patient Instructions (Signed)
Great to meet you today!  Use the cream twice daily for 14 days.   Try some powder to keep the area clear of the rash  Come back as needed.

## 2014-05-28 NOTE — Progress Notes (Signed)
Patient ID: [name], female   DOB: 1951/07/09, 63 y.o.   MRN: [id number]  Kenn File, MD Phone: [phone]  Subjective:  Chief complaint-noted  Pt Here for itchy rash in her groin.  Patient's when she's had 2 weeks of an itchy rash or coronary. She's never had this problem before but does have a fungal infection on her feet with occasional itching and thickened toenails. She works in a [employer] and says that she gets hot and sweaty every day. She denies any significant vaginal discharge or itching. She does have such a small amount of white vaginal discharge.  She denies fever, nausea, vomiting, diarrhea, dysuria, and and abdominal pain. She also reports normal appetite  She states that her thickened toenails don't really bother her and that her feet only occasionally.  She also requests an alternative to Crestor as is costing $300 a month without her insurance.  ROS-  Per hpi  Past Medical History Patient Active Problem List   Diagnosis Date Noted  . Tinea cruris 05/28/2014  . Blood glucose elevated 04/11/2014  . Atrial fibrillation 12/18/2013  . Endometrial polyp 12/15/2012  . Postmenopausal bleeding 12/07/2012  . URI (upper respiratory infection) 06/23/2012  . Depression, major 04/19/2012  . Wrist pain, right 02/21/2012  . Hearing loss secondary to cerumen impaction 02/21/2012  . Maculopapular rash, generalized 09/15/2011  . Impaired fasting blood sugar 05/14/2011  . Colon polyps 05/06/2011  . Physical exam, annual 05/06/2011  . Postmenopausal bleeding 05/06/2011  . Vulvar dermatitis 05/06/2011  . 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 DEGENERATIVE JOINT DISEASE, LEFT HIP 07/20/2010  . HYPERLIPIDEMIA 04/29/2009  . OBESITY, UNSPECIFIED 04/29/2009  . ESSENTIAL HYPERTENSION, BENIGN 04/29/2009  . CAD 04/29/2009  . HIP PAIN, BILATERAL 09/13/2008  . HYPOTHYROIDISM, UNSPECIFIED 02/23/2007  . MYOCARDIAL INFARCTION, OLD 02/23/2007     Medications- reviewed and updated Current Outpatient Prescriptions  Medication Sig Dispense Refill  . aspirin EC 81 MG tablet Take 1 tablet (81 mg total) by mouth daily.      Marland Kitchen atenolol (TENORMIN) 50 MG tablet Take 0.5 tablets (25 mg total) by mouth every evening.  90 tablet  1  . atorvastatin (LIPITOR) 80 MG tablet Take 1 tablet (80 mg total) by mouth daily.  90 tablet  3  . clotrimazole (LOTRIMIN) 1 % cream Apply 1 application topically 2 (two) times daily.  60 g  0  . diltiazem (CARDIZEM CD) 120 MG 24 hr capsule Take 120 mg by mouth daily.      Marland Kitchen ibuprofen (ADVIL,MOTRIN) 200 MG tablet Take 400 mg by mouth every 6 (six) hours as needed for pain.      Marland Kitchen levothyroxine (SYNTHROID, LEVOTHROID) 125 MCG tablet Take 1 tablet (125 mcg total) by mouth daily.  90 tablet  3  . Na Sulfate-K Sulfate-Mg Sulf (SUPREP BOWEL PREP) SOLN Take 1 kit by mouth once. suprep as directed.  No substitutions  354 mL  0  . traMADol (ULTRAM) 50 MG tablet Take 1 tablet (50 mg total) by mouth every 6 (six) hours as needed for pain.  15 tablet  0  . [DISCONTINUED] diltiazem (DILACOR XR) 120 MG 24 hr capsule Take 1 capsule (120 mg total) by mouth daily.  30 capsule  6   No current facility-administered medications for this visit.    Objective: BP 144/67  Pulse 58  Temp(Src) 97.7 F (36.5 C) (Oral)  Resp 18  Wt 198 lb (89.812 kg)  SpO2 97% Gen: NAD, alert, cooperative with  exam HEENT: NCAT Ext: No edema, warm Neuro: Alert and oriented Skin: Groin examined with erythema and darker erythematous rim-like border, some papules and excoriations but no bleeding, fluctuance, or areas of induration.    Assessment/Plan:  HYPERLIPIDEMIA Changed from crestor to lipitor due to cost  Tinea cruris PE c/w with tinea Clotrimazole cream BID X 2 weeks, encouraged powder Discussed Treating feet, recommended lamasil cream for them and would consider lamasil PO if jock itch recurrent due to fungal toe, but at this point  would start topical as this is the first time and she's not concerned.  Follow up PRN    Orders Placed This Encounter  Procedures  . POCT urinalysis dipstick    Meds ordered this encounter  Medications  . clotrimazole (LOTRIMIN) 1 % cream    Sig: Apply 1 application topically 2 (two) times daily.    Dispense:  60 g    Refill:  0  . atorvastatin (LIPITOR) 80 MG tablet    Sig: Take 1 tablet (80 mg total) by mouth daily.    Dispense:  90 tablet    Refill:  3

## 2014-06-04 ENCOUNTER — Ambulatory Visit (AMBULATORY_SURGERY_CENTER): Payer: Medicare Other | Admitting: Internal Medicine

## 2014-06-04 ENCOUNTER — Encounter: Payer: Self-pay | Admitting: Internal Medicine

## 2014-06-04 VITALS — BP 126/70 | HR 67 | Temp 98.0°F | Resp 39 | Ht 63.0 in | Wt 200.0 lb

## 2014-06-04 DIAGNOSIS — Z800 Family history of malignant neoplasm of digestive organs: Secondary | ICD-10-CM | POA: Insufficient documentation

## 2014-06-04 DIAGNOSIS — K5730 Diverticulosis of large intestine without perforation or abscess without bleeding: Secondary | ICD-10-CM

## 2014-06-04 DIAGNOSIS — Z86010 Personal history of colonic polyps: Secondary | ICD-10-CM

## 2014-06-04 MED ORDER — SODIUM CHLORIDE 0.9 % IV SOLN: 500.0000 mL | INTRAVENOUS | Status: DC

## 2014-06-04 NOTE — Op Note (Signed)
[facility]  [address] [place], [id number]   COLONOSCOPY PROCEDURE REPORT  PATIENT: [name], [name]  MR#: [id number] BIRTHDATE: July 26, 1951 , 52  yrs. old GENDER: Female ENDOSCOPIST: Gatha Mayer, MD, [facility] PROCEDURE DATE:  06/04/2014 PROCEDURE:   Colonoscopy, surveillance First Screening Colonoscopy - Avg.  risk and is 50 yrs.  old or older - No.  Prior Negative Screening - Now for repeat screening. N/A  History of Adenoma - Now for follow-up colonoscopy & has been > or = to 3 yrs.  Yes hx of adenoma.  Has been 3 or more years since last colonoscopy.  Polyps Removed Today? No.  Recommend repeat exam, <10 yrs? Yes.  High risk (family or personal hx). ASA CLASS:   Class III INDICATIONS:Patient's personal history of adenomatous colon polyps and Patient's immediate family history of colon cancer. MEDICATIONS: propofol (Diprivan) 200mg  IV, MAC sedation, administered by CRNA, and These medications were titrated to patient response per physician's verbal order  DESCRIPTION OF PROCEDURE:   After the risks benefits and alternatives of the procedure were thoroughly explained, informed consent was obtained.  A digital rectal exam revealed no abnormalities of the rectum.   The LB CF-H180AL Loaner [id number] endoscope was introduced through the anus and advanced to the cecum, which was identified by both the appendix and ileocecal valve. No adverse events experienced.   The quality of the prep was excellent using Suprep  The instrument was then slowly withdrawn as the colon was fully examined.  COLON FINDINGS: Moderate diverticulosis was noted in the sigmoid colon.   The colon mucosa was otherwise normal.   A right colon retroflexion was performed.  Retroflexed views revealed no abnormalities. The time to cecum=1 minutes 37 seconds.  Withdrawal time=7 minutes 34 seconds.  The scope was withdrawn and the procedure completed. COMPLICATIONS: There were no  complications.  ENDOSCOPIC IMPRESSION: 1.   Moderate diverticulosis was noted in the sigmoid colon 2.   The colon mucosa was otherwise normal - excellent prep - hx adenoma 2005 and FHx CRCA (mom)  RECOMMENDATIONS: Repeat Colonoscopy in 5 years.   eSigned:  Gatha Mayer, MD, [facility] 06/04/2014 11:25 AM   cc: The Patient  and Clearance Coots, MD

## 2014-06-04 NOTE — Patient Instructions (Addendum)
No polyps or cancer seen today. You do have a condition called diverticulosis - common and not usually a problem. Please read the handout provided.  Next routine colonoscopy in 5 years - 2020, since your mom had colon cancer.  I appreciate the opportunity to care for you. Gatha Mayer, MD, [facility]   Discharge instructions given with verbal understanding. Handout on diverticulosis. Resume previous medications. YOU HAD AN ENDOSCOPIC PROCEDURE TODAY AT [facility]: Refer to the procedure report that was given to you for any specific questions about what was found during the examination.  If the procedure report does not answer your questions, please call your gastroenterologist to clarify.  If you requested that your care partner not be given the details of your procedure findings, then the procedure report has been included in a sealed envelope for you to review at your convenience later.  YOU SHOULD EXPECT: Some feelings of bloating in the abdomen. Passage of more gas than usual.  Walking can help get rid of the air that was put into your GI tract during the procedure and reduce the bloating. If you had a lower endoscopy (such as a colonoscopy or flexible sigmoidoscopy) you may notice spotting of blood in your stool or on the toilet paper. If you underwent a bowel prep for your procedure, then you may not have a normal bowel movement for a few days.  DIET: Your first meal following the procedure should be a light meal and then it is ok to progress to your normal diet.  A half-sandwich or bowl of soup is an example of a good first meal.  Heavy or fried foods are harder to digest and may make you feel nauseous or bloated.  Likewise meals heavy in dairy and vegetables can cause extra gas to form and this can also increase the bloating.  Drink plenty of fluids but you should avoid alcoholic beverages for 24 hours.  ACTIVITY: Your care partner should take you home directly after the  procedure.  You should plan to take it easy, moving slowly for the rest of the day.  You can resume normal activity the day after the procedure however you should NOT DRIVE or use heavy machinery for 24 hours (because of the sedation medicines used during the test).    SYMPTOMS TO REPORT IMMEDIATELY: A gastroenterologist can be reached at any hour.  During normal business hours, 8:30 AM to 5:00 PM Monday through Friday, call [phone].  After hours and on weekends, please call the GI answering service at [phone] who will take a message and have the physician on call contact you.   Following lower endoscopy (colonoscopy or flexible sigmoidoscopy):  Excessive amounts of blood in the stool  Significant tenderness or worsening of abdominal pains  Swelling of the abdomen that is new, acute  Fever of 100F or higher  FOLLOW UP: If any biopsies were taken you will be contacted by phone or by letter within the next 1-3 weeks.  Call your gastroenterologist if you have not heard about the biopsies in 3 weeks.  Our staff will call the home number listed on your records the next business day following your procedure to check on you and address any questions or concerns that you may have at that time regarding the information given to you following your procedure. This is a courtesy call and so if there is no answer at the home number and we have not heard from you through the  emergency physician on call, we will assume that you have returned to your regular daily activities without incident.  SIGNATURES/CONFIDENTIALITY: You and/or your care partner have signed paperwork which will be entered into your electronic medical record.  These signatures attest to the fact that that the information above on your After Visit Summary has been reviewed and is understood.  Full responsibility of the confidentiality of this discharge information lies with you and/or your care-partner.

## 2014-06-04 NOTE — Progress Notes (Signed)
Pt stable to RR 

## 2014-06-05 ENCOUNTER — Telehealth: Payer: Self-pay

## 2014-06-05 NOTE — Telephone Encounter (Signed)
Left a message at [phone] for the pt to call back if any questions or concerns. Maw

## 2014-07-11 NOTE — Telephone Encounter (Signed)
Close Encounter 

## 2014-08-16 ENCOUNTER — Ambulatory Visit: Payer: Medicare Other | Admitting: Family Medicine

## 2014-09-27 ENCOUNTER — Encounter (HOSPITAL_COMMUNITY): Payer: Self-pay | Admitting: Emergency Medicine

## 2014-09-27 ENCOUNTER — Emergency Department (HOSPITAL_COMMUNITY)
Admission: EM | Admit: 2014-09-27 | Discharge: 2014-09-27 | Disposition: A | Discharge status: Home / Self Care | Payer: Medicare Other | Source: Home / Self Care

## 2014-09-27 DIAGNOSIS — J069 Acute upper respiratory infection, unspecified: Secondary | ICD-10-CM

## 2014-09-27 MED ORDER — GUAIFENESIN-CODEINE 100-10 MG/5ML PO SYRP: 5.0000 mL | ORAL_SOLUTION | Freq: Three times a day (TID) | ORAL | Status: DC | PRN

## 2014-09-27 MED ORDER — IPRATROPIUM BROMIDE 0.06 % NA SOLN: 2.0000 | Freq: Four times a day (QID) | NASAL | Status: DC

## 2014-09-27 NOTE — ED Provider Notes (Signed)
Medical screening examination/treatment/procedure(s) were performed by resident physician or non-physician practitioner and as supervising physician I was immediately available for consultation/collaboration.   Pauline Good MD.   Billy Fischer, MD 09/27/14 1052

## 2014-09-27 NOTE — ED Provider Notes (Signed)
CSN: [id number]     Arrival date & time 09/27/14  7124 History   First MD Initiated Contact with Patient 09/27/14 [phone]     Chief Complaint  Patient presents with  . Cough   (Consider location/radiation/quality/duration/timing/severity/associated sxs/prior Treatment) HPI Comments: C/O cold sx's for 3 days.   Past Medical History  Diagnosis Date  . Hypertension   . CAD (coronary artery disease)     a. reported hx of PCI in 2000 (?records);  b.  Myoview 5/10:  EF 71%, no ischemia  . HLD (hyperlipidemia)   . Hypothyroidism   . SVD (spontaneous vaginal delivery)     x 5  . Atrial fibrillation     Echo 5/11:  EF 65%, NL diast fxn  . Arthritis     wrist, hands - tx with otc meds  . Postmenopausal bleeding 12/07/2012  . MI, old 58  . Personal history of colonic adenoma 11/11/2004   Past Surgical History  Procedure Laterality Date  . Tubal ligation  1974  . Dilation and curettage of uterus  2013    hysteroscopy  . Joint replacement Left 2010    left hip   . Hysteroscopy w/d&c  12/07/2012    Procedure: DILATATION AND CURETTAGE /HYSTEROSCOPY;  Surgeon: Elveria Royals, MD;  Location: [facility];  Service: Gynecology;  Laterality: N/A;  Dilitation and curettage /hysteroscopy/biopsey of lower segment of uterus   Family History  Problem Relation Age of Onset  . Diabetes Mother   . Heart disease Mother   . Colon cancer Mother 49  . Heart disease Father   . Cancer Brother     intestinal  . Cancer Brother     throat ca  . Cancer Brother     bladder  . Cancer Sister     Lung   History  Substance Use Topics  . Smoking status: Former Smoker -- 1.50 packs/day for 15 years    Types: Cigarettes    Quit date: 11/09/1981  . Smokeless tobacco: Never Used  . Alcohol Use: 1.8 oz/week    3 Cans of beer per week     Comment: weekly   OB History   Grav Para Term Preterm Abortions TAB SAB Ect Mult Living                 Review of Systems  Constitutional: Positive for fever and  activity change. Negative for chills, appetite change and fatigue.  HENT: Positive for congestion, postnasal drip and rhinorrhea. Negative for ear pain, facial swelling and sore throat.   Eyes: Negative.   Respiratory: Positive for cough. Negative for chest tightness.   Cardiovascular: Negative.  Negative for leg swelling.  Gastrointestinal: Negative.   Genitourinary: Negative.   Musculoskeletal: Negative for neck pain and neck stiffness.  Skin: Negative for pallor and rash.  Neurological: Negative.     Allergies  Penicillins and Percocet  Home Medications   Prior to Admission medications   Medication Sig Start Date End Date Taking? Authorizing Provider  aspirin EC 81 MG tablet Take 1 tablet (81 mg total) by mouth daily. 02/15/14   Minus Breeding, MD  atenolol (TENORMIN) 50 MG tablet Take 0.5 tablets (25 mg total) by mouth every evening. 04/23/14   Rosemarie Ax, MD  atorvastatin (LIPITOR) 80 MG tablet Take 1 tablet (80 mg total) by mouth daily. 05/28/14   Timmothy Euler, MD  clotrimazole (LOTRIMIN) 1 % cream Apply 1 application topically 2 (two) times daily. 05/28/14   Sherley Bounds  Wendi Snipes, MD  diltiazem (CARDIZEM CD) 120 MG 24 hr capsule Take 120 mg by mouth daily.    Historical Provider, MD  guaiFENesin-codeine (CHERATUSSIN AC) 100-10 MG/5ML syrup Take 5 mLs by mouth 3 (three) times daily as needed for cough. 09/27/14   Janne Napoleon, NP  ibuprofen (ADVIL,MOTRIN) 200 MG tablet Take 400 mg by mouth every 6 (six) hours as needed for pain.    Historical Provider, MD  ipratropium (ATROVENT) 0.06 % nasal spray Place 2 sprays into both nostrils 4 (four) times daily. Prn runny nose 09/27/14   Janne Napoleon, NP  levothyroxine (SYNTHROID, LEVOTHROID) 125 MCG tablet Take 1 tablet (125 mcg total) by mouth daily. 01/24/14   Rosemarie Ax, MD  traMADol (ULTRAM) 50 MG tablet Take 1 tablet (50 mg total) by mouth every 6 (six) hours as needed for pain. 05/31/13   Adlih Moreno-Coll, MD   BP 131/84  Pulse 84   Temp(Src) 97.6 F (36.4 C) (Oral)  Resp 17  SpO2 97% Physical Exam  Nursing note and vitals reviewed. Constitutional: She is oriented to person, place, and time. She appears well-developed and well-nourished. No distress.  HENT:  Mouth/Throat: No oropharyngeal exudate.  Bilat TM's normal OP with minor erythema and clear PND  Eyes: Conjunctivae and EOM are normal.  Neck: Normal range of motion. Neck supple.  Cardiovascular: Normal rate, regular rhythm and normal heart sounds.   Pulmonary/Chest: Effort normal and breath sounds normal. No respiratory distress. She has no wheezes. She has no rales.  Musculoskeletal: Normal range of motion. She exhibits no edema.  Lymphadenopathy:    She has no cervical adenopathy.  Neurological: She is alert and oriented to person, place, and time.  Skin: Skin is warm and dry. No rash noted.  Psychiatric: She has a normal mood and affect.    ED Course  Procedures (including critical care time) Labs Review Labs Reviewed - No data to display  Imaging Review No results found.   MDM   1. URI (upper respiratory infection)    atrovent nasal spray cheratussin AC for cough   Lots of water Multiple vitamins Allegra 60 mg twice a day Use lots of nasal saline frequently Tylenol for aches or fever Return for worsening, fevers, trouble breathing or other problems.     Janne Napoleon, NP 09/27/14 1008

## 2014-09-27 NOTE — Discharge Instructions (Signed)
Upper Respiratory Infection, Adult Lots of water Multiple vitamins Allegra 60 mg twice a day Use lots of nasal saline frequently Tylenol for aches or fever  An upper respiratory infection (URI) is also known as the common cold. It is often caused by a type of germ (virus). Colds are easily spread (contagious). You can pass it to others by kissing, coughing, sneezing, or drinking out of the same glass. Usually, you get better in 1 or 2 weeks.  HOME CARE   Only take medicine as told by your doctor.  Use a warm mist humidifier or breathe in steam from a hot shower.  Drink enough water and fluids to keep your pee (urine) clear or pale yellow.  Get plenty of rest.  Return to work when your temperature is back to normal or as told by your doctor. You may use a face mask and wash your hands to stop your cold from spreading. GET HELP RIGHT AWAY IF:   After the first few days, you feel you are getting worse.  You have questions about your medicine.  You have chills, shortness of breath, or brown or red spit (mucus).  You have yellow or brown snot (nasal discharge) or pain in the face, especially when you bend forward.  You have a fever, puffy (swollen) neck, pain when you swallow, or white spots in the back of your throat.  You have a bad headache, ear pain, sinus pain, or chest pain.  You have a high-pitched whistling sound when you breathe in and out (wheezing).  You have a lasting cough or cough up blood.  You have sore muscles or a stiff neck. MAKE SURE YOU:   Understand these instructions.  Will watch your condition.  Will get help right away if you are not doing well or get worse. Document Released: 05/31/2008 Document Revised: 03/06/2012 Document Reviewed: 03/20/2014 [facility] Patient Information 2015 [place], [place]. This information is not intended to replace advice given to you by your health care provider. Make sure you discuss any questions you have with your health  care provider.

## 2014-10-03 ENCOUNTER — Other Ambulatory Visit: Payer: Self-pay | Admitting: *Deleted

## 2014-10-03 DIAGNOSIS — I480 Paroxysmal atrial fibrillation: Secondary | ICD-10-CM

## 2014-10-03 MED ORDER — DILTIAZEM HCL ER COATED BEADS 120 MG PO CP24: 120.0000 mg | ORAL_CAPSULE | Freq: Every day | ORAL | Status: DC

## 2014-10-29 ENCOUNTER — Emergency Department (HOSPITAL_COMMUNITY): Payer: Medicare Other

## 2014-10-29 ENCOUNTER — Observation Stay (HOSPITAL_COMMUNITY)
Admission: EM | Admit: 2014-10-29 | Discharge: 2014-10-30 | Disposition: A | Discharge status: Home / Self Care | Payer: Medicare Other | Attending: Family Medicine | Admitting: Family Medicine

## 2014-10-29 ENCOUNTER — Encounter (HOSPITAL_COMMUNITY): Payer: Self-pay | Admitting: Emergency Medicine

## 2014-10-29 DIAGNOSIS — R05 Cough: Secondary | ICD-10-CM

## 2014-10-29 DIAGNOSIS — R001 Bradycardia, unspecified: Secondary | ICD-10-CM | POA: Diagnosis not present

## 2014-10-29 DIAGNOSIS — G8929 Other chronic pain: Secondary | ICD-10-CM | POA: Diagnosis not present

## 2014-10-29 DIAGNOSIS — I10 Essential (primary) hypertension: Secondary | ICD-10-CM | POA: Insufficient documentation

## 2014-10-29 DIAGNOSIS — E039 Hypothyroidism, unspecified: Secondary | ICD-10-CM

## 2014-10-29 DIAGNOSIS — Z7982 Long term (current) use of aspirin: Secondary | ICD-10-CM | POA: Insufficient documentation

## 2014-10-29 DIAGNOSIS — I4891 Unspecified atrial fibrillation: Secondary | ICD-10-CM

## 2014-10-29 DIAGNOSIS — M25552 Pain in left hip: Secondary | ICD-10-CM | POA: Insufficient documentation

## 2014-10-29 DIAGNOSIS — Z79899 Other long term (current) drug therapy: Secondary | ICD-10-CM | POA: Diagnosis not present

## 2014-10-29 DIAGNOSIS — R42 Dizziness and giddiness: Secondary | ICD-10-CM | POA: Diagnosis not present

## 2014-10-29 DIAGNOSIS — Z87891 Personal history of nicotine dependence: Secondary | ICD-10-CM | POA: Diagnosis not present

## 2014-10-29 DIAGNOSIS — I480 Paroxysmal atrial fibrillation: Secondary | ICD-10-CM | POA: Insufficient documentation

## 2014-10-29 DIAGNOSIS — N390 Urinary tract infection, site not specified: Secondary | ICD-10-CM

## 2014-10-29 DIAGNOSIS — E785 Hyperlipidemia, unspecified: Secondary | ICD-10-CM | POA: Insufficient documentation

## 2014-10-29 DIAGNOSIS — R059 Cough, unspecified: Secondary | ICD-10-CM

## 2014-10-29 DIAGNOSIS — R55 Syncope and collapse: Secondary | ICD-10-CM | POA: Diagnosis present

## 2014-10-29 DIAGNOSIS — I2510 Atherosclerotic heart disease of native coronary artery without angina pectoris: Secondary | ICD-10-CM | POA: Insufficient documentation

## 2014-10-29 LAB — COMPREHENSIVE METABOLIC PANEL
ALT: 15 U/L (ref 0–35)
AST: 19 U/L (ref 0–37)
Albumin: 3.7 g/dL (ref 3.5–5.2)
Alkaline Phosphatase: 70 U/L (ref 39–117)
Anion gap: 16 — ABNORMAL HIGH (ref 5–15)
BUN: 19 mg/dL (ref 6–23)
CO2: 23 mEq/L (ref 19–32)
Calcium: 9.6 mg/dL (ref 8.4–10.5)
Chloride: 100 mEq/L (ref 96–112)
Creatinine, Ser: 1.19 mg/dL — ABNORMAL HIGH (ref 0.50–1.10)
GFR calc Af Amer: 54 mL/min — ABNORMAL LOW (ref >90)
GFR calc non Af Amer: 47 mL/min — ABNORMAL LOW (ref >90)
Glucose, Bld: 106 mg/dL — ABNORMAL HIGH (ref 70–99)
Potassium: 4.8 mEq/L (ref 3.7–5.3)
Sodium: 139 mEq/L (ref 137–147)
Total Bilirubin: 0.4 mg/dL (ref 0.3–1.2)
Total Protein: 8.1 g/dL (ref 6.0–8.3)

## 2014-10-29 LAB — CBC WITH DIFFERENTIAL/PLATELET
Basophils Absolute: 0 10*3/uL (ref 0.0–0.1)
Basophils Relative: 0 % (ref 0–1)
Eosinophils Absolute: 0.2 10*3/uL (ref 0.0–0.7)
Eosinophils Relative: 2 % (ref 0–5)
HCT: 43 % (ref 36.0–46.0)
Hemoglobin: 14.8 g/dL (ref 12.0–15.0)
Lymphocytes Relative: 27 % (ref 12–46)
Lymphs Abs: 2.5 10*3/uL (ref 0.7–4.0)
MCH: 31 pg (ref 26.0–34.0)
MCHC: 34.4 g/dL (ref 30.0–36.0)
MCV: 90.1 fL (ref 78.0–100.0)
Monocytes Absolute: 0.7 10*3/uL (ref 0.1–1.0)
Monocytes Relative: 8 % (ref 3–12)
Neutro Abs: 6 10*3/uL (ref 1.7–7.7)
Neutrophils Relative %: 63 % (ref 43–77)
Platelets: 222 10*3/uL (ref 150–400)
RBC: 4.77 MIL/uL (ref 3.87–5.11)
RDW: 12.7 % (ref 11.5–15.5)
WBC: 9.4 10*3/uL (ref 4.0–10.5)

## 2014-10-29 LAB — URINE MICROSCOPIC-ADD ON

## 2014-10-29 LAB — PROTIME-INR
INR: 1.1 (ref 0.00–1.49)
Prothrombin Time: 14.3 seconds (ref 11.6–15.2)

## 2014-10-29 LAB — RAPID URINE DRUG SCREEN, HOSP PERFORMED
Amphetamines: NOT DETECTED
Barbiturates: NOT DETECTED
Benzodiazepines: NOT DETECTED
Cocaine: NOT DETECTED
Opiates: NOT DETECTED
Tetrahydrocannabinol: NOT DETECTED

## 2014-10-29 LAB — URINALYSIS, ROUTINE W REFLEX MICROSCOPIC
Bilirubin Urine: NEGATIVE
Glucose, UA: NEGATIVE mg/dL
Hgb urine dipstick: NEGATIVE
Ketones, ur: NEGATIVE mg/dL
Nitrite: NEGATIVE
Protein, ur: NEGATIVE mg/dL
Specific Gravity, Urine: 1.026 (ref 1.005–1.030)
Urobilinogen, UA: 0.2 mg/dL (ref 0.0–1.0)
pH: 5 (ref 5.0–8.0)

## 2014-10-29 LAB — TROPONIN I
Troponin I: <0.3 ng/mL (ref <0.30)
Troponin I: <0.3 ng/mL (ref <0.30)

## 2014-10-29 LAB — ETHANOL: Alcohol, Ethyl (B): <11 mg/dL (ref 0–11)

## 2014-10-29 LAB — APTT: aPTT: 32 seconds (ref 24–37)

## 2014-10-29 MED ORDER — HEPARIN SODIUM (PORCINE) 5000 UNIT/ML IJ SOLN
5000.0000 [IU] | Freq: Three times a day (TID) | INTRAMUSCULAR | Status: DC
  Administered 2014-10-29 – 2014-10-30 (×2): 5000 [IU] via SUBCUTANEOUS
  Filled 2014-10-29 (×4): qty 1

## 2014-10-29 MED ORDER — DILTIAZEM HCL 30 MG PO TABS
30.0000 mg | ORAL_TABLET | Freq: Four times a day (QID) | ORAL | Status: DC
  Administered 2014-10-29 – 2014-10-30 (×4): 30 mg via ORAL
  Filled 2014-10-29 (×7): qty 1

## 2014-10-29 MED ORDER — ASPIRIN EC 81 MG PO TBEC
81.0000 mg | DELAYED_RELEASE_TABLET | Freq: Every day | ORAL | Status: DC
  Administered 2014-10-29 – 2014-10-30 (×2): 81 mg via ORAL
  Filled 2014-10-29 (×2): qty 1

## 2014-10-29 MED ORDER — CIPROFLOXACIN IN D5W 400 MG/200ML IV SOLN
400.0000 mg | Freq: Once | INTRAVENOUS | Status: AC
  Administered 2014-10-29: 400 mg via INTRAVENOUS
  Filled 2014-10-29: qty 200

## 2014-10-29 MED ORDER — LEVOTHYROXINE SODIUM 125 MCG PO TABS
125.0000 ug | ORAL_TABLET | Freq: Every day | ORAL | Status: DC
  Administered 2014-10-30: 125 ug via ORAL
  Filled 2014-10-29 (×2): qty 1

## 2014-10-29 MED ORDER — CLOTRIMAZOLE 1 % EX CREA
1.0000 | TOPICAL_CREAM | Freq: Two times a day (BID) | CUTANEOUS | Status: DC
Start: 2014-10-29 — End: 2014-10-30
  Filled 2014-10-29: qty 15

## 2014-10-29 MED ORDER — SODIUM CHLORIDE 0.9 % IJ SOLN: 3.0000 mL | Freq: Two times a day (BID) | INTRAMUSCULAR | Status: DC

## 2014-10-29 MED ORDER — ACETAMINOPHEN 650 MG RE SUPP: 650.0000 mg | Freq: Four times a day (QID) | RECTAL | Status: DC | PRN

## 2014-10-29 MED ORDER — BENZONATATE 100 MG PO CAPS
100.0000 mg | ORAL_CAPSULE | Freq: Two times a day (BID) | ORAL | Status: DC | PRN
  Filled 2014-10-29: qty 1

## 2014-10-29 MED ORDER — SODIUM CHLORIDE 0.45 % IV SOLN
INTRAVENOUS | Status: DC
  Administered 2014-10-29: 19:00:00 via INTRAVENOUS

## 2014-10-29 MED ORDER — TRAMADOL HCL 50 MG PO TABS: 50.0000 mg | ORAL_TABLET | Freq: Four times a day (QID) | ORAL | Status: DC | PRN

## 2014-10-29 MED ORDER — MECLIZINE HCL 12.5 MG PO TABS
12.5000 mg | ORAL_TABLET | Freq: Two times a day (BID) | ORAL | Status: DC | PRN
  Filled 2014-10-29: qty 1

## 2014-10-29 MED ORDER — ATORVASTATIN CALCIUM 80 MG PO TABS
80.0000 mg | ORAL_TABLET | Freq: Every day | ORAL | Status: DC
  Administered 2014-10-29: 80 mg via ORAL
  Filled 2014-10-29 (×2): qty 1

## 2014-10-29 MED ORDER — ACETAMINOPHEN 325 MG PO TABS: 650.0000 mg | ORAL_TABLET | Freq: Four times a day (QID) | ORAL | Status: DC | PRN

## 2014-10-29 NOTE — ED Notes (Signed)
Patient returned from X-ray 

## 2014-10-29 NOTE — ED Notes (Signed)
Pt transported to CT ?

## 2014-10-29 NOTE — ED Notes (Signed)
Physician at the bedside.

## 2014-10-29 NOTE — ED Notes (Signed)
Pt states was at work and  And everything got black  She leaned agaginst the wall and held on to sink, did not pass out did not hit head is not on blood thinners states has had URI for a month and went to ucc for that a while back

## 2014-10-29 NOTE — ED Notes (Signed)
Patient is not in room upon rounding.

## 2014-10-29 NOTE — ED Provider Notes (Signed)
CSN: [id number]     Arrival date & time 10/29/14  1124 History   First MD Initiated Contact with Patient 10/29/14 1251     Chief Complaint  Patient presents with  . Near Syncope     Patient is a 63 y.o. female presenting with near-syncope. The history is provided by the patient.  Near Syncope This is a new problem. The current episode started 1 to 2 hours ago. The problem occurs constantly. The problem has been gradually improving. Pertinent negatives include no chest pain and no shortness of breath. The symptoms are aggravated by standing. The symptoms are relieved by rest.  Patient presents from work for possible near syncopal episode She reports while at work she had mild lightheadedness then her vision "went black" and she had to sit down.  The total episode lasted for about a minute She did not have full LOC She reports she began to improve soon after She now has vertigo anytime that she moves in bed.   No cp/sob.  No visual loss.  No focal weakness.    She reports recent cough, but no vomiting/diarrhea No recent SOB is reported  Past Medical History  Diagnosis Date  . Hypertension   . CAD (coronary artery disease)     a. reported hx of PCI in 2000 (?records);  b.  Myoview 5/10:  EF 71%, no ischemia  . HLD (hyperlipidemia)   . Hypothyroidism   . SVD (spontaneous vaginal delivery)     x 5  . Atrial fibrillation     Echo 5/11:  EF 65%, NL diast fxn  . Arthritis     wrist, hands - tx with otc meds  . Postmenopausal bleeding 12/07/2012  . 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 Personal history of colonic adenoma 11/11/2004   Past Surgical History  Procedure Laterality Date  . Tubal ligation  1974  . Dilation and curettage of uterus  2013    hysteroscopy  . Joint replacement Left 2010    left hip   . Hysteroscopy w/d&c  12/07/2012    Procedure: DILATATION AND CURETTAGE /HYSTEROSCOPY;  Surgeon: Elveria Royals, MD;  Location: [facility];  Service: Gynecology;  Laterality: N/A;  Dilitation and  curettage /hysteroscopy/biopsey of lower segment of uterus   Family History  Problem Relation Age of Onset  . Diabetes Mother   . Heart disease Mother   . Colon cancer Mother 64  . Heart disease Father   . Cancer Brother     intestinal  . Cancer Brother     throat ca  . Cancer Brother     bladder  . Cancer Sister     Lung   History  Substance Use Topics  . Smoking status: Former Smoker -- 1.50 packs/day for 15 years    Types: Cigarettes    Quit date: 11/09/1981  . Smokeless tobacco: Never Used  . Alcohol Use: 1.8 oz/week    3 Cans of beer per week     Comment: weekly   OB History    No data available     Review of Systems  Eyes: Negative for visual disturbance.  Respiratory: Negative for shortness of breath.   Cardiovascular: Positive for near-syncope. Negative for chest pain.  Neurological: Positive for dizziness. Negative for seizures.  All other systems reviewed and are negative.     Allergies  Penicillins and Percocet  Home Medications   Prior to Admission medications   Medication Sig Start Date End Date Taking? Authorizing Provider  aspirin EC 81 MG tablet Take 1 tablet (81 mg total) by mouth daily. 02/15/14  Yes Minus Breeding, MD  atenolol (TENORMIN) 50 MG tablet Take 0.5 tablets (25 mg total) by mouth every evening. 04/23/14  Yes Rosemarie Ax, MD  atorvastatin (LIPITOR) 80 MG tablet Take 1 tablet (80 mg total) by mouth daily. 05/28/14  Yes Timmothy Euler, MD  clotrimazole (LOTRIMIN) 1 % cream Apply 1 application topically 2 (two) times daily. Patient taking differently: Apply 1 application topically 2 (two) times daily as needed (for rash).  05/28/14  Yes Timmothy Euler, MD  diltiazem (CARDIZEM CD) 120 MG 24 hr capsule Take 1 capsule (120 mg total) by mouth daily. 10/03/14  Yes Rosemarie Ax, MD  levothyroxine (SYNTHROID, LEVOTHROID) 125 MCG tablet Take 1 tablet (125 mcg total) by mouth daily. 01/24/14  Yes Rosemarie Ax, MD  guaiFENesin-codeine  [facility]) 100-10 MG/5ML syrup Take 5 mLs by mouth 3 (three) times daily as needed for cough. 09/27/14   Janne Napoleon, NP  ibuprofen (ADVIL,MOTRIN) 200 MG tablet Take 400 mg by mouth every 6 (six) hours as needed for pain.    Historical Provider, MD  ipratropium (ATROVENT) 0.06 % nasal spray Place 2 sprays into both nostrils 4 (four) times daily. Prn runny nose 09/27/14   Janne Napoleon, NP  traMADol (ULTRAM) 50 MG tablet Take 1 tablet (50 mg total) by mouth every 6 (six) hours as needed for pain. 05/31/13   Adlih Moreno-Coll, MD   BP 108/60 mmHg  Pulse 60  Temp(Src) 97.7 F (36.5 C) (Oral)  Resp 17  SpO2 95% Physical Exam CONSTITUTIONAL: Well developed/well nourished HEAD: Normocephalic/atraumatic EYES: EOMI/PERRL, no nystagmus,no ptosis ENMT: Mucous membranes moist NECK: supple no meningeal signs, no bruits CV: S1/S2 noted, no murmurs/rubs/gallops noted LUNGS: Lungs are clear to auscultation bilaterally, no apparent distress ABDOMEN: soft, nontender, no rebound or guarding GU:no cva tenderness NEURO:Awake/alert, facies symmetric, no arm or leg drift is noted Equal 5/5 strength with shoulder abduction, elbow flex/extension, wrist flex/extension in upper extremities and equal hand grips bilaterally Equal 5/5 strength with hip flexion,knee flex/extension, foot dorsi/plantar flexion Cranial nerves 3/4/5/6/07/04/09/11/12 tested and intact No past pointing Sensation to light touch intact in all extremities EXTREMITIES: pulses normal, full ROM SKIN: warm, color normal PSYCH: no abnormalities of mood noted  ED Course  Procedures   4:01 PM Pt improved However she does have UTI Also, pt with near syncope with associated vertigo (she now reports this improving) Could have cardiogenic source of near syncope I feel she would benefit from monitoring as inpatient for further evaluation and treatment Pt agreeable with plan D/w family medicine will admit  tPA in stroke considered but not given  due to:  Symptoms resolved    Labs Review Labs Reviewed  COMPREHENSIVE METABOLIC PANEL - Abnormal; Notable for the following:    Glucose, Bld 106 (*)    Creatinine, Ser 1.19 (*)    GFR calc non Af Amer 47 (*)    GFR calc Af Amer 54 (*)    Anion gap 16 (*)    All other components within normal limits  URINALYSIS, ROUTINE W REFLEX MICROSCOPIC - Abnormal; Notable for the following:    APPearance CLOUDY (*)    Leukocytes, UA MODERATE (*)    All other components within normal limits  URINE MICROSCOPIC-ADD ON - Abnormal; Notable for the following:    Bacteria, UA MANY (*)    Casts HYALINE CASTS (*)    All other components  within normal limits  CBC WITH DIFFERENTIAL  ETHANOL  PROTIME-INR  APTT  URINE RAPID DRUG SCREEN (HOSP PERFORMED)  TROPONIN I    Imaging Review Dg Chest 2 View  10/29/2014   CLINICAL DATA:  Cough. Near syncope yesterday. Lower chest soreness.  EXAM: CHEST  2 VIEW  COMPARISON:  12/07/2010  FINDINGS: The patient has new cardiomegaly. There is also new peribronchial thickening. Pulmonary vascularity is normal. No effusions. No acute osseous abnormality.  IMPRESSION: 1. Bronchitic changes. 2. New slight cardiomegaly.   Electronically Signed   By: Rozetta Nunnery M.D.   On: 10/29/2014 14:30     EKG Interpretation   Date/Time:  Tuesday October 29 2014 11:30:18 EST Ventricular Rate:  66 PR Interval:  140 QRS Duration: 82 QT Interval:  428 QTC Calculation: 448 R Axis:   86 Text Interpretation:  Normal sinus rhythm Normal ECG pt is now in sinus  rhythm compared to prior Confirmed by Christy Gentles  MD, [place] ([id number]) on  10/29/2014 12:59:30 PM      MDM   Final diagnoses:  Near syncope  Vertigo  UTI (lower urinary tract infection)    Nursing notes including past medical history and social history reviewed and considered in documentation xrays reviewed and considered Labs/vital reviewed and considered     Sharyon Cable, MD 10/29/14 1603

## 2014-10-29 NOTE — ED Notes (Signed)
Patient not in the room upon rounding.

## 2014-10-29 NOTE — H&P (Signed)
[facility] Admission History and Physical Service Pager: [phone]  Patient name: [name] Medical record number: [id number] Date of birth: [birth year] Age: 63 y.o. Gender: female  Primary Care Provider: Rosemarie Ax, MD Consultants: none Code Status: full code  Chief Complaint: lightheadedness  Assessment and Plan: [name] is a 63 y.o. female presenting with presyncope . PMH is significant for paroxysmal atrial fibrillation, CAD, h/o MI, HTN, HLD, Hypothyroidism.   #Presyncope: Bradycardic to high 50s in the ED. Troponin neg x1 in the ED. EKG NSR. Echo (04/2010) with EF 65%, normal wall motion, normal diastolic function. Currently resolved symptoms, could be related to transient symptomatic bradycardia (on BB and CCB) vs vertigo vs orthostasis vs vaso-vagal. - Place in observation under FPTS, attending Nori Riis - Cycle troponins x3, repeat EKG in AM - Monitor on telemetry - PT/OT consult - Consider Echo in the AM vs outpatient if persistent symptoms - Orthostatic vital signs (negative VS, but reports some dizziness on standing) - Holding home Atenolol. Will continue Diltiazem as IR 30mg  QID (hold 24 hr tab, but continue IR to avoid RVR)  #Persistent vertigo: Likely BPPV. CT head negative for stroke as cause of persistent vertigo.  No nystagmus on exam. - Meclizine 12.5mg  BID prn, can increase dose for better symptom control - PT/OT consults, recommend vestibular evaluation - Consider MRI in AM if concern for central cause of vertigo.  #Pyuria, without UTI symptoms: UA with mod LE, many bacteria. Patient asymptomatic. S/p 1 dose of IV Cipro in the ED.  - Will hold on further abx therapy, given absence of symptoms - f/u UCx  #Elevated Creatinine: Baseline Cr 0.8-0.9. Creatinine 1.19 on admission.  Does not meet AKI criteria. - IVF: 1/2NS @ 52mL/hr - Continue to monitor - Repeat BMET in AM  #Viral URI vs sinusitis: Recently seen in Urgent  care on 10/2 and diagnosed with viral URI.  Patient continues to report lingering symptoms x 1 month; CT Head with abnormally thick nasopharyngeal tissue. - Tessalon pearls prn for cough - Consider outpatient ENT f/u vs abx course for sinusitis.  #CAD, h/o MI, HTN, HLD, paroxysmal afib: Currently in sinus rhythm, though bradycardic in ED to high 50s. - Continue home aspirin, lipitor 80mg  daily - Hold home Atenolol as above - Continue home diltiazem as immediate release while inpatient  #Hypothyroidism: TSH (03/2014) 4.410. - Continue home synthroid 162mcg daily - f/u TSH  #Chronic hip pain s/p hip replacement: Stable. No current complaints. - Continue home Tramadol 50mg  q6h prn  FEN/GI: Heart healthy diet, 1/2 NS @ 32mL/hr Prophylaxis: subq heparin  Disposition: Place in observation under FPTS, attending Nori Riis.  History of Present Illness: [name] is a 63 y.o. female presenting with presyncopal episode. PMH significant for paroxysmal atrial fibrillation, CAD, h/o MI, HTN, HLD, Hypothyroidism.  Patient was at work at a [employer] standing at the sink when she started to feel lightheaded and her vision went black.  She held onto the sink and called for help.  Her coworker lowered her into a chair.  She denies any LOC or fall.  After sitting down, she felt mostly better but was still slightly lightheaded and felt as though the room was spinning.  Her husband picked her up from work and took her to the ED.    She still feels vertiginous when sitting up, are more so when standing up.  Denies any CP, SOB, palpitations, dysuria, hematuria, urinary freq, abd pain, fever/chills.   Review  Of Systems: Per HPI with the following additions: cough, rhinnorhea, HA, sneezing, watery eyes (recently diagnosed with viral URI) Otherwise 12 point review of systems was performed and was unremarkable.  Patient Active Problem List   Diagnosis Date Noted  . Near syncope 10/29/2014  . Family history of colon  cancer - mother in 47's 06/04/2014  . Tinea cruris 05/28/2014  . Blood glucose elevated 04/11/2014  . Atrial fibrillation 12/18/2013  . Endometrial polyp 12/15/2012  . Postmenopausal bleeding 12/07/2012  . URI (upper respiratory infection) 06/23/2012  . Depression, major 04/19/2012  . Wrist pain, right 02/21/2012  . Hearing loss secondary to cerumen impaction 02/21/2012  . Maculopapular rash, generalized 09/15/2011  . Impaired fasting blood sugar 05/14/2011  . Colon polyps 05/06/2011  . Physical exam, annual 05/06/2011  . Postmenopausal bleeding 05/06/2011  . Vulvar dermatitis 05/06/2011  . 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 DEGENERATIVE JOINT DISEASE, LEFT HIP 07/20/2010  . HYPERLIPIDEMIA 04/29/2009  . OBESITY, UNSPECIFIED 04/29/2009  . ESSENTIAL HYPERTENSION, BENIGN 04/29/2009  . CAD 04/29/2009  . HIP PAIN, BILATERAL 09/13/2008  . HYPOTHYROIDISM, UNSPECIFIED 02/23/2007  . MYOCARDIAL INFARCTION, OLD 02/23/2007  . Personal history of colonic adenoma 11/11/2004   Past Medical History: Past Medical History  Diagnosis Date  . Hypertension   . CAD (coronary artery disease)     a. reported hx of PCI in 2000 (?records);  b.  Myoview 5/10:  EF 71%, no ischemia  . HLD (hyperlipidemia)   . Hypothyroidism   . SVD (spontaneous vaginal delivery)     x 5  . Atrial fibrillation     Echo 5/11:  EF 65%, NL diast fxn  . Arthritis     wrist, hands - tx with otc meds  . Postmenopausal bleeding 12/07/2012  . MI, old 13  . Personal history of colonic adenoma 11/11/2004   Past Surgical History: Past Surgical History  Procedure Laterality Date  . Tubal ligation  1974  . Dilation and curettage of uterus  2013    hysteroscopy  . Joint replacement Left 2010    left hip   . Hysteroscopy w/d&c  12/07/2012    Procedure: DILATATION AND CURETTAGE /HYSTEROSCOPY;  Surgeon: Elveria Royals, MD;  Location: [facility];  Service: Gynecology;  Laterality: N/A;  Dilitation  and curettage /hysteroscopy/biopsey of lower segment of uterus   Social History: History  Substance Use Topics  . Smoking status: Former Smoker -- 1.50 packs/day for 15 years    Types: Cigarettes    Quit date: 11/09/1981  . Smokeless tobacco: Never Used  . Alcohol Use: 1.8 oz/week    3 Cans of beer per week     Comment: weekly   Additional social history: former smoker (quit 25 years ago) - 1.5 packs H20NOB, no EtOH, illicit drugs  Please also refer to relevant sections of EMR.  Family History: Family History  Problem Relation Age of Onset  . Diabetes Mother   . Heart disease Mother   . Colon cancer Mother 69  . Heart disease Father   . Cancer Brother     intestinal  . Cancer Brother     throat ca  . Cancer Brother     bladder  . Cancer Sister     Lung   Allergies and Medications: Allergies  Allergen Reactions  . Penicillins Itching  . Percocet [Oxycodone-Acetaminophen] Itching   No current facility-administered medications on file prior to encounter.   Current Outpatient Prescriptions on File Prior to Encounter  Medication Sig Dispense Refill  . aspirin EC 81 MG tablet Take 1 tablet (81 mg total) by mouth daily.    Marland Kitchen atenolol (TENORMIN) 50 MG tablet Take 0.5 tablets (25 mg total) by mouth every evening. 90 tablet 1  . atorvastatin (LIPITOR) 80 MG tablet Take 1 tablet (80 mg total) by mouth daily. 90 tablet 3  . clotrimazole (LOTRIMIN) 1 % cream Apply 1 application topically 2 (two) times daily. (Patient taking differently: Apply 1 application topically 2 (two) times daily as needed (for rash). ) 60 g 0  . diltiazem (CARDIZEM CD) 120 MG 24 hr capsule Take 1 capsule (120 mg total) by mouth daily. 90 capsule 1  . levothyroxine (SYNTHROID, LEVOTHROID) 125 MCG tablet Take 1 tablet (125 mcg total) by mouth daily. 90 tablet 3  . guaiFENesin-codeine (CHERATUSSIN AC) 100-10 MG/5ML syrup Take 5 mLs by mouth 3 (three) times daily as needed for cough. 120 mL 0  . ibuprofen  (ADVIL,MOTRIN) 200 MG tablet Take 400 mg by mouth every 6 (six) hours as needed for pain.    Marland Kitchen ipratropium (ATROVENT) 0.06 % nasal spray Place 2 sprays into both nostrils 4 (four) times daily. Prn runny nose 15 mL 12  . traMADol (ULTRAM) 50 MG tablet Take 1 tablet (50 mg total) by mouth every 6 (six) hours as needed for pain. 15 tablet 0  . [DISCONTINUED] diltiazem (DILACOR XR) 120 MG 24 hr capsule Take 1 capsule (120 mg total) by mouth daily. 30 capsule 6    Objective: BP 135/59 mmHg  Pulse 61  Temp(Src) 98.5 F (36.9 C) (Oral)  Resp 16  Ht 5\' 3"  (1.6 m)  Wt 193 lb 4.8 oz (87.68 kg)  BMI 34.25 kg/m2  SpO2 98% Exam: General: Laying in bed in NAD HEENT: NCAT, MMM, PERRL, EOMI, no nystagmus Cardiovascular: Bradycardic, RR, no m/r/g, 2+ DP pulses b/l Respiratory: CTAB, no w/r/c. Normal WOB Abdomen: Soft, NTND, +BS Extremities: Trace edema b/l, no calf tenderness Skin: No rashes/cyanosis Neuro: A&Ox3, CN 2-12 intact, strength 5/5 in b/l UE/LEs, sensation grossly intact to light touch, gait deferred.  Labs and Imaging: CBC BMET   Recent Labs Lab 10/29/14 1135  WBC 9.4  HGB 14.8  HCT 43.0  PLT 222    Recent Labs Lab 10/29/14 1135  NA 139  K 4.8  CL 100  CO2 23  BUN 19  CREATININE 1.19*  GLUCOSE 106*  CALCIUM 9.6     Urinalysis    Component Value Date/Time   COLORURINE YELLOW 10/29/2014 1427   APPEARANCEUR CLOUDY* 10/29/2014 1427   LABSPEC 1.026 10/29/2014 1427   PHURINE 5.0 10/29/2014 1427   GLUCOSEU NEGATIVE 10/29/2014 1427   HGBUR NEGATIVE 10/29/2014 1427   BILIRUBINUR NEGATIVE 10/29/2014 1427   BILIRUBINUR NEG 05/28/2014 0903   KETONESUR NEGATIVE 10/29/2014 1427   PROTEINUR NEGATIVE 10/29/2014 1427   PROTEINUR NEG 05/28/2014 0903   UROBILINOGEN 0.2 10/29/2014 1427   UROBILINOGEN 0.2 05/28/2014 0903   NITRITE NEGATIVE 10/29/2014 1427   NITRITE NEG 05/28/2014 0903   LEUKOCYTESUR MODERATE* 10/29/2014 1427    Drugs of Abuse     Component Value  Date/Time   LABOPIA NONE DETECTED 10/29/2014 1427   COCAINSCRNUR NONE DETECTED 10/29/2014 1427   LABBENZ NONE DETECTED 10/29/2014 1427   AMPHETMU NONE DETECTED 10/29/2014 1427   THCU NONE DETECTED 10/29/2014 1427   LABBARB NONE DETECTED 10/29/2014 1427     CXR: (11/3): 1. Bronchitic changes. 2. New slight cardiomegaly.  CT Head (11/3): 1. No acute intracranial  findings. 2. Abnormal nasopharyngeal soft tissue, causing bilateral mastoid disease. Recommend ENT referral for visualization. 3. Chronic small vessel disease.  EKG (11/3): NSR, no ischemia, unchanged from previous    Lavon Paganini, MD 10/29/2014, 7:07 PM PGY-1 [place] Intern pager: [phone], text pages welcome  Upper Level Addendum:  I have seen and evaluated this patient along with Dr. Brita Romp and reviewed the above note, making necessary revisions in purple.

## 2014-10-30 DIAGNOSIS — R001 Bradycardia, unspecified: Secondary | ICD-10-CM | POA: Diagnosis not present

## 2014-10-30 DIAGNOSIS — N390 Urinary tract infection, site not specified: Secondary | ICD-10-CM | POA: Insufficient documentation

## 2014-10-30 DIAGNOSIS — R55 Syncope and collapse: Secondary | ICD-10-CM | POA: Diagnosis not present

## 2014-10-30 DIAGNOSIS — R42 Dizziness and giddiness: Secondary | ICD-10-CM | POA: Insufficient documentation

## 2014-10-30 DIAGNOSIS — I480 Paroxysmal atrial fibrillation: Secondary | ICD-10-CM | POA: Diagnosis not present

## 2014-10-30 LAB — BASIC METABOLIC PANEL
Anion gap: 11 (ref 5–15)
BUN: 17 mg/dL (ref 6–23)
CO2: 26 mEq/L (ref 19–32)
Calcium: 9 mg/dL (ref 8.4–10.5)
Chloride: 103 mEq/L (ref 96–112)
Creatinine, Ser: 1.02 mg/dL (ref 0.50–1.10)
GFR calc Af Amer: 65 mL/min — ABNORMAL LOW (ref >90)
GFR calc non Af Amer: 56 mL/min — ABNORMAL LOW (ref >90)
Glucose, Bld: 130 mg/dL — ABNORMAL HIGH (ref 70–99)
Potassium: 4.7 mEq/L (ref 3.7–5.3)
Sodium: 140 mEq/L (ref 137–147)

## 2014-10-30 LAB — CBC
HCT: 36.5 % (ref 36.0–46.0)
Hemoglobin: 12.1 g/dL (ref 12.0–15.0)
MCH: 30.3 pg (ref 26.0–34.0)
MCHC: 33.2 g/dL (ref 30.0–36.0)
MCV: 91.3 fL (ref 78.0–100.0)
Platelets: 207 10*3/uL (ref 150–400)
RBC: 4 MIL/uL (ref 3.87–5.11)
RDW: 13 % (ref 11.5–15.5)
WBC: 7.4 10*3/uL (ref 4.0–10.5)

## 2014-10-30 LAB — URINE CULTURE: Colony Count: 40000

## 2014-10-30 LAB — TSH: TSH: 7.82 u[IU]/mL — ABNORMAL HIGH (ref 0.350–4.500)

## 2014-10-30 LAB — TROPONIN I
Troponin I: <0.3 ng/mL (ref <0.30)
Troponin I: <0.3 ng/mL (ref <0.30)

## 2014-10-30 MED ORDER — MECLIZINE HCL 12.5 MG PO TABS: 12.5000 mg | ORAL_TABLET | Freq: Two times a day (BID) | ORAL | Status: DC | PRN

## 2014-10-30 MED ORDER — LEVOTHYROXINE SODIUM 150 MCG PO TABS
150.0000 ug | ORAL_TABLET | Freq: Every day | ORAL | Status: DC
Start: 2014-10-30 — End: 2015-04-08

## 2014-10-30 MED ORDER — ATENOLOL 12.5 MG HALF TABLET: 12.5000 mg | ORAL_TABLET | Freq: Every evening | ORAL | Status: DC

## 2014-10-30 NOTE — Discharge Instructions (Signed)
Atrial Fibrillation °Atrial fibrillation is a type of irregular heart rhythm (arrhythmia). During atrial fibrillation, the upper chambers of the heart (atria) quiver continuously in a chaotic pattern. This causes an irregular and often rapid heart rate.  °Atrial fibrillation is the result of the heart becoming overloaded with disorganized signals that tell it to beat. These signals are normally released one at a time by a part of the right atrium called the sinoatrial node. They then travel from the atria to the lower chambers of the heart (ventricles), causing the atria and ventricles to contract and pump blood as they pass. In atrial fibrillation, parts of the atria outside of the sinoatrial node also release these signals. This results in two problems. First, the atria receive so many signals that they do not have time to fully contract. Second, the ventricles, which can only receive one signal at a time, beat irregularly and out of rhythm with the atria.  °There are three types of atrial fibrillation:  °· Paroxysmal. Paroxysmal atrial fibrillation starts suddenly and stops on its own within a week. °· Persistent. Persistent atrial fibrillation lasts for more than a week. It may stop on its own or with treatment. °· Permanent. Permanent atrial fibrillation does not go away. Episodes of atrial fibrillation may lead to permanent atrial fibrillation. °Atrial fibrillation can prevent your heart from pumping blood normally. It increases your risk of stroke and can lead to heart failure.  °CAUSES  °· Heart conditions, including a heart attack, heart failure, coronary artery disease, and heart valve conditions.   °· Inflammation of the sac that surrounds the heart (pericarditis). °· Blockage of an artery in the lungs (pulmonary embolism). °· Pneumonia or other infections. °· Chronic lung disease. °· Thyroid problems, especially if the thyroid is overactive (hyperthyroidism). °· Caffeine, excessive alcohol use, and use  of some illegal drugs.   °· Use of some medicines, including certain decongestants and diet pills. °· Heart surgery.   °· Birth defects.   °Sometimes, no cause can be found. When this happens, the atrial fibrillation is called lone atrial fibrillation. The risk of complications from atrial fibrillation increases if you have lone atrial fibrillation and you are age 60 years or older. °RISK FACTORS °· Heart failure. °· Coronary artery disease. °· Diabetes mellitus.   °· High blood pressure (hypertension).   °· Obesity.   °· Other arrhythmias.   °· Increased age. °SIGNS AND SYMPTOMS  °· A feeling that your heart is beating rapidly or irregularly.   °· A feeling of discomfort or pain in your chest.   °· Shortness of breath.   °· Sudden light-headedness or weakness.   °· Getting tired easily when exercising.   °· Urinating more often than normal (mainly when atrial fibrillation first begins).   °In paroxysmal atrial fibrillation, symptoms may start and suddenly stop. °DIAGNOSIS  °Your health care provider may be able to detect atrial fibrillation when taking your pulse. Your health care provider may have you take a test called an ambulatory electrocardiogram (ECG). An ECG records your heartbeat patterns over a 24-hour period. You may also have other tests, such as: °· Transthoracic echocardiogram (TTE). During echocardiography, sound waves are used to evaluate how blood flows through your heart. °· Transesophageal echocardiogram (TEE). °· Stress test. There is more than one type of stress test. If a stress test is needed, ask your health care provider about which type is best for you. °· Chest X-ray exam. °· Blood tests. °· Computed tomography (CT). °TREATMENT  °Treatment may include: °· Treating any underlying conditions. For example, if you   have an overactive thyroid, treating the condition may correct atrial fibrillation.  Taking medicine. Medicines may be given to control a rapid heart rate or to prevent blood  clots, heart failure, or a stroke.  Having a procedure to correct the rhythm of the heart:  Electrical cardioversion. During electrical cardioversion, a controlled, low-energy shock is delivered to the heart through your skin. If you have chest pain, very low blood pressure, or sudden heart failure, this procedure may need to be done as an emergency.  Catheter ablation. During this procedure, heart tissues that send the signals that cause atrial fibrillation are destroyed.  Surgical ablation. During this surgery, thin lines of heart tissue that carry the abnormal signals are destroyed. This procedure can either be an open-heart surgery or a minimally invasive surgery. With the minimally invasive surgery, small cuts are made to access the heart instead of a large opening.  Pulmonary venous isolation. During this surgery, tissue around the veins that carry blood from the lungs (pulmonary veins) is destroyed. This tissue is thought to carry the abnormal signals. HOME CARE INSTRUCTIONS   Take medicines only as directed by your health care provider. Some medicines can make atrial fibrillation worse or recur.  If blood thinners were prescribed by your health care provider, take them exactly as directed. Too much blood-thinning medicine can cause bleeding. If you take too little, you will not have the needed protection against stroke and other problems.  Perform blood tests at home if directed by your health care provider. Perform blood tests exactly as directed.  Quit smoking if you smoke.  Do not drink alcohol.  Do not drink caffeinated beverages such as coffee, soda, and some teas. You may drink decaffeinated coffee, soda, or tea.   Maintain a healthy weight.Do not use diet pills unless your health care provider approves. They may make heart problems worse.   Follow diet instructions as directed by your health care provider.  Exercise regularly as directed by your health care  provider.  Keep all follow-up visits as directed by your health care provider. This is important. PREVENTION  The following substances can cause atrial fibrillation to recur:   Caffeinated beverages.  Alcohol.  Certain medicines, especially those used for breathing problems.  Certain herbs and herbal medicines, such as those containing ephedra or ginseng.  Illegal drugs, such as cocaine and amphetamines. Sometimes medicines are given to prevent atrial fibrillation from recurring. Proper treatment of any underlying condition is also important in helping prevent recurrence.  SEEK MEDICAL CARE IF:  You notice a change in the rate, rhythm, or strength of your heartbeat.  You suddenly begin urinating more frequently.  You tire more easily when exerting yourself or exercising. SEEK IMMEDIATE MEDICAL CARE IF:   You have chest pain, abdominal pain, sweating, or weakness.  You feel nauseous.  You have shortness of breath.  You suddenly have swollen feet and ankles.  You feel dizzy.  Your face or limbs feel numb or weak.  You have a change in your vision or speech. MAKE SURE YOU:   Understand these instructions.  Will watch your condition.  Will get help right away if you are not doing well or get worse. Document Released: 12/13/2005 Document Revised: 04/29/2014 Document Reviewed: 01/23/2013 [facility] Patient Information 2015 [place], [place]. This information is not intended to replace advice given to you by your health care provider. Make sure you discuss any questions you have with your health care provider. Bradycardia Bradycardia is a term for  a heart rate (pulse) that, in adults, is slower than 60 beats per minute. A normal rate is 60 to 100 beats per minute. A heart rate below 60 beats per minute may be normal for some adults with healthy hearts. If the rate is too slow, the heart may have trouble pumping the volume of blood the body needs. If the heart rate gets too low,  blood flow to the brain may be decreased and may make you feel lightheaded, dizzy, or faint. The heart has a natural pacemaker in the top of the heart called the SA node (sinoatrial or sinus node). This pacemaker sends out regular electrical signals to the muscle of the heart, telling the heart muscle when to beat (contract). The electrical signal travels from the upper parts of the heart (atria) through the AV node (atrioventricular node), to the lower chambers of the heart (ventricles). The ventricles squeeze, pumping the blood from your heart to your lungs and to the rest of your body. CAUSES   Problem with the heart's electrical system.  Problem with the heart's natural pacemaker.  Heart disease, damage, or infection.  Medications.  Problems with minerals and salts (electrolytes). SYMPTOMS   Fainting (syncope).  Fatigue and weakness.  Shortness of breath (dyspnea).  Chest pain (angina).  Drowsiness.  Confusion. DIAGNOSIS   An electrocardiogram (ECG) can help your caregiver determine the type of slow heart rate you have.  If the cause is not seen on an ECG, you may need to wear a heart monitor that records your heart rhythm for several hours or days.  Blood tests. TREATMENT   Electrolyte supplements.  Medications.  Withholding medication which is causing a slow heart rate.  Pacemaker placement. SEEK IMMEDIATE MEDICAL CARE IF:   You feel lightheaded or faint.  You develop an irregular heart rate.  You feel chest pain or have trouble breathing. MAKE SURE YOU:   Understand these instructions.  Will watch your condition.  Will get help right away if you are not doing well or get worse. Document Released: 09/04/2002 Document Revised: 03/06/2012 Document Reviewed: 03/20/2014 [facility] Patient Information 2015 [place], [place]. This information is not intended to replace advice given to you by your health care provider. Make sure you discuss any questions you have  with your health care provider.

## 2014-10-30 NOTE — Progress Notes (Signed)
Chaplain responded to consult concerning pt wanting info about an advanced directive. She mentioned not wanting "to be on tubes, not wanting to put my family thorough that". AD left with patient to read over. Page if needed  Sherrilee Gilles 10/30/2014

## 2014-10-30 NOTE — Care Management Note (Addendum)
  Page 1 of 1   10/30/2014     1:43:47 PM CARE MANAGEMENT NOTE 10/30/2014  Patient:  TEMPRENCE, RHINES   Account Number:  [id number]  Date Initiated:  10/30/2014  Documentation initiated by:  Mariann Laster  Subjective/Objective Assessment:   near syncope     Action/Plan:   CM to follow for disposition needs   Anticipated DC Date:  10/30/2014   Anticipated DC Plan:  HOME/SELF CARE         Choice offered to / List presented to:             Status of service:  Completed, signed off Medicare Important Message given?  NA - LOS <3 / Initial given by admissions (If response is "NO", the following Medicare IM given date fields will be blank) Date Medicare IM given:   Medicare IM given by:   Date Additional Medicare IM given:   Additional Medicare IM given by:    Discharge Disposition:  HOME/SELF CARE  Per UR Regulation:  Reviewed for med. necessity/level of care/duration of stay  If discussed at [place] of Stay Meetings, dates discussed:    Comments:  Raylyn Carton RN, BSN, MSHL, CCM  Nurse - Case Manager,  ([facility] [place])  [phone]  10/30/2014 OP Vestiblar Therapy referral sent via Epic to [facility] and Vestibular Rehabilitation [phone] phone

## 2014-10-30 NOTE — Discharge Summary (Signed)
[facility] Discharge Summary  Patient name: [name] Medical record number: [id number] Date of birth: Jul 23, 1951 Age: 63 y.o. Gender: female Date of Admission: 10/29/2014  Date of Discharge: 10/30/2014 Admitting Physician: Dickie La, MD  Primary Care Provider: Rosemarie Ax, MD Consultants: None  Indication for Hospitalization:  Presyncope episode  Discharge Diagnoses/Problem List:  Presyncope  Sinus Bradycardia  Persistent vertigo Paroxysmal atrial fibrillation  Coronary artery disease  HTN Hyperlipidemia  Hypothyroidism  Disposition: Home  Discharge Condition: Stable   Discharge Exam:  Filed Vitals:   10/30/14 0250 10/30/14 0332 10/30/14 0452 10/30/14 1118  BP: 122/66  122/65 121/64  Pulse: 63 54 75 76  Temp:   97.5 F (36.4 C)   TempSrc:   Oral   Resp:      Height:      Weight:   193 lb 3.2 oz (87.635 kg)   SpO2:   95% 99%   Gen: Sitting up in bed in NAD  HEENT: Atraumatic, normocephalic. MMM, oropharynx clear,  EOMI without nystagmus  CV: Bradycardic. Regular rhythm. No m/r/g noted. 2+ DP and radial pulses bilaterally Respiratory: CTAB, no wheezing, crackles, or rhonchi noted.  Abdomen: +BS, soft, non-tender, non-distended Extremities: Trace edema bilaterally  Skin: no rashes Neuro: A&O x 4, Face symmetric, strength 5/5 in the UE and LE bilaterally.   Brief Hospital Course:  [name] is a 63 y/o female with PMHx of PAF, CAD, h/o MI, and HTN presenting for a presyncopal episode: while standing at work she became light-headed and her vision went black. She was able to hold onto a sink until her coworkers could help her: no LOC or fall. She continued to feel light-headed and vertiginous when sitting up and presented to the ED. Of note, the pt did have a 1 month h/o cold like symptoms with cough, HA, rhinorrhea and feels her PO intake has been decreased.  In the ED, an EKG was NSR with no signs of ischemia. 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 CXR revealed bronchitic changes.   She was admitted for ACS rule out. Her home atenolol was held and her diltiazem 24hr was converted to diltiazem IR q6hrs. Troponins were negative x 3. Repeat EKG revealed sinus bradycardia with a HR 52 and a QTc of 481. Telemetry revealed sinus bradycardia in the 50s on occasion with no episodes of atrial fibrillation. Additionally, TSH was found to be elevated to 7.820   On the day of discharge the patient was asymptomatic and was able to stand and walk with no issues. PT felt the patient was safe for discharge but recommended outpatient vestibular PT.  It was felt that given the patient's symptomatic bradycardia, she would benefit more from atenolol than diltiazem therefore atenolol was decreased to 12.5mg  daily and her diltiazem was discontinued completely. The patient felt comfortable with this plan and return precautions were dicussed.  Additionally, the patient's warfarin was stopped secondary to vaginal bleeding. An biopsy was performed, however it appears that only the polyp specimen was obtained which would not explain the patient's 99mm endometrial strip noted on transvaginal U/S. She denies any vaginal bleeding since discontinuation of warfarin.   Issues for Follow Up:  -- Atenolol decreased to 12.5mg  daily and diltiazem discontinued due to bradycardia- consider increasing atenolol or re-starting low doses of diltiazem if bradycardia improves -- Patient not on warfarin as she's had vaginal bleeding while she was on  it -- consider a repeat transvaginal U/S as a polyp that was biopsied would not explain her 32mm endometrial strip. -- Consider ENT referral given abnormal nasopharyngeal tissue noted on CT.  -- Repeat TSH in 2-3 months given increase in levothyroxine to 125mcg daily -- F/u with vestibular rehab   Significant Procedures: None  Significant Labs  and Imaging:   Recent Labs Lab 10/29/14 1135 10/30/14 0515  WBC 9.4 7.4  HGB 14.8 12.1  HCT 43.0 36.5  PLT 222 207    Recent Labs Lab 10/29/14 1135 10/30/14 0515  NA 139 140  K 4.8 4.7  CL 100 103  CO2 23 26  GLUCOSE 106* 130*  BUN 19 17  CREATININE 1.19* 1.02  CALCIUM 9.6 9.0  ALKPHOS 70  --   AST 19  --   ALT 15  --   ALBUMIN 3.7  --    Head CT: 1. No acute intracranial findings. 2. Abnormal nasopharyngeal soft tissue, causing bilateral mastoid disease. Recommend ENT referral for visualization. 3. Chronic small vessel disease.'  CXR:  1. Bronchitic changes. 2. New slight cardiomegaly   Results/Tests Pending at Time of Discharge: None  Discharge Medications:    Medication List    STOP taking these medications        diltiazem 120 MG 24 hr capsule  Commonly known as:  CARDIZEM CD      TAKE these medications        aspirin EC 81 MG tablet  Take 1 tablet (81 mg total) by mouth daily.     atenolol 12.5 mg Tabs tablet  Commonly known as:  TENORMIN  Take 0.5 tablets (12.5 mg total) by mouth every evening.     atorvastatin 80 MG tablet  Commonly known as:  LIPITOR  Take 1 tablet (80 mg total) by mouth daily.     clotrimazole 1 % cream  Commonly known as:  LOTRIMIN  Apply 1 application topically 2 (two) times daily.     guaiFENesin-codeine 100-10 MG/5ML syrup  Commonly known as:  CHERATUSSIN AC  Take 5 mLs by mouth 3 (three) times daily as needed for cough.     ibuprofen 200 MG tablet  Commonly known as:  ADVIL,MOTRIN  Take 400 mg by mouth every 6 (six) hours as needed for pain.     ipratropium 0.06 % nasal spray  Commonly known as:  ATROVENT  Place 2 sprays into both nostrils 4 (four) times daily. Prn runny nose     levothyroxine 150 MCG tablet  Commonly known as:  SYNTHROID, LEVOTHROID  Take 1 tablet (150 mcg total) by mouth daily.     meclizine 12.5 MG tablet  Commonly known as:  ANTIVERT  Take 1 tablet (12.5 mg total) by mouth 2  (two) times daily as needed for dizziness.     traMADol 50 MG tablet  Commonly known as:  ULTRAM  Take 1 tablet (50 mg total) by mouth every 6 (six) hours as needed for pain.        Discharge Instructions: Please refer to Patient Instructions section of EMR for full details.  Patient was counseled important signs and symptoms that should prompt return to medical care, changes in medications, dietary instructions, activity restrictions, and follow up appointments.   Follow-Up Appointments: Follow-up Information    Follow up with Rosemarie Ax, MD On 11/08/2014.   Specialty:  Family Medicine   Why:  at 2:15pm    Contact information:   [place]  [id number] [phone]       Follow up with Tarri Fuller, PA-C On 11/08/2014.   Specialty:  Physician Assistant   Why:  at 8:30am    Contact information:   [address] [place] [address] [phone]       Archie Patten, MD 10/30/2014, 12:41 PM PGY-1, [place]

## 2014-10-30 NOTE — Evaluation (Signed)
Physical Therapy Evaluation Patient Details Name: [name] MRN: [id number] DOB: [birth year] Today's Date: 10/30/2014   History of Present Illness  [name] is a 63 y.o. female presenting with presyncope . PMH is significant for paroxysmal atrial fibrillation, CAD, h/o MI, HTN, HLD, Hypothyroidism.  Questionable vertigo.    Clinical Impression  Pt admitted with above. Pt currently without  significant functional limitations at this point and can continue Outpt PT to progress as tolerated at home. Pt agreeable to plan.  No other needs at this time and pt to be discharged today.   Pt will benefit from skilled PT in Outpt to continued to progress.      Follow Up Recommendations Outpatient PT;Supervision - Intermittent (for vestibular rehab)    Equipment Recommendations  None recommended by PT    Recommendations for Other Services       Precautions / Restrictions Precautions Precautions: Fall Restrictions Weight Bearing Restrictions: No      Mobility  Bed Mobility Overal bed mobility: Independent             General bed mobility comments: Pt tested for BPPV with all canals negative.  Head thrust bil negative as well.  Pt appeared to have some difficulty with gaze stabiliation and question whether pt has a slight hypofunction on the right and left with the right being more affected however pt has compensated well and is not significantly impaired at this point.    Transfers Overall transfer level: Independent                  Ambulation/Gait Ambulation/Gait assistance: Independent Ambulation Distance (Feet): 350 Feet Assistive device: None Gait Pattern/deviations: WFL(Within Functional Limits)   Gait velocity interpretation: at or above normal speed for age/gender General Gait Details: Pt overall with good balance.  No LOB with some challenges.  Only has difficulty with high level balance activities.    Stairs Stairs: Yes Stairs assistance: Min  guard Stair Management: One rail Right;Step to pattern;Forwards Number of Stairs: 4 General stair comments: Pt had to do step to pattern on steps but did so without much difficulty.  A little dizziness turning on step.    Wheelchair Mobility    Modified Rankin (Stroke Patients Only)       Balance Overall balance assessment: Needs assistance;History of Falls Sitting-balance support: No upper extremity supported;Feet supported Sitting balance-Leahy Scale: Good     Standing balance support: No upper extremity supported;During functional activity Standing balance-Leahy Scale: Good Standing balance comment: STatic stance good with pt not needing UE support.   Single Leg Stance - Right Leg: 15           High level balance activites: Direction changes;Turns;Sudden stops;Head turns High Level Balance Comments: Does these with supervision only without LOB.  Some dizziness per pt.  Standardized Balance Assessment Standardized Balance Assessment : Dynamic Gait Index   Dynamic Gait Index Level Surface: Normal Change in Gait Speed: Normal Gait with Horizontal Head Turns: Mild Impairment Gait with Vertical Head Turns: Mild Impairment Gait and Pivot Turn: Normal Step Over Obstacle: Mild Impairment Step Around Obstacles: Mild Impairment Steps: Moderate Impairment Total Score: 18       Pertinent Vitals/Pain Pain Assessment: No/denies pain  VSS    Home Living Family/patient expects to be discharged to:: Private residence Living Arrangements: Spouse/significant other Available Help at Discharge: Family;Available PRN/intermittently Type of Home: House Home Access: Level entry     Home Layout: One level Home Equipment: None  Prior Function Level of Independence: Independent         Comments: Pt works 32 hours a week at a [employer].  Son will be at home but is on disability.       Hand Dominance        Extremity/Trunk Assessment   Upper Extremity Assessment: Defer  to OT evaluation           Lower Extremity Assessment: Generalized weakness      Cervical / Trunk Assessment: Normal  Communication   Communication: No difficulties  Cognition Arousal/Alertness: Awake/alert Behavior During Therapy: WFL for tasks assessed/performed Overall Cognitive Status: Within Functional Limits for tasks assessed                      General Comments General comments (skin integrity, edema, etc.): Pt scored 18/24 on DGI suggesting pt is at risk for falls.  At this point do not recommend a device as pt is careful and is aware of her deficits.  Recommend Outpt PT f/u for high level balance therapy and vestibular rehab.       Exercises Other Exercises Other Exercises: Gave pt two exercises - marching in place x1 exercise and walking forward and backward x1 exercise.  Demonstrated to pt and husband and gave handouts.        Assessment/Plan    PT Assessment All further PT needs can be met in the next venue of care  PT Diagnosis Generalized weakness (dizziness)   PT Problem List Decreased balance (dizziness)  PT Treatment Interventions DME instruction;Functional mobility training;Therapeutic activities;Therapeutic exercise;Balance training;Patient/family education;Gait training   PT Goals (Current goals can be found in the Care Plan section) Acute Rehab PT Goals PT Goal Formulation: All assessment and education complete, DC therapy (Pt to continue therapy at Outpt PT)    Frequency     Barriers to discharge        Co-evaluation               End of Session Equipment Utilized During Treatment: Gait belt Activity Tolerance:  (limited by dizziness) Patient left: in chair;with call bell/phone within reach;with family/visitor present Nurse Communication: Mobility status (notified nursing that pt asking about IV.)    Functional Assessment Tool Used: clinical judgment Functional Limitation: Mobility: Walking and moving around Mobility:  Walking and Moving Around Current Status (D6438): At least 1 percent but less than 20 percent impaired, limited or restricted Mobility: Walking and Moving Around Discharge Status [phone]): At least 1 percent but less than 20 percent impaired, limited or restricted    Time: 0910-0955 PT Time Calculation (min): 45 min   Charges:   PT Evaluation $Initial PT Evaluation Tier I: 1 Procedure PT Treatments $Gait Training: 8-22 mins $Therapeutic Activity: 8-22 mins $Physical Performance Test: 8-22 mins   PT G Codes:   Functional Assessment Tool Used: clinical judgment Functional Limitation: Mobility: Walking and moving around    [place], [place] F 10/30/2014, 11:46 AM Lorrie Strauch,PT Acute Rehabilitation [phone] (pager)

## 2014-11-08 ENCOUNTER — Ambulatory Visit (INDEPENDENT_AMBULATORY_CARE_PROVIDER_SITE_OTHER): Payer: Medicare Other | Admitting: Physician Assistant

## 2014-11-08 ENCOUNTER — Ambulatory Visit (INDEPENDENT_AMBULATORY_CARE_PROVIDER_SITE_OTHER): Payer: Medicare Other | Admitting: Family Medicine

## 2014-11-08 ENCOUNTER — Encounter: Payer: Self-pay | Admitting: Physician Assistant

## 2014-11-08 ENCOUNTER — Encounter: Payer: Self-pay | Admitting: Family Medicine

## 2014-11-08 VITALS — BP 130/80 | HR 68 | Temp 98.2°F | Ht 63.0 in | Wt 196.0 lb

## 2014-11-08 DIAGNOSIS — J069 Acute upper respiratory infection, unspecified: Secondary | ICD-10-CM

## 2014-11-08 DIAGNOSIS — R9431 Abnormal electrocardiogram [ECG] [EKG]: Secondary | ICD-10-CM | POA: Insufficient documentation

## 2014-11-08 DIAGNOSIS — R55 Syncope and collapse: Secondary | ICD-10-CM

## 2014-11-08 DIAGNOSIS — E039 Hypothyroidism, unspecified: Secondary | ICD-10-CM

## 2014-11-08 DIAGNOSIS — M545 Low back pain, unspecified: Secondary | ICD-10-CM

## 2014-11-08 DIAGNOSIS — Z23 Encounter for immunization: Secondary | ICD-10-CM

## 2014-11-08 DIAGNOSIS — I480 Paroxysmal atrial fibrillation: Secondary | ICD-10-CM

## 2014-11-08 LAB — MAGNESIUM: Magnesium: 1.8 mg/dL (ref 1.5–2.5)

## 2014-11-08 MED ORDER — APIXABAN 5 MG PO TABS: 5.0000 mg | ORAL_TABLET | Freq: Two times a day (BID) | ORAL | Status: DC

## 2014-11-08 MED ORDER — FLUTICASONE PROPIONATE 50 MCG/ACT NA SUSP: 2.0000 | Freq: Every day | NASAL | Status: DC

## 2014-11-08 NOTE — Progress Notes (Signed)
Subjective:    Patient ID: [name], female    DOB: 07/22/1951, 63 y.o.   MRN: [id number]  HPI  [name] is here for hospital f/u.   She was admitted for near-syncope recently. She feels much better. Was seen by her cardiologist this AM and is wearing a loop recorder.  The atenolol was stopped and placed on coreg. The meclizine seems to be helping her symptoms. Denies any of the same feelings she had prior to admission. She is trying to take it easy since discharge.   UPPER RESPIRATORY INFECTION  Onset: about a month  Course: got better then worse again Better with: ABX while in hospital Meds tried: robitussin  Sick contacts: no  Nasal discharge (color,laterality): yes, b/l  Sinusitis Risk Factors Fever: no   Headache/face pain: yes  Double sickening: yes  Tooth pain: no   Allergy Risk Factors: Sneezing: yes  Itchy scratchy throat: yes  Seasonal sx: yes   Flu Risk Factors Headache: no  Muscle aches: no  Severe fatigue: no    Red Flags  Stiff neck: no  Dyspnea: no  Rash: no  Swallowing difficulty: no    She has felt a "pop" in her back. She has pain on her right lower side. This occurred yesterday. She applied heat and that helped. It occurred while she was getting out of the shower.   Current Outpatient Prescriptions on File Prior to Visit  Medication Sig Dispense Refill  . aspirin EC 81 MG tablet Take 1 tablet (81 mg total) by mouth daily.    Marland Kitchen atorvastatin (LIPITOR) 80 MG tablet Take 1 tablet (80 mg total) by mouth daily. 90 tablet 3  . clotrimazole (LOTRIMIN) 1 % cream Apply 1 application topically 2 (two) times daily. (Patient taking differently: Apply 1 application topically 2 (two) times daily as needed (for rash). ) 60 g 0  . guaiFENesin-codeine (CHERATUSSIN AC) 100-10 MG/5ML syrup Take 5 mLs by mouth 3 (three) times daily as needed for cough. 120 mL 0  . ibuprofen (ADVIL,MOTRIN) 200 MG tablet Take 400 mg by mouth every 6 (six) hours as needed for  pain.    Marland Kitchen ipratropium (ATROVENT) 0.06 % nasal spray Place 2 sprays into both nostrils 4 (four) times daily. Prn runny nose 15 mL 12  . levothyroxine (SYNTHROID, LEVOTHROID) 150 MCG tablet Take 1 tablet (150 mcg total) by mouth daily. 30 tablet 0  . meclizine (ANTIVERT) 12.5 MG tablet Take 1 tablet (12.5 mg total) by mouth 2 (two) times daily as needed for dizziness. 30 tablet 0  . traMADol (ULTRAM) 50 MG tablet Take 1 tablet (50 mg total) by mouth every 6 (six) hours as needed for pain. 15 tablet 0  . [DISCONTINUED] diltiazem (DILACOR XR) 120 MG 24 hr capsule Take 1 capsule (120 mg total) by mouth daily. 30 capsule 6   No current facility-administered medications on file prior to visit.     Review of Systems See HPI     Objective:   Physical Exam BP 130/80 mmHg  Pulse 68  Temp(Src) 98.2 F (36.8 C) (Oral)  Ht 5\' 3"  (1.6 m)  Wt 196 lb (88.905 kg)  BMI 34.73 kg/m2 Gen: NAD, alert, cooperative with exam, well-appearing HEENT: NCAT, PERRL, clear conjunctiva, oropharynx clear, supple neck, nasal turbinates edematous CV: irregularly irregular, good S1/S2, Resp: CTABL, no wheezes, non-labored Back:  Appearance: no overlying erythema or ecchymosis  Palpation: tenderness of paraspinal muscles yes on right sided lumbar , spinous process no;  pelvis no  Hip:  Palpation: tenderness of greater trochanter no Rotation Reduced: internal no, external no Neuro: Strength hip flexion 5/5,  knee extension 5/5, knee flexion 5/5, dorsiflexion 5/5, plantar flexion 5/5 Reflexes: patella 2/2 Bilateral   Straight Leg Raise: negative Sensation to light touch intact: yes Neurovascularly intact     Assessment & Plan:

## 2014-11-08 NOTE — Assessment & Plan Note (Signed)
Monitor rhythm with CardioNet for 2 weeks.

## 2014-11-08 NOTE — Progress Notes (Signed)
Date:  11/08/2014   ID:  [name], DOB October 17, 1951, MRN [id number]  PCP:  Rosemarie Ax, MD  Primary Cardiologist: Hochrein    History of Present Illness: [name] is a 63 y.o. female with a history of paroxysmal atrial fibrillation, hypertension, coronary artery disease with reported history of PCI in 2000, hyperlipidemia, hypothyroidism, spontaneous vaginal bleeding(spotting) while on Coumadin.  SP two D&C procedures in the past.   She was seen at [facility] November 3 and discharge November 4 for presyncope. 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 She held on but did not lose consciousness. EKG at admission was sinus rhythm.  She had another ekg on 11/4 which revealed sinus bradycardia with a rate of 52. Her Cardizem was discontinued and atenolol was dropped to 12.5 mg daily.  CT of her head showed no acute intracranial findings. 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  Elevated TSH levothyroxine was increased to 150 g daily.  She was also referred to vestibular rehabilitation.  She presents for follow-up.  EKG today reveals atrial fibrillation with a rate of 102 bpm she's not had any further presyncopal episodes. She had one episode of dizziness where she actually felt off balance.   The patient currently denies nausea, vomiting, fever, chest pain, shortness of breath, orthopnea, PND, cough, congestion, abdominal pain, hematochezia, melena, lower extremity edema, claudication.  Wt Readings from Last 3 Encounters:  11/08/14 196 lb (88.905 kg)  11/08/14 194 lb 4.8 oz (88.134 kg)  10/30/14 193 lb 3.2 oz (87.635 kg)     Past Medical History  Diagnosis Date  . Hypertension   . CAD (coronary artery disease)     a. reported hx of PCI in 2000 (?records);  b.  Myoview 5/10:  EF 71%, no ischemia  . HLD (hyperlipidemia)   . Hypothyroidism   . SVD  (spontaneous vaginal delivery)     x 5  . Atrial fibrillation     Echo 5/11:  EF 65%, NL diast fxn  . Arthritis     wrist, hands - tx with otc meds  . Postmenopausal bleeding 12/07/2012  . MI, old 9  . Personal history of colonic adenoma 11/11/2004    Current Outpatient Prescriptions  Medication Sig Dispense Refill  . aspirin EC 81 MG tablet Take 1 tablet (81 mg total) by mouth daily.    Marland Kitchen atorvastatin (LIPITOR) 80 MG tablet Take 1 tablet (80 mg total) by mouth daily. 90 tablet 3  . clotrimazole (LOTRIMIN) 1 % cream Apply 1 application topically 2 (two) times daily. (Patient taking differently: Apply 1 application topically 2 (two) times daily as needed (for rash). ) 60 g 0  . diltiazem (CARDIZEM CD) 120 MG 24 hr capsule Take 120 mg by mouth daily.    Marland Kitchen guaiFENesin-codeine (CHERATUSSIN AC) 100-10 MG/5ML syrup Take 5 mLs by mouth 3 (three) times daily as needed for cough. 120 mL 0  . ibuprofen (ADVIL,MOTRIN) 200 MG tablet Take 400 mg by mouth every 6 (six) hours as needed for pain.    Marland Kitchen ipratropium (ATROVENT) 0.06 % nasal spray Place 2 sprays into both nostrils 4 (four) times daily. Prn runny nose 15 mL 12  . levothyroxine (SYNTHROID, LEVOTHROID) 150 MCG tablet Take 1 tablet (150 mcg total) by mouth daily. 30 tablet 0  . meclizine (ANTIVERT) 12.5 MG tablet Take 1 tablet (12.5 mg total)  by mouth 2 (two) times daily as needed for dizziness. 30 tablet 0  . traMADol (ULTRAM) 50 MG tablet Take 1 tablet (50 mg total) by mouth every 6 (six) hours as needed for pain. 15 tablet 0  . apixaban (ELIQUIS) 5 MG TABS tablet Take 1 tablet (5 mg total) by mouth 2 (two) times daily. 60 tablet 6  . [DISCONTINUED] diltiazem (DILACOR XR) 120 MG 24 hr capsule Take 1 capsule (120 mg total) by mouth daily. 30 capsule 6   No current facility-administered medications for this visit.    Allergies:    Allergies  Allergen Reactions  . Penicillins Itching  . Percocet [Oxycodone-Acetaminophen] Itching     Social History:  The patient  reports that she quit smoking about 33 years ago. Her smoking use included Cigarettes. She has a 22.5 pack-year smoking history. She has never used smokeless tobacco. She reports that she drinks about 1.8 oz of alcohol per week. She reports that she does not use illicit drugs.   Family history:   Family History  Problem Relation Age of Onset  . Diabetes Mother   . Heart disease Mother   . Colon cancer Mother 87  . Heart disease Father   . Cancer Brother     intestinal  . Cancer Brother     throat ca  . Cancer Brother     bladder  . Cancer Sister     Lung    ROS:  Please see the history of present illness.  All other systems reviewed and negative.   PHYSICAL EXAM: VS:  BP 102/74 mmHg  Pulse 102  Ht 5\' 3"  (1.6 m)  Wt 194 lb 4.8 oz (88.134 kg)  BMI 34.43 kg/m2 obese, well developed, in no acute distress HEENT: Pupils are equal round react to light accommodation extraocular movements are intact.  Neck: no JVDNo cervical lymphadenopathy. Cardiac: irregular rate and rhythm without murmurs rubs or gallops. Lungs:  clear to auscultation bilaterally, no wheezing, rhonchi or rales Ext: no lower extremity edema.  2+ radial and dorsalis pedis pulses. Skin: warm and dry Neuro:  Grossly normal  EKG:  Atrial fibrillation with a rate of 102 bpm     ASSESSMENT AND PLAN:  Problem List Items Addressed This Visit    Atrial fibrillation    Patient is back in atrial fibrillation. Perhaps you cardia was partially due to low thyroid hormone.  She was discharged from [facility] on November 4 on decreased atenolol(12.5) and DC'd diltiazem. She was seen for presyncope and was mildly bradycardic.  She also stopped her Coumadin because of vaginal bleeding cost of her Coumadin appointments.  Going to start her on Eliquis 5 mg twice daily.  We are providing samples.  She restarts her spotting will have to discontinue.   Restart her diltiazem 120 mg daily.  DC atenolol  due to BP.  Will also put her on a CardioNet monitor for 2 weeks to monitor for arrhythmia/bradycardia.  She also had a prolonged QT C in the hospital. Check a magnesium(not down in hospital)      Relevant Medications      diltiazem (CARDIZEM CD) 120 MG 24 hr capsule      apixaban (ELIQUIS) tablet   Other Relevant Orders      EKG 12-Lead      Cardiac event monitor      Magnesium (Completed)

## 2014-11-08 NOTE — Patient Instructions (Signed)
Thank you for coming in,   Continue to use heat for the first 48 hours. Ice can be used after that.  Stretches and exercises will help loosen the muscle help.  You can take tylenol for the pain.   Please call me after 3-4 weeks if your back pain hasn't improved.   Take flonase for the nasal problems. Take 2 puffs for twice a day for two weeks, then once daily. If this doesn't work after a week, call me and we can start an antibiotic.     Please feel free to call with any questions or concerns at any time, at [phone]. --Dr. Raeford Razor

## 2014-11-08 NOTE — Assessment & Plan Note (Addendum)
Patient is back in atrial fibrillation. Perhaps you cardia was partially due to low thyroid hormone.  She was discharged from [facility] on November 4 on decreased atenolol(12.5) and DC'd diltiazem. She was seen for presyncope and was mildly bradycardic.  She also stopped her Coumadin because of vaginal bleeding cost of her Coumadin appointments.  Going to start her on Eliquis 5 mg twice daily.  We are providing samples.  She restarts her spotting will have to discontinue.   Restart her diltiazem 120 mg daily.  DC atenolol due to BP.  Will also put her on a CardioNet monitor for 2 weeks to monitor for arrhythmia/bradycardia.  She also had a prolonged QT C in the hospital. Check a magnesium(not down in hospital)

## 2014-11-08 NOTE — Assessment & Plan Note (Signed)
Check a magnesium

## 2014-11-08 NOTE — Patient Instructions (Addendum)
Your physician recommends that you schedule a follow-up appointment in: 1 Month with Dr Percival Spanish  Your physician has recommended you make the following change in your medication: Start Eliquis 5 mg twice a day and Start Cartia 120 mg daily and STOP Atenolol  Your physician has recommended that you wear an event monitor. Event monitors are medical devices that record the heart's electrical activity. Doctors most often [place] these monitors to diagnose arrhythmias. Arrhythmias are problems with the speed or rhythm of the heartbeat. The monitor is a small, portable device. You can wear one while you do your normal daily activities. This is usually used to diagnose what is causing palpitations/syncope (passing out). 2 Weeks  Your physician recommends that you return for lab work in: Magnesium

## 2014-11-10 DIAGNOSIS — M545 Low back pain, unspecified: Secondary | ICD-10-CM | POA: Insufficient documentation

## 2014-11-10 NOTE — Assessment & Plan Note (Signed)
Most likely muscle strain in nature. Acute onset.  - home modalities  - tylenol for pain  - HEat or ICE  - may consider PT if persists or flexeril

## 2014-11-10 NOTE — Assessment & Plan Note (Signed)
Feeling much better today. Unsure of the origin. Has a hx of Afib and presented to hospital with bradycardia. Is wearing a monitor that her cardiologist applied.  - PRN with me

## 2014-11-10 NOTE — Assessment & Plan Note (Addendum)
Most likely viral in orgiin. Could be bacterial sinusitis based on history of possible "double sickening"  - flonase 2 puffs BID for two weeks then one puff daily  - if no improvement in 7-10, consider adding ABX

## 2014-11-11 ENCOUNTER — Ambulatory Visit (INDEPENDENT_AMBULATORY_CARE_PROVIDER_SITE_OTHER): Payer: Medicare Other | Admitting: *Deleted

## 2014-11-11 ENCOUNTER — Telehealth: Payer: Self-pay | Admitting: Cardiology

## 2014-11-11 DIAGNOSIS — Z23 Encounter for immunization: Secondary | ICD-10-CM

## 2014-11-11 NOTE — Telephone Encounter (Signed)
Spoke with pt, she is having episodes of atrial fib with RVR. She is at work now and feels fine. She did feel a fluttering with the two episodes that were faxed to [place] from [facility]. Medications confirmed. Will make sure dr hochrein sees these strips when back in the office on wednesday

## 2014-11-15 ENCOUNTER — Telehealth: Payer: Self-pay | Admitting: Cardiology

## 2014-11-15 NOTE — Telephone Encounter (Signed)
Spoke to [employer] rep Reporting a urgent report of auto -trigger  AFIB  RATE OF 176 about 7:59am tday. Sending report RN received report - given to [place]  - to give to Dr Percival Spanish

## 2014-11-15 NOTE — Telephone Encounter (Signed)
Ellison Hughs is calling in with an abnormal EKG

## 2014-11-15 NOTE — Telephone Encounter (Signed)
Dr. Percival Spanish informed about pt.s rate

## 2014-11-15 NOTE — Telephone Encounter (Signed)
Received call about pt.s rate from [facility]

## 2014-11-19 ENCOUNTER — Telehealth: Payer: Self-pay | Admitting: Cardiology

## 2014-11-19 NOTE — Telephone Encounter (Signed)
Received call concerning pt. Rate of 164

## 2014-11-22 ENCOUNTER — Telehealth: Payer: Self-pay | Admitting: Cardiology

## 2014-11-22 NOTE — Telephone Encounter (Signed)
[name] is calling with an abnormal EKG . Please call    Thanks

## 2014-11-22 NOTE — Telephone Encounter (Signed)
Received a call from [employer] patient had run of atrial fib with rate of 163 bpm this afternoon.Will fax ekg strip to office.Patient was called she stated she never felt fast heart beat.Stated she has felt ok today.Dr.Hochrein out of office.Spoke to DOD Dr.Skains he advised to increase Diltiazem 240 mg daily.Advised to watch B/P.Advised needs appointment next week with PA or NP.Schedulers will call back to schedule appointment.

## 2014-11-25 ENCOUNTER — Encounter: Payer: Self-pay | Admitting: Cardiology

## 2014-11-25 ENCOUNTER — Other Ambulatory Visit: Payer: Self-pay

## 2014-11-25 ENCOUNTER — Ambulatory Visit (INDEPENDENT_AMBULATORY_CARE_PROVIDER_SITE_OTHER): Payer: Medicare Other | Admitting: Cardiology

## 2014-11-25 VITALS — BP 138/68 | HR 67 | Ht 63.0 in | Wt 196.2 lb

## 2014-11-25 DIAGNOSIS — E785 Hyperlipidemia, unspecified: Secondary | ICD-10-CM

## 2014-11-25 DIAGNOSIS — I480 Paroxysmal atrial fibrillation: Secondary | ICD-10-CM

## 2014-11-25 DIAGNOSIS — I2510 Atherosclerotic heart disease of native coronary artery without angina pectoris: Secondary | ICD-10-CM

## 2014-11-25 DIAGNOSIS — I10 Essential (primary) hypertension: Secondary | ICD-10-CM

## 2014-11-25 MED ORDER — ATORVASTATIN CALCIUM 80 MG PO TABS: 80.0000 mg | ORAL_TABLET | Freq: Every day | ORAL | Status: DC

## 2014-11-25 NOTE — Patient Instructions (Signed)
I would resume diltiazem 120 mg daily.  Continue your other medication.  We will schedule you for follow up with Dr. Percival Spanish in 3 months.

## 2014-11-25 NOTE — Progress Notes (Signed)
HPI The patient presents for evaluation of atrial fibrillation. She is seen as a work in.  She has a known history of paroxysmal Afib and is managed with Eliquis and diltiazem. She was seen last month with a syncopal episode. She was placed on an event monitor. She was noted to have a brief run of afib lasting only 11 beats. It was recommended that she increase diltiazem to 240 mg daily. She was completely asymptomatic.   Allergies  Allergen Reactions  . Penicillins Itching  . Percocet [Oxycodone-Acetaminophen] Itching   Prior to Admission medications   Medication Sig Start Date End Date Taking? Authorizing Provider  atenolol (TENORMIN) 50 MG tablet Take 25 mg by mouth every evening.   Yes Historical Provider, MD  diltiazem (CARDIZEM CD) 120 MG 24 hr capsule Take 120 mg by mouth daily.   Yes Historical Provider, MD  ibuprofen (ADVIL,MOTRIN) 200 MG tablet Take 400 mg by mouth every 6 (six) hours as needed for pain.   Yes Historical Provider, MD  levothyroxine (SYNTHROID, LEVOTHROID) 125 MCG tablet Take 1 tablet (125 mcg total) by mouth daily. 01/24/14  Yes Rosemarie Ax, MD  rosuvastatin (CRESTOR) 40 MG tablet Take 1 tablet (40 mg total) by mouth daily. 03/16/13 03/16/14 Yes [place], DO  traMADol (ULTRAM) 50 MG tablet Take 1 tablet (50 mg total) by mouth every 6 (six) hours as needed for pain. 05/31/13  Yes Adlih Moreno-Coll, MD  ASA  81 mg Take one daily         Past Medical History  Diagnosis Date  . Hypertension   . CAD (coronary artery disease)     a. reported hx of PCI in 2000 (?records);  b.  Myoview 5/10:  EF 71%, no ischemia  . HLD (hyperlipidemia)   . Hypothyroidism   . SVD (spontaneous vaginal delivery)     x 5  . Atrial fibrillation     Echo 5/11:  EF 65%, NL diast fxn  . Arthritis     wrist, hands - tx with otc meds  . Postmenopausal bleeding 12/07/2012  . 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 Personal history of colonic adenoma 11/11/2004     Past Surgical History  Procedure  Laterality Date  . Tubal ligation  1974  . Dilation and curettage of uterus  2013    hysteroscopy  . Joint replacement Left 2010    left hip   . Hysteroscopy w/d&c  12/07/2012    Procedure: DILATATION AND CURETTAGE /HYSTEROSCOPY;  Surgeon: Elveria Royals, MD;  Location: [facility];  Service: Gynecology;  Laterality: N/A;  Dilitation and curettage /hysteroscopy/biopsey of lower segment of uterus    ROS:  As stated in the HPI and negative for all other systems.  PHYSICAL EXAM BP 138/68 mmHg  Pulse 67  Ht 5\' 3"  (1.6 m)  Wt 196 lb 4 oz (89.018 kg)  BMI 34.77 kg/m2 GENERAL:  Well appearing NECK:  No jugular venous distention, waveform within normal limits, carotid upstroke brisk and symmetric, no bruits, no thyromegaly LUNGS:  Clear to auscultation bilaterally CHEST:  Unremarkable HEART:  PMI not displaced or sustained,S1 and S2 within normal limits, no S3, no S4, no clicks, no rubs, no murmurs ABD:  Flat, positive bowel sounds normal in frequency in pitch, no bruits, no rebound, no guarding, no midline pulsatile mass, no hepatomegaly, no splenomegaly EXT:  2 plus pulses throughout, no edema, no cyanosis no clubbing  Ecg: today NSR, normal. I have personally reviewed and interpreted  this study.  ASSESSMENT AND PLAN  ATRIAL FIBRILLATION -  Brief 11 beat run on event monitor. This was asymptomatic. I told her I didn't think she needed to increase her medication at this time. Will continue diltiazem 120 mg daily and Atenolol. Follow up with Dr. Percival Spanish in 3 months. Complete event monitor.

## 2014-11-26 ENCOUNTER — Telehealth: Payer: Self-pay | Admitting: Cardiology

## 2014-11-26 ENCOUNTER — Encounter: Payer: Self-pay | Admitting: Cardiology

## 2014-11-27 NOTE — Telephone Encounter (Signed)
Closed encounter °

## 2014-12-10 ENCOUNTER — Ambulatory Visit: Payer: Medicare Other | Admitting: Cardiology

## 2014-12-23 ENCOUNTER — Telehealth: Payer: Self-pay | Admitting: *Deleted

## 2014-12-23 NOTE — Telephone Encounter (Signed)
Faxed PA for Eliquis 5mg  BID to [facility]

## 2014-12-31 ENCOUNTER — Telehealth: Payer: Self-pay | Admitting: *Deleted

## 2014-12-31 NOTE — Telephone Encounter (Signed)
Confirmed with pharmacy that Eliquis 5mg  BID was approved.

## 2015-02-13 ENCOUNTER — Encounter: Payer: Self-pay | Admitting: Cardiology

## 2015-02-13 ENCOUNTER — Ambulatory Visit (INDEPENDENT_AMBULATORY_CARE_PROVIDER_SITE_OTHER): Payer: Medicare Other | Admitting: Cardiology

## 2015-02-13 VITALS — BP 129/74 | HR 83 | Ht 63.0 in | Wt 195.0 lb

## 2015-02-13 DIAGNOSIS — I10 Essential (primary) hypertension: Secondary | ICD-10-CM

## 2015-02-13 DIAGNOSIS — I480 Paroxysmal atrial fibrillation: Secondary | ICD-10-CM | POA: Diagnosis not present

## 2015-02-13 MED ORDER — WARFARIN SODIUM 5 MG PO TABS: 5.0000 mg | ORAL_TABLET | Freq: Every day | ORAL | Status: DC

## 2015-02-13 NOTE — Patient Instructions (Signed)
Your physician recommends that you schedule a follow-up appointment in: 2 months with an extender  Take your Eliquis and Warfarin together for the next three days then stop taking the Eliquis, We will make you an appt. To see our pharmacist on Wednesday at 1:00 pm   We have increased your cardizem to 300 mg

## 2015-02-13 NOTE — Progress Notes (Signed)
HPI The patient presents for evaluation of atrial fibrillation.  Since I last saw her she has been seen in our office twice.  She has had a symptomatic paroxysmal atrial fibrillation. She's had rate control. She was started on  Eliquis.  She's had no further bleeding other than some mild hemorrhoidal bleeding. She's had none of the vaginal bleeding that was a problem before. She doesn't feel that fibrillation if she is indicated today.  She denies any presyncope or syncope. She has had no chest pressure, neck or arm discomfort. She walks for exercise.  She denies any of the chest pain that she had with her CAD.   Allergies  Allergen Reactions  . Penicillins Itching  . Percocet [Oxycodone-Acetaminophen] Itching   Prior to Admission medications   Medication Sig Start Date End Date Taking? Authorizing Provider  apixaban (ELIQUIS) 5 MG TABS tablet Take 1 tablet (5 mg total) by mouth 2 (two) times daily. 11/08/14  Yes Brett Canales, PA-C  aspirin EC 81 MG tablet Take 1 tablet (81 mg total) by mouth daily. 02/15/14  Yes Minus Breeding, MD  atorvastatin (LIPITOR) 80 MG tablet Take 1 tablet (80 mg total) by mouth daily. 11/25/14  Yes Minus Breeding, MD  clotrimazole (LOTRIMIN) 1 % cream Apply 1 application topically 2 (two) times daily. Patient taking differently: Apply 1 application topically 2 (two) times daily as needed (for rash).  05/28/14  Yes Timmothy Euler, MD  diltiazem (CARDIZEM CD) 240 MG 24 hr capsule Take 1 capsule (240 mg total) by mouth daily. 11/22/14  Yes Candee Furbish, MD  fluticasone (FLONASE) 50 MCG/ACT nasal spray Place 2 sprays into both nostrils daily. 11/08/14  Yes Rosemarie Ax, MD  guaiFENesin-codeine [facility]) 100-10 MG/5ML syrup Take 5 mLs by mouth 3 (three) times daily as needed for cough. 09/27/14  Yes Janne Napoleon, NP  ibuprofen (ADVIL,MOTRIN) 200 MG tablet Take 400 mg by mouth every 6 (six) hours as needed for pain.   Yes Historical Provider, MD  ipratropium  (ATROVENT) 0.06 % nasal spray Place 2 sprays into both nostrils 4 (four) times daily. Prn runny nose 09/27/14  Yes Janne Napoleon, NP  levothyroxine (SYNTHROID, LEVOTHROID) 150 MCG tablet Take 1 tablet (150 mcg total) by mouth daily. 10/30/14  Yes Archie Patten, MD  meclizine (ANTIVERT) 12.5 MG tablet Take 1 tablet (12.5 mg total) by mouth 2 (two) times daily as needed for dizziness. 10/30/14  Yes Archie Patten, MD  traMADol (ULTRAM) 50 MG tablet Take 1 tablet (50 mg total) by mouth every 6 (six) hours as needed for pain. 05/31/13  Yes Randa Spike, MD     Past Medical History  Diagnosis Date  . Hypertension   . CAD (coronary artery disease)     a. reported hx of PCI in 2000 (?records);  b.  Myoview 5/10:  EF 71%, no ischemia  . HLD (hyperlipidemia)   . Hypothyroidism   . SVD (spontaneous vaginal delivery)     x 5  . Atrial fibrillation     Echo 5/11:  EF 65%, NL diast fxn  . Arthritis     wrist, hands - tx with otc meds  . Postmenopausal bleeding 12/07/2012  . MI, old 45  . Personal history of colonic adenoma 11/11/2004     Past Surgical History  Procedure Laterality Date  . Tubal ligation  1974  . Dilation and curettage of uterus  2013    hysteroscopy  . Joint replacement Left 2010  left hip   . Hysteroscopy w/d&c  12/07/2012    Procedure: DILATATION AND CURETTAGE /HYSTEROSCOPY;  Surgeon: Elveria Royals, MD;  Location: [facility];  Service: Gynecology;  Laterality: N/A;  Dilitation and curettage /hysteroscopy/biopsey of lower segment of uterus     ROS:  As stated in the HPI and negative for all other systems.  PHYSICAL EXAM BP 129/74 mmHg  Pulse 83  Ht 5\' 3"  (1.6 m)  Wt 195 lb (88.451 kg)  BMI 34.55 kg/m2 GENERAL:  Well appearing NECK:  No jugular venous distention, waveform within normal limits, carotid upstroke brisk and symmetric, no bruits, no thyromegaly LUNGS:  Clear to auscultation bilaterally CHEST:  Unremarkable HEART:  PMI not displaced or sustained,S1  and S2 within normal limits, no S3, no S4, no clicks, no rubs, no murmurs ABD:  Flat, positive bowel sounds normal in frequency in pitch, no bruits, no rebound, no guarding, no midline pulsatile mass, no hepatomegaly, no splenomegaly EXT:  2 plus pulses throughout, no edema, no cyanosis no clubbing  EKG:  Atrial fibrillation, rate 86, axis within normal limits, intervals within normal limits, poor anterior R wave progression, no acute ST-T wave changes. 02/13/2015  ASSESSMENT AND PLAN  ATRIAL FIBRILLATION -  She is now having paroxysmal atrial fibrillation. In the office today walking around her heart rate was 86 at rest but went into the 120s with ambulation. I will increase her Cardizem to 300 mg daily..  We we'll continue with rate control and anticoagulation. She and I discussed how to keep a record of her heart rate and in the future I will likely either perform a treadmill test or a Holter to make sure she has adequate rate control. She'll let me know if she has any problems with bleeding although this hasn't been a current issue. She cannot afford the Eliquis. I will be referring her to warfarin.   ESSENTIAL HYPERTENSION, BENIGN -  The blood pressure is at target. No change in medications is indicated. We will continue with therapeutic lifestyle changes (TLC).   CAD - She has no active ischemia and no symptoms of perfusion study in 2010. Continue with risk reduction.   She will stop her ASA.

## 2015-02-14 ENCOUNTER — Other Ambulatory Visit: Payer: Self-pay | Admitting: *Deleted

## 2015-02-14 ENCOUNTER — Telehealth: Payer: Self-pay | Admitting: Cardiology

## 2015-02-14 MED ORDER — DILTIAZEM HCL ER COATED BEADS 300 MG PO CP24: 300.0000 mg | ORAL_CAPSULE | Freq: Every day | ORAL | Status: DC

## 2015-02-14 NOTE — Telephone Encounter (Signed)
°  1. Which medications need to be refilled? Diltiazem 2. Which pharmacy is medication to be sent to?[facility]-[phone]  3. Do they need a 30 day or 90 day supply? 30 and refills  4. Would they like a call back once the medication has been sent to the pharmacy? yes

## 2015-02-19 ENCOUNTER — Ambulatory Visit (INDEPENDENT_AMBULATORY_CARE_PROVIDER_SITE_OTHER): Payer: Medicare Other | Admitting: Pharmacist Clinician (PhC)/ Clinical Pharmacy Specialist

## 2015-02-19 DIAGNOSIS — I480 Paroxysmal atrial fibrillation: Secondary | ICD-10-CM

## 2015-02-19 DIAGNOSIS — Z7901 Long term (current) use of anticoagulants: Secondary | ICD-10-CM | POA: Insufficient documentation

## 2015-02-19 LAB — POCT INR: INR: 1.5

## 2015-02-24 ENCOUNTER — Ambulatory Visit: Payer: Self-pay | Admitting: Cardiology

## 2015-02-24 ENCOUNTER — Ambulatory Visit: Payer: Medicare Other | Admitting: Cardiology

## 2015-02-26 ENCOUNTER — Ambulatory Visit (INDEPENDENT_AMBULATORY_CARE_PROVIDER_SITE_OTHER): Payer: Medicare Other | Admitting: Pharmacist Clinician (PhC)/ Clinical Pharmacy Specialist

## 2015-02-26 DIAGNOSIS — Z7901 Long term (current) use of anticoagulants: Secondary | ICD-10-CM

## 2015-02-26 DIAGNOSIS — I480 Paroxysmal atrial fibrillation: Secondary | ICD-10-CM

## 2015-02-26 LAB — POCT INR: INR: 2.1

## 2015-03-05 ENCOUNTER — Ambulatory Visit (INDEPENDENT_AMBULATORY_CARE_PROVIDER_SITE_OTHER): Payer: Medicare Other | Admitting: Pharmacist Clinician (PhC)/ Clinical Pharmacy Specialist

## 2015-03-05 DIAGNOSIS — Z7901 Long term (current) use of anticoagulants: Secondary | ICD-10-CM | POA: Diagnosis not present

## 2015-03-05 DIAGNOSIS — I480 Paroxysmal atrial fibrillation: Secondary | ICD-10-CM

## 2015-03-05 LAB — POCT INR: INR: 2.1

## 2015-03-19 ENCOUNTER — Ambulatory Visit (INDEPENDENT_AMBULATORY_CARE_PROVIDER_SITE_OTHER): Payer: Medicare Other | Admitting: Pharmacist Clinician (PhC)/ Clinical Pharmacy Specialist

## 2015-03-19 DIAGNOSIS — Z7901 Long term (current) use of anticoagulants: Secondary | ICD-10-CM

## 2015-03-19 DIAGNOSIS — I480 Paroxysmal atrial fibrillation: Secondary | ICD-10-CM

## 2015-03-19 LAB — POCT INR: INR: 2.2

## 2015-03-20 ENCOUNTER — Encounter: Payer: Self-pay | Admitting: Family Medicine

## 2015-03-20 ENCOUNTER — Ambulatory Visit (INDEPENDENT_AMBULATORY_CARE_PROVIDER_SITE_OTHER): Payer: Medicare Other | Admitting: Family Medicine

## 2015-03-20 VITALS — BP 122/70 | HR 91 | Temp 98.3°F | Ht 63.0 in | Wt 198.6 lb

## 2015-03-20 DIAGNOSIS — M545 Low back pain, unspecified: Secondary | ICD-10-CM

## 2015-03-20 LAB — CBC WITH DIFFERENTIAL/PLATELET
Basophils Absolute: 0 10*3/uL (ref 0.0–0.1)
Basophils Relative: 0 % (ref 0–1)
Eosinophils Absolute: 0.2 10*3/uL (ref 0.0–0.7)
Eosinophils Relative: 2 % (ref 0–5)
HCT: 39.8 % (ref 36.0–46.0)
Hemoglobin: 13.5 g/dL (ref 12.0–15.0)
Lymphocytes Relative: 32 % (ref 12–46)
Lymphs Abs: 2.5 10*3/uL (ref 0.7–4.0)
MCH: 29.6 pg (ref 26.0–34.0)
MCHC: 33.9 g/dL (ref 30.0–36.0)
MCV: 87.3 fL (ref 78.0–100.0)
MPV: 10.4 fL (ref 8.6–12.4)
Monocytes Absolute: 0.9 10*3/uL (ref 0.1–1.0)
Monocytes Relative: 11 % (ref 3–12)
Neutro Abs: 4.3 10*3/uL (ref 1.7–7.7)
Neutrophils Relative %: 55 % (ref 43–77)
Platelets: 287 10*3/uL (ref 150–400)
RBC: 4.56 MIL/uL (ref 3.87–5.11)
RDW: 13.4 % (ref 11.5–15.5)
WBC: 7.8 10*3/uL (ref 4.0–10.5)

## 2015-03-20 LAB — COMPREHENSIVE METABOLIC PANEL
ALT: 17 U/L (ref 0–35)
AST: 19 U/L (ref 0–37)
Albumin: 3.8 g/dL (ref 3.5–5.2)
Alkaline Phosphatase: 73 U/L (ref 39–117)
BUN: 19 mg/dL (ref 6–23)
CO2: 25 mEq/L (ref 19–32)
Calcium: 8.8 mg/dL (ref 8.4–10.5)
Chloride: 101 mEq/L (ref 96–112)
Creat: 0.9 mg/dL (ref 0.50–1.10)
Glucose, Bld: 129 mg/dL — ABNORMAL HIGH (ref 70–99)
Potassium: 4.2 mEq/L (ref 3.5–5.3)
Sodium: 137 mEq/L (ref 135–145)
Total Bilirubin: 0.3 mg/dL (ref 0.2–1.2)
Total Protein: 7.2 g/dL (ref 6.0–8.3)

## 2015-03-20 LAB — POCT GLYCOSYLATED HEMOGLOBIN (HGB A1C): Hemoglobin A1C: 6.5

## 2015-03-20 MED ORDER — CYCLOBENZAPRINE HCL 10 MG PO TABS: 5.0000 mg | ORAL_TABLET | Freq: Three times a day (TID) | ORAL | Status: DC | PRN

## 2015-03-20 MED ORDER — TRAMADOL HCL 50 MG PO TABS: 50.0000 mg | ORAL_TABLET | Freq: Four times a day (QID) | ORAL | Status: DC | PRN

## 2015-03-20 NOTE — Assessment & Plan Note (Signed)
Pertinent S&O  Left-sided lower back pain with radiation to left knee consistent with lumbago without sciatica  History of left hip replacement  No red flags Assessment  Likely lumbar muscle strain; however, giving mild tenderness to palpation over the lumbar vertebrae and age greater than 31 will obtain lumbar x-rays Plan  DG lumbar  Flexeril - worked previously for similar back pain  Ultram: 5-100 mg when necessary  Discussed heating pad and back stretches  Follow-up with PCP in 2 weeks

## 2015-03-20 NOTE — Progress Notes (Signed)
   Subjective:    Patient ID: [name], female    DOB: 04/18/1951, 65 y.o.   MRN: [id number]  Seen for Same day visit for   CC: left side back pain  BACK PAIN Dull ache x 2 days treated with tylenol. Worse over last 2 days, and is now constant and affecting sleep.  History of left hip replacement approximately 2011.  She denies any falls or trauma.  Pain is different than previous hip pain, but similar to previous right-sided lower back pain that responded to muscle relaxers and exercises.  She denies any fevers, chills, incontinence or urinary retention.  Not any lower extremity numbness or weakness.   Back pain began 4 days ago. Pain is described as achy. Patient has tried tylenol. Pain radiates to left knee. History of trauma or injury: no Prior history of similar pain: no History of cancer: no Weak immune system:  no History of IV drug use: no History of steroid use: no  Symptoms Incontinence of bowel or bladder:  no Numbness of leg: no Fever: no Rest or Night pain: yes - during last 24 hours Weight Loss:  no* Rash: no  ROS see HPI Smoking Status noted.  Objective:  BP 122/70 mmHg  Pulse 91  Temp(Src) 98.3 F (36.8 C) (Oral)  Ht 5\' 3"  (1.6 m)  Wt 198 lb 9 oz (90.067 kg)  BMI 35.18 kg/m2  General: NAD Back: Normal skin w/o rash, Spine with normal alignment and no deformity. mild tenderness to vertebral process palpation.  Paraspinous muscles are moderately tender and without spasm.  Negative straight leg and cross leg raise test.  Lower extremity strength 5 out of 5 bilaterally in knee extension, flexion, ankle dorsiflexion & plantar flexion. sensation intact bilaterally    Assessment & Plan:  See Problem List Documentation

## 2015-03-24 ENCOUNTER — Ambulatory Visit
Admission: RE | Admit: 2015-03-24 | Discharge: 2015-03-24 | Disposition: A | Discharge status: Home / Self Care | Payer: Medicare Other | Source: Ambulatory Visit | Attending: Family Medicine | Admitting: Family Medicine

## 2015-03-24 DIAGNOSIS — M47816 Spondylosis without myelopathy or radiculopathy, lumbar region: Secondary | ICD-10-CM | POA: Diagnosis not present

## 2015-03-24 DIAGNOSIS — M545 Low back pain, unspecified: Secondary | ICD-10-CM

## 2015-03-24 DIAGNOSIS — M5136 Other intervertebral disc degeneration, lumbar region: Secondary | ICD-10-CM | POA: Diagnosis not present

## 2015-03-25 ENCOUNTER — Telehealth: Payer: Self-pay | Admitting: Family Medicine

## 2015-03-25 NOTE — Telephone Encounter (Signed)
Called and informed patient of her lumbar xray results. She reports pain is much improved. She will f/u with her PCP in the next weeks to discuss exercise to prevent future problems. Also informed her of her A1c = borderline DM. She will discuss this with her PCP and consider Metformin vs lifestyle modifications.

## 2015-04-08 ENCOUNTER — Ambulatory Visit (INDEPENDENT_AMBULATORY_CARE_PROVIDER_SITE_OTHER): Payer: Medicare Other | Admitting: Family Medicine

## 2015-04-08 ENCOUNTER — Encounter: Payer: Self-pay | Admitting: Family Medicine

## 2015-04-08 VITALS — BP 123/74 | HR 73 | Temp 98.1°F | Ht 63.0 in | Wt 197.9 lb

## 2015-04-08 DIAGNOSIS — E039 Hypothyroidism, unspecified: Secondary | ICD-10-CM | POA: Diagnosis not present

## 2015-04-08 DIAGNOSIS — I480 Paroxysmal atrial fibrillation: Secondary | ICD-10-CM

## 2015-04-08 DIAGNOSIS — H7490 Unspecified disorder of middle ear and mastoid, unspecified ear: Secondary | ICD-10-CM

## 2015-04-08 DIAGNOSIS — J309 Allergic rhinitis, unspecified: Secondary | ICD-10-CM

## 2015-04-08 DIAGNOSIS — Z0000 Encounter for general adult medical examination without abnormal findings: Secondary | ICD-10-CM

## 2015-04-08 DIAGNOSIS — E2839 Other primary ovarian failure: Secondary | ICD-10-CM

## 2015-04-08 DIAGNOSIS — IMO0001 Reserved for inherently not codable concepts without codable children: Secondary | ICD-10-CM

## 2015-04-08 LAB — TSH: TSH: 1.794 u[IU]/mL (ref 0.350–4.500)

## 2015-04-08 LAB — T4, FREE: Free T4: 1.43 ng/dL (ref 0.80–1.80)

## 2015-04-08 MED ORDER — CETIRIZINE HCL 10 MG PO TABS: 10.0000 mg | ORAL_TABLET | Freq: Every day | ORAL | Status: DC

## 2015-04-08 MED ORDER — LEVOTHYROXINE SODIUM 150 MCG PO TABS: 150.0000 ug | ORAL_TABLET | Freq: Every day | ORAL | Status: DC

## 2015-04-08 NOTE — Patient Instructions (Signed)
Thank you for coming in,   I call with the results from today.   Follow up with me in three months for a check of your diabetes.   Please bring all of your medications with you to each visit.    Please feel free to call with any questions or concerns at any time, at [phone]. --Dr. Raeford Razor  Diet Recommendations for Diabetes   Starchy (carb) foods include: Bread, rice, pasta, potatoes, corn, crackers, bagels, muffins, all baked goods.   Protein foods include: Meat, fish, poultry, eggs, dairy foods, and beans such as pinto and kidney beans (beans also provide carbohydrate).   1. Eat at least 3 meals and 1-2 snacks per day. Never go more than 4-5 hours while awake without eating.  2. Limit starchy foods to TWO per meal and ONE per snack. ONE portion of a starchy  food is equal to the following:   - ONE slice of bread (or its equivalent, such as half of a hamburger bun).   - 1/2 cup of a "scoopable" starchy food such as potatoes or rice.   - 1 OUNCE (28 grams) of starchy snack foods such as crackers or pretzels (look on label).   - 15 grams of carbohydrate as shown on food label.  3. Both lunch and dinner should include a protein food, a carb food, and vegetables.   - Obtain twice as many veg's as protein or carbohydrate foods for both lunch and dinner.   - Try to keep frozen veg's on hand for a quick vegetable serving.     - Fresh or frozen veg's are best.  4. Breakfast should always include protein.

## 2015-04-08 NOTE — Progress Notes (Signed)
Subjective:    Patient ID: [name], female    DOB: 22-Mar-1951, 64 y.o.   MRN: [id number]  HPI  [name] is here for physical.   Is not taking ASA as she is on warfarin. Her cadriologist just recently increased her diltiazen due to her rate being uncontrolled. Needs a mammogram and colonoscopy.   Allergic rhinitis: occurs during this time of year. Her eyes are burning and watering.   Diabetes: recent finding Hgb A1c. Wants to try diet and exercise before taking a medication. Denies any polyuria or polydipsia.   Hypothyroidism: hx of. Taking synthroid and needs a refill. Denies any symptoms currently.   Upon reviewing her discharge summary from her previous admission, it was recommended that she be referred to ENT due to the findings of her CT.   Current Outpatient Prescriptions on File Prior to Visit  Medication Sig Dispense Refill  . aspirin EC 81 MG tablet Take 1 tablet (81 mg total) by mouth daily.    . cyclobenzaprine (FLEXERIL) 10 MG tablet Take 0.5-1 tablets (5-10 mg total) by mouth 3 (three) times daily as needed for muscle spasms. 30 tablet 0  . diltiazem (CARDIZEM CD) 300 MG 24 hr capsule Take 1 capsule (300 mg total) by mouth daily. 30 capsule 3  . fluticasone (FLONASE) 50 MCG/ACT nasal spray Place 2 sprays into both nostrils daily. 16 g 6  . levothyroxine (SYNTHROID, LEVOTHROID) 150 MCG tablet Take 1 tablet (150 mcg total) by mouth daily. 30 tablet 0  . traMADol (ULTRAM) 50 MG tablet Take 1-2 tablets (50-100 mg total) by mouth every 6 (six) hours as needed. 20 tablet 0  . warfarin (COUMADIN) 5 MG tablet Take 5 mg by mouth daily.    Marland Kitchen atorvastatin (LIPITOR) 80 MG tablet Take 1 tablet (80 mg total) by mouth daily. (Patient not taking: Reported on 04/08/2015) 30 tablet 6  . clotrimazole (LOTRIMIN) 1 % cream Apply 1 application topically 2 (two) times daily. (Patient taking differently: Apply 1 application topically 2 (two) times daily as needed (for rash). ) 60 g 0    . guaiFENesin-codeine (CHERATUSSIN AC) 100-10 MG/5ML syrup Take 5 mLs by mouth 3 (three) times daily as needed for cough. (Patient not taking: Reported on 04/08/2015) 120 mL 0  . ibuprofen (ADVIL,MOTRIN) 200 MG tablet Take 400 mg by mouth every 6 (six) hours as needed for pain.    Marland Kitchen ipratropium (ATROVENT) 0.06 % nasal spray Place 2 sprays into both nostrils 4 (four) times daily. Prn runny nose (Patient not taking: Reported on 04/08/2015) 15 mL 12  . meclizine (ANTIVERT) 12.5 MG tablet Take 1 tablet (12.5 mg total) by mouth 2 (two) times daily as needed for dizziness. (Patient not taking: Reported on 04/08/2015) 30 tablet 0  . [DISCONTINUED] diltiazem (DILACOR XR) 120 MG 24 hr capsule Take 1 capsule (120 mg total) by mouth daily. 30 capsule 6   No current facility-administered medications on file prior to visit.    SHx: No longer working and taking care of great grandchild that is 80 yo.   Health Maintenance: needs mammogram and DEXA scan    Review of Systems See HPI     Objective:   Physical Exam BP 123/74 mmHg  Pulse 73  Temp(Src) 98.1 F (36.7 C) (Oral)  Ht 5\' 3"  (1.6 m)  Wt 197 lb 14.4 oz (89.767 kg)  BMI 35.07 kg/m2 Gen: NAD, alert, elderly female  HEENT: NCAT, PERRL, clear conjunctiva, oropharynx clear, supple neck, boggy turbinates,  watery eyes  CV: irregularly irregular, regular rate Resp: CTABL, no wheezes, non-labored Skin: no rashes, normal turgor  Neuro: no gross deficits.       Assessment & Plan:

## 2015-04-09 ENCOUNTER — Ambulatory Visit (INDEPENDENT_AMBULATORY_CARE_PROVIDER_SITE_OTHER): Payer: Medicare Other | Admitting: Pharmacist Clinician (PhC)/ Clinical Pharmacy Specialist

## 2015-04-09 DIAGNOSIS — I480 Paroxysmal atrial fibrillation: Secondary | ICD-10-CM

## 2015-04-09 DIAGNOSIS — Z7901 Long term (current) use of anticoagulants: Secondary | ICD-10-CM | POA: Diagnosis not present

## 2015-04-09 LAB — POCT INR: INR: 1.7

## 2015-04-10 ENCOUNTER — Encounter: Payer: Self-pay | Admitting: Family Medicine

## 2015-04-10 ENCOUNTER — Telehealth: Payer: Self-pay | Admitting: Family Medicine

## 2015-04-10 DIAGNOSIS — H7490 Unspecified disorder of middle ear and mastoid, unspecified ear: Secondary | ICD-10-CM | POA: Insufficient documentation

## 2015-04-10 DIAGNOSIS — IMO0001 Reserved for inherently not codable concepts without codable children: Secondary | ICD-10-CM | POA: Insufficient documentation

## 2015-04-10 NOTE — Assessment & Plan Note (Signed)
Found incidentally on CT upon her previous admission. Recommended that she f/u with ENT.  - referral to ENT

## 2015-04-10 NOTE — Assessment & Plan Note (Signed)
TSH and Free T4 WNL. Continue current synthroid regimen.  - refilled today

## 2015-04-10 NOTE — Assessment & Plan Note (Signed)
Given information about scheduling a mammogram and colonoscopy. At risk for fracture so DEXA scan ordered.

## 2015-04-10 NOTE — Telephone Encounter (Signed)
Spoke with patient about her lab results.   Rosemarie Ax, MD PGY-2, [facility] 04/10/2015, 9:00 AM

## 2015-04-15 DIAGNOSIS — H6123 Impacted cerumen, bilateral: Secondary | ICD-10-CM | POA: Diagnosis not present

## 2015-04-23 ENCOUNTER — Ambulatory Visit (INDEPENDENT_AMBULATORY_CARE_PROVIDER_SITE_OTHER): Payer: Medicare Other | Admitting: Pharmacist Clinician (PhC)/ Clinical Pharmacy Specialist

## 2015-04-23 DIAGNOSIS — Z7901 Long term (current) use of anticoagulants: Secondary | ICD-10-CM | POA: Diagnosis not present

## 2015-04-23 DIAGNOSIS — I480 Paroxysmal atrial fibrillation: Secondary | ICD-10-CM | POA: Diagnosis not present

## 2015-04-23 LAB — POCT INR: INR: 2.2

## 2015-04-29 ENCOUNTER — Other Ambulatory Visit: Payer: Self-pay

## 2015-04-29 DIAGNOSIS — Z1231 Encounter for screening mammogram for malignant neoplasm of breast: Secondary | ICD-10-CM

## 2015-05-02 ENCOUNTER — Other Ambulatory Visit: Payer: Self-pay | Admitting: Family Medicine

## 2015-05-02 DIAGNOSIS — E039 Hypothyroidism, unspecified: Secondary | ICD-10-CM

## 2015-05-06 ENCOUNTER — Telehealth: Payer: Self-pay | Admitting: Family Medicine

## 2015-05-06 ENCOUNTER — Encounter: Payer: Self-pay | Admitting: Family Medicine

## 2015-05-06 ENCOUNTER — Telehealth: Payer: Self-pay | Admitting: Pharmacist Clinician (PhC)/ Clinical Pharmacy Specialist

## 2015-05-06 ENCOUNTER — Ambulatory Visit (INDEPENDENT_AMBULATORY_CARE_PROVIDER_SITE_OTHER): Payer: Medicare Other | Admitting: Family Medicine

## 2015-05-06 VITALS — BP 110/63 | HR 83 | Temp 98.2°F | Ht 63.0 in | Wt 196.3 lb

## 2015-05-06 DIAGNOSIS — R300 Dysuria: Secondary | ICD-10-CM

## 2015-05-06 DIAGNOSIS — I480 Paroxysmal atrial fibrillation: Secondary | ICD-10-CM

## 2015-05-06 DIAGNOSIS — N939 Abnormal uterine and vaginal bleeding, unspecified: Secondary | ICD-10-CM

## 2015-05-06 DIAGNOSIS — R319 Hematuria, unspecified: Secondary | ICD-10-CM | POA: Insufficient documentation

## 2015-05-06 DIAGNOSIS — N898 Other specified noninflammatory disorders of vagina: Secondary | ICD-10-CM

## 2015-05-06 LAB — POCT URINALYSIS DIPSTICK
Glucose, UA: NEGATIVE
Ketones, UA: NEGATIVE
Nitrite, UA: NEGATIVE
Protein, UA: 30
Spec Grav, UA: >=1.03
Urobilinogen, UA: 1
pH, UA: 6

## 2015-05-06 LAB — CBC WITH DIFFERENTIAL/PLATELET
Basophils Absolute: 0 10*3/uL (ref 0.0–0.1)
Basophils Relative: 0 % (ref 0–1)
Eosinophils Absolute: 0.2 10*3/uL (ref 0.0–0.7)
Eosinophils Relative: 2 % (ref 0–5)
HCT: 39.1 % (ref 36.0–46.0)
Hemoglobin: 13.1 g/dL (ref 12.0–15.0)
Lymphocytes Relative: 26 % (ref 12–46)
Lymphs Abs: 2.2 10*3/uL (ref 0.7–4.0)
MCH: 29.1 pg (ref 26.0–34.0)
MCHC: 33.5 g/dL (ref 30.0–36.0)
MCV: 86.9 fL (ref 78.0–100.0)
MPV: 10.8 fL (ref 8.6–12.4)
Monocytes Absolute: 0.8 10*3/uL (ref 0.1–1.0)
Monocytes Relative: 9 % (ref 3–12)
Neutro Abs: 5.4 10*3/uL (ref 1.7–7.7)
Neutrophils Relative %: 63 % (ref 43–77)
Platelets: 299 10*3/uL (ref 150–400)
RBC: 4.5 MIL/uL (ref 3.87–5.11)
RDW: 13.9 % (ref 11.5–15.5)
WBC: 8.5 10*3/uL (ref 4.0–10.5)

## 2015-05-06 LAB — POCT WET PREP (WET MOUNT): Clue Cells Wet Prep Whiff POC: NEGATIVE

## 2015-05-06 LAB — POCT INR: INR: 1.2

## 2015-05-06 LAB — POCT UA - MICROSCOPIC ONLY

## 2015-05-06 NOTE — Telephone Encounter (Signed)
I contacted patient about her test result. I recommended OTC Clotrimazole for few yeast. She agreed with plan. I will call her with urine culture result.

## 2015-05-06 NOTE — Telephone Encounter (Signed)
Pt called - developed vaginal bleeding over weekend.  Held warfarin for 2-3 days, saw GYN today, who advised her to restart warfarin.  Pt wanted to be sure this was okay.  Advised her to restart, explained that warfarin was not cause of bleed, just makes it bleed more.  Pt voiced understanding

## 2015-05-06 NOTE — Assessment & Plan Note (Signed)
No acute bleeding. INR subtherapeutic since she stopped her med. I advised her to get back on Coumadin and follow up soon with her Cardiologist who manages her coumadin. She agreed with plan.

## 2015-05-06 NOTE — Assessment & Plan Note (Addendum)
She has had this in the past. Pelvic U/S showed thickened endometrium till about 18m. Assessment today indicates less likelihood of uterine bleeding however pelvic U/S ordered. Depending on result I will recommend endometrial biopsy. [facility] Wet prep done due to small discharged on exam, positive for few yeast. Patient asymptomatic. I will recommend PV clotrimazole which she can get OTC. This was sent to her pharm.

## 2015-05-06 NOTE — Assessment & Plan Note (Signed)
UA showed large blood. ?? UTI. Urine sent to lab for culture. She will also need repeat UA if culture is negative. If UA is positive again for blood she will need urologic assessment to r/o malignancy.

## 2015-05-06 NOTE — Progress Notes (Signed)
Subjective:     Patient ID: [name], female   DOB: 16-Oct-1951, 64 y.o.   MRN: [id number]  HPI  Vaginal bleeding:Sexually active, no post coital bleeding. +Cramping like she is going to start period. LMP was 15 yrs ago. She had similar bleeding episode in 2012 which she got work up for. At times feels dizzy but not too back. Moderate bleeding. + dysuria yesterday but feels better today. Dysuria: She had burning of her urine yesterday once but none since then, denies any other urinary symptoms. Whenever she urinates sees blood in her urine, not sure if it is coming from her vagina. Afib:Occasional palpitations and rapid heart rate. No chest pain. She stopped her coumadin 4 days ago.She feels the bleeding is associated with her coumadin, each time she gets back on it she will bleed and once she stop her meds she stop bleeding but she had not stopped bleeding since Friday.  Current Outpatient Prescriptions on File Prior to Visit  Medication Sig Dispense Refill  . aspirin EC 81 MG tablet Take 1 tablet (81 mg total) by mouth daily.    Marland Kitchen atorvastatin (LIPITOR) 80 MG tablet Take 1 tablet (80 mg total) by mouth daily. (Patient not taking: Reported on 04/08/2015) 30 tablet 6  . cetirizine (ZYRTEC) 10 MG tablet Take 1 tablet (10 mg total) by mouth daily. 90 tablet 1  . clotrimazole (LOTRIMIN) 1 % cream Apply 1 application topically 2 (two) times daily. (Patient taking differently: Apply 1 application topically 2 (two) times daily as needed (for rash). ) 60 g 0  . cyclobenzaprine (FLEXERIL) 10 MG tablet Take 0.5-1 tablets (5-10 mg total) by mouth 3 (three) times daily as needed for muscle spasms. 30 tablet 0  . diltiazem (CARDIZEM CD) 300 MG 24 hr capsule Take 1 capsule (300 mg total) by mouth daily. 30 capsule 3  . fluticasone (FLONASE) 50 MCG/ACT nasal spray Place 2 sprays into both nostrils daily. 16 g 6  . guaiFENesin-codeine (CHERATUSSIN AC) 100-10 MG/5ML syrup Take 5 mLs by mouth 3 (three) times  daily as needed for cough. (Patient not taking: Reported on 04/08/2015) 120 mL 0  . ibuprofen (ADVIL,MOTRIN) 200 MG tablet Take 400 mg by mouth every 6 (six) hours as needed for pain.    Marland Kitchen ipratropium (ATROVENT) 0.06 % nasal spray Place 2 sprays into both nostrils 4 (four) times daily. Prn runny nose (Patient not taking: Reported on 04/08/2015) 15 mL 12  . levothyroxine (SYNTHROID, LEVOTHROID) 150 MCG tablet TAKE 1 TABLET BY MOUTH DAILY 90 tablet 1  . meclizine (ANTIVERT) 12.5 MG tablet Take 1 tablet (12.5 mg total) by mouth 2 (two) times daily as needed for dizziness. (Patient not taking: Reported on 04/08/2015) 30 tablet 0  . traMADol (ULTRAM) 50 MG tablet Take 1-2 tablets (50-100 mg total) by mouth every 6 (six) hours as needed. 20 tablet 0  . warfarin (COUMADIN) 5 MG tablet Take 5 mg by mouth daily.    . [DISCONTINUED] diltiazem (DILACOR XR) 120 MG 24 hr capsule Take 1 capsule (120 mg total) by mouth daily. 30 capsule 6   No current facility-administered medications on file prior to visit.   Past Medical History  Diagnosis Date  . Hypertension   . CAD (coronary artery disease)     a. reported hx of PCI in 2000 (?records);  b.  Myoview 5/10:  EF 71%, no ischemia  . HLD (hyperlipidemia)   . Hypothyroidism   . SVD (spontaneous vaginal delivery)  x 5  . Atrial fibrillation     Echo 5/11:  EF 65%, NL diast fxn  . Arthritis     wrist, hands - tx with otc meds  . Postmenopausal bleeding 12/07/2012  . MI, old 5  . Personal history of colonic adenoma 11/11/2004     Review of Systems  Respiratory: Negative.   Cardiovascular: Positive for palpitations. Negative for chest pain and leg swelling.  Gastrointestinal: Negative.   Genitourinary: Positive for dysuria, hematuria and vaginal bleeding. Negative for frequency.  All other systems reviewed and are negative.  Filed Vitals:   05/06/15 0944  BP: 110/63  Pulse: 83  Temp: 98.2 F (36.8 C)  TempSrc: Oral  Height: '5\' 3"'$  (1.6 m)    Weight: 196 lb 5 oz (89.047 kg)       Objective:   Physical Exam  Constitutional: She appears well-developed. No distress.  Cardiovascular: Normal rate and normal heart sounds.   No murmur heard. Pulmonary/Chest: Breath sounds normal. No respiratory distress. She has no wheezes. She exhibits no tenderness.  Abdominal: Soft. Bowel sounds are normal. She exhibits no distension and no mass. There is no tenderness.  Genitourinary: Vagina normal and uterus normal. There is no rash or lesion on the right labia. There is no rash or lesion on the left labia. Cervix exhibits discharge. Cervix exhibits no friability.    Musculoskeletal: Normal range of motion. She exhibits no edema.  Neurological: She is alert.  Vitals reviewed.      Assessment:     Vaginal bleeding Dysuria Hematuria Afib     Plan:     Check problem list.

## 2015-05-06 NOTE — Assessment & Plan Note (Signed)
UA shows small leukocytes. She is currently not having dysuria. Urine sent to lab for culture. I will treat based on test result.

## 2015-05-06 NOTE — Patient Instructions (Signed)
It was nice seeing you today, I am sorry about the bleeding. Your exam looks ok. I will call you with your urine test result for infection. If abnormal we will treat you for infection. Let us also get a pelvic U/S to ensure there is nothing going on with your uterus. You will need endometrial biopsy, but since you prefer to have it done at the [facility] hospital, I will wait till I get your test result and then refer you to them. Please take your coumadin as instructed by your cardiologist and follow up with them as instructed.

## 2015-05-07 ENCOUNTER — Ambulatory Visit
Admission: RE | Admit: 2015-05-07 | Discharge: 2015-05-07 | Disposition: A | Discharge status: Home / Self Care | Payer: Medicare Other | Source: Ambulatory Visit

## 2015-05-07 ENCOUNTER — Ambulatory Visit
Admission: RE | Admit: 2015-05-07 | Discharge: 2015-05-07 | Disposition: A | Discharge status: Home / Self Care | Payer: Medicare Other | Source: Ambulatory Visit | Attending: Family Medicine | Admitting: Family Medicine

## 2015-05-07 ENCOUNTER — Encounter: Payer: Self-pay | Admitting: Family Medicine

## 2015-05-07 DIAGNOSIS — Z1231 Encounter for screening mammogram for malignant neoplasm of breast: Secondary | ICD-10-CM | POA: Diagnosis not present

## 2015-05-07 DIAGNOSIS — E2839 Other primary ovarian failure: Secondary | ICD-10-CM

## 2015-05-07 DIAGNOSIS — M85851 Other specified disorders of bone density and structure, right thigh: Secondary | ICD-10-CM | POA: Diagnosis not present

## 2015-05-08 ENCOUNTER — Telehealth: Payer: Self-pay | Admitting: Family Medicine

## 2015-05-08 LAB — URINE CULTURE: Colony Count: 2000

## 2015-05-08 NOTE — Telephone Encounter (Signed)
Result discussed with patient. Urine was + for blood and leukocyte but urine culture showed insignificant growth.  1. ? Hematuria from kidney/bladder source vs Uterine. 2. I will await pelvic U/S result. If normal she will need to return for repeat UA. If still having hematuria, then she need urology referral for cystoscopy.

## 2015-05-13 ENCOUNTER — Telehealth: Payer: Self-pay | Admitting: Family Medicine

## 2015-05-13 ENCOUNTER — Telehealth: Payer: Self-pay | Admitting: *Deleted

## 2015-05-13 NOTE — Telephone Encounter (Signed)
Left Vm for patient. Was going to discuss her labs.   If she calls back, ask for nurse/cma. Based on her DEXA scan, she doesn't need to be treated with medications yet for her osteoporosis. She should be taking a vitamin D and calcium supplement. She should exercise with weight bearing exercises as this will build up her bone density.   If she has any questions please take a phone number and time and I will try to call her back.   Rosemarie Ax, MD PGY-2, [facility] 05/13/2015, 10:33 AM

## 2015-05-13 NOTE — Telephone Encounter (Signed)
Pt informed of dr. Raeford Razor message. [name] Holter, CMA

## 2015-05-15 ENCOUNTER — Ambulatory Visit (HOSPITAL_COMMUNITY)
Admission: RE | Admit: 2015-05-15 | Discharge: 2015-05-15 | Disposition: A | Discharge status: Home / Self Care | Payer: Medicare Other | Source: Ambulatory Visit | Attending: Family Medicine | Admitting: Family Medicine

## 2015-05-15 DIAGNOSIS — E669 Obesity, unspecified: Secondary | ICD-10-CM | POA: Diagnosis not present

## 2015-05-15 DIAGNOSIS — N939 Abnormal uterine and vaginal bleeding, unspecified: Secondary | ICD-10-CM

## 2015-05-15 DIAGNOSIS — R319 Hematuria, unspecified: Secondary | ICD-10-CM | POA: Diagnosis not present

## 2015-05-15 DIAGNOSIS — I4891 Unspecified atrial fibrillation: Secondary | ICD-10-CM | POA: Insufficient documentation

## 2015-05-15 DIAGNOSIS — Z7901 Long term (current) use of anticoagulants: Secondary | ICD-10-CM | POA: Insufficient documentation

## 2015-05-15 DIAGNOSIS — N950 Postmenopausal bleeding: Secondary | ICD-10-CM | POA: Insufficient documentation

## 2015-05-16 ENCOUNTER — Telehealth: Payer: Self-pay | Admitting: Family Medicine

## 2015-05-16 NOTE — Telephone Encounter (Signed)
Pelvic U/S result discussed with patient,it appears normal. Since her UA showed hematuria, it might be that the blood she saw is from the urinary system. I advised she follow up with her PCP in the next week or two to repeat UA, if still positive will need further work up for bladder cancer. She agreed with plan. She stated her bleeding stopped since she last saw me.  Note routed to PCP to follow up with patient.

## 2015-05-21 ENCOUNTER — Ambulatory Visit (INDEPENDENT_AMBULATORY_CARE_PROVIDER_SITE_OTHER): Payer: Medicare Other | Admitting: Pharmacist Clinician (PhC)/ Clinical Pharmacy Specialist

## 2015-05-21 DIAGNOSIS — I480 Paroxysmal atrial fibrillation: Secondary | ICD-10-CM | POA: Diagnosis not present

## 2015-05-21 DIAGNOSIS — Z7901 Long term (current) use of anticoagulants: Secondary | ICD-10-CM

## 2015-05-21 LAB — POCT INR: INR: 1.8

## 2015-06-03 ENCOUNTER — Ambulatory Visit (INDEPENDENT_AMBULATORY_CARE_PROVIDER_SITE_OTHER): Payer: Medicare Other | Admitting: Family Medicine

## 2015-06-03 ENCOUNTER — Encounter: Payer: Self-pay | Admitting: Family Medicine

## 2015-06-03 VITALS — BP 123/79 | HR 71 | Temp 97.9°F | Ht 63.0 in | Wt 197.0 lb

## 2015-06-03 DIAGNOSIS — M545 Low back pain, unspecified: Secondary | ICD-10-CM

## 2015-06-03 MED ORDER — METHYLPREDNISOLONE ACETATE 80 MG/ML IJ SUSP
80.0000 mg | Freq: Once | INTRAMUSCULAR | Status: AC
  Administered 2015-06-03: 80 mg via INTRAMUSCULAR

## 2015-06-03 NOTE — Progress Notes (Signed)
Patient ID: [name], female   DOB: 12-Sep-1951, 64 y.o.   MRN: [id number] Subjective:   CC: 1 week low back pain  HPI:   Sameday evaluation for low back pain present for 1 week, bilateral but R>L,. Usually, she gets this but finds relief with flexeril or tramadol in 2-3 days. It is actually mildly worsened over that time. Started 7 days ago when stood from sitting with no preceding trauma or increased activity or lifting. Radiates up to right mid-back and down to right posterior knee. Throbbing, sharp occasional burning pain. Heating pad and ice help some. She has stiffness as well. Massage worsens symptoms. ROM has been limited in all directions. Denies fevers, chills, dysuria, vaginal discharge, nausea, vomiting, leg weakness, bowel/bladder incontinence, or perineal numbness/tingling.  No h/o back surgeries or injections.  Review of Systems - Per HPI.   PMH - hypothyroidism, hyperlipidemia, hypertension, CAD, atrial fibrillation, obesity, history of MI, DJD left hip, depression, history of colon cancer, bradycardia, right low back pain,    Objective:  Physical Exam BP 123/79 mmHg  Pulse 71  Temp(Src) 97.9 F (36.6 C) (Oral)  Ht '5\' 3"'$  (1.6 m)  Wt 197 lb (89.359 kg)  BMI 34.91 kg/m2 GEN: NAD BACK: Generalized tenderness to palpation paraspinal muscles entire spine with no erythema, warmth or deformity. ROM limited to about 70 degrees fwd flexion, 5 degrees extension, 20 degrees left and right side bending. Rotation in both directions preserved but slow. No point spinal tenderness. SI joint>greater trochanteric tenderness on right. EXTR: 5/5 hip flexion against resistance Hip ROM preserved bilaterally with no reproduced pain Negative straight leg raise bilaterally Stiff but not antalgic gait  March 2016 DG lumbar IMPRESSION: Grade 1 anterior slip L4-5 secondary to disc and facet degeneration. Mild disc space narrowing L2-3 and L3-4.     Assessment:     [name] is a  64 y.o. female here for back pain.    Plan:     # See problem list and after visit summary for problem-specific plans.   # Health Maintenance: Not discsused  Follow-up: Follow up in 1 week if no improvement in symptoms.    Hilton Sinclair, MD [place]

## 2015-06-03 NOTE — Patient Instructions (Signed)
We are doing an intramuscular steroid injection for your pain today. This seems to be from a muscle spasm from some back arthritis. Please continue flexeril as needed (be careful operating machinery / driving as it can make you drowsy), heat, ice, stretching regularly, and trying to stay active with walking. Use aspercreme or ben gay to massage as well.  Follow up in 1 week if no better.  Best,  Hilton Sinclair, MD

## 2015-06-05 NOTE — Assessment & Plan Note (Addendum)
Known OA with Lumbar Xray Mar 2016 and grade 1 anterolisthesis. Current paraspinal muscle spasming. No red flags today. No radicular symptoms. -IM depomedrol injection today. Continue flexeril with caution of drowsiness. Continue heat/ice, stretching, stay active. -Suggested aspercreme or bengay gentle massage. -F/u 1 week if not improved or immediately if any red flags.

## 2015-06-11 ENCOUNTER — Ambulatory Visit (INDEPENDENT_AMBULATORY_CARE_PROVIDER_SITE_OTHER): Payer: Medicare Other | Admitting: Pharmacist Clinician (PhC)/ Clinical Pharmacy Specialist

## 2015-06-11 DIAGNOSIS — I480 Paroxysmal atrial fibrillation: Secondary | ICD-10-CM | POA: Diagnosis not present

## 2015-06-11 DIAGNOSIS — Z7901 Long term (current) use of anticoagulants: Secondary | ICD-10-CM | POA: Diagnosis not present

## 2015-06-11 LAB — POCT INR: INR: 2.2

## 2015-06-13 ENCOUNTER — Encounter: Payer: Self-pay | Admitting: Family Medicine

## 2015-06-19 ENCOUNTER — Other Ambulatory Visit: Payer: Self-pay | Admitting: Cardiology

## 2015-06-19 NOTE — Telephone Encounter (Signed)
REFILL 

## 2015-06-23 ENCOUNTER — Ambulatory Visit: Payer: Medicare Other | Admitting: Family Medicine

## 2015-06-26 NOTE — Progress Notes (Signed)
06/27/2015 [name]   25-Oct-1951  [id number]  Primary Physician Clearance Coots, MD Primary Cardiologist: Dr. Percival Spanish  Reason for Visit: Follow-up for Atrial Fibrillation and CAD  HPI:  The patient is a 64 y/o female, followed by Dr. Percival Spanish, who presents to clinic today for f/u for her atrial fibrillation and CAD. She has PAF and is on rate control therapy with Cardizem. This was last increased by Dr. Percival Spanish at her last [place] 01/2015 for additional rate control. Her dose was increased to 300 mg daily. She had previously been on Eliquis but she was later switched to Coumadin due to inability to afford her co pay. Her INR is followed in our office. She also has a reported h/o CAD with prior PCI in 2000. She had a Myoview in 04/2009 that was negative for ischemia. EF was normal. Her other past medical history is notable for HLD, hypothyroidism and DM which is diet controlled. Her last FLP was 04/11/2014. LDL was poorly controlled at 135. She is on high dose statin therapy with Lipitor. Her PCP follows her lipid profile.   Today in follow-up, she reports that she has done well. She has no awareness of her atrial fibrillation. She is fully compliant with Cardizem and Coumadin. She denies any abnormal bleeding and no falls. She also denies chest pain, resting dyspnea, orthopnea, PND, syncope/ near syncope.    Current Outpatient Prescriptions  Medication Sig Dispense Refill  . atorvastatin (LIPITOR) 80 MG tablet Take 1 tablet (80 mg total) by mouth daily. 30 tablet 6  . cyclobenzaprine (FLEXERIL) 10 MG tablet Take 0.5-1 tablets (5-10 mg total) by mouth 3 (three) times daily as needed for muscle spasms. 30 tablet 0  . diltiazem (CARTIA XT) 300 MG 24 hr capsule Take 1 capsule (300 mg total) by mouth daily. PLEASE SCHEDULE APPOINTMENT. THANKS. [phone]. 30 capsule 2  . fluticasone (FLONASE) 50 MCG/ACT nasal spray Place 2 sprays into both nostrils daily. 16 g 6  . levothyroxine (SYNTHROID,  LEVOTHROID) 150 MCG tablet TAKE 1 TABLET BY MOUTH DAILY 90 tablet 1  . traMADol (ULTRAM) 50 MG tablet Take 1-2 tablets (50-100 mg total) by mouth every 6 (six) hours as needed. 20 tablet 0  . warfarin (COUMADIN) 5 MG tablet Take 5 mg by mouth daily.    . [DISCONTINUED] diltiazem (DILACOR XR) 120 MG 24 hr capsule Take 1 capsule (120 mg total) by mouth daily. 30 capsule 6   No current facility-administered medications for this visit.    Allergies  Allergen Reactions  . Penicillins Itching  . Percocet [Oxycodone-Acetaminophen] Itching    History   Social History  . Marital Status: Married    Spouse Name: Eddie Dibbles  . Number of Children: 5  . Years of Education: 12   Occupational History  . works at a Forensic psychologist    Social History Main Topics  . Smoking status: Former Smoker -- 1.50 packs/day for 15 years    Types: Cigarettes    Quit date: 11/09/1981  . Smokeless tobacco: Never Used  . Alcohol Use: 1.8 oz/week    3 Cans of beer per week     Comment: weekly  . Drug Use: No  . Sexual Activity: Yes    Birth Control/ Protection: Surgical   Other Topics Concern  . 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  Has 5 adult children all living in the area.  Health Care POA:    Emergency Contact: husband, Anastyn Ayars, [phone]   End of Life Plan: discussed AD, gave pt pamphlet   Who lives with you: husband, two sons, granddaughter, and great-grandson.    Any pets: dalamation- Kisses   Diet: Pt has a varied diet of protein, starch and vegetables.   Exercise: Pt has no regular exercise routine.  Does wear fitbit and tracks steps, reports walking 12-13k steps a day when she is working.   Seatbelts: Pt reports wearing seatbelt when in vehicles.    Hobbies: cooking           Review of Systems: General: negative for chills, fever, night sweats or weight changes.  Cardiovascular: negative for chest pain, dyspnea on exertion, edema,  orthopnea, palpitations, paroxysmal nocturnal dyspnea or shortness of breath Dermatological: negative for rash Respiratory: negative for cough or wheezing Urologic: negative for hematuria Abdominal: negative for nausea, vomiting, diarrhea, bright red blood per rectum, melena, or hematemesis Neurologic: negative for visual changes, syncope, or dizziness All other systems reviewed and are otherwise negative except as noted above.    Blood pressure 136/84, pulse 60, height '5\' 3"'$  (1.6 m), weight 198 lb 1.6 oz (89.858 kg).   General appearance: alert, cooperative and no distress Neck: no carotid bruit and no JVD Lungs: clear to auscultation bilaterally Heart: irregularly irregular rhythm, regular rate S1, S2 normal, no murmur, click, rub or gallop Extremities: no LEE Pulses: 2+ and symmetric Skin: warm and dry Neurologic: Grossly normal  EKG atrial fibrillation. 72 bpm  ASSESSMENT AND PLAN:   1. PAF: rate controlled on Cardizem. 74 bpm. BP stable. Asymptomatic. Continue current dose of 300 mg/day.  Continue anticoagulation with Coumadin. INRs followed in our office. Scheduled for repeat check with pharmacist 07/09/15.  2. CAD: negative Myoview in 2010. No CP or exertional symptoms. Continue medical therapy.  3. HLD: last FLP was over 1 year ago and LDL was elevated at 135. Given her history of CAD, her LDL goal is <70 mg/dL. She has been on high dose statin therapy with Lipitor, 80 mg daily. She is scheduled for f/u with her PCP who follows her lipid profile.  I recommend that she gets this repeated along with HFTs. Also recommended heart healthy diet and regular exercise.  4. DM: diet controlled. Followed by PCP. Encouraged regular exercise (30 minutes a day).   PLAN  F/u with Dr. Percival Spanish in 6 months  Lyda Jester PA-C 06/27/2015 9:13 AM

## 2015-06-27 ENCOUNTER — Ambulatory Visit (INDEPENDENT_AMBULATORY_CARE_PROVIDER_SITE_OTHER): Payer: Medicare Other | Admitting: Cardiology

## 2015-06-27 ENCOUNTER — Encounter: Payer: Self-pay | Admitting: Cardiology

## 2015-06-27 VITALS — BP 136/84 | HR 60 | Ht 63.0 in | Wt 198.1 lb

## 2015-06-27 DIAGNOSIS — I482 Chronic atrial fibrillation, unspecified: Secondary | ICD-10-CM

## 2015-06-27 DIAGNOSIS — E785 Hyperlipidemia, unspecified: Secondary | ICD-10-CM

## 2015-06-27 MED ORDER — ATORVASTATIN CALCIUM 80 MG PO TABS: 80.0000 mg | ORAL_TABLET | Freq: Every day | ORAL | Status: DC

## 2015-06-27 NOTE — Patient Instructions (Signed)
Your physician wants you to follow-up in: 6 months or sooner if needed with Dr. Percival Spanish. You will receive a reminder letter in the mail two months in advance. If you don't receive a letter, please call our office to schedule the follow-up appointment.

## 2015-07-01 ENCOUNTER — Encounter: Payer: Self-pay | Admitting: Family Medicine

## 2015-07-01 ENCOUNTER — Ambulatory Visit (INDEPENDENT_AMBULATORY_CARE_PROVIDER_SITE_OTHER): Payer: Medicare Other | Admitting: Family Medicine

## 2015-07-01 VITALS — BP 125/66 | HR 71 | Temp 98.0°F | Ht 63.0 in | Wt 197.2 lb

## 2015-07-01 DIAGNOSIS — R7309 Other abnormal glucose: Secondary | ICD-10-CM | POA: Diagnosis not present

## 2015-07-01 DIAGNOSIS — F329 Major depressive disorder, single episode, unspecified: Secondary | ICD-10-CM | POA: Diagnosis not present

## 2015-07-01 DIAGNOSIS — E785 Hyperlipidemia, unspecified: Secondary | ICD-10-CM | POA: Diagnosis not present

## 2015-07-01 DIAGNOSIS — F330 Major depressive disorder, recurrent, mild: Secondary | ICD-10-CM

## 2015-07-01 DIAGNOSIS — R7303 Prediabetes: Secondary | ICD-10-CM

## 2015-07-01 DIAGNOSIS — F32A Depression, unspecified: Secondary | ICD-10-CM

## 2015-07-01 DIAGNOSIS — R739 Hyperglycemia, unspecified: Secondary | ICD-10-CM

## 2015-07-01 LAB — LIPID PANEL
Cholesterol: 229 mg/dL — ABNORMAL HIGH (ref 0–200)
HDL: 70 mg/dL (ref >=46)
LDL Cholesterol: 133 mg/dL — ABNORMAL HIGH (ref 0–99)
Total CHOL/HDL Ratio: 3.3 Ratio
Triglycerides: 129 mg/dL (ref <150)
VLDL: 26 mg/dL (ref 0–40)

## 2015-07-01 LAB — POCT GLYCOSYLATED HEMOGLOBIN (HGB A1C): Hemoglobin A1C: 6.2

## 2015-07-01 MED ORDER — SERTRALINE HCL 25 MG PO TABS: 25.0000 mg | ORAL_TABLET | Freq: Every day | ORAL | Status: DC

## 2015-07-01 NOTE — Progress Notes (Signed)
   Subjective:    Patient ID: [name], female    DOB: [birth year], 64 y.o.   MRN: [id number]  HPI  [name] is here for f/u.  Prediabetes.Recent A1c of 6.5 but wanting to try diet and exericse of reducign a1c. There is a reduction since last time. She walks at least 30 minutes 3-4 days per week. She has been eating differently. Denies any polyuria, polydipsia, or changes in her vision.   HLD: still taking statin. Reports no intolerance. She hasn't had her lipid profile checked in over a year. Has made diet changes as above.   Presenting Issue: She has been taking care of her 27 month old and 31 year old great grandchildren.  The mother of the 27 month old has a hx of bipolar and stopped taking her medications about a month ago. She has started drinking excessively. She will stay up all night drinking and then sleep during the day. The mother is not breastfeeding. The patient has custody of the 66 yo.   Report of symptoms: sadness, lack of sleep, increased appetite, lack of interest, no SI/HI,   Age of onset of first mood disturbance: remote hx of situational depression, hx of major depression upon chart review    Impact on function: feels overwhelmed   Psychiatric History - Diagnoses: ?situational depression - Previous treatment: prozac  - Hospitalizations:  none - Outpatient therapy: none  Family history of psychiatric issues: hx in grandchildren   Current and history of substance use: none   PHQ-9: 6  Current Outpatient Prescriptions on File Prior to Visit  Medication Sig Dispense Refill  . atorvastatin (LIPITOR) 80 MG tablet Take 1 tablet (80 mg total) by mouth daily. 30 tablet 6  . cyclobenzaprine (FLEXERIL) 10 MG tablet Take 0.5-1 tablets (5-10 mg total) by mouth 3 (three) times daily as needed for muscle spasms. 30 tablet 0  . diltiazem (CARTIA XT) 300 MG 24 hr capsule Take 1 capsule (300 mg total) by mouth daily. PLEASE SCHEDULE APPOINTMENT. THANKS. [phone].  30 capsule 2  . fluticasone (FLONASE) 50 MCG/ACT nasal spray Place 2 sprays into both nostrils daily. 16 g 6  . levothyroxine (SYNTHROID, LEVOTHROID) 150 MCG tablet TAKE 1 TABLET BY MOUTH DAILY 90 tablet 1  . traMADol (ULTRAM) 50 MG tablet Take 1-2 tablets (50-100 mg total) by mouth every 6 (six) hours as needed. 20 tablet 0  . warfarin (COUMADIN) 5 MG tablet Take 5 mg by mouth daily.    . [DISCONTINUED] diltiazem (DILACOR XR) 120 MG 24 hr capsule Take 1 capsule (120 mg total) by mouth daily. 30 capsule 6   No current facility-administered medications on file prior to visit.    SHx: not currently working   Health Maintenance: pna vaccine completed.   Review of Systems See HPI     Objective:   Physical Exam BP 125/66 mmHg  Pulse 71  Temp(Src) 98 F (36.7 C) (Oral)  Ht '5\' 3"'$  (1.6 m)  Wt 197 lb 3.2 oz (89.449 kg)  BMI 34.94 kg/m2 Gen: NAD, alert, cooperative with exam CV: Regular rate, irregularly irregular, good S1/S2, no murmur, no edema Resp: CTABL, no wheezes, non-labored Neuro: no gross deficits.  Psych: tearful during exam.       Assessment & Plan:

## 2015-07-01 NOTE — Patient Instructions (Signed)
Thank you for coming in,   I will call you with the results from today.   Follow up with me in two weeks.   Please bring all of your medications with you to each visit.    Please feel free to call with any questions or concerns at any time, at [phone]. --Dr. Raeford Razor

## 2015-07-01 NOTE — Assessment & Plan Note (Addendum)
Patient's PHQ-9 (score of 6) doesn't correlate to depression but upon chart review she has hx and treatment.  Patient would like to start a medication  - zoloft 25 mg QD  - given resources for counseling.  - encouraged to f/u with me in two weeks.  - discussed with Dr. Gwenlyn Saran.

## 2015-07-01 NOTE — Assessment & Plan Note (Signed)
Will check lipid profile today.

## 2015-07-01 NOTE — Assessment & Plan Note (Signed)
Improvement in A1c with diet and exercise.  - will continue this regimen as she seems focused  - discussed with her that if A1c doesn't improve or worsens then may need to start medication

## 2015-07-01 NOTE — Progress Notes (Signed)
Dr. Raeford Razor requested a behavioral health consultation and I met with the patient for about 20 minutes.  See Dr. Raeford Razor documentation below.  My additions / recommendations:  Lives with husband, son, granddaughter (this is the mom who is diagnosed with bipolar disorder and has a two-month old child), 66 year old and 12 month old great grandchildren.  Feels stuck and is afraid of having to raise another child.  Discussed that the best way for this situation to improve is for the granddaughter to get her mental health more stable so that she can take care of her baby.  This is not something Ms. Yetman can control.  Limit setting is difficult however she recently stated the granddaughter is not permitted to drink at the house.  She has plans to enforce this limit.  Symptoms seem to be a normal reaction to a complex and overwhelming situation.  Discussed with Dr. Raeford Razor the benefit / cost of medication vs. counseling.  Patient is interested in a medicine and I thought, given her interest, that Zoloft would be reasonable.  Identifying target symptoms (less tearfulness, more interest in things) would be helpful to determine if the medicine is indeed helping.  If not, consider discontinuing.  The patient and I discussed an antidepressant that might help her with initial onset insomnia but she does not want to feel "out of it."  With regards to counseling, she states she is interested and "has to follow through" with something to help herself.  Tthere will be barriers to getting counseling and it will not change the situation - hopefully her ability to manage the situation.  Provided three resources for therapy and gave her my number in case she runs into difficulty.  I will follow-up with her in one week by phone to see how she is doing.  Additionally, I told her if she schedules appointments on Mondays or Fridays, I would like to touch base with her.

## 2015-07-02 ENCOUNTER — Encounter: Payer: Self-pay | Admitting: Family Medicine

## 2015-07-04 NOTE — Progress Notes (Signed)
I have reviewed this visit and discussed with Lamont Dowdy and agree with documentation.

## 2015-07-09 ENCOUNTER — Ambulatory Visit (INDEPENDENT_AMBULATORY_CARE_PROVIDER_SITE_OTHER): Payer: Medicare Other | Admitting: Pharmacist Clinician (PhC)/ Clinical Pharmacy Specialist

## 2015-07-09 DIAGNOSIS — I480 Paroxysmal atrial fibrillation: Secondary | ICD-10-CM | POA: Diagnosis not present

## 2015-07-09 DIAGNOSIS — Z7901 Long term (current) use of anticoagulants: Secondary | ICD-10-CM

## 2015-07-09 LAB — POCT INR: INR: 1.6

## 2015-07-16 ENCOUNTER — Ambulatory Visit: Payer: Medicare Other | Admitting: Family Medicine

## 2015-07-23 ENCOUNTER — Telehealth: Payer: Self-pay | Admitting: Family Medicine

## 2015-07-23 ENCOUNTER — Ambulatory Visit (INDEPENDENT_AMBULATORY_CARE_PROVIDER_SITE_OTHER): Payer: Medicare Other | Admitting: Pharmacist Clinician (PhC)/ Clinical Pharmacy Specialist

## 2015-07-23 DIAGNOSIS — I480 Paroxysmal atrial fibrillation: Secondary | ICD-10-CM

## 2015-07-23 DIAGNOSIS — Z7901 Long term (current) use of anticoagulants: Secondary | ICD-10-CM | POA: Diagnosis not present

## 2015-07-23 LAB — POCT INR: INR: 2.8

## 2015-07-23 NOTE — Telephone Encounter (Signed)
Called pt today on behalf of the [facility], as I noticed she canceled her 2 week fu with PCP; she answered and rescheduled for 8/3.   Dr. Gwenlyn Saran -  This is updated in registry  Thank you, Fonda Kinder, ASA

## 2015-07-23 NOTE — Telephone Encounter (Signed)
Called pt today on behalf of the [facility] [facility], as I noticed she canceled her 2 week fu with PCP; she answered and rescheduled for 8/3.   Dr. Gwenlyn Saran - This is updated in registry  Thank you, Fonda Kinder, ASA

## 2015-07-30 ENCOUNTER — Ambulatory Visit (INDEPENDENT_AMBULATORY_CARE_PROVIDER_SITE_OTHER): Payer: Medicare Other | Admitting: Family Medicine

## 2015-07-30 ENCOUNTER — Telehealth: Payer: Self-pay | Admitting: Family Medicine

## 2015-07-30 VITALS — BP 130/75 | HR 84 | Temp 98.2°F | Ht 63.0 in | Wt 198.7 lb

## 2015-07-30 DIAGNOSIS — R21 Rash and other nonspecific skin eruption: Secondary | ICD-10-CM | POA: Diagnosis not present

## 2015-07-30 DIAGNOSIS — F330 Major depressive disorder, recurrent, mild: Secondary | ICD-10-CM

## 2015-07-30 NOTE — Progress Notes (Signed)
Subjective:    [name] - 64 y.o. female MRN [id number]  Date of birth: August 19, 1951  HPI  [name] is here for depression and rash   Presenting Issue: Depression   Has been going to family services where her son goes and received counseling two times. She reports that this has been helping. She just returned from a trip from [place] [place].   Report of symptoms: none  Duration of CURRENT symptoms: currently resolved.  Age of onset of first mood disturbance: roughly 4 years ago.   Impact on function: none today   Psychiatric History - Diagnoses: Hx of MDD,  - Hospitalizations:  no - Pharmacotherapy: taking zoloft 25 mg about a month ago  - Outpatient therapy: yes  Family history of psychiatric issues: granddaughter with bipolar   Current and history of substance use: none  PHQ-9: 0  RASH  Had rash for about a month.  Location: started on left anterior thigh and now on right leg and forearm  Medications tried: tried cream  Similar rash in past: allergic rxn  New medications or antibiotics: started on zoloft about a month ago.  Tick, Insect or new pet exposure: denies  Recent travel: just recently returned from [facility] detergent or soap: no Immunocompromised: no  Symptoms Itching: no Pain over rash: no Feeling ill all over: no Fever: no Mouth sores: no Face or tongue swelling: no Trouble breathing: no Joint swelling or pain: no  PMH - Smoking status noted.    Health Maintenance:  Health Maintenance Due  Topic Date Due  . PNA vac Low Risk Adult (2 of 2 - PPSV23) 04/24/2015  . INFLUENZA VACCINE  07/28/2015    -  reports that she quit smoking about 33 years ago. Her smoking use included Cigarettes. She has a 22.5 pack-year smoking history. She has never used smokeless tobacco. - Review of Systems: Per HPI. All other systems reviewed and are negative. - Past Medical History: Patient Active Problem List   Diagnosis Date Noted  . Abnormal  vaginal bleeding 05/06/2015  . Hematuria 05/06/2015  . Disease of middle ear or mastoid 04/10/2015  . Long-term (current) use of anticoagulants 02/19/2015  . Long QT interval 11/08/2014  . Bradycardia, sinus   . Family history of colon cancer - mother in 76's 06/04/2014  . Prediabetes 04/11/2014  . Atrial fibrillation 12/18/2013  . Depression, major 04/19/2012  . Colon polyps 05/06/2011  . Postmenopausal bleeding 05/06/2011  . DEGENERATIVE JOINT DISEASE, LEFT HIP 07/20/2010  . HLD (hyperlipidemia) 04/29/2009  . OBESITY, UNSPECIFIED 04/29/2009  . Essential hypertension, benign 04/29/2009  . Coronary atherosclerosis 04/29/2009  . Hypothyroidism 02/23/2007  . MYOCARDIAL INFARCTION, OLD 02/23/2007  . Personal history of colonic adenoma 11/11/2004   - Medications: reviewed and updated Current Outpatient Prescriptions  Medication Sig Dispense Refill  . atorvastatin (LIPITOR) 80 MG tablet Take 1 tablet (80 mg total) by mouth daily. 30 tablet 6  . cyclobenzaprine (FLEXERIL) 10 MG tablet Take 0.5-1 tablets (5-10 mg total) by mouth 3 (three) times daily as needed for muscle spasms. 30 tablet 0  . diltiazem (CARTIA XT) 300 MG 24 hr capsule Take 1 capsule (300 mg total) by mouth daily. PLEASE SCHEDULE APPOINTMENT. THANKS. [phone]. 30 capsule 2  . fluticasone (FLONASE) 50 MCG/ACT nasal spray Place 2 sprays into both nostrils daily. 16 g 6  . levothyroxine (SYNTHROID, LEVOTHROID) 150 MCG tablet TAKE 1 TABLET BY MOUTH DAILY 90 tablet 1  . sertraline (ZOLOFT) 25  MG tablet Take 1 tablet (25 mg total) by mouth daily. 30 tablet 1  . traMADol (ULTRAM) 50 MG tablet Take 1-2 tablets (50-100 mg total) by mouth every 6 (six) hours as needed. 20 tablet 0  . warfarin (COUMADIN) 5 MG tablet Take 5 mg by mouth daily.    . [DISCONTINUED] diltiazem (DILACOR XR) 120 MG 24 hr capsule Take 1 capsule (120 mg total) by mouth daily. 30 capsule 6   No current facility-administered medications for this visit.      Review of Systems See HPI     Objective:   Physical Exam BP 130/75 mmHg  Pulse 84  Temp(Src) 98.2 F (36.8 C) (Oral)  Ht '5\' 3"'$  (1.6 m)  Wt 198 lb 11.2 oz (90.13 kg)  BMI 35.21 kg/m2 Gen: NAD, alert, cooperative with exam, well-appearing Skin: macular rash on anterior left thigh and anterior b/l forearm, no scaling or central clearing.  Neuro: no gross deficits.  Psych: good insight, alert and oriented      Assessment & Plan:

## 2015-07-30 NOTE — Assessment & Plan Note (Signed)
Non itching and not painful. She incidentally noticed it.  Could be related to medication as zoloft was started about a month ago  Her statin was changed to a different kind of generic about a month ago as well  Not bothering her  - will f/u in 3-4 weeks. If no improvement may need to consider changing zoloft

## 2015-07-30 NOTE — Telephone Encounter (Signed)
Patient dropped off handicap placard to be filled out.  Please mail to her when completed.

## 2015-07-30 NOTE — Patient Instructions (Signed)
Thank you for coming in,   Please call me if your rash persists for more than three weeks from now. It may be a drug rash and we may need to tailor your medications.   Please follow up with me in one month in regards to your depression.   Please bring all of your medications with you to each visit.    Please feel free to call with any questions or concerns at any time, at [phone]. --Dr. Raeford Razor

## 2015-07-30 NOTE — Assessment & Plan Note (Signed)
Currently no symptoms and receiving therapy at family services.  Is reporting a rash which could be associated with medication  She will inform in 3-4 weeks if rash is improving or persisting.  - will continue zoloft at current dose  - f/u in one month

## 2015-07-31 NOTE — Telephone Encounter (Signed)
Forms placed in PCP box. [name], [name], [name]

## 2015-08-06 NOTE — Telephone Encounter (Signed)
Left VM for patient. If she calls back please have her speak with a nurse/CMA and ask what is the handicap that qualifies her for a parking placard.   If any questions then please take the best time and phone number to call and I will try to call her back.   Rosemarie Ax, MD PGY-3, [facility] 08/06/2015, 1:36 PM

## 2015-08-07 NOTE — Telephone Encounter (Signed)
Pt is returning the doctors call and said that her heart doctor states she qualifies. She would like [place] to mail back the form to her address and she will get the heart doctor to fill this out. [name]

## 2015-08-08 DIAGNOSIS — H524 Presbyopia: Secondary | ICD-10-CM | POA: Diagnosis not present

## 2015-08-11 ENCOUNTER — Ambulatory Visit (INDEPENDENT_AMBULATORY_CARE_PROVIDER_SITE_OTHER): Payer: Medicare Other | Admitting: Pharmacist Clinician (PhC)/ Clinical Pharmacy Specialist

## 2015-08-11 DIAGNOSIS — I480 Paroxysmal atrial fibrillation: Secondary | ICD-10-CM | POA: Diagnosis not present

## 2015-08-11 DIAGNOSIS — Z7901 Long term (current) use of anticoagulants: Secondary | ICD-10-CM | POA: Diagnosis not present

## 2015-08-11 LAB — POCT INR: INR: 3

## 2015-08-26 NOTE — Telephone Encounter (Signed)
Do you know where the forms are for the placard?  Ottis Stain, CMA

## 2015-08-26 NOTE — Telephone Encounter (Signed)
Pt hasnt received the form for the handicap placard.  She requested it to be mailed back when dr schmitz said he could not fill it ou She would like to come by and pick it up

## 2015-08-27 NOTE — Telephone Encounter (Signed)
Pt is calling back to see where her form is. [name]

## 2015-08-28 NOTE — Telephone Encounter (Signed)
Form placed in envelope to be mailed back to Ms. Ronnald Ramp.   Rosemarie Ax, MD PGY-3, [facility] 08/28/2015, 1:41 PM

## 2015-08-30 ENCOUNTER — Other Ambulatory Visit: Payer: Self-pay | Admitting: Cardiology

## 2015-09-03 ENCOUNTER — Ambulatory Visit (INDEPENDENT_AMBULATORY_CARE_PROVIDER_SITE_OTHER): Payer: Medicare Other | Admitting: Family Medicine

## 2015-09-03 ENCOUNTER — Encounter: Payer: Self-pay | Admitting: Family Medicine

## 2015-09-03 VITALS — BP 119/73 | HR 87 | Temp 98.3°F | Ht 63.0 in | Wt 201.2 lb

## 2015-09-03 DIAGNOSIS — R319 Hematuria, unspecified: Secondary | ICD-10-CM

## 2015-09-03 DIAGNOSIS — F330 Major depressive disorder, recurrent, mild: Secondary | ICD-10-CM | POA: Diagnosis not present

## 2015-09-03 DIAGNOSIS — Z23 Encounter for immunization: Secondary | ICD-10-CM

## 2015-09-03 DIAGNOSIS — Z0000 Encounter for general adult medical examination without abnormal findings: Secondary | ICD-10-CM

## 2015-09-03 LAB — POCT URINALYSIS DIPSTICK
Glucose, UA: NEGATIVE
Nitrite, UA: NEGATIVE
Protein, UA: 30
Spec Grav, UA: >=1.03
Urobilinogen, UA: 1
pH, UA: 5.5

## 2015-09-03 LAB — POCT UA - MICROSCOPIC ONLY

## 2015-09-03 NOTE — Addendum Note (Signed)
Addended by: [place] Pink on: 09/03/2015 10:03 AM   Modules accepted: Orders

## 2015-09-03 NOTE — Assessment & Plan Note (Signed)
Controlled.  She is meeting with family counseling and is doing well  She finds enjoyment with playing with her great grandchildren  - will stop her zoloft as she hasn't been taking it.  - f/u PRN for now.

## 2015-09-03 NOTE — Assessment & Plan Note (Signed)
Hx of chronic afib on coumadin.  Former smoker that quit in the '80's  - UA  - if positive then may need to refer to urology

## 2015-09-03 NOTE — Patient Instructions (Signed)
Thank you for coming in,   I'm glad that everything is going well for you.   Please let me know if there is anything I can do for you.   You can follow up with me in about six months.   Please bring all of your medications with you to each visit.    Please feel free to call with any questions or concerns at any time, at [phone]. --Dr. Raeford Razor

## 2015-09-03 NOTE — Progress Notes (Signed)
Subjective:    [name] - 64 y.o. female MRN [id number]  Date of birth: 04/21/1951  HPI  [name] is here for depression f/u.   Presenting Issue: hx of depression  Report of symptoms: none   Duration of CURRENT symptoms: have resolved  Age of onset of first mood disturbance: she is unsure. Hx noted about three years ago.   Impact on function: currently none. She enjoys playing with her great grandchildren. Her granddaughter is expecting and is due in November. The pregnant granddaughter has a history of heroin use but is now on a Suboxone clinic. She reports that meeting with the family counselor has been helping her through this.  Psychiatric History - Diagnoses: MDD  - Hospitalizations:  None  - Pharmacotherapy: has stopped taking zoloft a few weeks ago  - Outpatient therapy: meets with family counseling.   Family history of psychiatric issues: unsure   Current and history of substance use:no   Other:n/a   PHQ-9:0 GAD7:n/a   Hematuria: Found on urinalysis in March 2016. Urine culture at that time was negative. She has a history of using Coumadin for chronic A. fib. She has stopped taking the Coumadin for a while because she was having some blood on the sanitary napkin while wiping. She is started taking the Coumadin again and notices it from time to time.  Health Maintenance:  Health Maintenance Due  Topic Date Due  . Hepatitis C Screening  1951/06/16  . PNA vac Low Risk Adult (2 of 2 - PPSV23) 04/24/2015  . INFLUENZA VACCINE  07/28/2015    -  reports that she quit smoking about 33 years ago. Her smoking use included Cigarettes. She has a 22.5 pack-year smoking history. She has never used smokeless tobacco. - Review of Systems: Per HPI. - Past Medical History: Patient Active Problem List   Diagnosis Date Noted  . Abnormal vaginal bleeding 05/06/2015  . Hematuria 05/06/2015  . Disease of middle ear or mastoid 04/10/2015  . Long-term (current)  use of anticoagulants 02/19/2015  . Long QT interval 11/08/2014  . Bradycardia, sinus   . Family history of colon cancer - mother in 59's 06/04/2014  . Prediabetes 04/11/2014  . Atrial fibrillation 12/18/2013  . Depression, major 04/19/2012  . Colon polyps 05/06/2011  . Postmenopausal bleeding 05/06/2011  . DEGENERATIVE JOINT DISEASE, LEFT HIP 07/20/2010  . HLD (hyperlipidemia) 04/29/2009  . OBESITY, UNSPECIFIED 04/29/2009  . Essential hypertension, benign 04/29/2009  . Coronary atherosclerosis 04/29/2009  . Hypothyroidism 02/23/2007  . MYOCARDIAL INFARCTION, OLD 02/23/2007  . Personal history of colonic adenoma 11/11/2004   - Medications: reviewed and updated Current Outpatient Prescriptions  Medication Sig Dispense Refill  . atorvastatin (LIPITOR) 80 MG tablet Take 1 tablet (80 mg total) by mouth daily. 30 tablet 6  . CARTIA XT 300 MG 24 hr capsule TAKE 1 CAPSULE(300 MG) BY MOUTH DAILY 30 capsule 5  . cyclobenzaprine (FLEXERIL) 10 MG tablet Take 0.5-1 tablets (5-10 mg total) by mouth 3 (three) times daily as needed for muscle spasms. 30 tablet 0  . fluticasone (FLONASE) 50 MCG/ACT nasal spray Place 2 sprays into both nostrils daily. 16 g 6  . levothyroxine (SYNTHROID, LEVOTHROID) 150 MCG tablet TAKE 1 TABLET BY MOUTH DAILY 90 tablet 1  . traMADol (ULTRAM) 50 MG tablet Take 1-2 tablets (50-100 mg total) by mouth every 6 (six) hours as needed. 20 tablet 0  . warfarin (COUMADIN) 5 MG tablet Take 5 mg by mouth daily.    . [  DISCONTINUED] diltiazem (DILACOR XR) 120 MG 24 hr capsule Take 1 capsule (120 mg total) by mouth daily. 30 capsule 6   No current facility-administered medications for this visit.     Review of Systems See HPI     Objective:   Physical Exam BP 119/73 mmHg  Pulse 87  Temp(Src) 98.3 F (36.8 C) (Oral)  Ht '5\' 3"'$  (1.6 m)  Wt 201 lb 3.2 oz (91.264 kg)  BMI 35.65 kg/m2 Gen: NAD, alert, cooperative with exam, well-appearing Neuro: no gross deficits.  Psych:  good insight, alert and oriented    Assessment & Plan:   Hematuria Hx of chronic afib on coumadin.  Former smoker that quit in the '80's  - UA  - if positive then may need to refer to urology   Depression, major Controlled.  She is meeting with family counseling and is doing well  She finds enjoyment with playing with her great grandchildren  - will stop her zoloft as she hasn't been taking it.  - f/u PRN for now.

## 2015-09-03 NOTE — Addendum Note (Signed)
Addended by: Londell Moh T on: 09/03/2015 05:08 PM   Modules accepted: Orders, SmartSet

## 2015-09-04 ENCOUNTER — Telehealth: Payer: Self-pay | Admitting: Family Medicine

## 2015-09-04 DIAGNOSIS — R319 Hematuria, unspecified: Secondary | ICD-10-CM

## 2015-09-04 LAB — URINE CULTURE: Colony Count: 25000

## 2015-09-04 NOTE — Telephone Encounter (Signed)
Spoke with patient about her hematuria. Still having hematuria and previous urine culture has been negative. Distant smoking history and on coumadin but will go ahead and refer to urology.   Rosemarie Ax, MD PGY-3, [facility] 09/04/2015, 10:18 AM

## 2015-09-08 ENCOUNTER — Ambulatory Visit (INDEPENDENT_AMBULATORY_CARE_PROVIDER_SITE_OTHER): Payer: Medicare Other | Admitting: Pharmacist Clinician (PhC)/ Clinical Pharmacy Specialist

## 2015-09-08 DIAGNOSIS — I480 Paroxysmal atrial fibrillation: Secondary | ICD-10-CM | POA: Diagnosis not present

## 2015-09-08 DIAGNOSIS — Z7901 Long term (current) use of anticoagulants: Secondary | ICD-10-CM

## 2015-09-08 LAB — POCT INR: INR: 2.3

## 2015-09-11 ENCOUNTER — Observation Stay (HOSPITAL_COMMUNITY)
Admission: EM | Admit: 2015-09-11 | Discharge: 2015-09-12 | Disposition: A | Discharge status: Home / Self Care | Payer: Medicare Other | Attending: Family Medicine | Admitting: Family Medicine

## 2015-09-11 ENCOUNTER — Emergency Department (HOSPITAL_COMMUNITY): Payer: Medicare Other

## 2015-09-11 ENCOUNTER — Encounter (HOSPITAL_COMMUNITY): Payer: Self-pay | Admitting: Emergency Medicine

## 2015-09-11 DIAGNOSIS — R072 Precordial pain: Secondary | ICD-10-CM

## 2015-09-11 DIAGNOSIS — Z7951 Long term (current) use of inhaled steroids: Secondary | ICD-10-CM | POA: Diagnosis not present

## 2015-09-11 DIAGNOSIS — Z7901 Long term (current) use of anticoagulants: Secondary | ICD-10-CM | POA: Diagnosis not present

## 2015-09-11 DIAGNOSIS — E039 Hypothyroidism, unspecified: Secondary | ICD-10-CM | POA: Diagnosis not present

## 2015-09-11 DIAGNOSIS — R55 Syncope and collapse: Principal | ICD-10-CM

## 2015-09-11 DIAGNOSIS — M1990 Unspecified osteoarthritis, unspecified site: Secondary | ICD-10-CM | POA: Insufficient documentation

## 2015-09-11 DIAGNOSIS — I10 Essential (primary) hypertension: Secondary | ICD-10-CM | POA: Diagnosis not present

## 2015-09-11 DIAGNOSIS — Z87891 Personal history of nicotine dependence: Secondary | ICD-10-CM | POA: Diagnosis not present

## 2015-09-11 DIAGNOSIS — I2510 Atherosclerotic heart disease of native coronary artery without angina pectoris: Secondary | ICD-10-CM | POA: Diagnosis not present

## 2015-09-11 DIAGNOSIS — I481 Persistent atrial fibrillation: Secondary | ICD-10-CM | POA: Diagnosis not present

## 2015-09-11 DIAGNOSIS — E785 Hyperlipidemia, unspecified: Secondary | ICD-10-CM | POA: Diagnosis not present

## 2015-09-11 DIAGNOSIS — R05 Cough: Secondary | ICD-10-CM | POA: Diagnosis not present

## 2015-09-11 DIAGNOSIS — R079 Chest pain, unspecified: Secondary | ICD-10-CM

## 2015-09-11 DIAGNOSIS — Z79899 Other long term (current) drug therapy: Secondary | ICD-10-CM | POA: Diagnosis not present

## 2015-09-11 DIAGNOSIS — Z880 Allergy status to penicillin: Secondary | ICD-10-CM | POA: Diagnosis not present

## 2015-09-11 DIAGNOSIS — I4891 Unspecified atrial fibrillation: Secondary | ICD-10-CM | POA: Diagnosis not present

## 2015-09-11 DIAGNOSIS — N950 Postmenopausal bleeding: Secondary | ICD-10-CM | POA: Diagnosis not present

## 2015-09-11 DIAGNOSIS — Z86010 Personal history of colonic polyps: Secondary | ICD-10-CM | POA: Diagnosis not present

## 2015-09-11 HISTORY — PX: TRANSTHORACIC ECHOCARDIOGRAM: SHX275

## 2015-09-11 LAB — URINALYSIS, ROUTINE W REFLEX MICROSCOPIC
Bilirubin Urine: NEGATIVE
Glucose, UA: NEGATIVE mg/dL
Ketones, ur: NEGATIVE mg/dL
Nitrite: NEGATIVE
Protein, ur: NEGATIVE mg/dL
Specific Gravity, Urine: 1.016 (ref 1.005–1.030)
Urobilinogen, UA: 1 mg/dL (ref 0.0–1.0)
pH: 5.5 (ref 5.0–8.0)

## 2015-09-11 LAB — CBC WITH DIFFERENTIAL/PLATELET
Basophils Absolute: 0 10*3/uL (ref 0.0–0.1)
Basophils Relative: 0 %
Eosinophils Absolute: 0.1 10*3/uL (ref 0.0–0.7)
Eosinophils Relative: 1 %
HCT: 37.9 % (ref 36.0–46.0)
Hemoglobin: 12.7 g/dL (ref 12.0–15.0)
Lymphocytes Relative: 18 %
Lymphs Abs: 2.1 10*3/uL (ref 0.7–4.0)
MCH: 29.5 pg (ref 26.0–34.0)
MCHC: 33.5 g/dL (ref 30.0–36.0)
MCV: 88.1 fL (ref 78.0–100.0)
Monocytes Absolute: 1.1 10*3/uL — ABNORMAL HIGH (ref 0.1–1.0)
Monocytes Relative: 9 %
Neutro Abs: 8.4 10*3/uL — ABNORMAL HIGH (ref 1.7–7.7)
Neutrophils Relative %: 71 %
Platelets: 262 10*3/uL (ref 150–400)
RBC: 4.3 MIL/uL (ref 3.87–5.11)
RDW: 13.3 % (ref 11.5–15.5)
WBC: 11.8 10*3/uL — ABNORMAL HIGH (ref 4.0–10.5)

## 2015-09-11 LAB — URINE MICROSCOPIC-ADD ON

## 2015-09-11 LAB — PROTIME-INR
INR: 1.77 — ABNORMAL HIGH (ref 0.00–1.49)
Prothrombin Time: 20.5 seconds — ABNORMAL HIGH (ref 11.6–15.2)

## 2015-09-11 LAB — BASIC METABOLIC PANEL
Anion gap: 8 (ref 5–15)
BUN: 13 mg/dL (ref 6–20)
CO2: 23 mmol/L (ref 22–32)
Calcium: 9.1 mg/dL (ref 8.9–10.3)
Chloride: 108 mmol/L (ref 101–111)
Creatinine, Ser: 0.98 mg/dL (ref 0.44–1.00)
GFR calc Af Amer: >60 mL/min (ref >60)
GFR calc non Af Amer: 59 mL/min — ABNORMAL LOW (ref >60)
Glucose, Bld: 109 mg/dL — ABNORMAL HIGH (ref 65–99)
Potassium: 4.1 mmol/L (ref 3.5–5.1)
Sodium: 139 mmol/L (ref 135–145)

## 2015-09-11 LAB — I-STAT TROPONIN, ED: Troponin i, poc: 0 ng/mL (ref 0.00–0.08)

## 2015-09-11 LAB — MAGNESIUM: Magnesium: 2 mg/dL (ref 1.7–2.4)

## 2015-09-11 LAB — TROPONIN I: Troponin I: <0.03 ng/mL (ref <0.031)

## 2015-09-11 LAB — CBG MONITORING, ED: Glucose-Capillary: 99 mg/dL (ref 65–99)

## 2015-09-11 MED ORDER — ATORVASTATIN CALCIUM 80 MG PO TABS
80.0000 mg | ORAL_TABLET | Freq: Every day | ORAL | Status: DC
  Administered 2015-09-11 – 2015-09-12 (×2): 80 mg via ORAL
  Filled 2015-09-11 (×3): qty 1

## 2015-09-11 MED ORDER — SODIUM CHLORIDE 0.9 % IV SOLN
INTRAVENOUS | Status: AC
  Administered 2015-09-11: 17:00:00 via INTRAVENOUS

## 2015-09-11 MED ORDER — SODIUM CHLORIDE 0.9 % IV SOLN: 250.0000 mL | INTRAVENOUS | Status: DC | PRN

## 2015-09-11 MED ORDER — WARFARIN - PHARMACIST DOSING INPATIENT: Freq: Every day | Status: DC

## 2015-09-11 MED ORDER — CYCLOBENZAPRINE HCL 10 MG PO TABS: 5.0000 mg | ORAL_TABLET | Freq: Three times a day (TID) | ORAL | Status: DC | PRN

## 2015-09-11 MED ORDER — WARFARIN SODIUM 10 MG PO TABS
10.0000 mg | ORAL_TABLET | Freq: Once | ORAL | Status: AC
  Administered 2015-09-11: 10 mg via ORAL
  Filled 2015-09-11: qty 1

## 2015-09-11 MED ORDER — LEVOTHYROXINE SODIUM 75 MCG PO TABS
150.0000 ug | ORAL_TABLET | Freq: Every day | ORAL | Status: DC
Start: 2015-09-12 — End: 2015-09-12
  Administered 2015-09-12: 150 ug via ORAL
  Filled 2015-09-11: qty 2

## 2015-09-11 MED ORDER — SODIUM CHLORIDE 0.9 % IJ SOLN: 3.0000 mL | INTRAMUSCULAR | Status: DC | PRN

## 2015-09-11 MED ORDER — DILTIAZEM HCL ER COATED BEADS 180 MG PO CP24
300.0000 mg | ORAL_CAPSULE | Freq: Every day | ORAL | Status: DC
  Administered 2015-09-11 – 2015-09-12 (×2): 300 mg via ORAL
  Filled 2015-09-11 (×4): qty 1

## 2015-09-11 MED ORDER — ENOXAPARIN SODIUM 40 MG/0.4ML ~~LOC~~ SOLN: 40.0000 mg | SUBCUTANEOUS | Status: DC

## 2015-09-11 MED ORDER — SODIUM CHLORIDE 0.9 % IJ SOLN
3.0000 mL | Freq: Two times a day (BID) | INTRAMUSCULAR | Status: DC
  Administered 2015-09-11: 3 mL via INTRAVENOUS

## 2015-09-11 MED ORDER — TRAMADOL HCL 50 MG PO TABS: 50.0000 mg | ORAL_TABLET | Freq: Four times a day (QID) | ORAL | Status: DC | PRN

## 2015-09-11 NOTE — ED Provider Notes (Signed)
CSN: [id number]     Arrival date & time 09/11/15  1043 History   First MD Initiated Contact with Patient 09/11/15 1044     Chief Complaint  Patient presents with  . Near Syncope     (Consider location/radiation/quality/duration/timing/severity/associated sxs/prior Treatment) HPI   Pt with hx CAD, afib on coumadin p/w near syncope, lightheadedness, palpitations (heart racing).  Developed central chest pain that feels like indigestion while I was speaking with her at 11am.  Started "feeling bad" with lightheadedness, nasal congestion, cough last week, the day after getting PNA vx at her PCP.  Does note everyone in her house is sick with same.  Had 10 minutes of blurry vision in her left eye and pain in the left side of her forehead/behind her eye this morning that completely resolved - no diplopia or visual field loss. Denies fevers, chills, myalgias, headache, abdominal pain, n/V/D, urinary or bowel symptoms.  Denies weakness or numbness of the extremities.  Cardiologist Dr Percival Spanish PCP [facility], Dr Raeford Razor.    Past Medical History  Diagnosis Date  . Hypertension   . CAD (coronary artery disease)     a. reported hx of PCI in 2000 (?records);  b.  Myoview 5/10:  EF 71%, no ischemia  . HLD (hyperlipidemia)   . Hypothyroidism   . SVD (spontaneous vaginal delivery)     x 5  . Atrial fibrillation     Echo 5/11:  EF 65%, NL diast fxn  . Arthritis     wrist, hands - tx with otc meds  . Postmenopausal bleeding 12/07/2012  . MI, old 19  . Personal history of colonic adenoma 11/11/2004   Past Surgical History  Procedure Laterality Date  . Tubal ligation  1974  . Dilation and curettage of uterus  2013    hysteroscopy  . Joint replacement Left 2010    left hip   . Hysteroscopy w/d&c  12/07/2012    Procedure: DILATATION AND CURETTAGE /HYSTEROSCOPY;  Surgeon: Elveria Royals, MD;  Location: [facility];  Service: Gynecology;  Laterality: N/A;  Dilitation and curettage /hysteroscopy/biopsey of  lower segment of uterus   Family History  Problem Relation Age of Onset  . Diabetes Mother   . Heart disease Mother   . Colon cancer Mother 73  . Heart disease Father   . Cancer Brother     intestinal  . Cancer Brother     throat ca  . Cancer Brother     bladder  . Cancer Sister     Lung   Social History  Substance Use Topics  . Smoking status: Former Smoker -- 1.50 packs/day for 15 years    Types: Cigarettes    Quit date: 11/09/1981  . Smokeless tobacco: Never Used  . Alcohol Use: 1.8 oz/week    3 Cans of beer per week     Comment: weekly   OB History    No data available     Review of Systems  All other systems reviewed and are negative.     Allergies  Penicillins and Percocet  Home Medications   Prior to Admission medications   Medication Sig Start Date End Date Taking? Authorizing Provider  atorvastatin (LIPITOR) 80 MG tablet Take 1 tablet (80 mg total) by mouth daily. 06/27/15   Brittainy M Simmons, PA-C  CARTIA XT 300 MG 24 hr capsule TAKE 1 CAPSULE(300 MG) BY MOUTH DAILY 09/02/15   Minus Breeding, MD  cyclobenzaprine (FLEXERIL) 10 MG tablet Take 0.5-1 tablets (5-10 mg  total) by mouth 3 (three) times daily as needed for muscle spasms. 03/20/15   Olam Idler, MD  fluticasone (FLONASE) 50 MCG/ACT nasal spray Place 2 sprays into both nostrils daily. 11/08/14   Rosemarie Ax, MD  levothyroxine (SYNTHROID, LEVOTHROID) 150 MCG tablet TAKE 1 TABLET BY MOUTH DAILY 05/03/15   Rosemarie Ax, MD  traMADol (ULTRAM) 50 MG tablet Take 1-2 tablets (50-100 mg total) by mouth every 6 (six) hours as needed. 03/20/15   Olam Idler, MD  warfarin (COUMADIN) 5 MG tablet Take 5 mg by mouth daily.    Historical Provider, MD   BP 133/75 mmHg  Pulse 95  Temp(Src) 98.5 F (36.9 C) (Oral)  Resp 19  SpO2 94% Physical Exam  Constitutional: She appears well-developed and well-nourished. No distress.  HENT:  Head: Normocephalic and atraumatic.  Neck: Neck supple.   Cardiovascular: Normal rate.  An irregular rhythm present.  Pulmonary/Chest: Effort normal and breath sounds normal. No respiratory distress. She has no wheezes. She has no rales.  Abdominal: Soft. She exhibits no distension. There is no tenderness. There is no rebound and no guarding.  Neurological: She is alert.  Skin: She is not diaphoretic.  Nursing note and vitals reviewed.   ED Course  Procedures (including critical care time) Labs Review Labs Reviewed  BASIC METABOLIC PANEL - Abnormal; Notable for the following:    Glucose, Bld 109 (*)    GFR calc non Af Amer 59 (*)    All other components within normal limits  URINALYSIS, ROUTINE W REFLEX MICROSCOPIC (NOT AT [facility]) - Abnormal; Notable for the following:    Hgb urine dipstick SMALL (*)    Leukocytes, UA MODERATE (*)    All other components within normal limits  CBC WITH DIFFERENTIAL/PLATELET - Abnormal; Notable for the following:    WBC 11.8 (*)    Neutro Abs 8.4 (*)    Monocytes Absolute 1.1 (*)    All other components within normal limits  PROTIME-INR - Abnormal; Notable for the following:    Prothrombin Time 20.5 (*)    INR 1.77 (*)    All other components within normal limits  URINE MICROSCOPIC-ADD ON - Abnormal; Notable for the following:    Squamous Epithelial / LPF FEW (*)    Bacteria, UA FEW (*)    Casts HYALINE CASTS (*)    All other components within normal limits  CBG MONITORING, ED  I-STAT TROPOININ, ED    Imaging Review Dg Chest 2 View  09/11/2015   CLINICAL DATA:  Cough/wheezing and congestion for 1 week after taking a pna shot last week  EXAM: CHEST  2 VIEW  COMPARISON:  10/29/2014  FINDINGS: Stable mild cardiac enlargement and vascular congestion. Mild diffuse interstitial prominence stable. No evidence of pulmonary edema consolidation or effusion. Stable mild chronic bronchitic change.  IMPRESSION: Stable cardiac enlargement with vascular congestion. Stable bronchitic change. No acute abnormalities.    Electronically Signed   By: Skipper Cliche M.D.   On: 09/11/2015 12:10   I have personally reviewed and evaluated these images and lab results as part of my medical decision-making.   EKG Interpretation   Date/Time:  Thursday September 11 2015 10:50:46 EDT Ventricular Rate:  91 PR Interval:    QRS Duration: 88 QT Interval:  513 QTC Calculation: 631 R Axis:   113 Text Interpretation:  Atrial fibrillation Right axis deviation Nonspecific  T abnormalities, lateral leads Prolonged QT interval similar to prior  Confirmed by Mingo Amber  MD,  BLAIR ([id number]) on 09/11/2015 11:11:33 AM       11:14 AM Dr Mingo Amber made aware of the patient.    1:21 PM Discussed results with patient, advised admission to hospital.  Pt agrees with plan.    1:35 PM Spoke with Family Practice resident who agrees to admission.    MDM   Final diagnoses:  Near syncope  Chest pain, unspecified chest pain type    Afebrile nontoxic patient with CAD, afib on coumadin p/w near syncope, episode of chest pain while in ED.  Has been feeling lightheaded and with URI symptoms this week and today nearly had syncopal episode.  Developed CP in ED.  Workup remarkable of mild leukocytosis, borderline appearing UA (sent for culture, discussed +/- abx with admitting Dr, will hold for culture as pt not having urinary symptoms).  Also remarkable for atrial fibrillation and arrhythmia on EKG.  EKG reviewed with Dr Mingo Amber.  Admitted to patient's primary care provider, [place] for overnight observation.      Clayton Bibles, PA-C 09/11/15 1502  Evelina Bucy, MD 09/11/15 [phone]

## 2015-09-11 NOTE — Progress Notes (Signed)
ANTICOAGULATION CONSULT NOTE - Initial Consult  Pharmacy Consult for warfarin Indication: atrial fibrillation  Allergies  Allergen Reactions  . Penicillins Itching  . Percocet [Oxycodone-Acetaminophen] Itching    Patient Measurements:   91.5 kg  Vital Signs: Temp: 98.5 F (36.9 C) (09/15 1053) Temp Source: Oral (09/15 1053) BP: 136/84 mmHg (09/15 1530) Pulse Rate: 72 (09/15 1530)  Labs:  Recent Labs  09/11/15 1130  HGB 12.7  HCT 37.9  PLT 262  LABPROT 20.5*  INR 1.77*  CREATININE 0.98    Estimated Creatinine Clearance: 60.6 mL/min (by C-G formula based on Cr of 0.98).   Medical History: Past Medical History  Diagnosis Date  . Hypertension   . CAD (coronary artery disease)     a. reported hx of PCI in 2000 (?records);  b.  Myoview 5/10:  EF 71%, no ischemia  . HLD (hyperlipidemia)   . Hypothyroidism   . SVD (spontaneous vaginal delivery)     x 5  . Atrial fibrillation     Echo 5/11:  EF 65%, NL diast fxn  . Arthritis     wrist, hands - tx with otc meds  . Postmenopausal bleeding 12/07/2012  . MI, old 78  . Personal history of colonic adenoma 11/11/2004    Medications:  Prescriptions prior to admission  Medication Sig Dispense Refill Last Dose  . atorvastatin (LIPITOR) 80 MG tablet Take 1 tablet (80 mg total) by mouth daily. 30 tablet 6 09/10/2015 at Unknown time  . CARTIA XT 300 MG 24 hr capsule TAKE 1 CAPSULE(300 MG) BY MOUTH DAILY 30 capsule 5 09/10/2015 at Unknown time  . fluticasone (FLONASE) 50 MCG/ACT nasal spray Place 2 sprays into both nostrils daily. 16 g 6 Past Week at Unknown time  . levothyroxine (SYNTHROID, LEVOTHROID) 150 MCG tablet TAKE 1 TABLET BY MOUTH DAILY 90 tablet 1 09/11/2015 at Unknown time  . warfarin (COUMADIN) 5 MG tablet Take 5 mg by mouth daily. Take 1 tablet ('5mg'$ ) by mouth on Sun and Thurs, and 1 1/2 tablet (7.'5mg'$ ) all other days   09/10/2015 at Unknown time  . cyclobenzaprine (FLEXERIL) 10 MG tablet Take 0.5-1 tablets (5-10 mg  total) by mouth 3 (three) times daily as needed for muscle spasms. (Patient not taking: Reported on 09/11/2015) 30 tablet 0 Taking  . traMADol (ULTRAM) 50 MG tablet Take 1-2 tablets (50-100 mg total) by mouth every 6 (six) hours as needed. (Patient not taking: Reported on 09/11/2015) 20 tablet 0 Taking    Assessment: 64 y/o female who presents to the ED with near syncope, lightheadedness, and dizziness. She takes chronic warfarin for Afib. Pharmacy consulted to manage warfarin inpatient. INR is subtherapeutic at 1.77. No bleeding noted, CBC is normal.  PTA: 7.5 mg daily except 5 mg on Thurs and Sun, last dose at home was 9/14 (verified with patient)  Goal of Therapy:  INR 2-3 Monitor platelets by anticoagulation protocol: Yes   Plan:  - Warfarin 10 mg PO tonight - INR daily - Monitor for s/sx of bleeding  [facility], [place].D., BCPS Clinical Pharmacist Pager: [phone] 09/11/2015 4:08 PM

## 2015-09-11 NOTE — ED Notes (Signed)
Pt reports lightheaded and dizziness that began last Wednesday after seeing PCP for pneumonia shot. Pt reports nearly passing out this AM. Denies recent fever. Reports some cough and congestion. PT awake, alert, oriented x4, Vss.

## 2015-09-11 NOTE — H&P (Signed)
[facility] Admission History and Physical Service Pager: [phone]  Patient name: [name] Medical record number: [id number] Date of birth: 12-Aug-1951 Age: 64 y.o. Gender: female  Primary Care Provider: Clearance Coots, MD Consultants: Cardiology Code Status: FULL  Chief Complaint:  Near syncope, lightheadedness  Assessment and Plan: [name] is a 64 y.o. female presenting with lightheadedness, near syncope and sensation of heart racing . PMH is significant for AFib, CAD, long QT, HLD, HTN, hypothyroidism.  Near Syncope: Likely caused by decreased perfusion to her brain in the setting of Afib. She had palpitations preceding the sudden onset of symptoms. EKG showing Afib, nonspecific T wave abnormalities in the lateral leads, and QT prolongation. Another possibility is vasovagal syncope, given that she was outside in the heat when the near syncopal event occurred. She could also have orthostatic hypotension, although this is less likely because her dizziness did not occur when she was going from sitting to standing. Another differential is TIA, however she does not have any focal neurological deficits on exam and her blurred vision and pressure behind her L eye are not typical symptoms seen in TIA. - Admit to telemetry - Orthostatic vitals x1 to r/o orthostatic hypotension - am CBC and CMP - Will continue to monitor  Chest Pain: Atypical chest pain given nature of its moving down her side and similarity to usual gas type pain.  Likely GI in origin, given the fact that she describes it as "indigestion" and has had this pain before with flatulence. ACS less likely as troponins negative x 1. EKG does show nonspecific T wave abnormalities in the lateral leads. Heart Score 3 for age, and h/o HLD, HTN - Will get one more troponin this evening at 19:00, continue to trend if elevated - Repeat EKG in the am  Atrial fibrillation: Currently in Afib with RVR,  hemodynamically stable, no echocardiogram in our system. Diltiazem was increased in 01/2015 by her cardiologist (Dr. Minus Breeding).  - Will consult cardiology to see if they want to increase her Diltiazem dose while she is in the hospital - Echocardiogram - Coumadin per pharmacy - Continue home Diltiazem '300mg'$  qd - Cardiac monitoring  HTN: Stable, BP 128/82 in the ED - Continue home diltiazem  HLD: Continue home Lipitor '80mg'$  qD  Hypothyroidism: Stable, last TSH 03/2015 was 1.7 - Continue home Synthroid  FEN/GI: Heart healthy diet, MIVFs NS at 153m/hr Prophylaxis: On Coumadin for Afib  Disposition: Admit to [facility] attending Dr. WMingo Amber  History of Present Illness:  Sara LEMLERis a 64y.o. female presenting with near syncope and atrial fibrillation. This morning, she felt like her heart was racing and pounding in her chest. This lasted for a few minutes and then went away on its own. An hour later, she was outside at a carwash when she suddenly felt very dizzy. She was bending down to put a soda bottle into a trash can. She felt like everything was spinning around her and she felt like she was going to fall. She was able to get into her car and sit down. She then noticed that her left eye was blurry and watery and she felt a "pressure" behind her left eye. This lasted about 10 minutes and then resolved on its own. She went home and took her heart rate and blood pressure. HR was 100, BP was 122/69. She last had a dizzy spell like this in November 2015. She went to the ED  for this and was hospitalized overnight. When she was in the ED this time, she started having chest pain in her lower chest. The pain started centrally and then moved to her L lower chest. It felt like "indigestion" and "gas pain". She has felt this pain in the past before she has flatulence.The pain lasted for 15 minutes and resolved on its own. She is not having any current chest pain.  She has  been having congestion, headache, and feeling sick overall since getting a pneumonia vaccine at her PCP last week. Her granddaughter and grandson, who she lives with, have had similar symptoms. She denies any fever, chills, rhinorrhea, sore throat, nausea, vomiting, diarrhea, constipation, or swelling of her feet. She has not started taking any new medications and has not had any changes to the doses of her current medications.  In Feb 2016, her Cardizem was increased from '240mg'$  qd to '300mg'$  qd. She can sometimes feel when she is in Afib but she was in Afib during this visit and had no idea.  In the ED, she was in Afib with HRs in the 90s-110s. Her BMET was unremarkable, CBC significant for WBC 11.8, PT 20.5, INR 1.77, UA was significant for few bacteria, moderate leukocytes, negative nitrites, 7-10 WBC. Urine culture was sent. CXR showed stable cardiac enlargement with vascular congestion, no acute abnormalities. EKG showed Afib with HR of 91, nonspecific T wave abnormalities in the lateral leads. She was admitted for observation and ACS rule out.  Review Of Systems: Per HPI with the following additions: None. Otherwise 12 point review of systems was performed and was unremarkable.  Patient Active Problem List   Diagnosis Date Noted  . Near syncope 09/11/2015  . Atrial fibrillation with RVR 09/11/2015  . Abnormal vaginal bleeding 05/06/2015  . Hematuria 05/06/2015  . Disease of middle ear or mastoid 04/10/2015  . Long-term (current) use of anticoagulants 02/19/2015  . Long QT interval 11/08/2014  . Bradycardia, sinus   . Family history of colon cancer - mother in 43's 06/04/2014  . Prediabetes 04/11/2014  . Atrial fibrillation 12/18/2013  . Depression, major 04/19/2012  . Colon polyps 05/06/2011  . Postmenopausal bleeding 05/06/2011  . DEGENERATIVE JOINT DISEASE, LEFT HIP 07/20/2010  . HLD (hyperlipidemia) 04/29/2009  . OBESITY, UNSPECIFIED 04/29/2009  . Essential hypertension, benign  04/29/2009  . Coronary atherosclerosis 04/29/2009  . Hypothyroidism 02/23/2007  . MYOCARDIAL INFARCTION, OLD 02/23/2007  . Personal history of colonic adenoma 11/11/2004   Past Medical History: Past Medical History  Diagnosis Date  . Hypertension   . CAD (coronary artery disease)     a. reported hx of PCI in 2000 (?records);  b.  Myoview 5/10:  EF 71%, no ischemia  . HLD (hyperlipidemia)   . Hypothyroidism   . SVD (spontaneous vaginal delivery)     x 5  . Atrial fibrillation     Echo 5/11:  EF 65%, NL diast fxn  . Arthritis     wrist, hands - tx with otc meds  . Postmenopausal bleeding 12/07/2012  . MI, old 63  . Personal history of colonic adenoma 11/11/2004   Past Surgical History: Past Surgical History  Procedure Laterality Date  . Tubal ligation  1974  . Dilation and curettage of uterus  2013    hysteroscopy  . Joint replacement Left 2010    left hip   . Hysteroscopy w/d&c  12/07/2012    Procedure: DILATATION AND CURETTAGE /HYSTEROSCOPY;  Surgeon: Elveria Royals, MD;  Location: [facility]  ORS;  Service: Gynecology;  Laterality: N/A;  Dilitation and curettage /hysteroscopy/biopsey of lower segment of uterus   Social History: Social History  Substance Use Topics  . Smoking status: Former Smoker -- 1.50 packs/day for 15 years    Types: Cigarettes    Quit date: 11/09/1981  . Smokeless tobacco: Never Used  . Alcohol Use: 1.8 oz/week    3 Cans of beer per week     Comment: weekly   Additional social history: Heavy alcohol use in the past, used to drink up to 20 beers per day on the weekend, quit 5-6 years ago. No drug use. Lives at home with husband, son, and two granddaughters.  Please also refer to relevant sections of EMR.  Family History: Family History  Problem Relation Age of Onset  . Diabetes Mother   . Heart disease Mother   . Colon cancer Mother 58  . Heart disease Father   . Cancer Brother     intestinal  . Cancer Brother     throat ca  . Cancer Brother      bladder  . Cancer Sister     Lung  Brother- stroke and A-fib Nephew- recently died from heart problems, age 17  Allergies and Medications: Allergies  Allergen Reactions  . Penicillins Itching  . Percocet [Oxycodone-Acetaminophen] Itching   No current facility-administered medications on file prior to encounter.   Current Outpatient Prescriptions on File Prior to Encounter  Medication Sig Dispense Refill  . atorvastatin (LIPITOR) 80 MG tablet Take 1 tablet (80 mg total) by mouth daily. 30 tablet 6  . CARTIA XT 300 MG 24 hr capsule TAKE 1 CAPSULE(300 MG) BY MOUTH DAILY 30 capsule 5  . fluticasone (FLONASE) 50 MCG/ACT nasal spray Place 2 sprays into both nostrils daily. 16 g 6  . levothyroxine (SYNTHROID, LEVOTHROID) 150 MCG tablet TAKE 1 TABLET BY MOUTH DAILY 90 tablet 1  . warfarin (COUMADIN) 5 MG tablet Take 5 mg by mouth daily. Take 1 tablet ('5mg'$ ) by mouth on Sun and Thurs, and 1 1/2 tablet (7.'5mg'$ ) all other days    . cyclobenzaprine (FLEXERIL) 10 MG tablet Take 0.5-1 tablets (5-10 mg total) by mouth 3 (three) times daily as needed for muscle spasms. (Patient not taking: Reported on 09/11/2015) 30 tablet 0  . traMADol (ULTRAM) 50 MG tablet Take 1-2 tablets (50-100 mg total) by mouth every 6 (six) hours as needed. (Patient not taking: Reported on 09/11/2015) 20 tablet 0  . [DISCONTINUED] diltiazem (DILACOR XR) 120 MG 24 hr capsule Take 1 capsule (120 mg total) by mouth daily. 30 capsule 6    Objective: BP 136/84 mmHg  Pulse 72  Temp(Src) 98.5 F (36.9 C) (Oral)  Resp 11  SpO2 90% Exam: General: Well-appearing woman, lying in bed, in NAD HEENT: [place]/AT, EOMI, PERRLA, MMM Neck: Supple, no lymphadenopathy Cardiovascular: Irregularly irregular rhythm, tachycardic, no murmurs, no carotid bruits Respiratory: Normal work of breathing, bibasilar crackles present, no wheezes Abdomen: obese, +BS, soft, non-tender, non-distended MSK:  Normal strength, 1+ pitting edema in lower  extremities bilaterally Skin: No rashes or lesions Neuro: Alert and oriented, CN 2-12 grossly intact Psych: Mood and affect are normal.  Labs and Imaging: CBC BMET   Recent Labs Lab 09/11/15 1130  WBC 11.8*  HGB 12.7  HCT 37.9  PLT 262    Recent Labs Lab 09/11/15 1130  NA 139  K 4.1  CL 108  CO2 23  BUN 13  CREATININE 0.98  GLUCOSE 109*  CALCIUM 9.1  CXR: IMPRESSION: Stable cardiac enlargement with vascular congestion. Stable bronchitic change. No acute abnormalities.   Sela Hua, MD 09/11/2015, 4:12 PM PGY-1, [place] Intern pager: [phone], text pages welcome   I have seen and examined the patient. I have read and agree with the above note. My changes are noted in blue.  Destenie Ingber A. Lincoln Brigham MD, [facility] Resident PGY-2 Pager [phone]

## 2015-09-11 NOTE — Consult Note (Signed)
Patient ID: [name] MRN: [id number] DOB/AGE: 01-19-1951 64 y.o.  Admit date: 09/11/2015 Referring Physician: Mingo Amber Primary Cardiologist: Percival Spanish Reason for Consultation: Atrial fibrillation, near syncope, chest pain  HPI: 64 yo female with history of HTN, CAD, HLD, hypothyroidism, persistent atrial fibrillation presenting with dizziness, heart racing. She is known to have PAF. She is followed by Dr. Percival Spanish. She has been anti-coagulated with coumadin. (INR 1.77 today). She has had no exertional chest pain at home. One episode of chest pain this am that lasted for 15 minutes and resolved with belching. She was outside at the [place] with her son and felt dizzy today. She did not pass out. She just felt lightheaded. She had noticed her heart racing this am. She feels back to baseline now without any complaints. Troponin negative.    Past Medical History  Diagnosis Date  . Hypertension   . CAD (coronary artery disease)     a. reported hx of PCI in 2000 (?records);  b.  Myoview 5/10:  EF 71%, no ischemia  . HLD (hyperlipidemia)   . Hypothyroidism   . SVD (spontaneous vaginal delivery)     x 5  . Atrial fibrillation     Echo 5/11:  EF 65%, NL diast fxn  . Arthritis     wrist, hands - tx with otc meds  . Postmenopausal bleeding 12/07/2012  . MI, old 62  . Personal history of colonic adenoma 11/11/2004    Family History  Problem Relation Age of Onset  . Diabetes Mother   . Heart disease Mother   . Colon cancer Mother 1  . Heart disease Father   . Cancer Brother     intestinal  . Cancer Brother     throat ca  . Cancer Brother     bladder  . Cancer Sister     Lung    Social History   Social History  . Marital Status: Married    Spouse Name: Eddie Dibbles  . Number of Children: 5  . Years of Education: 12   Occupational History  . works at a Fore[employer]    Social History Main Topics  . Smoking status: Former Smoker -- 1.50 packs/day for 15 years    Types: Cigarettes     Quit date: 11/09/1981  . Smokeless tobacco: Never Used  . Alcohol Use: 1.8 oz/week    3 Cans of beer per week     Comment: weekly  . Drug Use: No  . Sexual Activity: Yes    Birth Control/ Protection: Surgical   Other Topics Concern  . 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  Has 5 adult children all living in the area.      Health Care POA:    Emergency Contact: husband, Donzella Carrol, [phone]   End of Life Plan: discussed AD, gave pt pamphlet   Who lives with you: husband, two sons, granddaughter, and great-grandson.    Any pets: dalamation- Kisses   Diet: Pt has a varied diet of protein, starch and vegetables.   Exercise: Pt has no regular exercise routine.  Does wear fitbit and tracks steps, reports walking 12-13k steps a day when she is working.   Seatbelts: Pt reports wearing seatbelt when in vehicles.    Hobbies: cooking          Past Surgical History  Procedure Laterality Date  . Tubal ligation  1974  .  Dilation and curettage of uterus  2013    hysteroscopy  . Joint replacement Left 2010    left hip   . Hysteroscopy w/d&c  12/07/2012    Procedure: DILATATION AND CURETTAGE /HYSTEROSCOPY;  Surgeon: Elveria Royals, MD;  Location: [facility];  Service: Gynecology;  Laterality: N/A;  Dilitation and curettage /hysteroscopy/biopsey of lower segment of uterus    Allergies  Allergen Reactions  . Penicillins Itching  . Percocet [Oxycodone-Acetaminophen] Itching   Hospital Medications:  Scheduled Meds: . sodium chloride   Intravenous STAT  . atorvastatin  80 mg Oral Daily  . diltiazem  300 mg Oral Daily  . [START ON 09/12/2015] levothyroxine  150 mcg Oral QAC breakfast  . sodium chloride  3 mL Intravenous Q12H   Review of systems complete and found to be negative unless listed above   Physical Exam: Blood pressure 128/82, pulse 79, temperature 97.9 F (36.6 C), temperature source Oral, resp. rate 14, height '5\' 3"'$  (1.6  m), weight 201 lb 11.2 oz (91.491 kg), SpO2 94 %.    General: Well developed, well nourished, NAD  HEENT: OP clear, mucus membranes moist  SKIN: warm, dry. No rashes.  Neuro: No focal deficits  Musculoskeletal: Muscle strength 5/5 all ext  Psychiatric: Mood and affect normal  Neck: No JVD, no carotid bruits, no thyromegaly, no lymphadenopathy.  Lungs:Clear bilaterally, no wheezes, rhonci, crackles  Cardiovascular: Irregular irregular No murmurs, gallops or rubs.  Abdomen:Soft. Bowel sounds present. Non-tender.  Extremities: No lower extremity edema. Pulses are 2 + in the bilateral DP/PT.   Labs:   Lab Results  Component Value Date   WBC 11.8* 09/11/2015   HGB 12.7 09/11/2015   HCT 37.9 09/11/2015   MCV 88.1 09/11/2015   PLT 262 09/11/2015     Recent Labs Lab 09/11/15 1130  NA 139  K 4.1  CL 108  CO2 23  BUN 13  CREATININE 0.98  CALCIUM 9.1  GLUCOSE 109*   Lab Results  Component Value Date   CKTOTAL 101 08/28/2011   CKMB 3.4 08/28/2011   TROPONINI <0.30 10/30/2014     EKG: Atrial fibrillation, rate 93 bpm. Non-specific T wave abnormalities.   ASSESSMENT AND PLAN:   1. Atrial fibrillation, persistent: She has chronic persistent atrial fib. Rate is controlled on Cardizem. She is anti-coagulated on coumadin. Would not make any changes tonight.   2. Chest pain: atypical. Troponin negative. I do not suspect ACS. Continue to cycle troponin.    Signed: Lauree Chandler, MD 09/11/2015, 5:44 PM

## 2015-09-12 ENCOUNTER — Encounter (HOSPITAL_COMMUNITY): Payer: Self-pay | Admitting: Physician Assistant

## 2015-09-12 ENCOUNTER — Other Ambulatory Visit: Payer: Self-pay | Admitting: Internal Medicine

## 2015-09-12 ENCOUNTER — Inpatient Hospital Stay (HOSPITAL_BASED_OUTPATIENT_CLINIC_OR_DEPARTMENT_OTHER): Payer: Medicare Other

## 2015-09-12 DIAGNOSIS — I10 Essential (primary) hypertension: Secondary | ICD-10-CM | POA: Diagnosis not present

## 2015-09-12 DIAGNOSIS — R55 Syncope and collapse: Secondary | ICD-10-CM | POA: Diagnosis not present

## 2015-09-12 DIAGNOSIS — I4891 Unspecified atrial fibrillation: Secondary | ICD-10-CM

## 2015-09-12 DIAGNOSIS — R079 Chest pain, unspecified: Secondary | ICD-10-CM

## 2015-09-12 DIAGNOSIS — I2510 Atherosclerotic heart disease of native coronary artery without angina pectoris: Secondary | ICD-10-CM | POA: Diagnosis not present

## 2015-09-12 LAB — COMPREHENSIVE METABOLIC PANEL
ALT: 18 U/L (ref 14–54)
AST: 19 U/L (ref 15–41)
Albumin: 2.9 g/dL — ABNORMAL LOW (ref 3.5–5.0)
Alkaline Phosphatase: 65 U/L (ref 38–126)
Anion gap: 7 (ref 5–15)
BUN: 11 mg/dL (ref 6–20)
CO2: 25 mmol/L (ref 22–32)
Calcium: 8.7 mg/dL — ABNORMAL LOW (ref 8.9–10.3)
Chloride: 107 mmol/L (ref 101–111)
Creatinine, Ser: 0.97 mg/dL (ref 0.44–1.00)
GFR calc Af Amer: >60 mL/min (ref >60)
GFR calc non Af Amer: 60 mL/min — ABNORMAL LOW (ref >60)
Glucose, Bld: 134 mg/dL — ABNORMAL HIGH (ref 65–99)
Potassium: 4.1 mmol/L (ref 3.5–5.1)
Sodium: 139 mmol/L (ref 135–145)
Total Bilirubin: 0.7 mg/dL (ref 0.3–1.2)
Total Protein: 6.5 g/dL (ref 6.5–8.1)

## 2015-09-12 LAB — CBC
HCT: 36.9 % (ref 36.0–46.0)
Hemoglobin: 12.2 g/dL (ref 12.0–15.0)
MCH: 29.5 pg (ref 26.0–34.0)
MCHC: 33.1 g/dL (ref 30.0–36.0)
MCV: 89.1 fL (ref 78.0–100.0)
Platelets: 247 10*3/uL (ref 150–400)
RBC: 4.14 MIL/uL (ref 3.87–5.11)
RDW: 13.5 % (ref 11.5–15.5)
WBC: 9.8 10*3/uL (ref 4.0–10.5)

## 2015-09-12 LAB — TROPONIN I: Troponin I: <0.03 ng/mL (ref <0.031)

## 2015-09-12 LAB — PROTIME-INR
INR: 1.86 — ABNORMAL HIGH (ref 0.00–1.49)
Prothrombin Time: 21.3 seconds — ABNORMAL HIGH (ref 11.6–15.2)

## 2015-09-12 MED ORDER — WARFARIN SODIUM 5 MG PO TABS: 7.5000 mg | ORAL_TABLET | Freq: Every evening | ORAL | Status: DC

## 2015-09-12 MED ORDER — ASPIRIN 81 MG PO CHEW: 81.0000 mg | CHEWABLE_TABLET | Freq: Every day | ORAL | Status: DC

## 2015-09-12 MED ORDER — NITROGLYCERIN 0.4 MG SL SUBL: 0.4000 mg | SUBLINGUAL_TABLET | SUBLINGUAL | Status: DC | PRN

## 2015-09-12 NOTE — Discharge Instructions (Signed)
1. Take 10 mg of Coumadin tonight. After tonight, take 7.5 mg of coumadin daily until your INR check on Monday at 11:50.  2. Cardiology recommended that you continue to take the Diltiazem '300mg'$  daily. They would like you to start taking a daily aspirin '81mg'$ . They are also recommending that you take Nitroglycerin as needed for chest pain until you can be seen in their clinic. We have sent prescriptions for the aspirin and nitroglycerin into your pharmacy.

## 2015-09-12 NOTE — Discharge Summary (Signed)
[name] Discharge Summary  Patient name: [name] Medical record number: [id number] Date of birth: 08/02/1951 Age: 64 y.o. Gender: female Date of Admission: 09/11/2015  Date of Discharge: 09/12/15 Admitting Physician: Sara Reasons, MD  Primary Care Provider: Clearance Coots, MD Consultants: Cardiology  Indication for Hospitalization: Near Syncope and Afib with RVR  Discharge Diagnoses/Problem List:  Atrial Fibrillation with RVR Near syncope Atypical chest pain HTN HLD Hypothyroidism  Disposition: Home  Discharge Condition: Stable, improved  Discharge Exam:  General: Well-appearing woman, lying in bed, in NAD, conversational HEENT: [place]/AT, EOMI, PERRLA, MMM Neck: Supple, no lymphadenopathy Cardiovascular: Irregularly irregular rhythm, normal rate, no murmurs Respiratory: Normal work of breathing, bibasilar crackles present, no wheezes Abdomen: +BS, soft, non-tender, non-distended MSK: Normal strength, 1+ pitting edema in lower extremities bilaterally Skin: No rashes or lesions Neuro: Alert and oriented, CN 2-12 grossly intact Psych: Mood and affect are normal.  Brief Adkins Course:  [name] is a 64 year old F with a PMH of Afib (on Coumadin), HTN, CAD, HLD, hypothyroidism, hx MI who presented to the ED with near syncope and palpitations. The palpitations started early that morning and resolved on its own. An hour later, she was at a carwash outside and became suddenly lightheaded. She never lost consciousness. In the ED, she was found to be in Afib. Her HR was in the 80s-90s but occasionally increased to ~110. Her CBC and BMET were unremarkable, PT 20.5, INR 1.77. UA showed few bacteria, moderate leukocytes, negative nitrites, and 7-10 WBC. EKG showed Afib, nonspecific T wave abnormalities in the lateral leads, and prolonged QT interval to 631. She was admitted for overnight observation. Adkins course is described by problem list  below.  1. Near Syncope: We thought her near syncopal episode was likely caused by decreased perfusion to her brain in the setting of Afib, combined with orthostasis from the heat. Orthostatic vitals were negative for orthostatic hypotension. We repeated another EKG ~24 hours after her initial EKG and she was found to be in Afib. No T wave abnormalities were seen on this repeat EKG. We also ordered an ECHO to rule out any structural abnormalities of the heart as a cause of syncope. ECHO showed an EF of 55%, mild LVH, and a PA peak pressure of 64mHg.  2. Atrial Fibrillation: During her hospitalization, she remained in Afib, but she was mostly rate controlled in the 80s-90s. She did have occasional Afib with RVR, with HRs into the 120s. We continued her home Diltiazem at '300mg'$  qd and Coumadin. We consulted Cardiology, who recommended continuing Diltiazem at '300mg'$  qd with close outpatient follow-up in their clinic. They also recommended starting ASA '81mg'$  and PRN nitroglycerin until she was seen for follow-up at the cardiology office.   3. Atypical Chest Pain: While in the ED, Pt endorsed some pain located in the central lower part of her chest that radiated down her left side. She stated that she normally gets this pain when she has gas. We thought this pain was likely GI in etiology. Troponins were negative x 2. Per cardiology recommendations, Pt may need an outpatient stress test given her significant cardiac history.  4. HTN/HLD/Hypothyroidism: These chronic medical conditions remained stable throughout hospitalization. No changes were made to any medications.  Issues for Follow Up:  1. Per ED, they put in an order for a urine culture, given her borderline UA (few bacteria, moderate leukocytes, negative nitrites, 7-10 WBC). However, on chart review, this has not been  done. Please repeat UA and urine culture if Pt is endorsing urinary symptoms. 2. We started ASA '81mg'$  and Nitroglycerin PRN per Cardiology  recommendations. Please make sure she is taking a daily aspirin.  Significant Procedures: None  Significant Labs and Imaging:   Recent Labs Lab 09/11/15 1130 09/12/15 0209  WBC 11.8* 9.8  HGB 12.7 12.2  HCT 37.9 36.9  PLT 262 247    Recent Labs Lab 09/11/15 1130 09/11/15 1835 09/12/15 0209  NA 139  --  139  K 4.1  --  4.1  CL 108  --  107  CO2 23  --  25  GLUCOSE 109*  --  134*  BUN 13  --  11  CREATININE 0.98  --  0.97  CALCIUM 9.1  --  8.7*  MG  --  2.0  --   ALKPHOS  --   --  65  AST  --   --  19  ALT  --   --  18  ALBUMIN  --   --  2.9*   ECHO (9/16): EF 55%, mild LVH, no regional wall motion abnormalities, PA peak pressure 32 mmHg  Results/Tests Pending at Time of Discharge: None  Discharge Medications:    Medication List    TAKE these medications        aspirin 81 MG chewable tablet  Chew 1 tablet (81 mg total) by mouth daily.     atorvastatin 80 MG tablet  Commonly known as:  LIPITOR  Take 1 tablet (80 mg total) by mouth daily.     CARTIA XT 300 MG 24 hr capsule  Generic drug:  diltiazem  TAKE 1 CAPSULE(300 MG) BY MOUTH DAILY     cyclobenzaprine 10 MG tablet  Commonly known as:  FLEXERIL  Take 0.5-1 tablets (5-10 mg total) by mouth 3 (three) times daily as needed for muscle spasms.     fluticasone 50 MCG/ACT nasal spray  Commonly known as:  FLONASE  Place 2 sprays into both nostrils daily.     levothyroxine 150 MCG tablet  Commonly known as:  SYNTHROID, LEVOTHROID  TAKE 1 TABLET BY MOUTH DAILY     nitroGLYCERIN 0.4 MG SL tablet  Commonly known as:  NITROSTAT  Place 1 tablet (0.4 mg total) under the tongue every 5 (five) minutes as needed for chest pain.     traMADol 50 MG tablet  Commonly known as:  ULTRAM  Take 1-2 tablets (50-100 mg total) by mouth every 6 (six) hours as needed.     warfarin 5 MG tablet  Commonly known as:  COUMADIN  Take 1.5 tablets (7.5 mg total) by mouth every evening.        Discharge Instructions: Please  refer to Patient Instructions section of EMR for full details.  Patient was counseled important signs and symptoms that should prompt return to medical care, changes in medications, dietary instructions, activity restrictions, and follow up appointments.   Follow-Up Appointments:     Follow-up Information    Follow up with [facility]. Go on 09/22/2015.   Specialty:  Family Medicine   Why:  For Adkins Followup at 8:30 am with Dr. Claudie Revering information:   [address] [id number] [place] [phone]      Follow up with Tarri Fuller, PA-C On 09/23/2015.   Specialties:  Physician Assistant, Radiology, Interventional Cardiology   Why:  @ 8:30am   Contact information:   [address] [place] [address] [phone]       Sela Hua, MD 09/12/2015, 6:08 PM PGY-1, [place]

## 2015-09-12 NOTE — Progress Notes (Signed)
Patient Name: [name] Date of Encounter: 09/12/2015     Active Problems:   Near syncope   Atrial fibrillation with RVR   Pain in the chest    SUBJECTIVE  Feeling well. No complaints. No further CP or dizziness.   CURRENT MEDS . atorvastatin  80 mg Oral Daily  . diltiazem  300 mg Oral Daily  . levothyroxine  150 mcg Oral QAC breakfast  . sodium chloride  3 mL Intravenous Q12H  . Warfarin - Pharmacist Dosing Inpatient   Does not apply q1800    OBJECTIVE  Filed Vitals:   09/11/15 1530 09/11/15 1614 09/11/15 2043 09/12/15 0454  BP: 136/84 128/82 116/66 103/57  Pulse: 72 79 124 92  Temp:  97.9 F (36.6 C) 98.3 F (36.8 C) 97.7 F (36.5 C)  TempSrc:  Oral Oral Oral  Resp: 11 14    Height:  '5\' 3"'$  (1.6 m)    Weight:  201 lb 11.2 oz (91.491 kg)  203 lb 11.2 oz (92.398 kg)  SpO2: 90% 94% 94% 97%    Intake/Output Summary (Last 24 hours) at 09/12/15 0935 Last data filed at 09/12/15 0659  Gross per 24 hour  Intake 1408.33 ml  Output      0 ml  Net 1408.33 ml   Filed Weights   09/11/15 1614 09/12/15 0454  Weight: 201 lb 11.2 oz (91.491 kg) 203 lb 11.2 oz (92.398 kg)    PHYSICAL EXAM  General: Pleasant, NAD. Neuro: Alert and oriented X 3. Moves all extremities spontaneously. Psych: Normal affect. HEENT:  Normal  Neck: Supple without bruits or JVD. Lungs:  Resp regular and unlabored, CTA. Heart: irreg irreg. no s3, s4, or murmurs. Abdomen: Soft, non-tender, non-distended, BS + x 4.  Extremities: No clubbing, cyanosis or edema. DP/PT/Radials 2+ and equal bilaterally.  Accessory Clinical Findings  CBC  Recent Labs  09/11/15 1130 09/12/15 0209  WBC 11.8* 9.8  NEUTROABS 8.4*  --   HGB 12.7 12.2  HCT 37.9 36.9  MCV 88.1 89.1  PLT 262 270   Basic Metabolic Panel  Recent Labs  09/11/15 1130 09/11/15 1835 09/12/15 0209  NA 139  --  139  K 4.1  --  4.1  CL 108  --  107  CO2 23  --  25  GLUCOSE 109*  --  134*  BUN 13  --  11  CREATININE  0.98  --  0.97  CALCIUM 9.1  --  8.7*  MG  --  2.0  --    Liver Function Tests  Recent Labs  09/12/15 0209  AST 19  ALT 18  ALKPHOS 65  BILITOT 0.7  PROT 6.5  ALBUMIN 2.9*   No results for input(s): LIPASE, AMYLASE in the last 72 hours. Cardiac Enzymes  Recent Labs  09/11/15 1835 09/12/15 0209  TROPONINI <0.03 <0.03    TELE  afib  With HR in 70-80s. Some RVR this AM.   Radiology/Studies  Dg Chest 2 View  09/11/2015   CLINICAL DATA:  Cough/wheezing and congestion for 1 week after taking a pna shot last week  EXAM: CHEST  2 VIEW  COMPARISON:  10/29/2014  FINDINGS: Stable mild cardiac enlargement and vascular congestion. Mild diffuse interstitial prominence stable. No evidence of pulmonary edema consolidation or effusion. Stable mild chronic bronchitic change.  IMPRESSION: Stable cardiac enlargement with vascular congestion. Stable bronchitic change. No acute abnormalities.   Electronically Signed   By: Skipper Cliche M.D.   On: 09/11/2015 12:10  ASSESSMENT AND PLAN 64 yo female with history of HTN, CAD s/p PTCA-RCA (1999), HLD, hypothyroidism, persistent atrial fibrillation presenting with dizziness, heart racing.   1. Atrial fibrillation, persistent: She has chronic persistent atrial fib. Last ECG in sinus was from 10/2015. -- Rate is controlled on Cardizem '300mg'$ . She is anti-coagulated on coumadin.  -- 2D ECHO (09/12/15) w/ EF 55%, no RWMA, mild systolic RV dysfunction. PA pressure 32.  -- INR subtheraputic (1.86). Coumadin per pharmacy.  -- Review of records reveals that she has a long hx of admissions and office visits for pre syncope with medication adjustments. Would continue current medications and set up for close outpatient follow up.   2. Chest pain: atypical. Now completely resolved.  -- Troponin negative x2. Low suspicion for ACS. Remote hx of PCTA (1999). Myoview 5/10:  EF 71%, no ischemia. If she continues to have chest pain we could set for outpatient  nuclear stress test.   3. 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 Mag checked and normal (2). k 4.1. On no QTc prolonging medications  Signed, Crista Luria  Pager [phone]

## 2015-09-12 NOTE — Progress Notes (Signed)
[facility] for Warfarin Indication: atrial fibrillation  Allergies  Allergen Reactions  . Penicillins Itching  . Percocet [Oxycodone-Acetaminophen] Itching    Patient Measurements: Height: '5\' 3"'$  (160 cm) Weight: 203 lb 11.2 oz (92.398 kg) IBW/kg (Calculated) : 52.4 91.5 kg  Vital Signs: Temp: 97.7 F (36.5 C) (09/16 0454) Temp Source: Oral (09/16 0454) BP: 103/57 mmHg (09/16 0454) Pulse Rate: 92 (09/16 0454)  Labs:  Recent Labs  09/11/15 1130 09/11/15 1835 09/12/15 0209  HGB 12.7  --  12.2  HCT 37.9  --  36.9  PLT 262  --  247  LABPROT 20.5*  --  21.3*  INR 1.77*  --  1.86*  CREATININE 0.98  --  0.97  TROPONINI  --  <0.03 <0.03    Estimated Creatinine Clearance: 61.6 mL/min (by C-G formula based on Cr of 0.97).  Assessment: 64 y/o female who presents to the ED with near syncope, lightheadedness, and dizziness. She takes chronic warfarin for Afib. Pharmacy consulted to manage warfarin inpatient. INR on admission  is subtherapeutic at 1.77. No bleeding noted, CBC is normal.  INR today = 1.86  PTA: 7.5 mg daily except 5 mg on Thurs and Sun, last dose at home was 9/14 (verified with patient)  Goal of Therapy:  INR 2-3 Monitor platelets by anticoagulation protocol: Yes   Plan:  - Repeat Warfarin 10 mg PO tonight - INR daily - Monitor for s/sx of bleeding  Thank you Anette Guarneri, PharmD [phone]  09/12/2015 10:54 AM

## 2015-09-12 NOTE — Progress Notes (Signed)
Utilization review completed. Bertha Stanfill, RN, BSN. 

## 2015-09-12 NOTE — Progress Notes (Signed)
Family Medicine Teaching Service Daily Progress Note Intern Pager: [phone]  Patient name: [name] Medical record number: [id number] Date of birth: 1951/10/08 Age: 64 y.o. Gender: female  Primary Care Provider: Clearance Coots, MD Consultants: Cardiology Code Status: FULL  Pt Overview and Major Events to Date:  None  Assessment and Plan: [name] is a 64 y.o. female presenting with lightheadedness, near syncope and sensation of heart racing . PMH is significant for AFib, CAD, long QT, HLD, HTN, hypothyroidism.  Near Syncope: Likely caused by decreased perfusion to her brain in the setting of Afib. She had palpitations preceding the sudden onset of symptoms. EKG showing Afib, nonspecific T wave abnormalities in the lateral leads, and QT prolongation.  - Orthostatic vitals were negative for orthostatic hypotension. - Will continue to monitor  Atypical Chest Pain: Likely GI in origin, given the fact that she describes it as "indigestion" and has had this pain before with flatulence. ACS less likely as troponins negative x 2. EKG does show nonspecific T wave abnormalities in the lateral leads. Repeat EKG this morning showed atrial fibrillation with a rate of 82. Heart score is 3.  - Will continue to monitor  Atrial fibrillation: In Afib overnight, mostly rate controlled in the 80s except for one HR to 124. Diltiazem was increased in 01/2015 by her cardiologist (Dr. Minus Breeding).  - Will follow-up cards recs today - Will follow-up ECHO today - Coumadin per pharmacy - Continue home Diltiazem '300mg'$  qd - Cardiac monitoring  Prolonged QTc, resolved: QTc 631 on EKG. - Magnesium was 2.0 - Repeat EKG this morning showed a QTc of 469 - On telemetry, will continue to monitor.  HTN: Stable, BPs ranging from 103-128/57-82. - Continue home diltiazem  HLD: -Continue home Lipitor '80mg'$  qD  Hypothyroidism: Stable, last TSH 03/2015 was 1.7 - Continue home Synthroid  FEN/GI: Heart  healthy diet, MIVFs: NS at 145m/hr PPx: On Coumadin for Afib  Disposition: Home likely today, pending cards recs  Subjective:  States she feels much better today. She is still having some palpitations with standing. She has not had any more chest pain.  Objective: Temp:  [97.7 F (36.5 C)-98.5 F (36.9 C)] 97.7 F (36.5 C) (09/16 0454) Pulse Rate:  [50-124] 92 (09/16 0454) Resp:  [11-26] 14 (09/15 1614) BP: (103-142)/(57-92) 103/57 mmHg (09/16 0454) SpO2:  [90 %-97 %] 97 % (09/16 0454) Weight:  [201 lb 11.2 oz (91.491 kg)-203 lb 11.2 oz (92.398 kg)] 203 lb 11.2 oz (92.398 kg) (09/16 0454) Physical Exam: General: Well-appearing woman, lying in bed, in NAD, conversational HEENT: [place]/AT, EOMI, PERRLA, MMM Neck: Supple, no lymphadenopathy Cardiovascular: Irregularly irregular rhythm, normal rate, no murmurs Respiratory: Normal work of breathing, bibasilar crackles present, no wheezes Abdomen: +BS, soft, non-tender, non-distended MSK: Normal strength, 1+ pitting edema in lower extremities bilaterally Skin: No rashes or lesions Neuro: Alert and oriented, CN 2-12 grossly intact Psych: Mood and affect are normal.  Laboratory:  Recent Labs Lab 09/11/15 1130 09/12/15 0209  WBC 11.8* 9.8  HGB 12.7 12.2  HCT 37.9 36.9  PLT 262 247    Recent Labs Lab 09/11/15 1130 09/12/15 0209  NA 139 139  K 4.1 4.1  CL 108 107  CO2 23 25  BUN 13 11  CREATININE 0.98 0.97  CALCIUM 9.1 8.7*  PROT  --  6.5  BILITOT  --  0.7  ALKPHOS  --  65  ALT  --  18  AST  --  19  GLUCOSE 109*  134*    Imaging/Diagnostic Tests: CXR 9/15: stable cardiac enlargement with vascular congestion, no acute abnormalities.  Sela Hua, MD 09/12/2015, 7:31 AM PGY-1, [facility] Intern pager: [phone], text pages welcome

## 2015-09-15 ENCOUNTER — Ambulatory Visit (INDEPENDENT_AMBULATORY_CARE_PROVIDER_SITE_OTHER): Payer: Medicare Other | Admitting: Pharmacist Clinician (PhC)/ Clinical Pharmacy Specialist

## 2015-09-15 DIAGNOSIS — Z7901 Long term (current) use of anticoagulants: Secondary | ICD-10-CM | POA: Diagnosis not present

## 2015-09-15 DIAGNOSIS — I480 Paroxysmal atrial fibrillation: Secondary | ICD-10-CM | POA: Diagnosis not present

## 2015-09-15 LAB — POCT INR: INR: 2

## 2015-09-22 ENCOUNTER — Ambulatory Visit (INDEPENDENT_AMBULATORY_CARE_PROVIDER_SITE_OTHER): Payer: Medicare Other | Admitting: Internal Medicine

## 2015-09-22 ENCOUNTER — Encounter: Payer: Self-pay | Admitting: Internal Medicine

## 2015-09-22 VITALS — BP 125/78 | HR 76 | Temp 98.3°F | Ht 63.0 in | Wt 201.5 lb

## 2015-09-22 DIAGNOSIS — J0120 Acute ethmoidal sinusitis, unspecified: Secondary | ICD-10-CM

## 2015-09-22 DIAGNOSIS — R55 Syncope and collapse: Secondary | ICD-10-CM | POA: Diagnosis not present

## 2015-09-22 DIAGNOSIS — J322 Chronic ethmoidal sinusitis: Secondary | ICD-10-CM | POA: Insufficient documentation

## 2015-09-22 MED ORDER — CEFDINIR 300 MG PO CAPS: 300.0000 mg | ORAL_CAPSULE | Freq: Two times a day (BID) | ORAL | Status: DC

## 2015-09-22 NOTE — Progress Notes (Signed)
Subjective: [name] is a 64 y.o. female with known afib on coumadin is here for hospital follow-up of near syncope. However, she presents with complaint of congestion.   F/u of near syncope: -Was hospitalized and observed overnight on 09/11/15 after a near syncopal event.  -Was occasionally in RVR. She was continued on home diltiazem 300 mg qd and coumadin. ASA 81 mg and PRN nitrogylcerin were added until she has cardiolgy follow-up. -Had hyaline casts on UA during hospitalization. Suspect dehydration contributed to near syncope. She states she has been drinking about two 12 oz bottles of water daily since then. -Has appointment with cardiologist Dr. Percival Spanish tomorrow, 09/22/15.  -Since hospitalization has had no trouble taking her medications. -Has had a coumadin check since discharge with INR of 2. -Did lose balance about 4 days after leaving hospital but caught herself on a doorframe and did not fall. She attributes this to feeling off-balance due to congestion.  Congestion: -3 weeks of congestion, began after receiving pneumonia and flu vaccines per patient -Associated with dizziness, lightheadedness -In addition, patient can't hear well due to congestion -Has an occasional cough, "not bad"; coughing up a little bit of white phlegm  -No fevers at home -Pressure in forehead and above left eye -Son had bronchitis recently (Friday) -Has taken tylenol only -Flonase helps some. Using once a day, one spray in each nostril.  -Denies joint aches body aches -Denies disruption of sleep  Urinary incontinence -Going to urologist on 10/31 for bladder leak, wears a pad  - ROS: No nausea, vomiting, diarrhea - Prior smoker  Objective: BP 125/78 mmHg  Pulse 76  Temp(Src) 98.3 F (36.8 C) (Oral)  Ht '5\' 3"'$  (1.6 m)  Wt 201 lb 8 oz (91.4 kg)  BMI 35.70 kg/m2 Gen: Tired appearing 64 y.o. female in no distress HEENT: allergic shiners, could not visualize TMs due to cerumen burden, throat  non-erythematous, nasal turbinates inflamed, nontender to palpation of frontal or maxillary sinuses.  Cardiac: Irregularly irregular rhythm, normal rate, no murmurs Pulm: CTAB Skin: Well-healing scratches of left forearm.   Assessment/Plan: [name] is a 64 y.o. female here for hospital follow-up and nasal congestion. Near syncope -Patient has not fallen since she left the hospital but does endorse continued dizziness. Encouraged her to increase fluid intake.  -Patient to continue ASA 81 mg until follow-up with cardiology. -INR therapeutic at 2. Anticipate stopping ASA at appointment with Dr. Percival Spanish tomorrow.   Ethmoid sinusitis -Counseled patient on how to use flonase, pointing away from nasal bridge. Advised her to use 2 sprays in each nostril twice daily. -Encouraged patient to try hot showers/baths to relieve congestion, as well. -Wrote prescription for omnicef 300 mg bid for 5 days if symptoms continue to persist. Patient does have penicillin allergy, but reaction is itching. Alternative of doxycycline would increase bleeding risk. -Suggested over-the-counter Debrox with lukewarm water to flush to help clear cerumen.

## 2015-09-22 NOTE — Assessment & Plan Note (Signed)
-  Patient has not fallen since she left the hospital but does endorse continued dizziness. Encouraged her to increase fluid intake.  -Patient to continue ASA 81 mg until follow-up with cardiology. -INR therapeutic at 2. Anticipate stopping ASA at appointment with Dr. Percival Spanish tomorrow.

## 2015-09-22 NOTE — Patient Instructions (Addendum)
[name], it was nice to meet you today.  For your continued congestion, try flonase two sprays in each nostril in the morning and at night, spraying towards the sides of your nose. Hot showers/baths and drinking plenty of fluids can help break up congestion.  I will prescribe omnicef 300 mg twice daily for 10 days for you to have if symptoms do not improve in a few more days.  Try over-the-counter Debrox to help reduce earwax.  Thank you!

## 2015-09-22 NOTE — Assessment & Plan Note (Signed)
-  Counseled patient on how to use flonase, pointing away from nasal bridge. Advised her to use 2 sprays in each nostril twice daily. -Encouraged patient to try hot showers/baths to relieve congestion, as well. -Wrote prescription for omnicef 300 mg bid for 5 days if symptoms continue to persist. Patient does have penicillin allergy, but reaction is itching. Alternative of doxycycline would increase bleeding risk. -Suggested over-the-counter Debrox with lukewarm water to flush to help clear cerumen.

## 2015-09-23 ENCOUNTER — Ambulatory Visit (INDEPENDENT_AMBULATORY_CARE_PROVIDER_SITE_OTHER): Payer: Medicare Other

## 2015-09-23 ENCOUNTER — Ambulatory Visit (INDEPENDENT_AMBULATORY_CARE_PROVIDER_SITE_OTHER): Payer: Medicare Other | Admitting: Physician Assistant

## 2015-09-23 ENCOUNTER — Encounter: Payer: Self-pay | Admitting: Physician Assistant

## 2015-09-23 VITALS — BP 100/70 | HR 72 | Ht 63.0 in | Wt 205.0 lb

## 2015-09-23 DIAGNOSIS — I481 Persistent atrial fibrillation: Secondary | ICD-10-CM

## 2015-09-23 DIAGNOSIS — I10 Essential (primary) hypertension: Secondary | ICD-10-CM

## 2015-09-23 DIAGNOSIS — Z7901 Long term (current) use of anticoagulants: Secondary | ICD-10-CM | POA: Diagnosis not present

## 2015-09-23 DIAGNOSIS — E785 Hyperlipidemia, unspecified: Secondary | ICD-10-CM | POA: Diagnosis not present

## 2015-09-23 DIAGNOSIS — I480 Paroxysmal atrial fibrillation: Secondary | ICD-10-CM | POA: Diagnosis not present

## 2015-09-23 DIAGNOSIS — I4819 Other persistent atrial fibrillation: Secondary | ICD-10-CM

## 2015-09-23 LAB — POCT INR: INR: 3.2

## 2015-09-23 NOTE — Progress Notes (Addendum)
Patient ID: [name], female   DOB: 29-Jun-1951, 64 y.o.   MRN: [id number]    Date:  09/23/2015   ID:  [name], DOB Jan 02, 1951, MRN [id number]  PCP:  Clearance Coots, MD  Primary Cardiologist:  Hochrein  Chief complaint: Post hospital follow-up   History of Present Illness: [name] is a 64 y.o. female with history of HTN, CAD s/p PTCA-RCA (1999), HLD, hypothyroidism, persistent atrial fibrillation.  She presented to [facility] on September 2016 with dizziness, heart racing. She had an episode of chest pain which resolved after belching. Troponins were negative.  Patient presents today for posthospital follow-up.  She reports having feeling off balance at times which does not appear to be a new issue. She's had no dizziness or lightheadedness as described previously and she's had no further chest pain.  She does report some nasal congestion and decreased hearing as a result.  She takes care of her 30-year-old grandchild, whom she has custody of, and does not have any symptoms of angina when chasing him around.   She currently denies nausea, vomiting, fever, shortness of breath, orthopnea, PND, cough, congestion, abdominal pain, hematochezia, melena, lower extremity edema, claudication.  Wt Readings from Last 3 Encounters:  09/23/15 205 lb (92.987 kg)  09/22/15 201 lb 8 oz (91.4 kg)  09/12/15 203 lb 11.2 oz (92.398 kg)     Past Medical History  Diagnosis Date  . Hypertension   . CAD (coronary artery disease)     a. reported hx of PCI in 2000 (?records);  b.  Myoview 5/10:  EF 71%, no ischemia  . HLD (hyperlipidemia)   . Hypothyroidism   . SVD (spontaneous vaginal delivery)     x 5  . Atrial fibrillation     Echo 5/11:  EF 65%, NL diast fxn  . Arthritis     wrist, hands - tx with otc meds  . Postmenopausal bleeding 12/07/2012  . Personal history of colonic adenoma 11/11/2004    Current Outpatient Prescriptions  Medication Sig Dispense Refill  . aspirin  81 MG chewable tablet Chew 1 tablet (81 mg total) by mouth daily. 30 tablet 1  . atorvastatin (LIPITOR) 80 MG tablet Take 1 tablet (80 mg total) by mouth daily. 30 tablet 6  . CARTIA XT 300 MG 24 hr capsule TAKE 1 CAPSULE(300 MG) BY MOUTH DAILY 30 capsule 5  . cyclobenzaprine (FLEXERIL) 10 MG tablet Take 0.5-1 tablets (5-10 mg total) by mouth 3 (three) times daily as needed for muscle spasms. 30 tablet 0  . fluticasone (FLONASE) 50 MCG/ACT nasal spray Place 2 sprays into both nostrils daily. 16 g 6  . levothyroxine (SYNTHROID, LEVOTHROID) 150 MCG tablet TAKE 1 TABLET BY MOUTH DAILY 90 tablet 1  . nitroGLYCERIN (NITROSTAT) 0.4 MG SL tablet PLACE 1 TABLET UNDER TONGUE EVERY 5 MINUTES AS NEEDED FOR CHEST PAIN 75 tablet 1  . traMADol (ULTRAM) 50 MG tablet Take 1-2 tablets (50-100 mg total) by mouth every 6 (six) hours as needed. 20 tablet 0  . warfarin (COUMADIN) 5 MG tablet Take 1.5 tablets (7.5 mg total) by mouth every evening. 30 tablet 1  . [DISCONTINUED] diltiazem (DILACOR XR) 120 MG 24 hr capsule Take 1 capsule (120 mg total) by mouth daily. 30 capsule 6   No current facility-administered medications for this visit.    Allergies:    Allergies  Allergen Reactions  . Penicillins Itching  . Percocet [Oxycodone-Acetaminophen] Itching    Social History:  The  patient  reports that she quit smoking about 33 years ago. Her smoking use included Cigarettes. She has a 22.5 pack-year smoking history. She has never used smokeless tobacco. She reports that she drinks about 1.8 oz of alcohol per week. She reports that she does not use illicit drugs.   Family history:   Family History  Problem Relation Age of Onset  . Diabetes Mother   . Heart disease Mother   . Colon cancer Mother 65  . Heart disease Father   . Cancer Brother     intestinal  . Cancer Brother     throat ca  . Cancer Brother     bladder  . Cancer Sister     Lung    ROS:  Please see the history of present illness.  All other  systems reviewed and negative.   PHYSICAL EXAM: VS:  BP 100/70 mmHg  Pulse 72  Ht '5\' 3"'$  (1.6 m)  Wt 205 lb (92.987 kg)  BMI 36.32 kg/m2 Obese, well developed, in no acute distress HEENT: Pupils are equal round react to light accommodation extraocular movements are intact.  Neck: no JVDNo cervical lymphadenopathy. Cardiac:Irregular rate and rhythm  without murmurs rubs or gallops. Lungs:  clear to auscultation bilaterally, no wheezing, rhonchi or rales Abd: soft, nontender, positive bowel sounds all quadrants, no hepatosplenomegaly Ext: no lower extremity edema.  2+ radial and dorsalis pedis pulses. Skin: warm and dry Neuro:  Grossly normal  EKG:  Atrial fibrillation rate 72 bpm   Lipid Panel     Component Value Date/Time   CHOL 229* 07/01/2015 0953   TRIG 129 07/01/2015 0953   HDL 70 07/01/2015 0953   CHOLHDL 3.3 07/01/2015 0953   VLDL 26 07/01/2015 0953   LDLCALC 133* 07/01/2015 0953   LDLDIRECT 125* 06/01/2010 2029     ASSESSMENT AND PLAN:  Problem List Items Addressed This Visit    Long-term (current) use of anticoagulants (Chronic)   HLD (hyperlipidemia) - Primary (Chronic)   Essential hypertension, benign (Chronic)   Atrial fibrillation (Chronic)     Atrial fibrillation, persistent Rate is controlled on Cardizem 300 mg. She is anticoagulated with Coumadin. She had a 2-D echocardiogram September 2016 revealing ejection fraction of 55% with no regional wall motion abnormalities. Peak PA pressure 32 mmHg. Chest pain Remote hx of PCTA (1999). Myoview 5/10: EF 71%, no ischemia.  No current symptoms of angina.    Hyperlipidemia: Continue statin.  Essential hypertension: Patient is mildly hypotensive. Continue current medications.  Long-term use of anticoagulation. Continue Coumadin for stroke reduction with atrial fibrillation  Follow-up in February 2017.

## 2015-09-23 NOTE — Patient Instructions (Signed)
Continue same medications   [name] will call back with coumadin dose and next INR      Your physician wants you to follow-up with Dr.Hochrein in 01/2016. You will receive a reminder letter in the mail two months in advance. If you don't receive a letter, please call our office to schedule the follow-up appointment.

## 2015-10-02 ENCOUNTER — Encounter (HOSPITAL_COMMUNITY): Payer: Self-pay | Admitting: Emergency Medicine

## 2015-10-02 ENCOUNTER — Emergency Department (HOSPITAL_COMMUNITY)
Admission: EM | Admit: 2015-10-02 | Discharge: 2015-10-02 | Disposition: A | Discharge status: Home / Self Care | Payer: Medicare Other | Attending: Emergency Medicine | Admitting: Emergency Medicine

## 2015-10-02 DIAGNOSIS — I2510 Atherosclerotic heart disease of native coronary artery without angina pectoris: Secondary | ICD-10-CM | POA: Diagnosis not present

## 2015-10-02 DIAGNOSIS — Z86018 Personal history of other benign neoplasm: Secondary | ICD-10-CM | POA: Diagnosis not present

## 2015-10-02 DIAGNOSIS — Z79899 Other long term (current) drug therapy: Secondary | ICD-10-CM | POA: Diagnosis not present

## 2015-10-02 DIAGNOSIS — Z880 Allergy status to penicillin: Secondary | ICD-10-CM | POA: Insufficient documentation

## 2015-10-02 DIAGNOSIS — E785 Hyperlipidemia, unspecified: Secondary | ICD-10-CM | POA: Insufficient documentation

## 2015-10-02 DIAGNOSIS — Z87891 Personal history of nicotine dependence: Secondary | ICD-10-CM | POA: Insufficient documentation

## 2015-10-02 DIAGNOSIS — E039 Hypothyroidism, unspecified: Secondary | ICD-10-CM | POA: Diagnosis not present

## 2015-10-02 DIAGNOSIS — M5442 Lumbago with sciatica, left side: Secondary | ICD-10-CM | POA: Insufficient documentation

## 2015-10-02 DIAGNOSIS — M19042 Primary osteoarthritis, left hand: Secondary | ICD-10-CM | POA: Diagnosis not present

## 2015-10-02 DIAGNOSIS — I10 Essential (primary) hypertension: Secondary | ICD-10-CM | POA: Insufficient documentation

## 2015-10-02 DIAGNOSIS — I4891 Unspecified atrial fibrillation: Secondary | ICD-10-CM | POA: Insufficient documentation

## 2015-10-02 DIAGNOSIS — M19041 Primary osteoarthritis, right hand: Secondary | ICD-10-CM | POA: Insufficient documentation

## 2015-10-02 DIAGNOSIS — M545 Low back pain: Secondary | ICD-10-CM | POA: Diagnosis present

## 2015-10-02 DIAGNOSIS — Z7951 Long term (current) use of inhaled steroids: Secondary | ICD-10-CM | POA: Insufficient documentation

## 2015-10-02 DIAGNOSIS — M19039 Primary osteoarthritis, unspecified wrist: Secondary | ICD-10-CM | POA: Diagnosis not present

## 2015-10-02 MED ORDER — HYDROCODONE-ACETAMINOPHEN 5-325 MG PO TABS: 1.0000 | ORAL_TABLET | Freq: Four times a day (QID) | ORAL | Status: DC | PRN

## 2015-10-02 MED ORDER — HYDROCODONE-ACETAMINOPHEN 5-325 MG PO TABS
1.0000 | ORAL_TABLET | Freq: Once | ORAL | Status: AC
  Administered 2015-10-02: 1 via ORAL
  Filled 2015-10-02: qty 1

## 2015-10-02 MED ORDER — ONDANSETRON 4 MG PO TBDP
4.0000 mg | ORAL_TABLET | Freq: Once | ORAL | Status: AC
  Administered 2015-10-02: 4 mg via ORAL
  Filled 2015-10-02: qty 1

## 2015-10-02 NOTE — ED Provider Notes (Signed)
CSN: [id number]     Arrival date & time 10/02/15  0915 History   First MD Initiated Contact with Patient 10/02/15 [phone]     Chief Complaint  Patient presents with  . Hip Pain     (Consider location/radiation/quality/duration/timing/severity/associated sxs/prior Treatment) Patient is a 64 y.o. female presenting with hip pain. The history is provided by the patient.  Hip Pain Pertinent negatives include no chest pain, no abdominal pain, no headaches and no shortness of breath.   patient with complaint of left low back pain radiating into the buttocks and down the back of the left leg at about 8:00 PM yesterday. Patient had a hip replacement and the 2011. Patient without any fall or injury. Patients had back pain similar to this in the past. Has had x-rays of her back. Patient denies any difficulty urinating she has some baseline urinary incontinence. Patient denies any numbness or weakness to her left foot. Pain is 8 out of 10. Sharp in nature.  Past Medical History  Diagnosis Date  . Hypertension   . CAD (coronary artery disease)     a. reported hx of PCI in 2000 (?records);  b.  Myoview 5/10:  EF 71%, no ischemia  . HLD (hyperlipidemia)   . Hypothyroidism   . SVD (spontaneous vaginal delivery)     x 5  . Atrial fibrillation ([place])     Echo 5/11:  EF 65%, NL diast fxn  . Arthritis     wrist, hands - tx with otc meds  . Postmenopausal bleeding 12/07/2012  . Personal history of colonic adenoma 11/11/2004   Past Surgical History  Procedure Laterality Date  . Tubal ligation  1974  . Dilation and curettage of uterus  2013    hysteroscopy  . Joint replacement Left 2010    left hip   . Hysteroscopy w/d&c  12/07/2012    Procedure: DILATATION AND CURETTAGE /HYSTEROSCOPY;  Surgeon: Elveria Royals, MD;  Location: [facility];  Service: Gynecology;  Laterality: N/A;  Dilitation and curettage /hysteroscopy/biopsey of lower segment of uterus   Family History  Problem Relation Age of Onset  .  Diabetes Mother   . Heart disease Mother   . Colon cancer Mother 12  . Heart disease Father   . Cancer Brother     intestinal  . Cancer Brother     throat ca  . Cancer Brother     bladder  . Cancer Sister     Lung   Social History  Substance Use Topics  . Smoking status: Former Smoker -- 1.50 packs/day for 15 years    Types: Cigarettes    Quit date: 11/09/1981  . Smokeless tobacco: Never Used  . Alcohol Use: 1.8 oz/week    3 Cans of beer per week     Comment: weekly   OB History    No data available     Review of Systems  Constitutional: Negative for fever.  HENT: Negative for congestion.   Eyes: Negative for visual disturbance.  Respiratory: Negative for shortness of breath.   Cardiovascular: Negative for chest pain.  Gastrointestinal: Negative for nausea, vomiting and abdominal pain.  Genitourinary: Negative for dysuria and difficulty urinating.  Musculoskeletal: Positive for back pain.  Skin: Negative for rash.  Neurological: Negative for headaches.  Hematological: Bruises/bleeds easily.  Psychiatric/Behavioral: Negative for confusion.      Allergies  Penicillins and Percocet  Home Medications   Prior to Admission medications   Medication Sig Start Date End Date Taking?  Authorizing Provider  aspirin 81 MG chewable tablet Chew 1 tablet (81 mg total) by mouth daily. 09/12/15   Nicolette Bang, DO  atorvastatin (LIPITOR) 80 MG tablet Take 1 tablet (80 mg total) by mouth daily. 06/27/15   Brittainy M Simmons, PA-C  CARTIA XT 300 MG 24 hr capsule TAKE 1 CAPSULE(300 MG) BY MOUTH DAILY 09/02/15   Minus Breeding, MD  cyclobenzaprine (FLEXERIL) 10 MG tablet Take 0.5-1 tablets (5-10 mg total) by mouth 3 (three) times daily as needed for muscle spasms. 03/20/15   Olam Idler, MD  fluticasone (FLONASE) 50 MCG/ACT nasal spray Place 2 sprays into both nostrils daily. 11/08/14   Rosemarie Ax, MD  HYDROcodone-acetaminophen (NORCO/VICODIN) 5-325 MG tablet Take 1-2  tablets by mouth every 6 (six) hours as needed for moderate pain. 10/02/15   Fredia Sorrow, MD  levothyroxine (SYNTHROID, LEVOTHROID) 150 MCG tablet TAKE 1 TABLET BY MOUTH DAILY 05/03/15   Rosemarie Ax, MD  nitroGLYCERIN (NITROSTAT) 0.4 MG SL tablet PLACE 1 TABLET UNDER TONGUE EVERY 5 MINUTES AS NEEDED FOR CHEST PAIN 09/15/15   Rosemarie Ax, MD  traMADol (ULTRAM) 50 MG tablet Take 1-2 tablets (50-100 mg total) by mouth every 6 (six) hours as needed. 03/20/15   Olam Idler, MD  warfarin (COUMADIN) 5 MG tablet Take 1.5 tablets (7.5 mg total) by mouth every evening. 09/12/15   Nicolette Bang, DO   BP 138/76 mmHg  Pulse 80  Temp(Src) 98.1 F (36.7 C) (Oral)  Resp 17  Ht '5\' 3"'$  (1.6 m)  Wt 201 lb (91.173 kg)  BMI 35.61 kg/m2  SpO2 96% Physical Exam  Constitutional: She is oriented to person, place, and time. She appears well-developed and well-nourished. No distress.  HENT:  Head: Normocephalic and atraumatic.  Mouth/Throat: Oropharynx is clear and moist.  Eyes: Conjunctivae and EOM are normal. Pupils are equal, round, and reactive to light.  Neck: Normal range of motion. Neck supple.  Cardiovascular: Normal rate, regular rhythm and normal heart sounds.   No murmur heard. Pulmonary/Chest: Effort normal and breath sounds normal. No respiratory distress.  Abdominal: Soft. Bowel sounds are normal. There is no tenderness.  Musculoskeletal: Normal range of motion. She exhibits no tenderness.  No tenderness to palpation to the left lower lumbar area or buttocks area. Patient without any pain in the anterior lateral aspect of the hip. Patient with good range of motion there. Distally no numbness to the left foot no weakness to the toes. Dorsalis pedis was 2+.  Neurological: She is alert and oriented to person, place, and time. No cranial nerve deficit. She exhibits normal muscle tone. Coordination normal.  Skin: Skin is warm. No rash noted.  Nursing note and vitals reviewed.   ED  Course  Procedures (including critical care time) Labs Review Labs Reviewed - No data to display  Imaging Review No results found. I have personally reviewed and evaluated these images and lab results as part of my medical decision-making.   EKG Interpretation None      MDM   Final diagnoses:  Left-sided low back pain with left-sided sciatica    Patient without fall or injury. Patient's pain is left lower back and buttocks area not really over the hip area. Pain radiated down the back of her leg. Not associated with any numbness or weakness to the left foot. No incontinence.  Patient offered x-ray of the back previous x-ray showed the abnormalities at L4-L5. Patient does have a component of osteoporosis. Patient is  aware that she can get some collapse of bones and nose area but she prefers not to have x-ray done today. Improve significantly with the hydrocodone for pain control.  If symptoms persist patient understands that she'll probably need follow-up x-ray and/or MRI. Patient's had similar pain in the past that resolved with rest.    Fredia Sorrow, MD 10/02/15 1131

## 2015-10-02 NOTE — ED Notes (Signed)
Pt sts left hip pain starting last night; pt sts hx of hip replacement; pt denies obvious injury

## 2015-10-02 NOTE — Discharge Instructions (Signed)
Take the pain medication as directed. Return for any new or worse symptoms. If symptoms do not resolve it would be appropriate to have MRI of the back. Also if symptoms don't resolve it would be appropriate to have x-rays of the lumbar spine.

## 2015-10-05 IMAGING — CT CT HEAD W/O CM
1 series · 16 of 30 positions shown, 20 images · non-contrast
Comparison: None.

CLINICAL DATA: Syncope.

EXAM:
CT HEAD WITHOUT CONTRAST
TECHNIQUE: Contiguous axial images were obtained from the base of the skull
through the vertex without intravenous contrast.

[Series 2: head 5.0 h31s · axial · 0.47mm/px · z∈[-104,+31]mm · 16 of 31 slices shown, 20 images]
[im 2/31  brain]
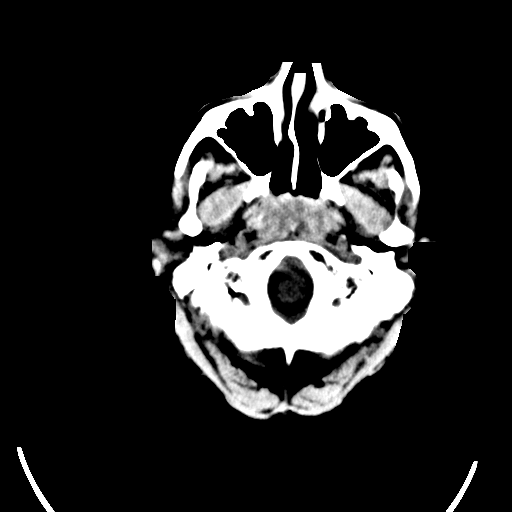
[im 2/31  bone]
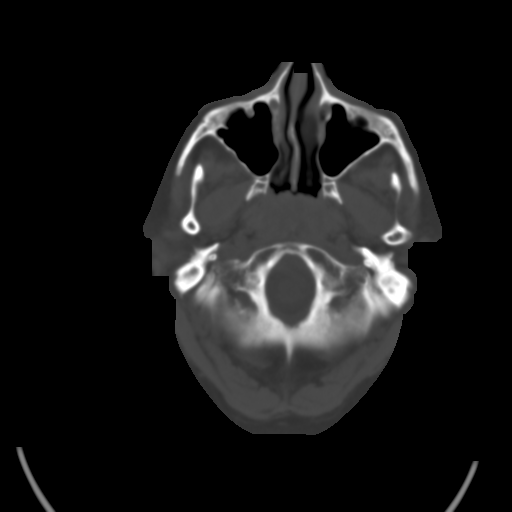
[im 4/31  brain]
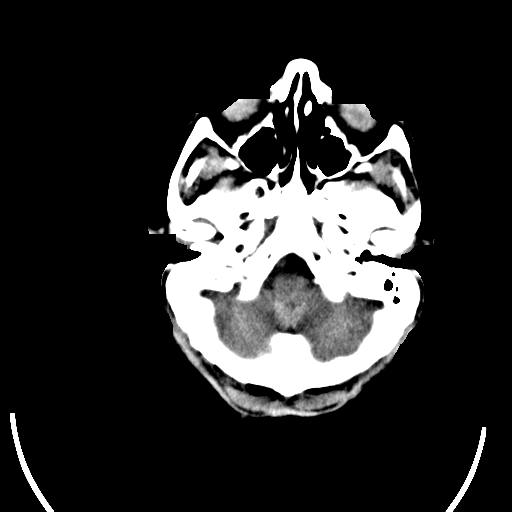
[im 6/31  brain]
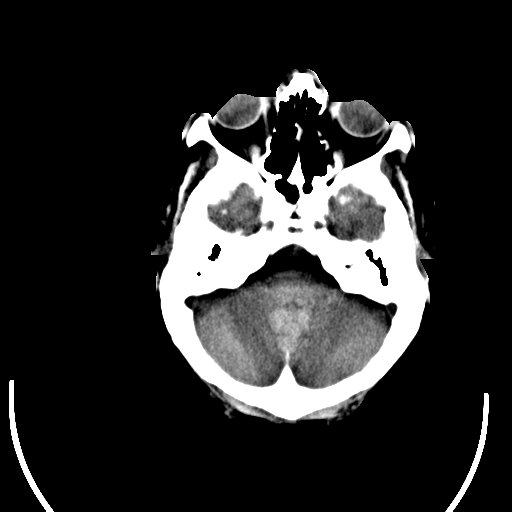
[im 8/31  brain]
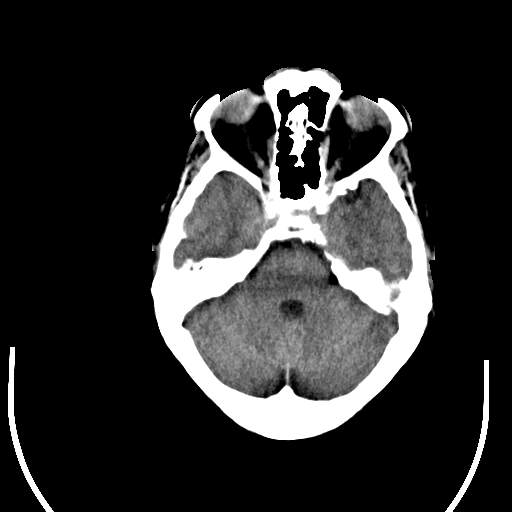
[im 9/31  brain]
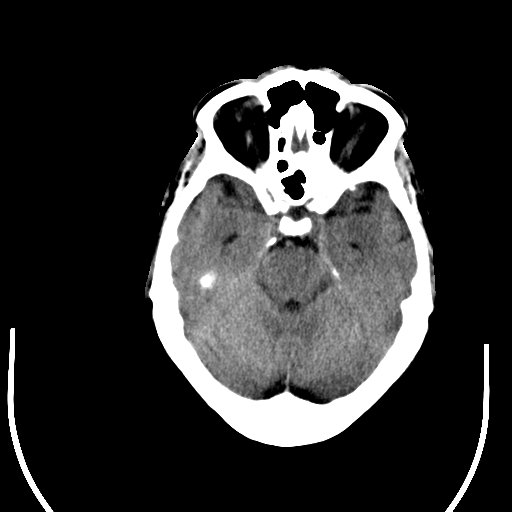
[im 9/31  bone]
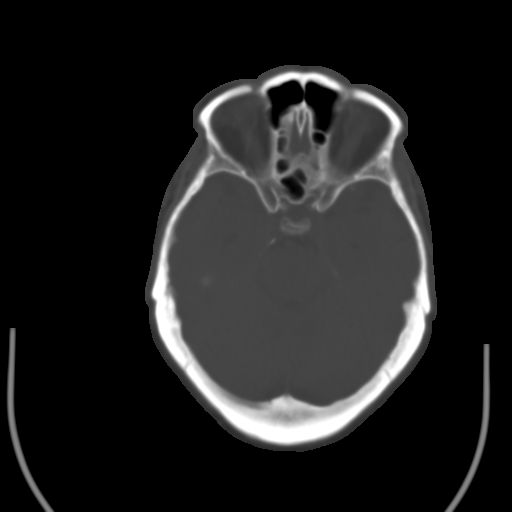
[im 11/31  brain]
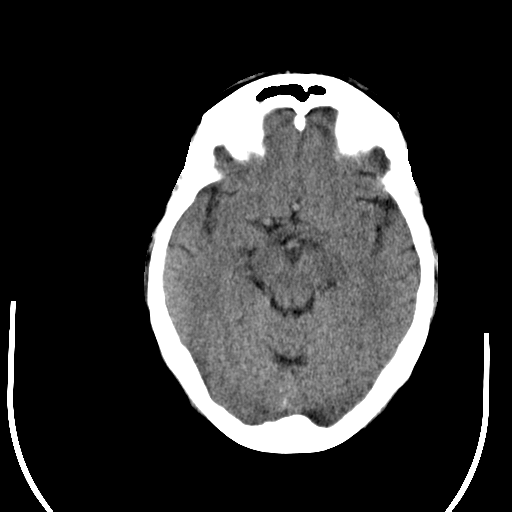
[im 13/31  brain]
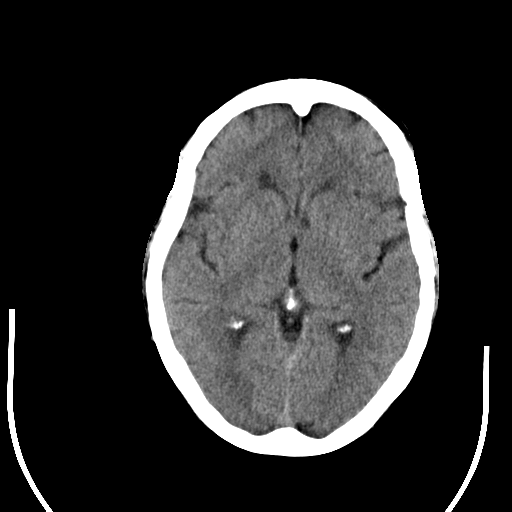
[im 15/31  brain]
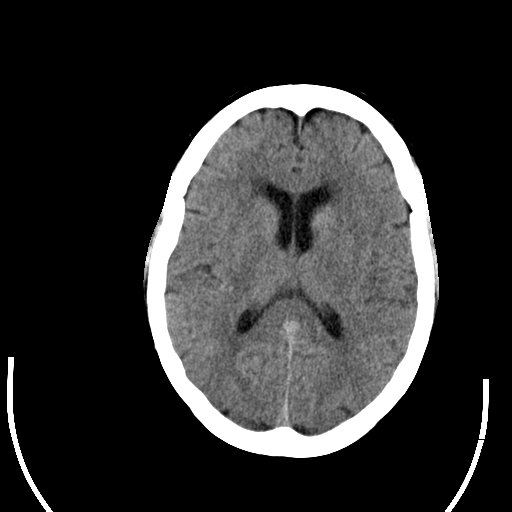
[im 16/31  brain]
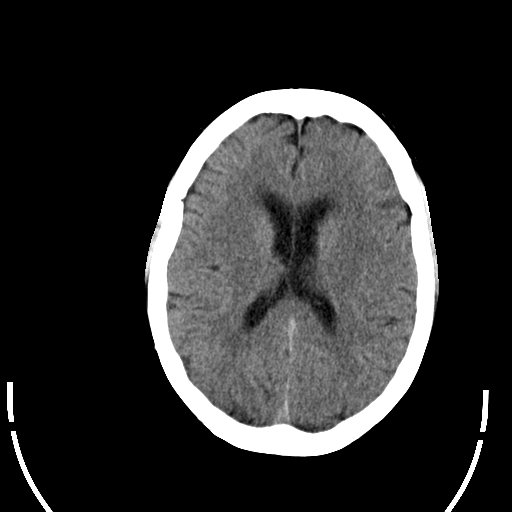
[im 16/31  bone]
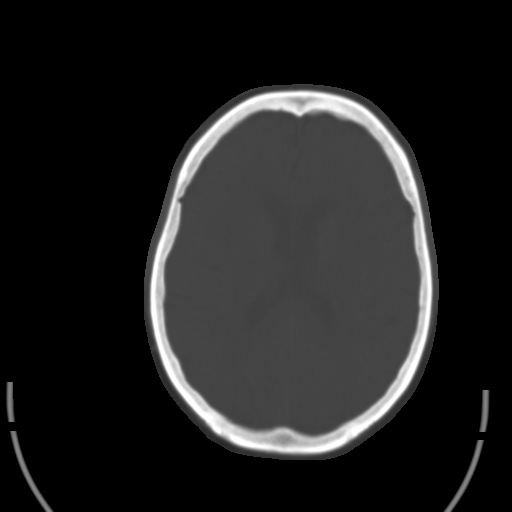
[im 18/31  brain]
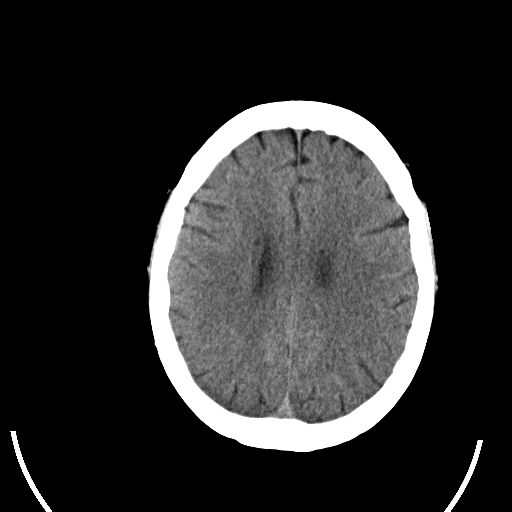
[im 20/31  brain]
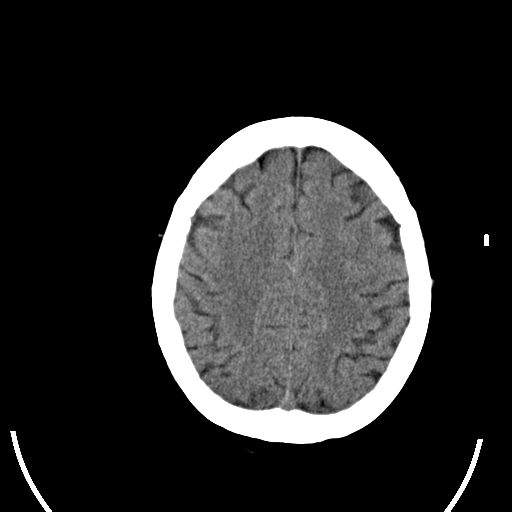
[im 22/31  brain]
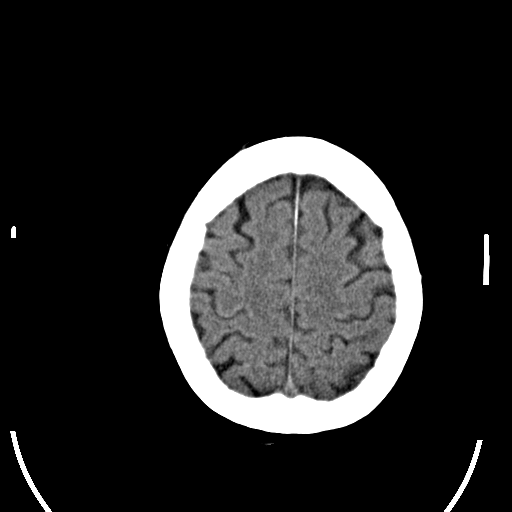
[im 23/31  brain]
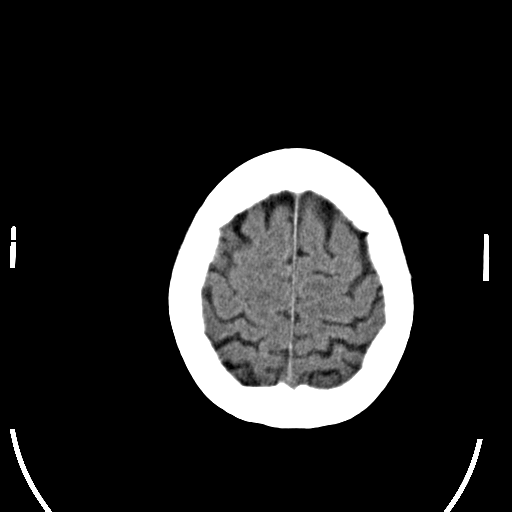
[im 23/31  bone]
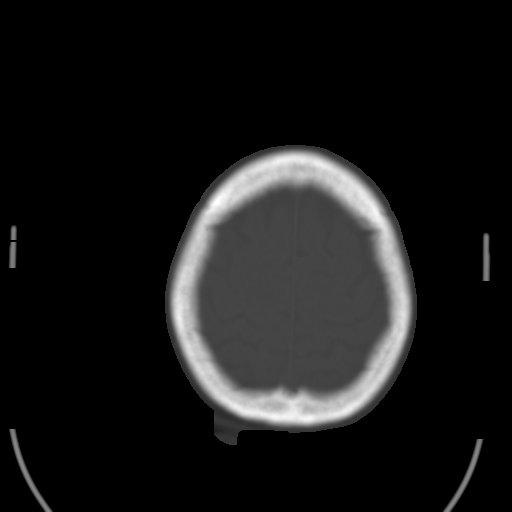
[im 25/31  brain]
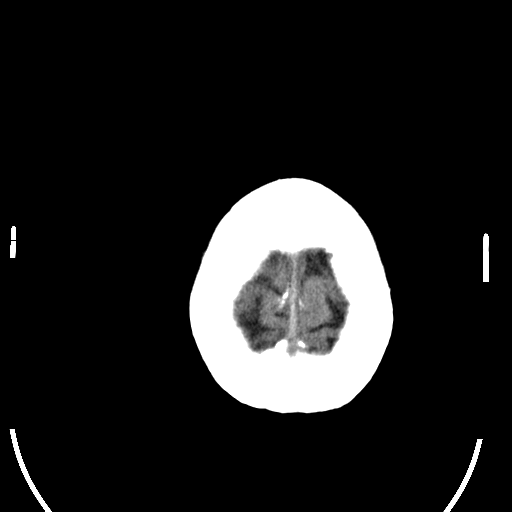
[im 27/31  brain]
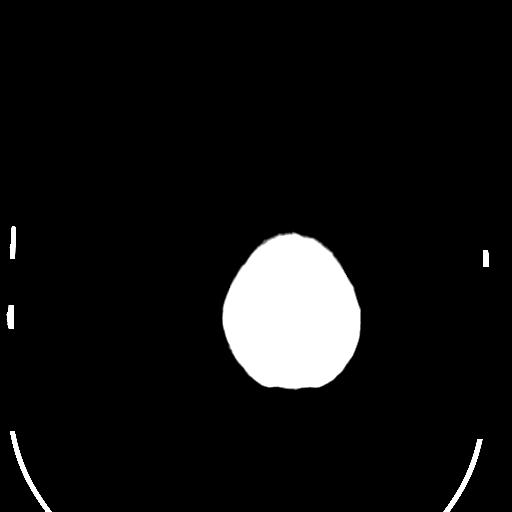
[im 29/31  brain]
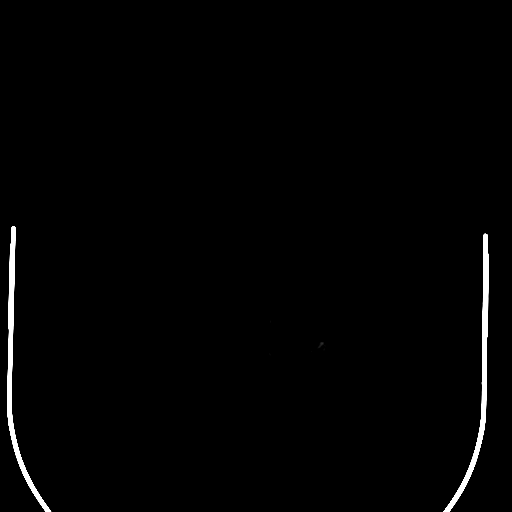

[16 of 30 positions shown; findings below may reference images not displayed]

FINDINGS: Skull and Sinuses:Symmetric but abnormally thick nasopharyngeal
tissues. The appearance favors adenoid enlargement. The tissue
causes obstructive changes in the bilateral mastoids where there is
subtotal opacification. No bony erosive changes.

Remote blowout fracture of the medial wall left orbit. No acute
fracture.

Orbits: No acute abnormality.

Brain: Significant streak artifact at the level of the mid and lower
brain stem.

No evidence of acute abnormality, such as acute infarction,
hemorrhage, hydrocephalus, or mass lesion/mass effect.

Chronic small-vessel disease with ischemic gliosis confluent around
the lateral ventricles.
IMPRESSION: 1. No acute intracranial findings.
2. Abnormal nasopharyngeal soft tissue, causing bilateral mastoid
disease. Recommend ENT referral for visualization.
3. Chronic small vessel disease.

## 2015-10-05 IMAGING — CR DG CHEST 2V
2 series · 2 of 2 positions shown · non-contrast
Comparison: 12/07/2010

CLINICAL DATA: Cough. Near syncope yesterday. Lower chest soreness.

EXAM:
CHEST  2 VIEW

[w chest pa]
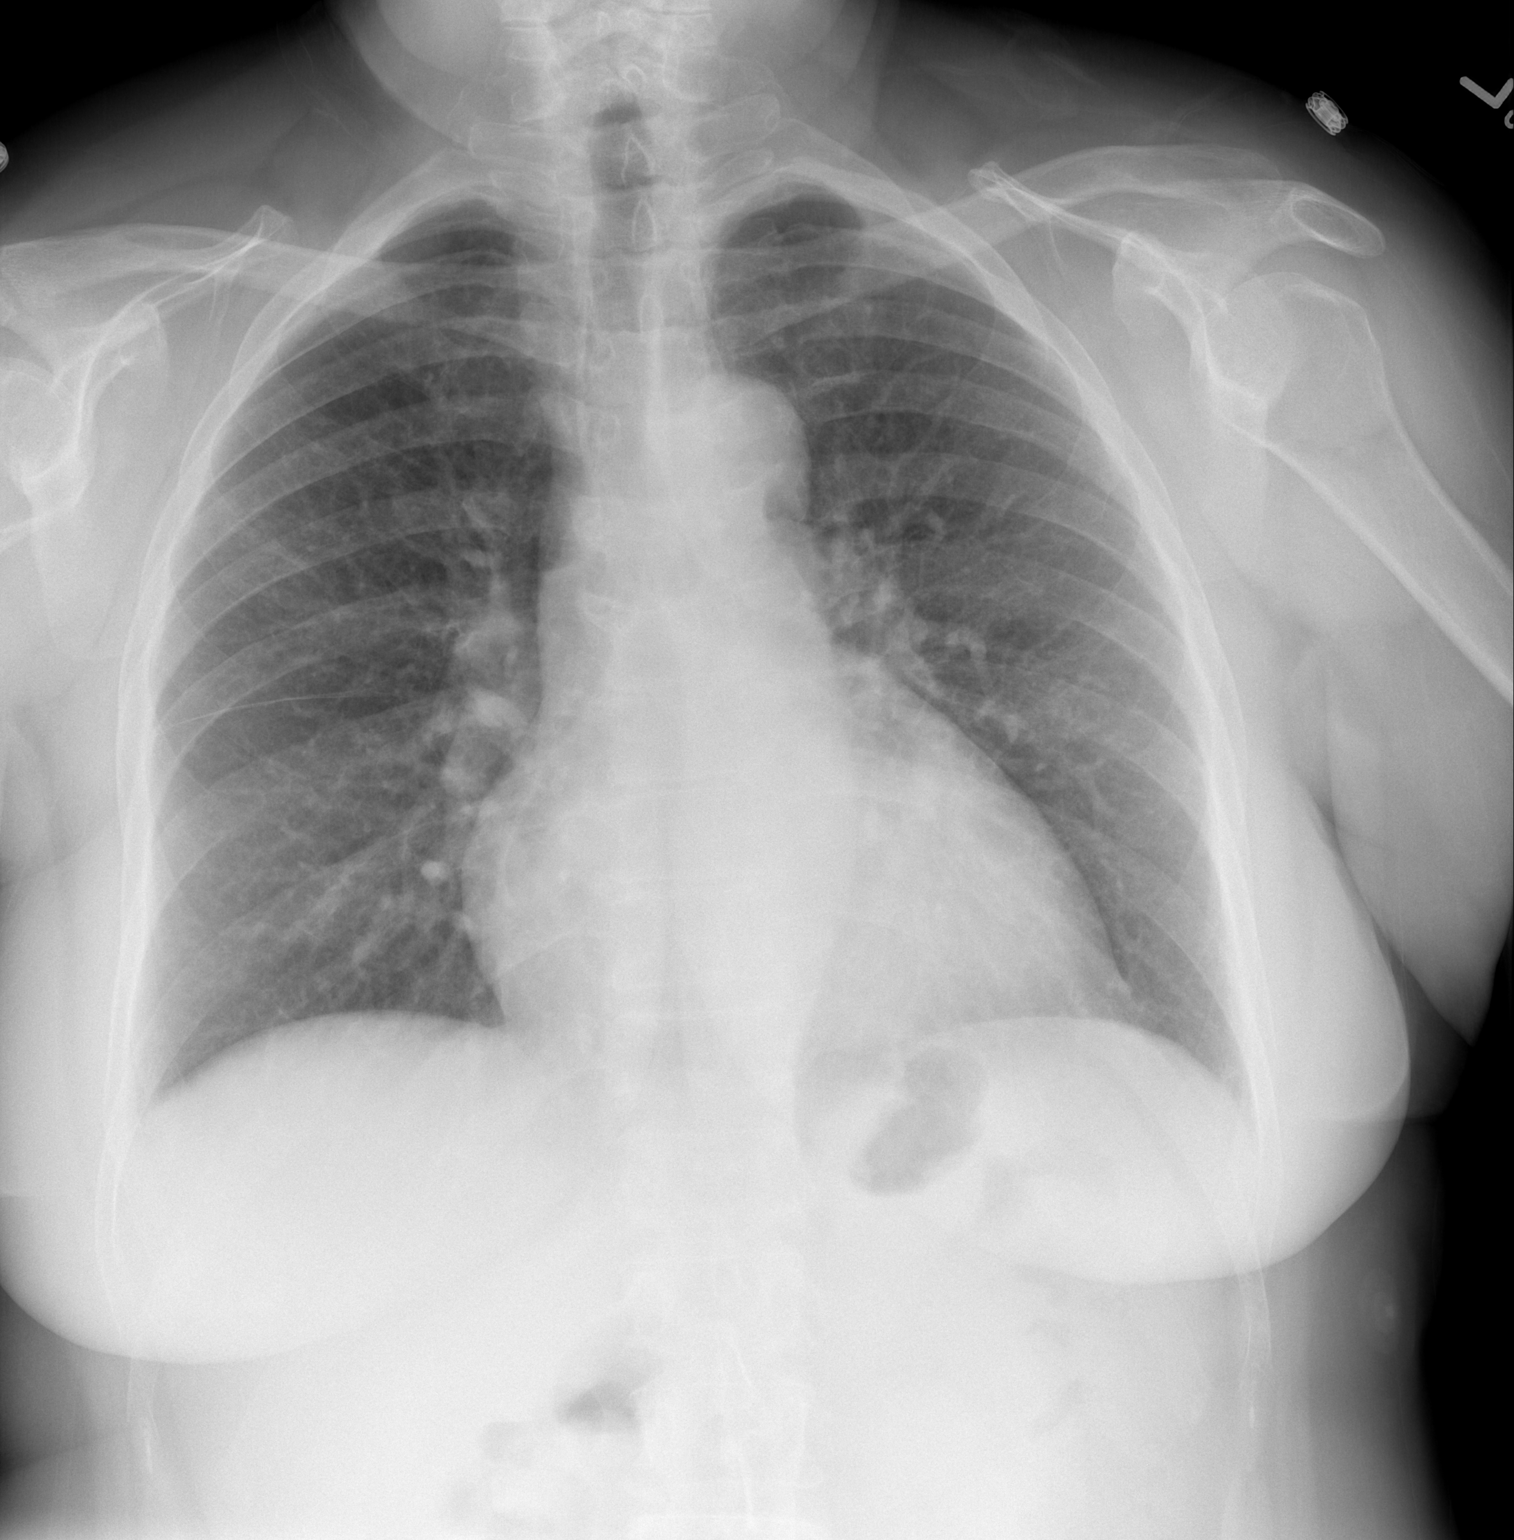

[w chest lat]
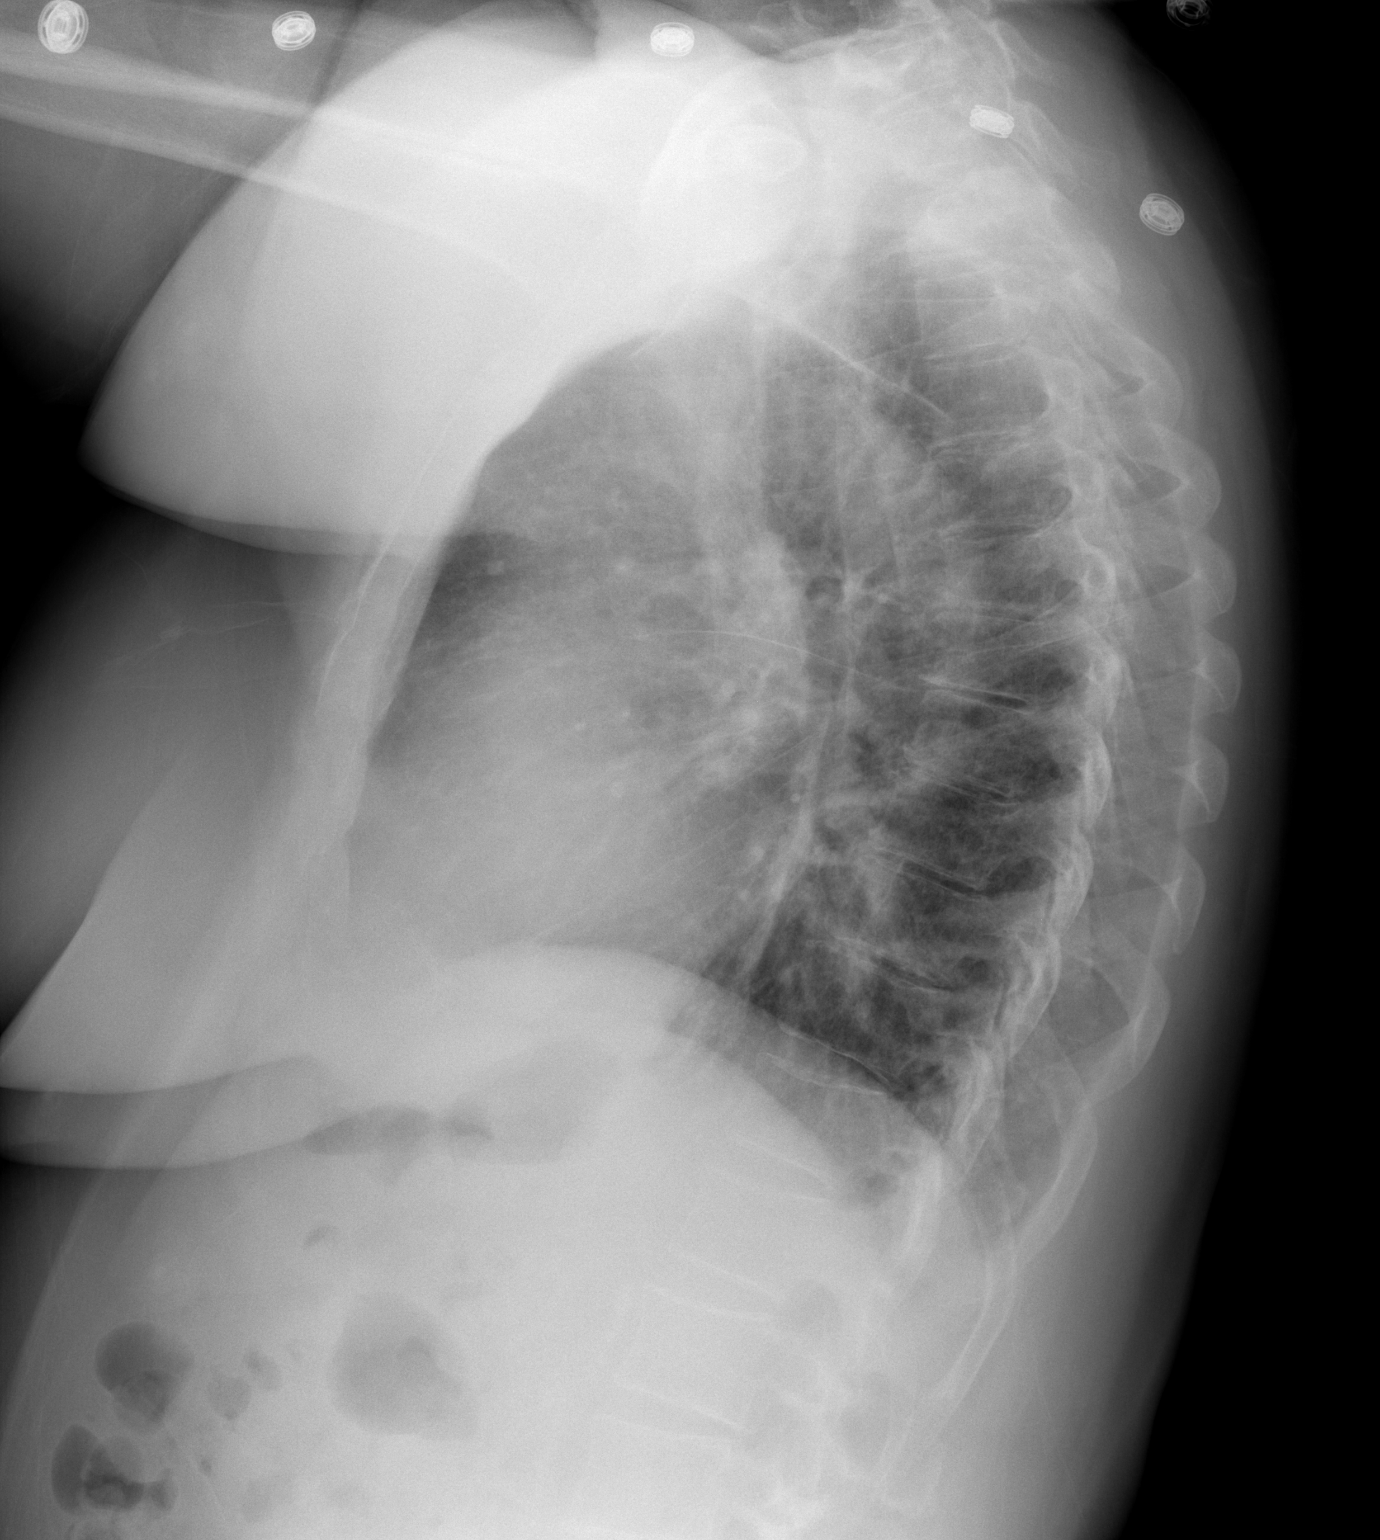

[2 of 2 positions shown; findings below may reference images not displayed]

FINDINGS: The patient has new cardiomegaly. There is also new peribronchial
thickening. Pulmonary vascularity is normal. No effusions. No acute
osseous abnormality.
IMPRESSION: 1. Bronchitic changes.
2. New slight cardiomegaly.

## 2015-10-06 ENCOUNTER — Ambulatory Visit: Payer: Medicare Other | Admitting: Pharmacist Clinician (PhC)/ Clinical Pharmacy Specialist

## 2015-10-07 ENCOUNTER — Emergency Department (HOSPITAL_COMMUNITY)
Admission: EM | Admit: 2015-10-07 | Discharge: 2015-10-07 | Disposition: A | Discharge status: Home / Self Care | Payer: Medicare Other | Source: Home / Self Care | Attending: Emergency Medicine | Admitting: Emergency Medicine

## 2015-10-07 ENCOUNTER — Encounter (HOSPITAL_COMMUNITY): Payer: Self-pay | Admitting: Emergency Medicine

## 2015-10-07 DIAGNOSIS — J018 Other acute sinusitis: Secondary | ICD-10-CM | POA: Diagnosis not present

## 2015-10-07 DIAGNOSIS — H6123 Impacted cerumen, bilateral: Secondary | ICD-10-CM

## 2015-10-07 MED ORDER — AZITHROMYCIN 250 MG PO TABS: ORAL_TABLET | ORAL | Status: DC

## 2015-10-07 NOTE — ED Notes (Signed)
Pt has been suffering from sinus/nasal congestion for one month along with ear fullness to the point she is very hard of hearing.  It is causing her to be off balance and have dizzy spells.  She states this all started after she got her flu and pneumonia shots.  Pt is currently in A-fib and was seen in the ED 2 weeks ago for her dizzy spells and the A-fib.

## 2015-10-07 NOTE — Discharge Instructions (Signed)
We cleaned out your ears today. Please take azithromycin as prescribed for your sinuses. There is a small chance that the azithromycin will affect your warfarin levels. Please call your cardiologist to let him know you are taking the azithromycin. Follow-up as needed.

## 2015-10-07 NOTE — ED Provider Notes (Signed)
CSN: [id number]     Arrival date & time 10/07/15  1556 History   First MD Initiated Contact with Patient 10/07/15 1710     Chief Complaint  Patient presents with  . Ear Fullness  . sinus congestion    (Consider location/radiation/quality/duration/timing/severity/associated sxs/prior Treatment) HPI  She is a 64 year old woman here for evaluation of difficulty hearing and sinus congestion. She states her symptoms started about a month ago, right after she got her flu and pneumonia shots. She describes nasal congestion and sinus pressure. She reports a very mild cough. She denies any ear pain, but cannot really hear much out of her ears. She has also developed some pressure just above her left eye. This is associated with a few minutes of blurred vision that resolved spontaneously. No fevers or chills. No nausea or vomiting. No shortness of breath. She was recently diagnosed with A. fib and is on Coumadin. She was seen at her PCPs office for these symptoms about 2 weeks ago. She has been taking Flonase with minimal improvement of symptoms.  When she saw her PCP they did give her a prescription for Omnicef to take if her symptoms are not improving. She did not fill that prescription as she has an allergy to penicillins.  Past Medical History  Diagnosis Date  . Hypertension   . CAD (coronary artery disease)     a. reported hx of PCI in 2000 (?records);  b.  Myoview 5/10:  EF 71%, no ischemia  . HLD (hyperlipidemia)   . Hypothyroidism   . SVD (spontaneous vaginal delivery)     x 5  . Atrial fibrillation ([place])     Echo 5/11:  EF 65%, NL diast fxn  . Arthritis     wrist, hands - tx with otc meds  . Postmenopausal bleeding 12/07/2012  . Personal history of colonic adenoma 11/11/2004   Past Surgical History  Procedure Laterality Date  . Tubal ligation  1974  . Dilation and curettage of uterus  2013    hysteroscopy  . Joint replacement Left 2010    left hip   . Hysteroscopy w/d&c  12/07/2012     Procedure: DILATATION AND CURETTAGE /HYSTEROSCOPY;  Surgeon: Elveria Royals, MD;  Location: [facility];  Service: Gynecology;  Laterality: N/A;  Dilitation and curettage /hysteroscopy/biopsey of lower segment of uterus   Family History  Problem Relation Age of Onset  . Diabetes Mother   . Heart disease Mother   . Colon cancer Mother 2  . Heart disease Father   . Cancer Brother     intestinal  . Cancer Brother     throat ca  . Cancer Brother     bladder  . Cancer Sister     Lung   Social History  Substance Use Topics  . Smoking status: Former Smoker -- 1.50 packs/day for 15 years    Types: Cigarettes    Quit date: 11/09/1981  . Smokeless tobacco: Never Used  . Alcohol Use: 1.8 oz/week    3 Cans of beer per week     Comment: weekly   OB History    No data available     Review of Systems As in history of present illness Allergies  Penicillins and Percocet  Home Medications   Prior to Admission medications   Medication Sig Start Date End Date Taking? Authorizing Provider  aspirin 81 MG chewable tablet Chew 1 tablet (81 mg total) by mouth daily. 09/12/15  Yes Nicolette Bang, DO  atorvastatin (LIPITOR) 80 MG tablet Take 1 tablet (80 mg total) by mouth daily. 06/27/15  Yes Brittainy M Simmons, PA-C  CARTIA XT 300 MG 24 hr capsule TAKE 1 CAPSULE(300 MG) BY MOUTH DAILY 09/02/15  Yes Minus Breeding, MD  levothyroxine (SYNTHROID, LEVOTHROID) 150 MCG tablet TAKE 1 TABLET BY MOUTH DAILY 05/03/15  Yes Rosemarie Ax, MD  warfarin (COUMADIN) 5 MG tablet Take 1.5 tablets (7.5 mg total) by mouth every evening. 09/12/15  Yes Nicolette Bang, DO  azithromycin (ZITHROMAX Z-PAK) 250 MG tablet Take 2 pills today, then 1 pill daily until gone. 10/07/15   Melony Overly, MD  cyclobenzaprine (FLEXERIL) 10 MG tablet Take 0.5-1 tablets (5-10 mg total) by mouth 3 (three) times daily as needed for muscle spasms. 03/20/15   Olam Idler, MD  fluticasone (FLONASE) 50 MCG/ACT nasal spray  Place 2 sprays into both nostrils daily. 11/08/14   Rosemarie Ax, MD  HYDROcodone-acetaminophen (NORCO/VICODIN) 5-325 MG tablet Take 1-2 tablets by mouth every 6 (six) hours as needed for moderate pain. 10/02/15   Fredia Sorrow, MD  nitroGLYCERIN (NITROSTAT) 0.4 MG SL tablet PLACE 1 TABLET UNDER TONGUE EVERY 5 MINUTES AS NEEDED FOR CHEST PAIN 09/15/15   Rosemarie Ax, MD  traMADol (ULTRAM) 50 MG tablet Take 1-2 tablets (50-100 mg total) by mouth every 6 (six) hours as needed. 03/20/15   Olam Idler, MD   Meds Ordered and Administered this Visit  Medications - No data to display  BP 139/85 mmHg  Pulse 95  Temp(Src) 98.5 F (36.9 C) (Oral)  SpO2 97% No data found.   Physical Exam  Constitutional: She is oriented to person, place, and time. She appears well-developed and well-nourished. No distress.  HENT:  Nose: Nose normal.  Mouth/Throat: Oropharynx is clear and moist. No oropharyngeal exudate.  Bilateral ear canals with cerumen impaction. She does have some sinus tenderness.  Eyes: Conjunctivae are normal.  Neck: Neck supple.  Cardiovascular:  Irregularly irregular  Pulmonary/Chest: Effort normal.  Lymphadenopathy:    She has no cervical adenopathy.  Neurological: She is alert and oriented to person, place, and time.    ED Course  Procedures (including critical care time)  Labs Review Labs Reviewed - No data to display  Imaging Review No results found.    MDM   1. Cerumen impaction, bilateral   2. Other acute sinusitis    Eardrums are normal. Her hearing is much improved after ear wax removal. We'll treat with azithromycin for sinusitis. Requested that she call her cardiologist to let them know she is on this antibiotic as they may want to recheck her INR sooner. Follow-up with PCP as needed.    Melony Overly, MD 10/07/15 [phone]

## 2015-10-08 ENCOUNTER — Ambulatory Visit: Payer: Medicare Other | Admitting: Pharmacist Clinician (PhC)/ Clinical Pharmacy Specialist

## 2015-10-08 ENCOUNTER — Ambulatory Visit (INDEPENDENT_AMBULATORY_CARE_PROVIDER_SITE_OTHER): Payer: Medicare Other | Admitting: Pharmacist

## 2015-10-08 DIAGNOSIS — I480 Paroxysmal atrial fibrillation: Secondary | ICD-10-CM

## 2015-10-08 DIAGNOSIS — I481 Persistent atrial fibrillation: Secondary | ICD-10-CM

## 2015-10-08 DIAGNOSIS — Z7901 Long term (current) use of anticoagulants: Secondary | ICD-10-CM

## 2015-10-08 DIAGNOSIS — I4819 Other persistent atrial fibrillation: Secondary | ICD-10-CM

## 2015-10-08 LAB — POCT INR: INR: 2.4

## 2015-10-20 ENCOUNTER — Encounter: Payer: Self-pay | Admitting: Family Medicine

## 2015-10-20 ENCOUNTER — Ambulatory Visit (INDEPENDENT_AMBULATORY_CARE_PROVIDER_SITE_OTHER): Payer: Medicare Other | Admitting: Family Medicine

## 2015-10-20 VITALS — BP 130/78 | HR 85 | Temp 98.1°F | Ht 63.0 in | Wt 203.0 lb

## 2015-10-20 DIAGNOSIS — M5442 Lumbago with sciatica, left side: Secondary | ICD-10-CM | POA: Diagnosis not present

## 2015-10-20 DIAGNOSIS — M549 Dorsalgia, unspecified: Secondary | ICD-10-CM | POA: Insufficient documentation

## 2015-10-20 MED ORDER — HYDROCODONE-ACETAMINOPHEN 5-325 MG PO TABS: 1.0000 | ORAL_TABLET | Freq: Four times a day (QID) | ORAL | Status: DC | PRN

## 2015-10-20 MED ORDER — CYCLOBENZAPRINE HCL 10 MG PO TABS: 5.0000 mg | ORAL_TABLET | Freq: Three times a day (TID) | ORAL | Status: DC | PRN

## 2015-10-20 NOTE — Patient Instructions (Signed)
Thank you for coming in,   I am refilling the Norco but only to be taken when the pain is severe. DO NOT take norco prior to driving.   Please take ibuprofen or tylenol when the pain is mild to moderate.   I am refilling the flexeril but this should be taken once at night in the beginning. It can causes drowsiness and DO NOT take prior to driving.   Please follow up with me in 4-6 weeks if your pain is not improved.   Please bring all of your medications with you to each visit.   Sign up for My Chart to have easy access to your labs results, and communication with your Primary care physician   Please feel free to call with any questions or concerns at any time, at [phone]. --Dr. Raeford Razor

## 2015-10-20 NOTE — Progress Notes (Signed)
Subjective:    [name] - 64 y.o. female MRN [id number]  Date of birth: 01/13/1951  HPI  [name] is here for back pain.  Back pain: She is having left-sided lower back pain. She was evaluated in the ED on October 6. No imaging was obtained during that visit but was evaluated from previous imaging. She denies any saddle paresthesia or bladder or bowel incontinence. Pain is a 7 out of 10 without medication and improved to 1 out of 10 with the Norco. She is having radiculopathy bilaterally. The right side went to her knee and the left side went down to her foot. This has resolved at this time. She's had history of pain like this previously but not this severe. She has a history of left hip replacement performed in 2011. The pain is improved when she sits down exacerbated when she is walking. She denies any previous surgery on her back.   Health Maintenance:  Health Maintenance Due  Topic Date Due  . Hepatitis C Screening  08/16/1951    -  reports that she quit smoking about 33 years ago. Her smoking use included Cigarettes. She has a 22.5 pack-year smoking history. She has never used smokeless tobacco. - Review of Systems: Per HPI. - Past Medical History: Patient Active Problem List   Diagnosis Date Noted  . Back pain 10/20/2015  . Ethmoid sinusitis 09/22/2015  . Near syncope 09/11/2015  . Atrial fibrillation with RVR ([place]) 09/11/2015  . Abnormal vaginal bleeding 05/06/2015  . Hematuria 05/06/2015  . Disease of middle ear or mastoid 04/10/2015  . Long-term (current) use of anticoagulants 02/19/2015  . Long QT interval 11/08/2014  . Bradycardia, sinus   . Family history of colon cancer - mother in 32's 06/04/2014  . Prediabetes 04/11/2014  . Atrial fibrillation ([place]) 12/18/2013  . Depression, major ([place]) 04/19/2012  . Colon polyps 05/06/2011  . Postmenopausal bleeding 05/06/2011  . DEGENERATIVE JOINT DISEASE, LEFT HIP 07/20/2010  . HLD (hyperlipidemia)  04/29/2009  . OBESITY, UNSPECIFIED 04/29/2009  . Essential hypertension, benign 04/29/2009  . Coronary atherosclerosis 04/29/2009  . Hypothyroidism 02/23/2007  . MYOCARDIAL INFARCTION, OLD 02/23/2007  . Personal history of colonic adenoma 11/11/2004   - Medications: reviewed and updated Current Outpatient Prescriptions  Medication Sig Dispense Refill  . aspirin 81 MG chewable tablet Chew 1 tablet (81 mg total) by mouth daily. 30 tablet 1  . atorvastatin (LIPITOR) 80 MG tablet Take 1 tablet (80 mg total) by mouth daily. 30 tablet 6  . azithromycin (ZITHROMAX Z-PAK) 250 MG tablet Take 2 pills today, then 1 pill daily until gone. 6 tablet 0  . CARTIA XT 300 MG 24 hr capsule TAKE 1 CAPSULE(300 MG) BY MOUTH DAILY 30 capsule 5  . cyclobenzaprine (FLEXERIL) 10 MG tablet Take 0.5-1 tablets (5-10 mg total) by mouth 3 (three) times daily as needed for muscle spasms. 30 tablet 1  . fluticasone (FLONASE) 50 MCG/ACT nasal spray Place 2 sprays into both nostrils daily. 16 g 6  . HYDROcodone-acetaminophen (NORCO/VICODIN) 5-325 MG tablet Take 1-2 tablets by mouth every 6 (six) hours as needed for moderate pain. 20 tablet 0  . levothyroxine (SYNTHROID, LEVOTHROID) 150 MCG tablet TAKE 1 TABLET BY MOUTH DAILY 90 tablet 1  . nitroGLYCERIN (NITROSTAT) 0.4 MG SL tablet PLACE 1 TABLET UNDER TONGUE EVERY 5 MINUTES AS NEEDED FOR CHEST PAIN 75 tablet 1  . traMADol (ULTRAM) 50 MG tablet Take 1-2 tablets (50-100 mg total) by mouth every 6 (six)  hours as needed. 20 tablet 0  . warfarin (COUMADIN) 5 MG tablet Take 1.5 tablets (7.5 mg total) by mouth every evening. 30 tablet 1  . [DISCONTINUED] diltiazem (DILACOR XR) 120 MG 24 hr capsule Take 1 capsule (120 mg total) by mouth daily. 30 capsule 6   No current facility-administered medications for this visit.     Review of Systems See HPI     Objective:   Physical Exam BP 130/78 mmHg  Pulse 85  Temp(Src) 98.1 F (36.7 C) (Oral)  Ht '5\' 3"'$  (1.6 m)  Wt 203 lb  (92.08 kg)  BMI 35.97 kg/m2 Gen: NAD, alert, cooperative with exam, well-appearing Back:  Appearance: no gross deformities  Palpation: Tenderness to palpation on the left lumbar just proximal to the iliac crest Flexion: Limited secondary to pain Extension: Limited secondary to pain Gait: Ambulating with a limp Hip:  Appearance: No gross deformities Palpation: No tenderness to palpation of the greater trochanter bilaterally Neuro: Strength hip flexion 5/5, knee extension 5/5, knee flexion 5/5, dorsiflexion 5/5, plantar flexion 5/5 Reflexes: patella 2/2 Bilateral  Achilles 2/2 Bilateral Straight Leg Raise: positive on left Sensation to light touch intact: yes Neurovascularly intact    Assessment & Plan:   Back pain Back pain most likely secondary to muscle strain versus spasm.  Having some symptoms of radiculopathy with possible disc bulging.  No deficits on neurological testing today. - Refilled Norco#20 with the thought in mind that she will only uses for severe pain and use ibuprofen or Tylenol for mild to moderate pain. She was counseled the fact that this is not a long-term medication will not be continued long-term - Refilled Flexeril. Counseled on the fact that this may cause her drowsiness and that she needs to avoid driving prior to taking it. Advised that she take 1 daily at bedtime before titrating up to 3 times a day. - If the pain persists another 4-6 weeks then may need to consider physical therapy versus referral to or so for consideration of epidural injections. We need an MRI prior to epidural injections.

## 2015-10-20 NOTE — Assessment & Plan Note (Signed)
Back pain most likely secondary to muscle strain versus spasm.  Having some symptoms of radiculopathy with possible disc bulging.  No deficits on neurological testing today. - Refilled Norco#20 with the thought in mind that she will only uses for severe pain and use ibuprofen or Tylenol for mild to moderate pain. She was counseled the fact that this is not a long-term medication will not be continued long-term - Refilled Flexeril. Counseled on the fact that this may cause her drowsiness and that she needs to avoid driving prior to taking it. Advised that she take 1 daily at bedtime before titrating up to 3 times a day. - If the pain persists another 4-6 weeks then may need to consider physical therapy versus referral to or so for consideration of epidural injections. We need an MRI prior to epidural injections.

## 2015-10-27 DIAGNOSIS — R310 Gross hematuria: Secondary | ICD-10-CM | POA: Diagnosis not present

## 2015-11-05 ENCOUNTER — Ambulatory Visit (INDEPENDENT_AMBULATORY_CARE_PROVIDER_SITE_OTHER): Payer: Medicare Other | Admitting: Pharmacist Clinician (PhC)/ Clinical Pharmacy Specialist

## 2015-11-05 DIAGNOSIS — I480 Paroxysmal atrial fibrillation: Secondary | ICD-10-CM

## 2015-11-05 DIAGNOSIS — Z7901 Long term (current) use of anticoagulants: Secondary | ICD-10-CM

## 2015-11-05 DIAGNOSIS — I4819 Other persistent atrial fibrillation: Secondary | ICD-10-CM

## 2015-11-05 DIAGNOSIS — I481 Persistent atrial fibrillation: Secondary | ICD-10-CM | POA: Diagnosis not present

## 2015-11-05 LAB — POCT INR: INR: 2.1

## 2015-11-06 DIAGNOSIS — R3129 Other microscopic hematuria: Secondary | ICD-10-CM | POA: Diagnosis not present

## 2015-11-06 DIAGNOSIS — R310 Gross hematuria: Secondary | ICD-10-CM | POA: Diagnosis not present

## 2015-11-09 ENCOUNTER — Other Ambulatory Visit: Payer: Self-pay | Admitting: Family Medicine

## 2015-11-13 DIAGNOSIS — D494 Neoplasm of unspecified behavior of bladder: Secondary | ICD-10-CM | POA: Diagnosis not present

## 2015-11-13 DIAGNOSIS — R310 Gross hematuria: Secondary | ICD-10-CM | POA: Diagnosis not present

## 2015-11-18 ENCOUNTER — Telehealth: Payer: Self-pay | Admitting: Cardiology

## 2015-11-18 NOTE — Telephone Encounter (Signed)
Urology office notified, e-faxed telephone encounter.

## 2015-11-18 NOTE — Telephone Encounter (Signed)
She would be cleared from a medical standpoint for her procedure.  Warfarin per bridging protocol.

## 2015-11-18 NOTE — Telephone Encounter (Signed)
Pt needing preoperative clearance and coumadin instructions.  Clearance request routed to primary cardiologist and to pharmD

## 2015-11-18 NOTE — Telephone Encounter (Signed)
Pt has CHADS2 score of 1 (although listed as "pre-diabetic", could argue a 2).  No need for bridging per protocol.  Ok to hold warfarin x 5 days and restart day after procedure.

## 2015-11-18 NOTE — Telephone Encounter (Signed)
Request for surgical clearance:  1. What type of surgery is being performed? TURBT  2. When is this surgery scheduled? Pending   3. Are there any medications that need to be held prior to surgery and how long? Coumadin(5 days)   4. Name of physician performing surgery? Dr. Thurmon Fair  5. What is your office phone and fax number? Phone- [phone] fax [phone] 6.

## 2015-11-24 ENCOUNTER — Telehealth: Payer: Self-pay | Admitting: Cardiology

## 2015-11-24 NOTE — Telephone Encounter (Signed)
Pt is going to have surgery,does she need appt to be cleared fir surgery?

## 2015-11-24 NOTE — Telephone Encounter (Signed)
Called pt and informed her she has been cleared by Dr. Percival Spanish w/ instructions, aware that documentation was faxed to urology last week.  NFQs.

## 2015-11-25 ENCOUNTER — Other Ambulatory Visit: Payer: Self-pay | Admitting: Urology

## 2015-11-26 ENCOUNTER — Other Ambulatory Visit: Payer: Self-pay | Admitting: *Deleted

## 2015-11-26 ENCOUNTER — Other Ambulatory Visit: Payer: Self-pay | Admitting: Cardiology

## 2015-11-26 ENCOUNTER — Encounter (HOSPITAL_BASED_OUTPATIENT_CLINIC_OR_DEPARTMENT_OTHER): Payer: Self-pay | Admitting: *Deleted

## 2015-11-26 ENCOUNTER — Other Ambulatory Visit: Payer: Self-pay

## 2015-11-26 DIAGNOSIS — E785 Hyperlipidemia, unspecified: Secondary | ICD-10-CM

## 2015-11-26 MED ORDER — DILTIAZEM HCL ER COATED BEADS 300 MG PO CP24: 300.0000 mg | ORAL_CAPSULE | Freq: Every day | ORAL | Status: DC

## 2015-11-27 ENCOUNTER — Encounter (HOSPITAL_BASED_OUTPATIENT_CLINIC_OR_DEPARTMENT_OTHER): Payer: Self-pay | Admitting: *Deleted

## 2015-11-27 MED ORDER — LEVOTHYROXINE SODIUM 150 MCG PO TABS: 150.0000 ug | ORAL_TABLET | Freq: Every day | ORAL | Status: DC

## 2015-11-27 MED ORDER — ATORVASTATIN CALCIUM 80 MG PO TABS: 80.0000 mg | ORAL_TABLET | Freq: Every day | ORAL | Status: DC

## 2015-11-27 MED ORDER — WARFARIN SODIUM 5 MG PO TABS: 7.5000 mg | ORAL_TABLET | Freq: Every evening | ORAL | Status: DC

## 2015-11-27 NOTE — Progress Notes (Signed)
NPO AFTER MN.  ARRIVE AT 0900.  NEEDS PT/INR AND ISTAT.  CURRENT EKG IN CHART AND EPIC .  WILL TAKE SYNTHROID AM DOS W/ SIPS OF WATER.

## 2015-12-01 ENCOUNTER — Ambulatory Visit (HOSPITAL_BASED_OUTPATIENT_CLINIC_OR_DEPARTMENT_OTHER)
Admission: RE | Admit: 2015-12-01 | Discharge: 2015-12-01 | Disposition: A | Discharge status: Home / Self Care | Payer: Medicare Other | Source: Ambulatory Visit | Attending: Urology | Admitting: Urology

## 2015-12-01 ENCOUNTER — Ambulatory Visit (HOSPITAL_BASED_OUTPATIENT_CLINIC_OR_DEPARTMENT_OTHER): Payer: Medicare Other | Admitting: Anesthesiology

## 2015-12-01 ENCOUNTER — Encounter (HOSPITAL_BASED_OUTPATIENT_CLINIC_OR_DEPARTMENT_OTHER)
Admission: RE | Disposition: A | Discharge status: Home / Self Care | Payer: Self-pay | Source: Ambulatory Visit | Attending: Urology

## 2015-12-01 ENCOUNTER — Encounter (HOSPITAL_BASED_OUTPATIENT_CLINIC_OR_DEPARTMENT_OTHER): Payer: Self-pay | Admitting: *Deleted

## 2015-12-01 DIAGNOSIS — I252 Old myocardial infarction: Secondary | ICD-10-CM | POA: Diagnosis not present

## 2015-12-01 DIAGNOSIS — Z79899 Other long term (current) drug therapy: Secondary | ICD-10-CM | POA: Insufficient documentation

## 2015-12-01 DIAGNOSIS — J449 Chronic obstructive pulmonary disease, unspecified: Secondary | ICD-10-CM | POA: Diagnosis not present

## 2015-12-01 DIAGNOSIS — C678 Malignant neoplasm of overlapping sites of bladder: Secondary | ICD-10-CM | POA: Diagnosis not present

## 2015-12-01 DIAGNOSIS — I2510 Atherosclerotic heart disease of native coronary artery without angina pectoris: Secondary | ICD-10-CM | POA: Insufficient documentation

## 2015-12-01 DIAGNOSIS — Z7901 Long term (current) use of anticoagulants: Secondary | ICD-10-CM | POA: Insufficient documentation

## 2015-12-01 DIAGNOSIS — I4891 Unspecified atrial fibrillation: Secondary | ICD-10-CM | POA: Insufficient documentation

## 2015-12-01 DIAGNOSIS — C674 Malignant neoplasm of posterior wall of bladder: Secondary | ICD-10-CM | POA: Insufficient documentation

## 2015-12-01 DIAGNOSIS — F17200 Nicotine dependence, unspecified, uncomplicated: Secondary | ICD-10-CM | POA: Insufficient documentation

## 2015-12-01 DIAGNOSIS — E785 Hyperlipidemia, unspecified: Secondary | ICD-10-CM | POA: Diagnosis not present

## 2015-12-01 DIAGNOSIS — C679 Malignant neoplasm of bladder, unspecified: Secondary | ICD-10-CM | POA: Diagnosis not present

## 2015-12-01 DIAGNOSIS — K219 Gastro-esophageal reflux disease without esophagitis: Secondary | ICD-10-CM | POA: Insufficient documentation

## 2015-12-01 DIAGNOSIS — I10 Essential (primary) hypertension: Secondary | ICD-10-CM | POA: Insufficient documentation

## 2015-12-01 DIAGNOSIS — Z8052 Family history of malignant neoplasm of bladder: Secondary | ICD-10-CM | POA: Diagnosis not present

## 2015-12-01 DIAGNOSIS — M1990 Unspecified osteoarthritis, unspecified site: Secondary | ICD-10-CM | POA: Insufficient documentation

## 2015-12-01 DIAGNOSIS — D494 Neoplasm of unspecified behavior of bladder: Secondary | ICD-10-CM | POA: Diagnosis present

## 2015-12-01 DIAGNOSIS — Z86010 Personal history of colonic polyps: Secondary | ICD-10-CM | POA: Diagnosis not present

## 2015-12-01 DIAGNOSIS — D490 Neoplasm of unspecified behavior of digestive system: Secondary | ICD-10-CM | POA: Diagnosis not present

## 2015-12-01 DIAGNOSIS — Z6835 Body mass index (BMI) 35.0-35.9, adult: Secondary | ICD-10-CM | POA: Insufficient documentation

## 2015-12-01 DIAGNOSIS — E039 Hypothyroidism, unspecified: Secondary | ICD-10-CM | POA: Insufficient documentation

## 2015-12-01 HISTORY — DX: Long term (current) use of anticoagulants: Z79.01

## 2015-12-01 HISTORY — DX: Complete loss of teeth, unspecified cause, unspecified class: K08.109

## 2015-12-01 HISTORY — DX: Personal history of colonic polyps: Z86.010

## 2015-12-01 HISTORY — DX: Stress incontinence (female) (male): N39.3

## 2015-12-01 HISTORY — DX: Presence of spectacles and contact lenses: Z97.3

## 2015-12-01 HISTORY — DX: Neoplasm of unspecified behavior of bladder: D49.4

## 2015-12-01 HISTORY — DX: Complete loss of teeth, unspecified cause, unspecified class: Z97.2

## 2015-12-01 HISTORY — PX: TRANSURETHRAL RESECTION OF BLADDER TUMOR: SHX2575

## 2015-12-01 HISTORY — DX: Other persistent atrial fibrillation: I48.19

## 2015-12-01 HISTORY — DX: Personal history of colon polyps, unspecified: Z86.0100

## 2015-12-01 HISTORY — DX: Gastro-esophageal reflux disease without esophagitis: K21.9

## 2015-12-01 HISTORY — DX: Old myocardial infarction: I25.2

## 2015-12-01 LAB — PROTIME-INR
INR: 1.1 (ref 0.00–1.49)
Prothrombin Time: 14.4 seconds (ref 11.6–15.2)

## 2015-12-01 LAB — POCT I-STAT 4, (NA,K, GLUC, HGB,HCT)
Glucose, Bld: 153 mg/dL — ABNORMAL HIGH (ref 65–99)
HCT: 40 % (ref 36.0–46.0)
Hemoglobin: 13.6 g/dL (ref 12.0–15.0)
Potassium: 4 mmol/L (ref 3.5–5.1)
Sodium: 140 mmol/L (ref 135–145)

## 2015-12-01 SURGERY — TURBT (TRANSURETHRAL RESECTION OF BLADDER TUMOR)
Anesthesia: General

## 2015-12-01 MED ORDER — HYDROCODONE-ACETAMINOPHEN 7.5-325 MG PO TABS
1.0000 | ORAL_TABLET | Freq: Once | ORAL | Status: DC | PRN
  Filled 2015-12-01: qty 1

## 2015-12-01 MED ORDER — SODIUM CHLORIDE 0.9 % IR SOLN
Status: DC | PRN
  Administered 2015-12-01: 3000 mL via INTRAVESICAL
  Administered 2015-12-01: 6000 mL via INTRAVESICAL

## 2015-12-01 MED ORDER — PROPOFOL 10 MG/ML IV BOLUS
INTRAVENOUS | Status: DC | PRN
  Administered 2015-12-01: 160 mg via INTRAVENOUS

## 2015-12-01 MED ORDER — OXYCODONE HCL 10 MG PO TABS: 10.0000 mg | ORAL_TABLET | ORAL | Status: DC | PRN

## 2015-12-01 MED ORDER — PHENYLEPHRINE 40 MCG/ML (10ML) SYRINGE FOR IV PUSH (FOR BLOOD PRESSURE SUPPORT)
PREFILLED_SYRINGE | INTRAVENOUS | Status: AC
  Filled 2015-12-01: qty 10

## 2015-12-01 MED ORDER — DEXAMETHASONE SODIUM PHOSPHATE 4 MG/ML IJ SOLN
INTRAMUSCULAR | Status: DC | PRN
  Administered 2015-12-01: 8 mg via INTRAVENOUS

## 2015-12-01 MED ORDER — PHENAZOPYRIDINE HCL 100 MG PO TABS
ORAL_TABLET | ORAL | Status: AC
  Filled 2015-12-01: qty 2

## 2015-12-01 MED ORDER — MIDAZOLAM HCL 5 MG/5ML IJ SOLN
INTRAMUSCULAR | Status: DC | PRN
  Administered 2015-12-01: 1 mg via INTRAVENOUS

## 2015-12-01 MED ORDER — MITOMYCIN CHEMO FOR BLADDER INSTILLATION 40 MG: 40.0000 mg | Freq: Once | INTRAVENOUS | Status: DC

## 2015-12-01 MED ORDER — PHENYLEPHRINE HCL 10 MG/ML IJ SOLN
INTRAMUSCULAR | Status: DC | PRN
  Administered 2015-12-01 (×5): 40 ug via INTRAVENOUS

## 2015-12-01 MED ORDER — PHENAZOPYRIDINE HCL 200 MG PO TABS
200.0000 mg | ORAL_TABLET | Freq: Once | ORAL | Status: AC
  Administered 2015-12-01: 200 mg via ORAL
  Filled 2015-12-01: qty 1

## 2015-12-01 MED ORDER — PROPOFOL 10 MG/ML IV BOLUS
INTRAVENOUS | Status: AC
  Filled 2015-12-01: qty 20

## 2015-12-01 MED ORDER — LIDOCAINE HCL (CARDIAC) 20 MG/ML IV SOLN
INTRAVENOUS | Status: DC | PRN
  Administered 2015-12-01: 60 mg via INTRAVENOUS

## 2015-12-01 MED ORDER — FENTANYL CITRATE (PF) 100 MCG/2ML IJ SOLN
25.0000 ug | INTRAMUSCULAR | Status: DC | PRN
  Filled 2015-12-01: qty 1

## 2015-12-01 MED ORDER — STERILE WATER FOR IRRIGATION IR SOLN
Status: DC | PRN
  Administered 2015-12-01: 500 mL

## 2015-12-01 MED ORDER — CIPROFLOXACIN IN D5W 200 MG/100ML IV SOLN
INTRAVENOUS | Status: AC
  Filled 2015-12-01: qty 100

## 2015-12-01 MED ORDER — FENTANYL CITRATE (PF) 100 MCG/2ML IJ SOLN
INTRAMUSCULAR | Status: AC
  Filled 2015-12-01: qty 2

## 2015-12-01 MED ORDER — LACTATED RINGERS IV SOLN
INTRAVENOUS | Status: DC
  Administered 2015-12-01 (×2): via INTRAVENOUS
  Filled 2015-12-01: qty 1000

## 2015-12-01 MED ORDER — MITOMYCIN CHEMO FOR BLADDER INSTILLATION 40 MG
40.0000 mg | Freq: Once | INTRAVENOUS | Status: AC
  Administered 2015-12-01: 40 mg via INTRAVESICAL
  Filled 2015-12-01: qty 40

## 2015-12-01 MED ORDER — ONDANSETRON HCL 4 MG/2ML IJ SOLN
INTRAMUSCULAR | Status: DC | PRN
  Administered 2015-12-01: 4 mg via INTRAVENOUS

## 2015-12-01 MED ORDER — CIPROFLOXACIN IN D5W 200 MG/100ML IV SOLN
200.0000 mg | INTRAVENOUS | Status: AC
  Administered 2015-12-01: 200 mg via INTRAVENOUS
  Filled 2015-12-01: qty 100

## 2015-12-01 MED ORDER — MIDAZOLAM HCL 2 MG/2ML IJ SOLN
INTRAMUSCULAR | Status: AC
  Filled 2015-12-01: qty 2

## 2015-12-01 MED ORDER — OXYBUTYNIN CHLORIDE 5 MG PO TABS
ORAL_TABLET | ORAL | Status: AC
  Filled 2015-12-01: qty 1

## 2015-12-01 MED ORDER — OXYBUTYNIN CHLORIDE 5 MG PO TABS
5.0000 mg | ORAL_TABLET | Freq: Once | ORAL | Status: AC
  Administered 2015-12-01: 5 mg via ORAL
  Filled 2015-12-01: qty 1

## 2015-12-01 MED ORDER — ONDANSETRON HCL 4 MG/2ML IJ SOLN
INTRAMUSCULAR | Status: AC
  Filled 2015-12-01: qty 2

## 2015-12-01 MED ORDER — LIDOCAINE HCL (CARDIAC) 20 MG/ML IV SOLN
INTRAVENOUS | Status: AC
  Filled 2015-12-01: qty 5

## 2015-12-01 MED ORDER — PHENAZOPYRIDINE HCL 200 MG PO TABS: 200.0000 mg | ORAL_TABLET | Freq: Three times a day (TID) | ORAL | Status: DC | PRN

## 2015-12-01 MED ORDER — HYDROCODONE-ACETAMINOPHEN 10-325 MG PO TABS: 1.0000 | ORAL_TABLET | ORAL | Status: DC | PRN

## 2015-12-01 MED ORDER — FENTANYL CITRATE (PF) 100 MCG/2ML IJ SOLN
INTRAMUSCULAR | Status: DC | PRN
  Administered 2015-12-01: 25 ug via INTRAVENOUS
  Administered 2015-12-01: 50 ug via INTRAVENOUS
  Administered 2015-12-01: 25 ug via INTRAVENOUS

## 2015-12-01 MED ORDER — DEXAMETHASONE SODIUM PHOSPHATE 10 MG/ML IJ SOLN
INTRAMUSCULAR | Status: AC
  Filled 2015-12-01: qty 1

## 2015-12-01 SURGICAL SUPPLY — 35 items
BAG DRAIN URO-CYSTO SKYTR STRL (DRAIN) ×2 IMPLANT
BAG DRN ANRFLXCHMBR STRAP LEK (BAG)
BAG DRN UROCATH (DRAIN) ×1
BAG URINE DRAINAGE (UROLOGICAL SUPPLIES) ×1 IMPLANT
BAG URINE LEG 19OZ MD ST LTX (BAG) IMPLANT
CANISTER SUCT LVC 12 LTR MEDI- (MISCELLANEOUS) IMPLANT
CATH FOLEY 2WAY SLVR  5CC 20FR (CATHETERS) ×1
CATH FOLEY 2WAY SLVR  5CC 22FR (CATHETERS)
CATH FOLEY 2WAY SLVR  5CC 24FR (CATHETERS) ×1
CATH FOLEY 2WAY SLVR 5CC 20FR (CATHETERS) IMPLANT
CATH FOLEY 2WAY SLVR 5CC 22FR (CATHETERS) IMPLANT
CATH FOLEY 2WAY SLVR 5CC 24FR (CATHETERS) ×1 IMPLANT
CATH FOLEY 3WAY 20FR (CATHETERS) IMPLANT
CLOTH BEACON ORANGE TIMEOUT ST (SAFETY) ×2 IMPLANT
ELECT BIVAP BIPO 22/24 DONUT (ELECTROSURGICAL) ×2
ELECT LOOP MED HF 24F 12D (CUTTING LOOP) IMPLANT
ELECT REM PT RETURN 9FT ADLT (ELECTROSURGICAL) ×2
ELECTRD BIVAP BIPO 22/24 DONUT (ELECTROSURGICAL) ×1 IMPLANT
ELECTRODE REM PT RTRN 9FT ADLT (ELECTROSURGICAL) ×1 IMPLANT
EVACUATOR MICROVAS BLADDER (UROLOGICAL SUPPLIES) IMPLANT
GLOVE BIO SURGEON STRL SZ8 (GLOVE) ×2 IMPLANT
GOWN STRL REUS W/ TWL LRG LVL3 (GOWN DISPOSABLE) ×1 IMPLANT
GOWN STRL REUS W/ TWL XL LVL3 (GOWN DISPOSABLE) ×1 IMPLANT
GOWN STRL REUS W/TWL LRG LVL3 (GOWN DISPOSABLE) ×2
GOWN STRL REUS W/TWL XL LVL3 (GOWN DISPOSABLE) ×2
HOLDER FOLEY CATH W/STRAP (MISCELLANEOUS) IMPLANT
IV NS IRRIG 3000ML ARTHROMATIC (IV SOLUTION) IMPLANT
KIT ROOM TURNOVER WOR (KITS) ×2 IMPLANT
LOOP CUT BIPOLAR 24F LRG (ELECTROSURGICAL) ×1 IMPLANT
MANIFOLD NEPTUNE II (INSTRUMENTS) IMPLANT
PACK CYSTO (CUSTOM PROCEDURE TRAY) ×2 IMPLANT
PLUG CATH AND CAP STER (CATHETERS) IMPLANT
SET ASPIRATION TUBING (TUBING) ×1 IMPLANT
TUBE CONNECTING 12X1/4 (SUCTIONS) IMPLANT
WATER STERILE IRR 3000ML UROMA (IV SOLUTION) IMPLANT

## 2015-12-01 NOTE — Op Note (Signed)
PATIENT:  [name]  PRE-OPERATIVE DIAGNOSIS: Bladder tumor  POST-OPERATIVE DIAGNOSIS: Same  PROCEDURE:  Procedure(s): 1. TRANSURETHRAL RESECTION OF BLADDER TUMOR (TURBT) (3cm.) 2. Instillation of intravesical chemotherapy (mitomycin-C)  SURGEON:  Surgeon(s): Claybon Jabs  ANESTHESIA:   General  EBL:  Minimal  DRAINS: Urethral catheter (20 Fr. Foley)   SPECIMEN: 1. Bladder tumor  2. Biopsy of base of bladder tumor.  DISPOSITION OF SPECIMEN:  PATHOLOGY  Indication:  [name] is a 64 year old female with a long history of cigarette smoking on Coumadin who developed gross hematuria. Upper tract evaluation with a CT scan revealed no abnormality of the upper tract to account for her hematuria however she was found cystoscopically to have a papillary tumor involving the right posterior wall of her bladder. She is brought to the operating room today for resection of her bladder tumor and postoperative mitomycin-C.  Description of operation: The patient was taken to the operating room and administered general anesthesia. They were then placed on the table and moved to the dorsal lithotomy position after which the genitalia was sterilely prepped and draped. An official timeout was then performed.  The urethra was dilated with female sounds from 22 up to 31 [place]. The 56 French resectoscope with the 30 lens and visual obturator were then passed into the bladder under direct visualization. Urethra appeared normal. The visual obturator was then removed and the Gyrus resectoscope element with 30  lens was then inserted and the bladder was fully and systematically inspected. Ureteral orifices were noted to be in the normal anatomic positions. There was an area of papillary tumor located on the right posterior wall extending down on to the floor the bladder and encompassing an area of 3 cm at its greatest dimension. No other lesions were identified within the bladder.  I first began by  resecting bladder tumor from the wall of the bladder. I was able to fully resect the tumor.  I then fulgurated bleeding points and then obtained tissue from the base of the bladder tumor and sent this separately.   Reinspection of the bladder revealed all obvious tumor had been fully resected and there was no evidence of perforation. The Microvasive evacuator had been  used to irrigate the bladder and remove all of the portions of bladder tumor which were sent to pathology. I then removed the resectoscope.  A 20 French Foley catheter was then inserted in the bladder and irrigated. The irrigant returned clear. The patient was awakened and taken to the recovery room.  While in the recovery room 40 mg of mitomycin-C in 40 cc of water were instilled in the bladder through the catheter and the catheter was plugged. This will remain indwelling for approximately one hour. It will then be drained from the bladder and the catheter will be removed and the patient discharged home.  PLAN OF CARE: Discharge to home after PACU  PATIENT DISPOSITION:  PACU - hemodynamically stable.

## 2015-12-01 NOTE — Anesthesia Procedure Notes (Signed)
Procedure Name: LMA Insertion Date/Time: 12/01/2015 11:10 AM Performed by: Mechele Claude Pre-anesthesia Checklist: Patient identified, Emergency Drugs available, Suction available and Patient being monitored Patient Re-evaluated:Patient Re-evaluated prior to inductionOxygen Delivery Method: Circle System Utilized Preoxygenation: Pre-oxygenation with 100% oxygen Intubation Type: IV induction Ventilation: Mask ventilation without difficulty LMA: LMA inserted LMA Size: 4.0 Number of attempts: 1 Airway Equipment and Method: bite block Placement Confirmation: positive ETCO2 Tube secured with: Tape Dental Injury: Teeth and Oropharynx as per pre-operative assessment

## 2015-12-01 NOTE — Discharge Instructions (Addendum)
Post ureteroscopy  Definitions:  Ureter: The duct that transports urine from the kidney to the bladder. General instructions:  Despite the fact that no skin incisions were used, the area around the ureter and bladder is raw and irritated. The stent is a foreign body which can further irritate the bladder wall. This irritation is manifested by increased frequency of urination, both day and night, and by an increase in the urge to urinate. In some, the urge to urinate is present almost always. Sometimes the urge is strong enough that you may not be able to stop your self from urinating. This can often be controlled with medication but does not occur in everyone. A stent can safely be left in place for 3 months or greater.  You may see some blood in your urine . Do not be alarmed, even if the urine is clear for a while. Get off your feet and drink lots of fluids until clearing occurs. If you start to pass clots or don't improve, call us.  Diet:  You may return to your normal diet immediately. Because of the raw surface of your bladder, alcohol, spicy foods, foods high in acid and drinks with caffeine may cause irritation or frequency and should be used in moderation. To keep your urine flowing freely and avoid constipation, drink plenty of fluids during the day (8-10 glasses). Tip: Avoid cranberry juice because it is very acidic.  Activity:  Your physical activity doesn't need to be restricted. However, if you are very active, you may see some blood in the urine. We suggest that you reduce your activity under the circumstances until the bleeding has stopped.  Bowels:  It is important to keep your bowels regular during the postoperative period. Straining with bowel movements can cause bleeding. A bowel movement every other day is reasonable. Use a mild laxative if needed, such as milk of magnesia 2-3 tablespoons, or 2 Dulcolax tablets. Call if you continue to have problems. If you had been taking  narcotics for pain, before, during or after your surgery, you may be constipated. Take a laxative if necessary.  Medication:  You should resume your pre-surgery medications unless told not to. In addition you may be given an antibiotic to prevent or treat infection. Antibiotics are not always necessary. All medication should be taken as prescribed until the bottles are finished unless you are having an unusual reaction to one of the drugs. You may restart Coumadin in 24 hours as long as the urine remains clear or nearly clear.  Problems you should report to [place]:  a. Fever greater than 101F. b. Heavy bleeding, or clots (see notes above about blood in urine). c. Inability to urinate. d. Drug reactions (hives, rash, nausea, vomiting, diarrhea). e. Severe burning or pain with urination that is not improving.    Post Anesthesia Home Care Instructions  Activity: Get plenty of rest for the remainder of the day. A responsible adult should stay with you for 24 hours following the procedure.  For the next 24 hours, DO NOT: -Drive a car -Paediatric nurse -Drink alcoholic beverages -Take any medication unless instructed by your physician -Make any legal decisions or sign important papers.  Meals: Start with liquid foods such as gelatin or soup. Progress to regular foods as tolerated. Avoid greasy, spicy, heavy foods. If nausea and/or vomiting occur, drink only clear liquids until the nausea and/or vomiting subsides. Call your physician if vomiting continues.  Special Instructions/Symptoms: Your throat may feel dry or sore from  the anesthesia or the breathing tube placed in your throat during surgery. If this causes discomfort, gargle with warm salt water. The discomfort should disappear within 24 hours.

## 2015-12-01 NOTE — H&P (Signed)
[name] is a 63 year old female with a newly diagnosed bladder tumor   History of Present Illness Gross hematuria: She did experience intermittent gross hematuria for a period of approximately one year. She has, as risk factors, a 22.5-pack-year smoking history and a brother who had bladder cancer.    Interval history: She has not seen any further gross hematuria and has no new urologic complaints.    She takes Coumadin for atrial fibrillation.     Past Medical History Problems  1. 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 History of acute myocardial infarction (I25.2) 3. History of atrial fibrillation (Z86.79) 4. History of colonic polyps (Z86.010) 5. History of coronary atherosclerosis (Z86.79) 6. History of depression (Z86.59) 7. History of hyperlipidemia (Z86.39) 8. History of hypertension (Z86.79) 9. History of hypothyroidism (Z86.39) 10. History of osteoarthritis (Z87.39) 11. History of sinus bradycardia (Z86.79) 12. History of Prediabetes (R73.03)  Surgical History Problems  1. History of Hip Surgery 2. History of Tubal Ligation  Current Meds 1. DiltiaZEM CD 300 MG Oral Capsule Extended Release 24 Hour;  Therapy: (Recorded:31Oct2016) to Recorded 2. Levothyroxine Sodium 150 MCG Oral Tablet;  Therapy: (Recorded:31Oct2016) to Recorded 3. Lipitor 80 MG Oral Tablet;  Therapy: (Recorded:31Oct2016) to Recorded 4. Warfarin Sodium 5 MG Oral Tablet;  Therapy: (Recorded:31Oct2016) to Recorded  Allergies Medication  1. Penicillins 2. Percocet TABS  Social History Problems  1. Denied: History of Alcohol use 2. Denied: History of Caffeine use 3. Current smoker (F17.200)   2ppd 4. Number of children   4 daughters  and 1 son 5. Retired   Engineer, site Vital Signs  Blood Pressure: 135 / 67 Heart Rate: 96  Review of Systems Genitourinary, constitutional, skin, eye, otolaryngeal, hematologic/lymphatic, cardiovascular, pulmonary, endocrine, musculoskeletal,  gastrointestinal, neurological and psychiatric system(s) were reviewed and pertinent findings if present are noted and are otherwise negative.  Genitourinary: urinary urgency, nocturia, incontinence, hematuria and dyspareunia.  Gastrointestinal: constipation.  Constitutional: feeling tired (fatigue).  Eyes: blurred vision.  ENT: sinus problems.  Hematologic/Lymphatic: a tendency to easily bruise.  Cardiovascular: chest pain and leg swelling.  Respiratory: shortness of breath.  Musculoskeletal: back pain and joint pain.  Neurological: dizziness.    Physical Exam Constitutional: Well nourished and well developed . No acute distress.   ENT:. The ears and nose are normal in appearance.   Neck: The appearance of the neck is normal and no neck mass is present.   Pulmonary: No respiratory distress and normal respiratory rhythm and effort.   Cardiovascular: Heart rate and rhythm are normal . No peripheral edema.   Abdomen: The abdomen is mildly obese. The abdomen is soft and nontender. No masses are palpated. No CVA tenderness. No hernias are palpable. No hepatosplenomegaly noted.   Lymphatics: The femoral and inguinal nodes are not enlarged or tender.   Skin: Normal skin turgor, no visible rash and no visible skin lesions.   Neuro/Psych:. Mood and affect are appropriate.   Results/Data  The following images/tracing/specimen were independently visualized:  CT scan as below.  The following clinical lab reports were reviewed:  UA:.  The following radiology reports were reviewed: CT scan. Selected Results  AU CT-HEMATURIA PROTOCOL [id number] 12:00AM Kathie Rhodes  Test Name Result Flag Reference AU CT-HEMATURIA PROTOCOL (Report)   ** RADIOLOGY REPORT BY  [facility] **   CLINICAL DATA: Microhematuria and gross hematuria starting 1 year ago, occurring intermittently.  EXAM: CT ABDOMEN AND PELVIS WITHOUT AND WITH CONTRAST  TECHNIQUE: Multidetector CT imaging of the  abdomen and  pelvis was performed following the standard protocol before and following the bolus administration of intravenous contrast.  CONTRAST: 125 cc Isovue 300  COMPARISON: None.  FINDINGS: Lower chest: Dependent subsegmental atelectasis in the lower lobes. Coronary atherosclerosis. Mild cardiomegaly. Small type 1 hiatal hernia.  Hepatobiliary: Unremarkable  Pancreas: Unremarkable  Spleen: Unremarkable  Adrenals/Urinary Tract: Adrenal glands unremarkable. Renal parenchyma normal.  2.1 by 1.2 by 2.0 cm somewhat irregular mass of the right posterolateral urinary bladder wall shown on image 67 series 900 and image 81 series 8. Partially obscured by streak artifact from the left hip implant. This mass is observed to enhance on the portal venous phase images (image 79, series 4).  Stomach/Bowel: Unremarkable  Vascular/Lymphatic: Aortoiliac atherosclerotic vascular disease. 8 mm right external iliac node, image 76 series 4. No pathologic adenopathy identified.  Reproductive: Unremarkable  Other: Faint stranding in the central mesentery without mesenteric adenopathy.  Musculoskeletal: Left total hip prosthesis. 1.9 by 2.0 cm lucency above the acetabular shell portion in the acetabular roof, probably from particulate disease. No compelling findings of metastatic disease.  Grade 1 anterolisthesis at L4-5 with fused L4-5 facet joints. There is probably some resulting central narrowing of the thecal sac at this level.  IMPRESSION: 1. Irregular sessile mass the right posterior urinary bladder, compatible with transitional cell carcinoma. No other deposits of tumor are identified. No adenopathy observed. 2. Low grade stranding in the central mesentery of the upper abdomen, possibly from a low-level inflammatory process or remote inflammation. 3. Other imaging findings of potential clinical significance: Coronary atherosclerosis with mild cardiomegaly. Small type 1  hiatal hernia. Aortoiliac atherosclerotic vascular disease. Probable particulate disease causing lucency in the left acetabular roof along the acetabular shell. Probable impingement at L4-5 with grade 1 anterolisthesis and fused facet joints. These results will be called to the ordering clinician or representative by the Radiologist Assistant, and communication documented in the PACS or zVision Dashboard.   Electronically Signed  By: Van Clines M.D.  On: 11/06/2015 11:51  Procedure  Procedure: Cystoscopy performed on 11/13/15 Chaperone Present: Sonia.  Indication: Hematuria. Bladder Mass.  Informed Consent: Risks, benefits, and potential adverse events were discussed and informed consent was obtained from the patient.  Prep: The patient was prepped with hibiclens.  Procedure Note:  Urethral meatus:. No abnormalities.  Anterior urethra: No abnormalities.  Bladder: Visulization was clear. The ureteral orifices were in the normal anatomic position bilaterally and had clear efflux of urine. A solitary tumor was visualized in the bladder. A papillary tumor was seen in the bladder. This tumor was located on the right side, on the posterior aspect, at the base of the bladder. The patient tolerated the procedure well.  Complications: None.    Assessment   I went over the results of the CT scan as well as my cystoscopic findings today which have revealed a bladder mass consistent with transitional cell carcinoma. It is located primarily on the right posterior wall that extends down onto the floor of the bladder but does not involve the ureteral orifice on the right hand side.    We discussed the fact that currently there is no evidence of extravesical extension or pelvic adenopathy based on CT scan findings. Further characterization of the lesion is required for grading and staging purposes. We discussed proceeding with evaluation using transurethral resection of the lesion. I have  discussed the procedure in detail as well as the potential risks and complications associated with this form of surgery. We also discussed the probability of  successful resection of the intravesical portion of this lesion. I have recommended, as long as there is no contraindication at the time of surgery, the placement of intravesical mitomycin-C in order to reduce the risk of recurrence. We did discuss the potential side effects of this form of intravesical chemotherapy. The procedure will be performed under anesthesia as an outpatient.   Plan  1. She will need to come off of her Coumadin 5 days prior to surgery.  2. She will be scheduled for TURBT and postoperative intravesical mitomycin-C.

## 2015-12-01 NOTE — Transfer of Care (Signed)
Last Vitals:  Filed Vitals:   12/01/15 0922  BP: 125/67  Pulse: 83  Temp: 36.9 C  Resp: 12   Immediate Anesthesia Transfer of Care Note  Patient: [name]  Procedure(s) Performed: Procedure(s) (LRB): TRANSURETHRAL RESECTION OF BLADDER TUMOR (TURBT) (N/A)  Patient Location: PACU  Anesthesia Type: General  Level of Consciousness: awake, alert  and oriented  Airway & Oxygen Therapy: Patient Spontanous Breathing and Patient connected to face mask oxygen  Post-op Assessment: Report given to PACU RN and Post -op Vital signs reviewed and stable  Post vital signs: Reviewed and stable  Complications: No apparent anesthesia complications

## 2015-12-01 NOTE — Anesthesia Preprocedure Evaluation (Signed)
Anesthesia Evaluation  Patient identified by MRN, date of birth, ID band Patient awake    Reviewed: Allergy & Precautions, NPO status , Patient's Chart, lab work & pertinent test results  History of Anesthesia Complications Negative for: history of anesthetic complications  Airway Mallampati: II  TM Distance: >3 FB Neck ROM: Full    Dental  (+) Edentulous Upper, Edentulous Lower, Upper Dentures, Lower Dentures   Pulmonary neg shortness of breath, neg sleep apnea, neg COPD, neg recent URI, former smoker, neg PE   breath sounds clear to auscultation       Cardiovascular hypertension, Pt. on medications (-) angina+ CAD  (-) Cardiac Stents and (-) CHF + dysrhythmias Atrial Fibrillation  Rhythm:Irregular     Neuro/Psych PSYCHIATRIC DISORDERS Depression negative neurological ROS     GI/Hepatic Neg liver ROS, GERD  Controlled,  Endo/Other  Hypothyroidism Morbid obesity  Renal/GU negative Renal ROS     Musculoskeletal  (+) Arthritis ,   Abdominal   Peds  Hematology negative hematology ROS (+)   Anesthesia Other Findings   Reproductive/Obstetrics                             Anesthesia Physical Anesthesia Plan  ASA: III  Anesthesia Plan: General   Post-op Pain Management:    Induction: Intravenous  Airway Management Planned: LMA  Additional Equipment: None  Intra-op Plan:   Post-operative Plan: Extubation in OR  Informed Consent: I have reviewed the patients History and Physical, chart, labs and discussed the procedure including the risks, benefits and alternatives for the proposed anesthesia with the patient or authorized representative who has indicated his/her understanding and acceptance.     Plan Discussed with: CRNA and Surgeon  Anesthesia Plan Comments:         Anesthesia Quick Evaluation

## 2015-12-02 ENCOUNTER — Encounter (HOSPITAL_BASED_OUTPATIENT_CLINIC_OR_DEPARTMENT_OTHER): Payer: Self-pay | Admitting: Urology

## 2015-12-02 NOTE — Anesthesia Postprocedure Evaluation (Signed)
Anesthesia Post Note  Patient: [name]  Procedure(s) Performed: Procedure(s) (LRB): TRANSURETHRAL RESECTION OF BLADDER TUMOR (TURBT) (N/A)  Patient location during evaluation: PACU Anesthesia Type: General Level of consciousness: awake Pain management: pain level controlled Vital Signs Assessment: post-procedure vital signs reviewed and stable Respiratory status: spontaneous breathing Cardiovascular status: stable Postop Assessment: no signs of nausea or vomiting Anesthetic complications: no    Last Vitals:  Filed Vitals:   12/01/15 1330 12/01/15 1410  BP: 129/89 122/60  Pulse: 85 66  Temp:  36.6 C  Resp: 17 18    Last Pain: There were no vitals filed for this visit.               Rosalie Buenaventura

## 2015-12-03 ENCOUNTER — Ambulatory Visit: Payer: Medicare Other | Admitting: Pharmacist Clinician (PhC)/ Clinical Pharmacy Specialist

## 2015-12-09 DIAGNOSIS — C678 Malignant neoplasm of overlapping sites of bladder: Secondary | ICD-10-CM | POA: Diagnosis not present

## 2015-12-09 DIAGNOSIS — N390 Urinary tract infection, site not specified: Secondary | ICD-10-CM | POA: Diagnosis not present

## 2015-12-11 ENCOUNTER — Other Ambulatory Visit: Payer: Self-pay | Admitting: Cardiology

## 2015-12-11 MED ORDER — DILTIAZEM HCL ER COATED BEADS 300 MG PO CP24: 300.0000 mg | ORAL_CAPSULE | Freq: Every day | ORAL | Status: DC

## 2015-12-19 ENCOUNTER — Other Ambulatory Visit: Payer: Self-pay | Admitting: Internal Medicine

## 2015-12-23 ENCOUNTER — Other Ambulatory Visit: Payer: Self-pay | Admitting: Family Medicine

## 2015-12-26 ENCOUNTER — Encounter: Payer: Self-pay | Admitting: Student

## 2015-12-26 ENCOUNTER — Ambulatory Visit (INDEPENDENT_AMBULATORY_CARE_PROVIDER_SITE_OTHER): Payer: Medicare Other | Admitting: Student

## 2015-12-26 VITALS — BP 119/65 | HR 89 | Temp 94.0°F | Wt 198.4 lb

## 2015-12-26 DIAGNOSIS — J069 Acute upper respiratory infection, unspecified: Secondary | ICD-10-CM

## 2015-12-26 DIAGNOSIS — H6123 Impacted cerumen, bilateral: Secondary | ICD-10-CM | POA: Diagnosis not present

## 2015-12-26 DIAGNOSIS — H918X3 Other specified hearing loss, bilateral: Secondary | ICD-10-CM

## 2015-12-26 MED ORDER — CARBAMIDE PEROXIDE 6.5 % OT SOLN: 5.0000 [drp] | Freq: Two times a day (BID) | OTIC | Status: DC

## 2015-12-26 NOTE — Patient Instructions (Signed)
Follow up on 1/3 for ear clean out You were prescribed ear drops to help to loosen the wax in your ears. Please use this twice daily in both ears until your appointment You were also found to have cold. Please try soups and teas as needed to help with symptoms. You may also try over the counter cold remedies like Alka Selzer cold and flu If you have questions or concerns, please call teh office at [phone]

## 2015-12-27 DIAGNOSIS — H6120 Impacted cerumen, unspecified ear: Secondary | ICD-10-CM | POA: Insufficient documentation

## 2015-12-27 DIAGNOSIS — J069 Acute upper respiratory infection, unspecified: Secondary | ICD-10-CM | POA: Insufficient documentation

## 2015-12-27 NOTE — Assessment & Plan Note (Signed)
History and physical exam consistent with URI - Will continue conservative measures  - She will try over the counter cold medicines for symtom relief as needed - return precautions given

## 2015-12-27 NOTE — Progress Notes (Signed)
Subjective:    Patient ID: [name], female    DOB: [birth year], 64 y.o.   MRN: [id number]   CC:  Cough, congestion, decreased hearing  HPI 64 y/o F presenting for congestion, cough and decreased hearing  Cough and congestion - Started 4 days ago - has not had fever, sore throat, or ear pain associated - no N/V/D - she has tried mucinex with little improvement  Decreased hearing - started about 1 week ago - this has happened to her once before and she was found to have heavy build up of ear wax requiring clean out - denies ear pain or tenderness  Review of Systems   See HPI for ROS.   Past Medical History  Diagnosis Date  . Hypertension   . HLD (hyperlipidemia)   . Hypothyroidism   . CAD (coronary artery disease) cardiologist-  dr hochrien    a. PTCA to RCA (02/ 1999);  b.  Myoview 5/10:  EF 71%, no ischemia  . History of MI (myocardial infarction)     1999  . Atrial fibrillation, persistent ([place])   . Anticoagulated on Coumadin   . History of colon polyps     2005  hyperplastic  . Bladder tumor   . Wears glasses   . Full dentures   . Arthritis     wrist, hands -  . Prediabetes   . GERD (gastroesophageal reflux disease)   . SUI (stress urinary incontinence, female)   . Wears glasses    Past Surgical History  Procedure Laterality Date  . Tubal ligation  1974  . Hysteroscopy w/d&c  12/07/2012    Procedure: DILATATION AND CURETTAGE /HYSTEROSCOPY;  Surgeon: Elveria Royals, MD;  Location: [facility];  Service: Gynecology;  Laterality: N/A;  Dilitation and curettage /hysteroscopy/biopsey of lower segment of uterus  . D & c hysteroscopy/  polypectomy  05-08-2009  . Total hip arthroplasty Left 12-11-2010  . Coronary angioplasty  Feb 1999    PTCA to RCA  . Cardiovascular stress test  05-01-2009   dr hochrein    normal nuclear study/  no ischemia or scar/  normal LV function and wall motion, ef 71%  . Transthoracic echocardiogram  09-11-2015    mild LVH, ef 55%,   mildly reduced RVSF,  trivial TR  . Transurethral resection of bladder tumor N/A 12/01/2015    Procedure: TRANSURETHRAL RESECTION OF BLADDER TUMOR (TURBT);  Surgeon: Kathie Rhodes, MD;  Location: [facility];  Service: Urology;  Laterality: N/A;   OB History    No data available     Social History   Social History  . Marital Status: Married    Spouse Name: Eddie Dibbles  . Number of Children: 5  . Years of Education: 12   Occupational History  . works at a Fore[employer]    Social History Main Topics  . Smoking status: Former Smoker -- 1.50 packs/day for 15 years    Types: Cigarettes    Quit date: 11/09/1981  . Smokeless tobacco: Never Used  . Alcohol Use: Yes     Comment: occasional  . Drug Use: No  . Sexual Activity: Not on file   Other Topics Concern  . 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  Has 5 adult children all living in the area.      Health Care POA:    Emergency Contact: husband, Ibeth Fahmy, [phone]  End of Life Plan: discussed AD, gave pt pamphlet   Who lives with you: husband, two sons, granddaughter, and great-grandson.    Any pets: dalamation- Kisses   Diet: Pt has a varied diet of protein, starch and vegetables.   Exercise: Pt has no regular exercise routine.  Does wear fitbit and tracks steps, reports walking 12-13k steps a day when she is working.   Seatbelts: Pt reports wearing seatbelt when in vehicles.    Hobbies: cooking          Objective:  BP 119/65 mmHg  Pulse 89  Temp(Src) 94 F (34.4 C) (Oral)  Wt 198 lb 6.4 oz (89.994 kg)  SpO2 95% Vitals and nursing note reviewed  General: NAD HEENT: extensive cerumen bilaterally blocking view of the TMs, no pain with traction on bilateral pinna, normal oropharynx with no lesions or erythema, no tenderness to palpation of the maxilary or frontal sinuses Cardiac: RRR,  Respiratory: CTAB, normal effort. Skin: warm and dry, no rashes noted Neuro:  alert and oriented, no focal deficits   Assessment & Plan:    Acute upper respiratory infection History and physical exam consistent with URI - Will continue conservative measures  - She will try over the counter cold medicines for symtom relief as needed - return precautions given  Hearing loss due to cerumen impaction Significant cerumen burden in bilateral ear canals - Will give debrox ear drops to loosen cerumen and schedule ear clean out on Tuesday 1/3 when clinic reopens after the New Year holiday - pt amenable to this plan     Carrington Olazabal A. Lincoln Brigham MD, [facility] Resident PGY-2 Pager [phone]

## 2015-12-27 NOTE — Assessment & Plan Note (Addendum)
Significant cerumen burden in bilateral ear canals - Will give debrox ear drops to loosen cerumen and schedule ear clean out on Tuesday 1/3 when clinic reopens after the [place] holiday - pt amenable to this plan

## 2015-12-30 ENCOUNTER — Encounter: Payer: Self-pay | Admitting: Student

## 2015-12-30 ENCOUNTER — Ambulatory Visit (INDEPENDENT_AMBULATORY_CARE_PROVIDER_SITE_OTHER): Payer: Medicare Other | Admitting: Student

## 2015-12-30 ENCOUNTER — Ambulatory Visit (INDEPENDENT_AMBULATORY_CARE_PROVIDER_SITE_OTHER): Payer: Medicare Other | Admitting: Pharmacist Clinician (PhC)/ Clinical Pharmacy Specialist

## 2015-12-30 VITALS — BP 125/71 | HR 82 | Temp 98.2°F | Resp 16 | Wt 201.6 lb

## 2015-12-30 DIAGNOSIS — Z7901 Long term (current) use of anticoagulants: Secondary | ICD-10-CM

## 2015-12-30 DIAGNOSIS — H6123 Impacted cerumen, bilateral: Secondary | ICD-10-CM | POA: Diagnosis not present

## 2015-12-30 DIAGNOSIS — I481 Persistent atrial fibrillation: Secondary | ICD-10-CM

## 2015-12-30 DIAGNOSIS — I4819 Other persistent atrial fibrillation: Secondary | ICD-10-CM

## 2015-12-30 DIAGNOSIS — I480 Paroxysmal atrial fibrillation: Secondary | ICD-10-CM | POA: Diagnosis not present

## 2015-12-30 LAB — POCT INR: INR: 1.7

## 2015-12-30 NOTE — Patient Instructions (Signed)
Follow up as needed with PCP I am so glad that you are feeling better!! We also cleaned your ears out today.  If you have any questions or concerns, please call the office at [phone]

## 2015-12-30 NOTE — Assessment & Plan Note (Signed)
Greatly improved post cerumen clean out - will continue to monitor for further intervention should build up occur again

## 2015-12-30 NOTE — Progress Notes (Signed)
Subjective:    Patient ID: [name], female    DOB: 1951-04-02, 65 y.o.   MRN: [id number]   CC: Cerumen removal  HPI 65 y/o for ear cerumen removal. Pt was seen on 12/30 for URI and was found to have decreases hearing due to bilateral cerumen impaction  Cerumen impaction - has been given debrox as prescribed to loosen ear wax - feels the right ear hearing has improved but has not seen any ear wax from ears - still denies ear pain   URI - resolved cough, congestion symptoms - she used robutussin DM and feels that helped  Denies fever, N/V, SOB, chest pain  Review of Systems   See HPI for ROS.   Past Medical History  Diagnosis Date  . Hypertension   . HLD (hyperlipidemia)   . Hypothyroidism   . CAD (coronary artery disease) cardiologist-  dr hochrien    a. PTCA to RCA (02/ 1999);  b.  Myoview 5/10:  EF 71%, no ischemia  . History of MI (myocardial infarction)     1999  . Atrial fibrillation, persistent ([place])   . Anticoagulated on Coumadin   . History of colon polyps     2005  hyperplastic  . Bladder tumor   . Wears glasses   . Full dentures   . Arthritis     wrist, hands -  . Prediabetes   . GERD (gastroesophageal reflux disease)   . SUI (stress urinary incontinence, female)   . Wears glasses    Past Surgical History  Procedure Laterality Date  . Tubal ligation  1974  . Hysteroscopy w/d&c  12/07/2012    Procedure: DILATATION AND CURETTAGE /HYSTEROSCOPY;  Surgeon: Elveria Royals, MD;  Location: [facility];  Service: Gynecology;  Laterality: N/A;  Dilitation and curettage /hysteroscopy/biopsey of lower segment of uterus  . D & c hysteroscopy/  polypectomy  05-08-2009  . Total hip arthroplasty Left 12-11-2010  . Coronary angioplasty  Feb 1999    PTCA to RCA  . Cardiovascular stress test  05-01-2009   dr hochrein    normal nuclear study/  no ischemia or scar/  normal LV function and wall motion, ef 71%  . Transthoracic echocardiogram  09-11-2015    mild  LVH, ef 55%,  mildly reduced RVSF,  trivial TR  . Transurethral resection of bladder tumor N/A 12/01/2015    Procedure: TRANSURETHRAL RESECTION OF BLADDER TUMOR (TURBT);  Surgeon: Kathie Rhodes, MD;  Location: [facility];  Service: Urology;  Laterality: N/A;   OB History    No data available     Social History   Social History  . Marital Status: Married    Spouse Name: Eddie Dibbles  . Number of Children: 5  . Years of Education: 12   Occupational History  . works at a Forensic psychologist    Social History Main Topics  . Smoking status: Former Smoker -- 1.50 packs/day for 15 years    Types: Cigarettes    Quit date: 11/09/1981  . Smokeless tobacco: Never Used  . Alcohol Use: Yes     Comment: occasional  . Drug Use: No  . Sexual Activity: Not on file   Other Topics Concern  . 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  Has 5 adult children all living in the area.      Health Care POA:    Emergency Contact: husband, Marshae Azam,  [phone]   End of Life Plan: discussed AD, gave pt pamphlet   Who lives with you: husband, two sons, granddaughter, and great-grandson.    Any pets: dalamation- Kisses   Diet: Pt has a varied diet of protein, starch and vegetables.   Exercise: Pt has no regular exercise routine.  Does wear fitbit and tracks steps, reports walking 12-13k steps a day when she is working.   Seatbelts: Pt reports wearing seatbelt when in vehicles.    Hobbies: cooking          Objective:  BP 125/71 mmHg  Pulse 82  Temp(Src) 98.2 F (36.8 C) (Oral)  Resp 16  Wt 201 lb 9.6 oz (91.445 kg) Vitals and nursing note reviewed  General: NAD HEENT: left ear canal obstructed by cerumen, right ear canal with moderate cerumen present but can partially see TM Cardiac: RRR, Respiratory: CTAB, normal effort Neuro: alert and oriented, no focal deficits  Ear exam after cerumen removal: TMs visible and wnl bilaterally, cerumen  removed   Assessment & Plan:    Hearing loss due to cerumen impaction Greatly improved post cerumen clean out - will continue to monitor for further intervention should build up occur again     Shaneisha Burkel A. Lincoln Brigham MD, [facility] Resident PGY-2 Pager [phone]

## 2016-01-14 ENCOUNTER — Encounter: Payer: Medicare Other | Admitting: Pharmacist Clinician (PhC)/ Clinical Pharmacy Specialist

## 2016-01-15 ENCOUNTER — Ambulatory Visit (INDEPENDENT_AMBULATORY_CARE_PROVIDER_SITE_OTHER): Payer: Medicare Other | Admitting: Pharmacist Clinician (PhC)/ Clinical Pharmacy Specialist

## 2016-01-15 DIAGNOSIS — R3129 Other microscopic hematuria: Secondary | ICD-10-CM | POA: Diagnosis not present

## 2016-01-15 DIAGNOSIS — Z7901 Long term (current) use of anticoagulants: Secondary | ICD-10-CM

## 2016-01-15 DIAGNOSIS — I480 Paroxysmal atrial fibrillation: Secondary | ICD-10-CM | POA: Diagnosis not present

## 2016-01-15 DIAGNOSIS — N390 Urinary tract infection, site not specified: Secondary | ICD-10-CM | POA: Diagnosis not present

## 2016-01-15 DIAGNOSIS — I481 Persistent atrial fibrillation: Secondary | ICD-10-CM

## 2016-01-15 DIAGNOSIS — Z0000 Encounter for general adult medical examination without abnormal findings: Secondary | ICD-10-CM | POA: Diagnosis not present

## 2016-01-15 DIAGNOSIS — I4819 Other persistent atrial fibrillation: Secondary | ICD-10-CM

## 2016-01-15 LAB — POCT INR: INR: 3.1

## 2016-01-16 ENCOUNTER — Telehealth: Payer: Self-pay | Admitting: Pharmacist Clinician (PhC)/ Clinical Pharmacy Specialist

## 2016-01-16 NOTE — Telephone Encounter (Signed)
Pt states will be on until Monday, MD will get culture back then and decide if to continue on antibiotic.  Has 7 day prescription.  Pt was advised to call if switches abx or goes beyond the 7 days.  Will decrease to 5 mg daily until finished with abx.

## 2016-01-16 NOTE — Telephone Encounter (Signed)
Pt LM earlier, stating on SMX/TMP SS for urinary infection.   Returned call, [facility] for patient to decrease warfarin from 7.5 mg qd x 5 mg SuTh to 5 mg daily.  If she is to take longer than 5 days, please call back as will need to check INR on Monday.

## 2016-01-20 ENCOUNTER — Encounter: Payer: Self-pay | Admitting: Pharmacist Clinician (PhC)/ Clinical Pharmacy Specialist

## 2016-02-10 ENCOUNTER — Other Ambulatory Visit: Payer: Self-pay | Admitting: Cardiology

## 2016-02-10 NOTE — Telephone Encounter (Signed)
Rx(s) sent to pharmacy electronically. OV 02/20/16

## 2016-02-10 NOTE — Telephone Encounter (Signed)
Rx(s) sent to pharmacy electronically.  

## 2016-02-14 ENCOUNTER — Other Ambulatory Visit: Payer: Self-pay | Admitting: Family Medicine

## 2016-02-19 NOTE — Progress Notes (Signed)
HPI The patient presents for evaluation of atrial fibrillation.   Since I last saw her she was in the hospital in September with dizziness. She had some chest pain but troponins were negative. She has had follow-up in our office. An echocardiogram September was unremarkable. I did review her hospital records. Of note she had bladder cancer and had this resected in December. Prior to have some bleeding which she thought was vaginal returned from the bladder. She's not tolerating anticoagulation. She does notice her heart racing occasionally but it settles down after an hour or so. She's under a lot of pressure taking care of a 22-year-old great grandchild. She's not had any further chest pressure, neck or arm discomfort. She's not had any new presyncope or syncope. She has some mild shortness of breath with activity but no PND or orthopnea.  Prior to Admission medications   Medication Sig Start Date End Date Taking? Authorizing Provider  aspirin 81 MG chewable tablet Chew 1 tablet (81 mg total) by mouth daily. 09/12/15  Yes Nicolette Bang, DO  atorvastatin (LIPITOR) 80 MG tablet TAKE 1 TABLET(80 MG) BY MOUTH DAILY 02/10/16  Yes Minus Breeding, MD  calcium carbonate (TUMS - DOSED IN MG ELEMENTAL CALCIUM) 500 MG chewable tablet Chew 1 tablet by mouth as needed for indigestion or heartburn.   Yes Historical Provider, MD  carbamide peroxide (DEBROX) 6.5 % otic solution Place 5 drops into both ears 2 (two) times daily. 12/26/15  Yes Alyssa A Lincoln Brigham, MD  cyclobenzaprine (FLEXERIL) 10 MG tablet Take 0.5-1 tablets (5-10 mg total) by mouth 3 (three) times daily as needed for muscle spasms. 10/20/15  Yes Rosemarie Ax, MD  diltiazem (CARTIA XT) 300 MG 24 hr capsule Take 1 capsule (300 mg total) by mouth daily. 12/11/15  Yes Minus Breeding, MD  fluticasone (FLONASE) 50 MCG/ACT nasal spray Place 2 sprays into both nostrils daily. 11/08/14  Yes Rosemarie Ax, MD  HYDROcodone-acetaminophen [facility])  10-325 MG tablet Take 1-2 tablets by mouth every 4 (four) hours as needed for moderate pain. Maximum dose per 24 hours -8 pills. 12/01/15  Yes Kathie Rhodes, MD  levothyroxine (SYNTHROID, LEVOTHROID) 150 MCG tablet TAKE 1 TABLET BY MOUTH EVERY DAY 02/16/16  Yes Rosemarie Ax, MD  nitroGLYCERIN (NITROSTAT) 0.4 MG SL tablet PLACE 1 TABLET UNDER TONGUE EVERY 5 MINUTES AS NEEDED FOR CHEST PAIN 09/15/15  Yes Rosemarie Ax, MD  warfarin (COUMADIN) 5 MG tablet Take 1.5 tablets (7.5 mg total) by mouth every evening. Patient taking differently: Take 7.5 mg by mouth every evening. Sunday and thrusday's takes only 5 mg 11/27/15  Yes Rosemarie Ax, MD     Allergies  Allergen Reactions  . Penicillins Itching  . Percocet [Oxycodone-Acetaminophen] Itching                                                                                                   Past Medical History  Diagnosis Date  . Hypertension   . HLD (hyperlipidemia)   . Hypothyroidism   . CAD (coronary artery disease) cardiologist-  dr Warren Lacy  a. PTCA to RCA (02/ 1999);  b.  Myoview 5/10:  EF 71%, no ischemia  . History of MI (myocardial infarction)     1999  . Atrial fibrillation, persistent ([place])   . Anticoagulated on Coumadin   . History of colon polyps     2005  hyperplastic  . Bladder tumor   . Wears glasses   . Full dentures   . Arthritis     wrist, hands -  . Prediabetes   . GERD (gastroesophageal reflux disease)   . SUI (stress urinary incontinence, female)   . Wears glasses      Past Surgical History  Procedure Laterality Date  . Tubal ligation  1974  . Hysteroscopy w/d&c  12/07/2012    Procedure: DILATATION AND CURETTAGE /HYSTEROSCOPY;  Surgeon: Elveria Royals, MD;  Location: [facility];  Service: Gynecology;  Laterality: N/A;  Dilitation and curettage /hysteroscopy/biopsey of lower segment of uterus  . D & c hysteroscopy/  polypectomy  05-08-2009  . Total hip arthroplasty Left 12-11-2010  .  Coronary angioplasty  Feb 1999    PTCA to RCA  . Cardiovascular stress test  05-01-2009   dr Dontay Harm    normal nuclear study/  no ischemia or scar/  normal LV function and wall motion, ef 71%  . Transthoracic echocardiogram  09-11-2015    mild LVH, ef 55%,  mildly reduced RVSF,  trivial TR  . Transurethral resection of bladder tumor N/A 12/01/2015    Procedure: TRANSURETHRAL RESECTION OF BLADDER TUMOR (TURBT);  Surgeon: Kathie Rhodes, MD;  Location: [facility];  Service: Urology;  Laterality: N/A;     ROS:  As stated in the HPI and negative for all other systems.  PHYSICAL EXAM BP 110/78 mmHg  Pulse 80  Ht '5\' 3"'$  (1.6 m)  Wt 199 lb 6 oz (90.436 kg)  BMI 35.33 kg/m2 GENERAL:  Well appearing HEENT:  Pupils equal round reactive to light, dentures NECK:  No jugular venous distention, waveform within normal limits, carotid upstroke brisk and symmetric, no bruits, no thyromegaly LUNGS:  Clear to auscultation bilaterally CHEST:  Unremarkable HEART:  PMI not displaced or sustained,S1 and S2 within normal limits, no S3,  n, irregularo clicks, no rubs, no murmurs ABD:  Flat, positive bowel sounds normal in frequency in pitch, no bruits, no rebound, no guarding, no midline pulsatile mass, no hepatomegaly, no splenomegaly EXT:  2 plus pulses throughout, no edema, no cyanosis no clubbing  EKG:  Atrial fibrillation, rate 74, axis within normal limits, intervals within normal limits, poor anterior R wave progression, no acute ST-T wave changes. 02/20/2016  ASSESSMENT AND PLAN  ATRIAL FIBRILLATION -  [name] has a CHA2DS2 - VASc score of 3 with a risk of stroke of 3.2%.  The patient  tolerates this rhythm and rate control and anticoagulation. We will continue with the meds as listed.  She cannot afford a DOAC.  I will place a 24 hour Holter to ensure adequate rate control.    ESSENTIAL HYPERTENSION, BENIGN -  The blood pressure is at target. No change in medications is  indicated. We will continue with therapeutic lifestyle changes (TLC).   CAD - She has no active ischemia and no symptoms of perfusion study in 2010. Continue with risk reduction.   She will stop her ASA.   This was stopped last year but apparently restarted in the hospital.    DYSLIPIDEMIA - She was not taking Lipitor when this was obtained but  she is taking it now.  She will have a lipid profile in July when she sees Clearance Coots, MD   Lab Results  Component Value Date   CHOL 229* 07/01/2015   TRIG 129 07/01/2015   HDL 70 07/01/2015   LDLCALC 133* 07/01/2015   LDLDIRECT 125* 06/01/2010

## 2016-02-20 ENCOUNTER — Encounter: Payer: Self-pay | Admitting: Cardiology

## 2016-02-20 ENCOUNTER — Ambulatory Visit (INDEPENDENT_AMBULATORY_CARE_PROVIDER_SITE_OTHER): Payer: Medicare Other | Admitting: Cardiology

## 2016-02-20 ENCOUNTER — Encounter: Payer: Medicare Other | Admitting: Pharmacist Clinician (PhC)/ Clinical Pharmacy Specialist

## 2016-02-20 ENCOUNTER — Ambulatory Visit (INDEPENDENT_AMBULATORY_CARE_PROVIDER_SITE_OTHER): Payer: Medicare Other

## 2016-02-20 VITALS — BP 110/78 | HR 80 | Ht 63.0 in | Wt 199.4 lb

## 2016-02-20 DIAGNOSIS — I4891 Unspecified atrial fibrillation: Secondary | ICD-10-CM | POA: Diagnosis not present

## 2016-02-20 NOTE — Patient Instructions (Signed)
Your physician has recommended that you wear a holter monitor. Holter monitors are medical devices that record the heart's electrical activity. Doctors most often use these monitors to diagnose arrhythmias. Arrhythmias are problems with the speed or rhythm of the heartbeat. The monitor is a small, portable device. You can wear one while you do your normal daily activities. This is usually used to diagnose what is causing palpitations/syncope (passing out).  Dr Percival Spanish recommends that you schedule a follow-up appointment in 1 year. You will receive a reminder letter in the mail two months in advance. If you don't receive a letter, please call our office to schedule the follow-up appointment.  If you need a refill on your cardiac medications before your next appointment, please call your pharmacy.  STOP TAKING ASPIRIN

## 2016-02-23 ENCOUNTER — Ambulatory Visit (INDEPENDENT_AMBULATORY_CARE_PROVIDER_SITE_OTHER): Payer: Medicare Other | Admitting: Pharmacist Clinician (PhC)/ Clinical Pharmacy Specialist

## 2016-02-23 DIAGNOSIS — I480 Paroxysmal atrial fibrillation: Secondary | ICD-10-CM | POA: Diagnosis not present

## 2016-02-23 DIAGNOSIS — Z7901 Long term (current) use of anticoagulants: Secondary | ICD-10-CM | POA: Diagnosis not present

## 2016-02-23 DIAGNOSIS — I481 Persistent atrial fibrillation: Secondary | ICD-10-CM

## 2016-02-23 DIAGNOSIS — I4819 Other persistent atrial fibrillation: Secondary | ICD-10-CM

## 2016-02-23 LAB — POCT INR: INR: 3.8

## 2016-02-24 NOTE — Addendum Note (Signed)
Addended by: Fidel Levy on: 02/24/2016 02:21 PM   Modules accepted: Orders

## 2016-03-08 ENCOUNTER — Ambulatory Visit (INDEPENDENT_AMBULATORY_CARE_PROVIDER_SITE_OTHER): Payer: Medicare Other | Admitting: Pharmacist Clinician (PhC)/ Clinical Pharmacy Specialist

## 2016-03-08 ENCOUNTER — Telehealth: Payer: Self-pay | Admitting: Cardiology

## 2016-03-08 DIAGNOSIS — I480 Paroxysmal atrial fibrillation: Secondary | ICD-10-CM

## 2016-03-08 DIAGNOSIS — I4819 Other persistent atrial fibrillation: Secondary | ICD-10-CM

## 2016-03-08 DIAGNOSIS — I481 Persistent atrial fibrillation: Secondary | ICD-10-CM | POA: Diagnosis not present

## 2016-03-08 DIAGNOSIS — Z7901 Long term (current) use of anticoagulants: Secondary | ICD-10-CM

## 2016-03-08 LAB — POCT INR: INR: 2.2

## 2016-03-08 NOTE — Telephone Encounter (Signed)
Pt returning call,she does not know who called.

## 2016-03-10 NOTE — Telephone Encounter (Signed)
Spoke to pt about her monitor result

## 2016-03-11 DIAGNOSIS — Z0000 Encounter for general adult medical examination without abnormal findings: Secondary | ICD-10-CM | POA: Diagnosis not present

## 2016-03-11 DIAGNOSIS — C678 Malignant neoplasm of overlapping sites of bladder: Secondary | ICD-10-CM | POA: Diagnosis not present

## 2016-03-21 ENCOUNTER — Other Ambulatory Visit: Payer: Self-pay | Admitting: Family Medicine

## 2016-03-23 ENCOUNTER — Emergency Department (HOSPITAL_COMMUNITY)
Admission: EM | Admit: 2016-03-23 | Discharge: 2016-03-24 | Disposition: A | Discharge status: Home / Self Care | Payer: Medicare Other | Attending: Emergency Medicine | Admitting: Emergency Medicine

## 2016-03-23 ENCOUNTER — Encounter (HOSPITAL_COMMUNITY): Payer: Self-pay | Admitting: Family Medicine

## 2016-03-23 DIAGNOSIS — S39012A Strain of muscle, fascia and tendon of lower back, initial encounter: Secondary | ICD-10-CM | POA: Insufficient documentation

## 2016-03-23 DIAGNOSIS — Z880 Allergy status to penicillin: Secondary | ICD-10-CM | POA: Diagnosis not present

## 2016-03-23 DIAGNOSIS — Z7901 Long term (current) use of anticoagulants: Secondary | ICD-10-CM | POA: Insufficient documentation

## 2016-03-23 DIAGNOSIS — Z98811 Dental restoration status: Secondary | ICD-10-CM | POA: Insufficient documentation

## 2016-03-23 DIAGNOSIS — Z973 Presence of spectacles and contact lenses: Secondary | ICD-10-CM | POA: Diagnosis not present

## 2016-03-23 DIAGNOSIS — E039 Hypothyroidism, unspecified: Secondary | ICD-10-CM | POA: Insufficient documentation

## 2016-03-23 DIAGNOSIS — M19039 Primary osteoarthritis, unspecified wrist: Secondary | ICD-10-CM | POA: Diagnosis not present

## 2016-03-23 DIAGNOSIS — I10 Essential (primary) hypertension: Secondary | ICD-10-CM | POA: Insufficient documentation

## 2016-03-23 DIAGNOSIS — I4891 Unspecified atrial fibrillation: Secondary | ICD-10-CM | POA: Insufficient documentation

## 2016-03-23 DIAGNOSIS — Z8551 Personal history of malignant neoplasm of bladder: Secondary | ICD-10-CM | POA: Diagnosis not present

## 2016-03-23 DIAGNOSIS — I2510 Atherosclerotic heart disease of native coronary artery without angina pectoris: Secondary | ICD-10-CM | POA: Diagnosis not present

## 2016-03-23 DIAGNOSIS — Y998 Other external cause status: Secondary | ICD-10-CM | POA: Insufficient documentation

## 2016-03-23 DIAGNOSIS — I252 Old myocardial infarction: Secondary | ICD-10-CM | POA: Insufficient documentation

## 2016-03-23 DIAGNOSIS — E785 Hyperlipidemia, unspecified: Secondary | ICD-10-CM | POA: Insufficient documentation

## 2016-03-23 DIAGNOSIS — X58XXXA Exposure to other specified factors, initial encounter: Secondary | ICD-10-CM | POA: Insufficient documentation

## 2016-03-23 DIAGNOSIS — Y9289 Other specified places as the place of occurrence of the external cause: Secondary | ICD-10-CM | POA: Insufficient documentation

## 2016-03-23 DIAGNOSIS — S3992XA Unspecified injury of lower back, initial encounter: Secondary | ICD-10-CM | POA: Diagnosis present

## 2016-03-23 DIAGNOSIS — Z9861 Coronary angioplasty status: Secondary | ICD-10-CM | POA: Insufficient documentation

## 2016-03-23 DIAGNOSIS — Z79899 Other long term (current) drug therapy: Secondary | ICD-10-CM | POA: Insufficient documentation

## 2016-03-23 DIAGNOSIS — Z87891 Personal history of nicotine dependence: Secondary | ICD-10-CM | POA: Insufficient documentation

## 2016-03-23 DIAGNOSIS — K219 Gastro-esophageal reflux disease without esophagitis: Secondary | ICD-10-CM | POA: Insufficient documentation

## 2016-03-23 DIAGNOSIS — M62830 Muscle spasm of back: Secondary | ICD-10-CM | POA: Diagnosis not present

## 2016-03-23 DIAGNOSIS — Y9389 Activity, other specified: Secondary | ICD-10-CM | POA: Insufficient documentation

## 2016-03-23 DIAGNOSIS — Z86010 Personal history of colonic polyps: Secondary | ICD-10-CM | POA: Diagnosis not present

## 2016-03-23 DIAGNOSIS — Z7952 Long term (current) use of systemic steroids: Secondary | ICD-10-CM | POA: Insufficient documentation

## 2016-03-23 DIAGNOSIS — M19042 Primary osteoarthritis, left hand: Secondary | ICD-10-CM | POA: Diagnosis not present

## 2016-03-23 DIAGNOSIS — M19041 Primary osteoarthritis, right hand: Secondary | ICD-10-CM | POA: Insufficient documentation

## 2016-03-23 DIAGNOSIS — M549 Dorsalgia, unspecified: Secondary | ICD-10-CM | POA: Diagnosis not present

## 2016-03-23 DIAGNOSIS — R52 Pain, unspecified: Secondary | ICD-10-CM | POA: Diagnosis not present

## 2016-03-23 MED ORDER — HYDROMORPHONE HCL 1 MG/ML IJ SOLN
0.5000 mg | Freq: Once | INTRAMUSCULAR | Status: AC
  Administered 2016-03-23: 0.5 mg via INTRAVENOUS
  Filled 2016-03-23: qty 1

## 2016-03-23 MED ORDER — KETOROLAC TROMETHAMINE 15 MG/ML IJ SOLN
15.0000 mg | Freq: Once | INTRAMUSCULAR | Status: AC
  Administered 2016-03-23: 15 mg via INTRAVENOUS
  Filled 2016-03-23: qty 1

## 2016-03-23 NOTE — ED Provider Notes (Signed)
CSN: [id number]     Arrival date & time 03/23/16  2322 History   By signing my name below, I, Forrestine Him, attest that this documentation has been prepared under the direction and in the presence of Orpah Greek, MD.  Electronically Signed: Forrestine Him, ED Scribe. 03/23/2016. 11:44 PM.   Chief Complaint  Patient presents with  . Back Pain   The history is provided by the patient. No language interpreter was used.    HPI Comments: [name] is a 65 y.o. female with a PMHx of HTN, HLD, CAD, and prediabetes who presents to the Emergency Department complaining of constant, ongoing, non-radiating lower back pain x 2-3 weeks; worsened in last 24 hours. Pain is described as a dull ache. Discomfort worsened after reaching over while in her bed and continues to exacerbate with movement. No alleviating factors at this time. No OTC medications or home remedies attempted prior to arrival. No recent fever, chills, nausea, vomiting, abdominal pain, or dysuria. No bowel or urinary incontinence.  PCP: Clearance Coots, MD    Past Medical History  Diagnosis Date  . Hypertension   . HLD (hyperlipidemia)   . Hypothyroidism   . CAD (coronary artery disease) cardiologist-  dr hochrien    a. PTCA to RCA (02/ 1999);  b.  Myoview 5/10:  EF 71%, no ischemia  . History of MI (myocardial infarction)     1999  . Atrial fibrillation, persistent ([employer])   . Anticoagulated on Coumadin   . History of colon polyps     2005  hyperplastic  . Bladder tumor   . Wears glasses   . Full dentures   . Arthritis     wrist, hands -  . Prediabetes   . GERD (gastroesophageal reflux disease)   . SUI (stress urinary incontinence, female)   . Wears glasses    Past Surgical History  Procedure Laterality Date  . Tubal ligation  1974  . Hysteroscopy w/d&c  12/07/2012    Procedure: DILATATION AND CURETTAGE /HYSTEROSCOPY;  Surgeon: Elveria Royals, MD;  Location: [facility];  Service: Gynecology;  Laterality: N/A;   Dilitation and curettage /hysteroscopy/biopsey of lower segment of uterus  . D & c hysteroscopy/  polypectomy  05-08-2009  . Total hip arthroplasty Left 12-11-2010  . Coronary angioplasty  Feb 1999    PTCA to RCA  . Cardiovascular stress test  05-01-2009   dr hochrein    normal nuclear study/  no ischemia or scar/  normal LV function and wall motion, ef 71%  . Transthoracic echocardiogram  09-11-2015    mild LVH, ef 55%,  mildly reduced RVSF,  trivial TR  . Transurethral resection of bladder tumor N/A 12/01/2015    Procedure: TRANSURETHRAL RESECTION OF BLADDER TUMOR (TURBT);  Surgeon: Kathie Rhodes, MD;  Location: [facility];  Service: Urology;  Laterality: N/A;   Family History  Problem Relation Age of Onset  . Diabetes Mother   . Heart disease Mother   . Colon cancer Mother 90  . Heart disease Father   . Cancer Brother     intestinal  . Cancer Brother     throat ca  . Cancer Brother     bladder  . Cancer Sister     Lung   Social History  Substance Use Topics  . Smoking status: Former Smoker -- 1.50 packs/day for 15 years    Types: Cigarettes    Quit date: 11/09/1981  . Smokeless tobacco: Never Used  .  Alcohol Use: No   OB History    No data available     Review of Systems  Constitutional: Negative for fever and chills.  Respiratory: Negative for shortness of breath.   Cardiovascular: Negative for chest pain.  Gastrointestinal: Negative for nausea, vomiting and abdominal pain.  Genitourinary: Negative for dysuria.  Musculoskeletal: Positive for back pain.  Neurological: Negative for headaches.  Psychiatric/Behavioral: Negative for confusion.  All other systems reviewed and are negative.     Allergies  Penicillins and Percocet  Home Medications   Prior to Admission medications   Medication Sig Start Date End Date Taking? Authorizing Provider  atorvastatin (LIPITOR) 80 MG tablet TAKE 1 TABLET(80 MG) BY MOUTH DAILY 02/10/16   Minus Breeding, MD   calcium carbonate (TUMS - DOSED IN MG ELEMENTAL CALCIUM) 500 MG chewable tablet Chew 1 tablet by mouth as needed for indigestion or heartburn.    Historical Provider, MD  carbamide peroxide (DEBROX) 6.5 % otic solution Place 5 drops into both ears 2 (two) times daily. 12/26/15   Veatrice Bourbon, MD  cyclobenzaprine (FLEXERIL) 10 MG tablet Take 0.5-1 tablets (5-10 mg total) by mouth 3 (three) times daily as needed for muscle spasms. 10/20/15   Rosemarie Ax, MD  diltiazem (CARTIA XT) 300 MG 24 hr capsule Take 1 capsule (300 mg total) by mouth daily. 12/11/15   Minus Breeding, MD  fluticasone (FLONASE) 50 MCG/ACT nasal spray Place 2 sprays into both nostrils daily. 11/08/14   Rosemarie Ax, MD  HYDROcodone-acetaminophen [facility]) 10-325 MG tablet Take 1-2 tablets by mouth every 4 (four) hours as needed for moderate pain. Maximum dose per 24 hours -8 pills. 12/01/15   Kathie Rhodes, MD  levothyroxine (SYNTHROID, LEVOTHROID) 150 MCG tablet TAKE 1 TABLET BY MOUTH EVERY DAY 02/16/16   Rosemarie Ax, MD  nitroGLYCERIN (NITROSTAT) 0.4 MG SL tablet PLACE 1 TABLET UNDER TONGUE EVERY 5 MINUTES AS NEEDED FOR CHEST PAIN 09/15/15   Rosemarie Ax, MD  warfarin (COUMADIN) 5 MG tablet Take 1.5 tablets (7.5 mg total) by mouth every evening. Patient taking differently: Take 7.5 mg by mouth every evening. Sunday and thrusday's takes only 5 mg 11/27/15   Rosemarie Ax, MD  warfarin (COUMADIN) 5 MG tablet TAKE 1 AND 1/2 TABLETS BY MOUTH EVERY EVENING 03/22/16   Rosemarie Ax, MD   Triage Vitals: BP 142/86 mmHg  Pulse 103  Temp(Src) 98.4 F (36.9 C) (Oral)  Resp 20  Ht '5\' 3"'$  (1.6 m)  Wt 198 lb (89.812 kg)  BMI 35.08 kg/m2  SpO2 95%   Physical Exam  Constitutional: She is oriented to person, place, and time. She appears well-developed and well-nourished. No distress.  HENT:  Head: Normocephalic and atraumatic.  Right Ear: Hearing normal.  Left Ear: Hearing normal.  Nose: Nose normal.  Mouth/Throat:  Oropharynx is clear and moist and mucous membranes are normal.  Eyes: Conjunctivae and EOM are normal. Pupils are equal, round, and reactive to light.  Neck: Normal range of motion. Neck supple.  Cardiovascular: Normal rate, regular rhythm, S1 normal, S2 normal and normal heart sounds.  Exam reveals no gallop and no friction rub.   No murmur heard. Pulmonary/Chest: Effort normal and breath sounds normal. No respiratory distress. She exhibits no tenderness.  Abdominal: Soft. Normal appearance and bowel sounds are normal. There is no hepatosplenomegaly. There is no tenderness. There is no rebound, no guarding, no tenderness at McBurney's point and negative Murphy's sign. No hernia.  Musculoskeletal: Normal range  of motion.  Tenderness and spasm to R lower lumbar paraspinal muscles  Normal strength and sensation to lower extremities   Neurological: She is alert and oriented to person, place, and time. She has normal strength. No cranial nerve deficit or sensory deficit. Coordination normal. GCS eye subscore is 4. GCS verbal subscore is 5. GCS motor subscore is 6.  Normal patellar reflexes Negative straight leg raise  No foot drop or saddle anesthesia   Skin: Skin is warm, dry and intact. No rash noted. No cyanosis.  Psychiatric: She has a normal mood and affect. Her speech is normal and behavior is normal. Thought content normal.  Nursing note and vitals reviewed.   ED Course  Procedures (including critical care time)  DIAGNOSTIC STUDIES: Oxygen Saturation is 95% on RA, Adequate by my interpretation.    COORDINATION OF CARE: 11:41 PM- Will give Toradol and Dilaudid. Discussed treatment plan with pt at bedside and pt agreed to plan.     Labs Review Labs Reviewed - No data to display  Imaging Review No results found. I have personally reviewed and evaluated these images and lab results as part of my medical decision-making.   EKG Interpretation None      MDM   Final diagnoses:   None  lumbar strain Muscle spasm  Patient presents to the ER with musculoskeletal back pain. Examination reveals back tenderness without any associated neurologic findings. Patient's strength, sensation and reflexes were normal. As such, patient did not require any imaging or further studies. Patient was treated with analgesia.  I personally performed the services described in this documentation, which was scribed in my presence. The recorded information has been reviewed and is accurate.    Orpah Greek, MD 03/24/16 [phone]

## 2016-03-23 NOTE — ED Notes (Signed)
Bed: WA01 Expected date:  Expected time:  Means of arrival:  Comments: Back pain 

## 2016-03-23 NOTE — ED Notes (Signed)
Patient is from home and transported via [facility]. Pt is complaining of right lower back pain. Described as spasm. Denies any recent injury. Symptoms started 2-3 weeks ago but got worse tonight around 20:00.

## 2016-03-24 MED ORDER — HYDROCODONE-ACETAMINOPHEN 5-325 MG PO TABS: 1.0000 | ORAL_TABLET | ORAL | Status: DC | PRN

## 2016-03-24 MED ORDER — DIAZEPAM 5 MG PO TABS: 5.0000 mg | ORAL_TABLET | Freq: Three times a day (TID) | ORAL | Status: DC | PRN

## 2016-03-24 MED ORDER — DIAZEPAM 5 MG/ML IJ SOLN
2.5000 mg | Freq: Once | INTRAMUSCULAR | Status: AC
  Administered 2016-03-24: 2.5 mg via INTRAVENOUS
  Filled 2016-03-24: qty 2

## 2016-03-24 NOTE — Discharge Instructions (Signed)

## 2016-04-05 ENCOUNTER — Ambulatory Visit (INDEPENDENT_AMBULATORY_CARE_PROVIDER_SITE_OTHER): Payer: Medicare Other | Admitting: Pharmacist Clinician (PhC)/ Clinical Pharmacy Specialist

## 2016-04-05 DIAGNOSIS — I480 Paroxysmal atrial fibrillation: Secondary | ICD-10-CM | POA: Diagnosis not present

## 2016-04-05 DIAGNOSIS — I481 Persistent atrial fibrillation: Secondary | ICD-10-CM

## 2016-04-05 DIAGNOSIS — Z7901 Long term (current) use of anticoagulants: Secondary | ICD-10-CM

## 2016-04-05 DIAGNOSIS — I4819 Other persistent atrial fibrillation: Secondary | ICD-10-CM

## 2016-04-05 LAB — POCT INR: INR: 2.7

## 2016-05-03 ENCOUNTER — Encounter: Payer: Medicare Other | Admitting: Pharmacist Clinician (PhC)/ Clinical Pharmacy Specialist

## 2016-05-05 ENCOUNTER — Ambulatory Visit (INDEPENDENT_AMBULATORY_CARE_PROVIDER_SITE_OTHER): Payer: Medicare Other | Admitting: Pharmacist

## 2016-05-05 ENCOUNTER — Other Ambulatory Visit: Payer: Self-pay | Admitting: Cardiology

## 2016-05-05 DIAGNOSIS — I480 Paroxysmal atrial fibrillation: Secondary | ICD-10-CM | POA: Diagnosis not present

## 2016-05-05 DIAGNOSIS — I481 Persistent atrial fibrillation: Secondary | ICD-10-CM | POA: Diagnosis not present

## 2016-05-05 DIAGNOSIS — I4819 Other persistent atrial fibrillation: Secondary | ICD-10-CM

## 2016-05-05 DIAGNOSIS — Z7901 Long term (current) use of anticoagulants: Secondary | ICD-10-CM

## 2016-05-05 LAB — POCT INR: INR: 2.5

## 2016-05-05 NOTE — Telephone Encounter (Signed)
Rx has been sent to the pharmacy electronically. ° °

## 2016-06-09 ENCOUNTER — Ambulatory Visit (INDEPENDENT_AMBULATORY_CARE_PROVIDER_SITE_OTHER): Payer: Medicare Other | Admitting: Pharmacist

## 2016-06-09 DIAGNOSIS — I480 Paroxysmal atrial fibrillation: Secondary | ICD-10-CM | POA: Diagnosis not present

## 2016-06-09 DIAGNOSIS — Z7901 Long term (current) use of anticoagulants: Secondary | ICD-10-CM

## 2016-06-09 DIAGNOSIS — I4819 Other persistent atrial fibrillation: Secondary | ICD-10-CM

## 2016-06-09 DIAGNOSIS — I481 Persistent atrial fibrillation: Secondary | ICD-10-CM

## 2016-06-09 LAB — POCT INR: INR: 2.4

## 2016-06-15 DIAGNOSIS — Z8551 Personal history of malignant neoplasm of bladder: Secondary | ICD-10-CM | POA: Diagnosis not present

## 2016-07-02 ENCOUNTER — Other Ambulatory Visit: Payer: Self-pay | Admitting: Family Medicine

## 2016-07-05 NOTE — Telephone Encounter (Signed)
2nd request.  Naksh Radi L, RN  

## 2016-07-06 NOTE — Telephone Encounter (Signed)
3rd request. Advit Trethewey L, RN  

## 2016-07-09 ENCOUNTER — Other Ambulatory Visit (HOSPITAL_COMMUNITY)
Admission: RE | Admit: 2016-07-09 | Discharge: 2016-07-09 | Disposition: A | Discharge status: Home / Self Care | Payer: Medicare Other | Source: Ambulatory Visit | Attending: Family Medicine | Admitting: Family Medicine

## 2016-07-09 ENCOUNTER — Ambulatory Visit (INDEPENDENT_AMBULATORY_CARE_PROVIDER_SITE_OTHER): Payer: Medicare Other | Admitting: Family Medicine

## 2016-07-09 ENCOUNTER — Encounter: Payer: Self-pay | Admitting: Family Medicine

## 2016-07-09 VITALS — BP 116/78 | HR 85 | Temp 98.4°F | Ht 63.0 in | Wt 194.0 lb

## 2016-07-09 DIAGNOSIS — Z1151 Encounter for screening for human papillomavirus (HPV): Secondary | ICD-10-CM | POA: Diagnosis present

## 2016-07-09 DIAGNOSIS — Z01419 Encounter for gynecological examination (general) (routine) without abnormal findings: Secondary | ICD-10-CM | POA: Diagnosis not present

## 2016-07-09 DIAGNOSIS — Z01411 Encounter for gynecological examination (general) (routine) with abnormal findings: Secondary | ICD-10-CM | POA: Insufficient documentation

## 2016-07-09 DIAGNOSIS — I10 Essential (primary) hypertension: Secondary | ICD-10-CM

## 2016-07-09 DIAGNOSIS — Z124 Encounter for screening for malignant neoplasm of cervix: Secondary | ICD-10-CM | POA: Diagnosis not present

## 2016-07-09 DIAGNOSIS — Z1159 Encounter for screening for other viral diseases: Secondary | ICD-10-CM | POA: Diagnosis not present

## 2016-07-09 DIAGNOSIS — E039 Hypothyroidism, unspecified: Secondary | ICD-10-CM

## 2016-07-09 NOTE — Progress Notes (Signed)
Sara Ok, MD Phone: [phone]  Subjective:  CC -- Annual Physical; No complaints.  Pt reports she is feeling well. Had some back pain that she was seen in the emergency room for, but ED provider felt musculoskeletal. She was rx'd valium and norco, which she is not currently requiring. Otherwise well today.  General Healthcare: Medication Compliance: yes  Cardiac Risk as of 2017(date): afib, managed with cardiology on warfarin Aspirin: no Dx Hypertension: yes  Dx Hyperlipidemia: no  Diabetes: no  Dx Obesity: yes, class I Weight Loss: no, pt does not consider her weight a problem, we discussed aquatic aerobic exercise as pt has back and knee pain chronically, but she has no access to a pool. She does stay fairly active with her 56 year old great grandson living with her. Physical Activity: yes, 2x weekly walking and keeping up with a  84 year old  Urinary Incontinence: yes,occasional, h/x bladder cancer s/p resection 10/2015, follows with urology  Social: Driving: yes Alcohol Use: no  Tobacco Use: former smoker, quit 25 years ago Other Drugs: no  Independence: Pt lives with husband and 59 year old great grandson Depression: no   - PHQ9 score:  Support and Life at Home: yes  Advanced Directives: no, given paperwork again today, thinks she lost it last time   Cancer:  Colorectal >> Colonoscopy: yes, due 05/2019  Lung >> Tobacco Use: yes   - If so, previous Low-Dose CT screen: yes, pt reports yes, unable to find in Epic  Breast >> Mammogram: yes, due 04/2017  Cervical/Endometrial >>  - Postmenopausal: yes - Hysterectomy: no  - Vaginal Bleeding: no - Pap Smear: yes   - Previous Abnormal Pap: no  Skin >> Suspicious lesions: no   Other: Osteoporosis: yes, DEXA 04/2015 (T score 0.0, Z score 1.8), FRAX scoring risk of major osteoporotic fracture in 10 years 6.5%, hip fracture 0.5%   Zoster Vaccine: no, too expensive per patient  Flu Vaccine: yes  Pneumonia Vaccine: yes    ROS- Pt endorses trouble hearing when her ears get "clogged up," and leakage of urine s/p transurethral resection of bladder tumor and instillation of intravesical chemotherapy. Otherwise denies chest pain, weakness, dizziness.   Past Medical History Patient Active Problem List   Diagnosis Date Noted  . Acute upper respiratory infection 12/27/2015  . Hearing loss due to cerumen impaction 12/27/2015  . Back pain 10/20/2015  . Ethmoid sinusitis 09/22/2015  . Near syncope 09/11/2015  . Atrial fibrillation with RVR ([place]) 09/11/2015  . Abnormal vaginal bleeding 05/06/2015  . Hematuria 05/06/2015  . Disease of middle ear or mastoid 04/10/2015  . Long-term (current) use of anticoagulants 02/19/2015  . Long QT interval 11/08/2014  . Bradycardia, sinus   . Family history of colon cancer - mother in 57's 06/04/2014  . Prediabetes 04/11/2014  . Atrial fibrillation ([place]) 12/18/2013  . Depression, major ([place]) 04/19/2012  . Colon polyps 05/06/2011  . Postmenopausal bleeding 05/06/2011  . DEGENERATIVE JOINT DISEASE, LEFT HIP 07/20/2010  . HLD (hyperlipidemia) 04/29/2009  . OBESITY, UNSPECIFIED 04/29/2009  . Essential hypertension, benign 04/29/2009  . Coronary atherosclerosis 04/29/2009  . Hypothyroidism 02/23/2007  . MYOCARDIAL INFARCTION, OLD 02/23/2007  . Personal history of colonic adenoma 11/11/2004    Medications- reviewed and updated Current Outpatient Prescriptions  Medication Sig Dispense Refill  . atorvastatin (LIPITOR) 80 MG tablet TAKE 1 TABLET(80 MG) BY MOUTH DAILY 90 tablet 0  . calcium carbonate (TUMS - DOSED IN MG ELEMENTAL CALCIUM) 500 MG chewable tablet Chew  1 tablet by mouth as needed for indigestion or heartburn.    . carbamide peroxide (DEBROX) 6.5 % otic solution Place 5 drops into both ears 2 (two) times daily. 15 mL 0  . CARTIA XT 300 MG 24 hr capsule TAKE 1 CAPSULE(300 MG) BY MOUTH DAILY 30 capsule 9  . cyclobenzaprine (FLEXERIL) 10 MG tablet Take 0.5-1 tablets  (5-10 mg total) by mouth 3 (three) times daily as needed for muscle spasms. 30 tablet 1  . diazepam (VALIUM) 5 MG tablet Take 1 tablet (5 mg total) by mouth every 8 (eight) hours as needed for muscle spasms. 10 tablet 0  . diltiazem (CARTIA XT) 300 MG 24 hr capsule Take 1 capsule (300 mg total) by mouth daily. 30 capsule 2  . fluticasone (FLONASE) 50 MCG/ACT nasal spray Place 2 sprays into both nostrils daily. 16 g 6  . HYDROcodone-acetaminophen (NORCO/VICODIN) 5-325 MG tablet Take 1-2 tablets by mouth every 4 (four) hours as needed for moderate pain. 20 tablet 0  . levothyroxine (SYNTHROID, LEVOTHROID) 150 MCG tablet TAKE 1 TABLET BY MOUTH EVERY DAY 90 tablet 0  . nitroGLYCERIN (NITROSTAT) 0.4 MG SL tablet PLACE 1 TABLET UNDER TONGUE EVERY 5 MINUTES AS NEEDED FOR CHEST PAIN 75 tablet 1  . warfarin (COUMADIN) 5 MG tablet Take 1.5 tablets (7.5 mg total) by mouth every evening. (Patient taking differently: Take 7.5 mg by mouth every evening. Sunday tuesday and thrusday's takes only 5 mg) 30 tablet 1  . warfarin (COUMADIN) 5 MG tablet TAKE 1 AND 1/2 TABLETS BY MOUTH EVERY EVENING 135 tablet 0  . [DISCONTINUED] diltiazem (DILACOR XR) 120 MG 24 hr capsule Take 1 capsule (120 mg total) by mouth daily. 30 capsule 6   No current facility-administered medications for this visit.    Objective: BP 116/78 mmHg  Pulse 85  Temp(Src) 98.4 F (36.9 C) (Oral)  Ht '5\' 3"'$  (1.6 m)  Wt 194 lb (87.998 kg)  BMI 34.37 kg/m2 Gen: NAD, alert, cooperative with exam HEENT: NCAT, EOMI, PERRL CV: RRR, good S1/S2, no murmur Resp: CTABL, no wheezes, non-labored Abd: Soft, Non Tender, Non Distended Genital Exam: not done Vagina: not indicated and normal appearing vagina with normal color and discharge, no lesions Cervix: not indicated, normal appearing cervix without discharge or lesions and friable  Ext: No edema, warm Neuro: Alert and oriented, No gross deficits   Assessment/Plan: Hypertension: well controlled on  diltiazem. -future order for CMP  Cardiovascular risk: 6.8% based on last lipid profile, will repeat today -future order for lipid profile  Hypothyroidism: stable on current dose of 173mg -last TSH 1.794, free T4 1.43 -future order for repeat TSH and free T4  KRalene Ok MD, PGY1 07/09/2016 2:40 PM

## 2016-07-09 NOTE — Patient Instructions (Signed)
Please return to clinic in 1 year, or sooner if needed. Great to see you today!  Heart-Healthy Eating Plan Many factors influence your heart health, including eating and exercise habits. Heart (coronary) risk increases with abnormal blood fat (lipid) levels. Heart-healthy meal planning includes limiting unhealthy fats, increasing healthy fats, and making other small dietary changes. This includes maintaining a healthy body weight to help keep lipid levels within a normal range. WHAT IS MY PLAN?  Your health care provider recommends that you:  Get no more than _________% of the total calories in your daily diet from fat.  Limit your intake of saturated fat to less than _________% of your total calories each day.  Limit the amount of cholesterol in your diet to less than _________ mg per day. WHAT TYPES OF FAT SHOULD I CHOOSE?  Choose healthy fats more often. Choose monounsaturated and polyunsaturated fats, such as olive oil and canola oil, flaxseeds, walnuts, almonds, and seeds.  Eat more omega-3 fats. Good choices include salmon, mackerel, sardines, tuna, flaxseed oil, and ground flaxseeds. Aim to eat fish at least two times each week.  Limit saturated fats. Saturated fats are primarily found in animal products, such as meats, butter, and cream. Plant sources of saturated fats include palm oil, palm kernel oil, and coconut oil.  Avoid foods with partially hydrogenated oils in them. These contain trans fats. Examples of foods that contain trans fats are stick margarine, some tub margarines, cookies, crackers, and other baked goods. WHAT GENERAL GUIDELINES DO I NEED TO FOLLOW?  Check food labels carefully to identify foods with trans fats or high amounts of saturated fat.  Fill one half of your plate with vegetables and green salads. Eat 4-5 servings of vegetables per day. A serving of vegetables equals 1 cup of raw leafy vegetables,  cup of raw or cooked cut-up vegetables, or  cup of  vegetable juice.  Fill one fourth of your plate with whole grains. Look for the word "whole" as the first word in the ingredient list.  Fill one fourth of your plate with lean protein foods.  Eat 4-5 servings of fruit per day. A serving of fruit equals one medium whole fruit,  cup of dried fruit,  cup of fresh, frozen, or canned fruit, or  cup of 100% fruit juice.  Eat more foods that contain soluble fiber. Examples of foods that contain this type of fiber are apples, broccoli, carrots, beans, peas, and barley. Aim to get 20-30 g of fiber per day.  Eat more home-cooked food and less restaurant, buffet, and fast food.  Limit or avoid alcohol.  Limit foods that are high in starch and sugar.  Avoid fried foods.  Cook foods by using methods other than frying. Baking, boiling, grilling, and broiling are all great options. Other fat-reducing suggestions include:  Removing the skin from poultry.  Removing all visible fats from meats.  Skimming the fat off of stews, soups, and gravies before serving them.  Steaming vegetables in water or broth.  Lose weight if you are overweight. Losing just 5-10% of your initial body weight can help your overall health and prevent diseases such as diabetes and heart disease.  Increase your consumption of nuts, legumes, and seeds to 4-5 servings per week. One serving of dried beans or legumes equals  cup after being cooked, one serving of nuts equals 1 ounces, and one serving of seeds equals  ounce or 1 tablespoon.  You may need to monitor your salt (sodium) intake,  especially if you have high blood pressure. Talk with your health care provider or dietitian to get more information about reducing sodium. WHAT FOODS CAN I EAT? Grains Breads, including [place], white, pita, wheat, raisin, rye, oatmeal, and [place]. Tortillas that are neither fried nor made with lard or trans fat. Low-fat rolls, including hotdog and hamburger buns and English muffins.  Biscuits. Muffins. Waffles. Pancakes. Light popcorn. Whole-grain cereals. Flatbread. Melba toast. Pretzels. Breadsticks. Rusks. Low-fat snacks and crackers, including oyster, saltine, matzo, graham, animal, and rye. Rice and pasta, including brown rice and those that are made with whole wheat. Vegetables All vegetables. Fruits All fruits, but limit coconut. Meats and Other Protein Sources Lean, well-trimmed beef, veal, pork, and lamb. Chicken and [place] without skin. All fish and shellfish. Wild duck, rabbit, pheasant, and venison. Egg whites or low-cholesterol egg substitutes. Dried beans, peas, lentils, and tofu.Seeds and most nuts. Dairy Low-fat or nonfat cheeses, including ricotta, string, and mozzarella. Skim or 1% milk that is liquid, powdered, or evaporated. Buttermilk that is made with low-fat milk. Nonfat or low-fat yogurt. Beverages Mineral water. Diet carbonated beverages. Sweets and Desserts Sherbets and fruit ices. Honey, jam, marmalade, jelly, and syrups. Meringues and gelatins. Pure sugar candy, such as hard candy, jelly beans, gumdrops, mints, marshmallows, and small amounts of dark chocolate. [employer] Eat all sweets and desserts in moderation. Fats and Oils Nonhydrogenated (trans-free) margarines. Vegetable oils, including soybean, sesame, sunflower, olive, peanut, safflower, corn, canola, and cottonseed. Salad dressings or mayonnaise that are made with a vegetable oil. Limit added fats and oils that you use for cooking, baking, salads, and as spreads. Other Cocoa powder. Coffee and tea. All seasonings and condiments. The items listed above may not be a complete list of recommended foods or beverages. Contact your dietitian for more options. WHAT FOODS ARE NOT RECOMMENDED? Grains Breads that are made with saturated or trans fats, oils, or whole milk. Croissants. Butter rolls. Cheese breads. Sweet rolls. Donuts. Buttered popcorn. Chow mein noodles. High-fat crackers,  such as cheese or butter crackers. Meats and Other Protein Sources Fatty meats, such as hotdogs, short ribs, sausage, spareribs, bacon, ribeye roast or steak, and mutton. High-fat deli meats, such as salami and bologna. Caviar. Domestic duck and goose. Organ meats, such as kidney, liver, sweetbreads, brains, gizzard, chitterlings, and heart. Dairy Cream, sour cream, cream cheese, and creamed cottage cheese. Whole milk cheeses, including blue (bleu), Monte[place] Jack, [place], [place], American, [name], [place], [place], [place], and [place]. Whole or 2% milk that is liquid, evaporated, or condensed. Whole buttermilk. Cream sauce or high-fat cheese sauce. Yogurt that is made from whole milk. Beverages Regular sodas and drinks with added sugar. Sweets and Desserts Frosting. Pudding. Cookies. Cakes other than angel food cake. Candy that has milk chocolate or white chocolate, hydrogenated fat, butter, coconut, or unknown ingredients. Buttered syrups. Full-fat ice cream or ice cream drinks. Fats and Oils Gravy that has suet, meat fat, or shortening. Cocoa butter, hydrogenated oils, palm oil, coconut oil, palm kernel oil. These can often be found in baked products, candy, fried foods, nondairy creamers, and whipped toppings. Solid fats and shortenings, including bacon fat, salt pork, lard, and butter. Nondairy cream substitutes, such as coffee creamers and sour cream substitutes. Salad dressings that are made of unknown oils, cheese, or sour cream. The items listed above may not be a complete list of foods and beverages to avoid. Contact your dietitian for more information.   This information is not intended to replace advice given to  you by your health care provider. Make sure you discuss any questions you have with your health care provider.   Document Released: 09/21/2008 Document Revised: 01/03/2015 Document Reviewed: 06/06/2014 Elsevier Interactive Patient Education [employer].

## 2016-07-12 ENCOUNTER — Other Ambulatory Visit: Payer: Medicare Other

## 2016-07-12 DIAGNOSIS — E039 Hypothyroidism, unspecified: Secondary | ICD-10-CM | POA: Diagnosis not present

## 2016-07-12 DIAGNOSIS — Z1159 Encounter for screening for other viral diseases: Secondary | ICD-10-CM

## 2016-07-12 DIAGNOSIS — I10 Essential (primary) hypertension: Secondary | ICD-10-CM

## 2016-07-12 LAB — CYTOLOGY - PAP

## 2016-07-13 LAB — COMPREHENSIVE METABOLIC PANEL
ALT: 13 U/L (ref 6–29)
AST: 16 U/L (ref 10–35)
Albumin: 3.7 g/dL (ref 3.6–5.1)
Alkaline Phosphatase: 72 U/L (ref 33–130)
BUN: 13 mg/dL (ref 7–25)
CO2: 23 mmol/L (ref 20–31)
Calcium: 8.8 mg/dL (ref 8.6–10.4)
Chloride: 105 mmol/L (ref 98–110)
Creat: 0.86 mg/dL (ref 0.50–0.99)
Glucose, Bld: 134 mg/dL — ABNORMAL HIGH (ref 65–99)
Potassium: 4.2 mmol/L (ref 3.5–5.3)
Sodium: 137 mmol/L (ref 135–146)
Total Bilirubin: 0.5 mg/dL (ref 0.2–1.2)
Total Protein: 7.3 g/dL (ref 6.1–8.1)

## 2016-07-13 LAB — LIPID PANEL
Cholesterol: 225 mg/dL — ABNORMAL HIGH (ref 125–200)
HDL: 55 mg/dL (ref >=46)
LDL Cholesterol: 134 mg/dL — ABNORMAL HIGH (ref <130)
Total CHOL/HDL Ratio: 4.1 Ratio (ref <=5.0)
Triglycerides: 180 mg/dL — ABNORMAL HIGH (ref <150)
VLDL: 36 mg/dL — ABNORMAL HIGH (ref <30)

## 2016-07-13 LAB — T4, FREE: Free T4: 1.7 ng/dL (ref 0.8–1.8)

## 2016-07-13 LAB — TSH: TSH: 0.36 mIU/L — ABNORMAL LOW

## 2016-07-13 LAB — HEPATITIS C ANTIBODY: HCV Ab: NEGATIVE

## 2016-07-14 ENCOUNTER — Other Ambulatory Visit: Payer: Self-pay | Admitting: Family Medicine

## 2016-07-14 ENCOUNTER — Telehealth: Payer: Self-pay | Admitting: Family Medicine

## 2016-07-14 DIAGNOSIS — R7309 Other abnormal glucose: Secondary | ICD-10-CM

## 2016-07-14 DIAGNOSIS — E039 Hypothyroidism, unspecified: Secondary | ICD-10-CM

## 2016-07-14 DIAGNOSIS — R739 Hyperglycemia, unspecified: Secondary | ICD-10-CM

## 2016-07-14 MED ORDER — LEVOTHYROXINE SODIUM 137 MCG PO TABS: 137.0000 ug | ORAL_TABLET | Freq: Every day | ORAL | Status: DC

## 2016-07-14 NOTE — Assessment & Plan Note (Signed)
Yearly monitoring TSH was low at 0.36. Changed synthroid to 137 from 150. F/u in 6 weeks.

## 2016-07-14 NOTE — Telephone Encounter (Signed)
Called patient to discuss lab results. Explained pap smear which was negative for lesions but positive for high risk HPV, which is new from last pap in 2014. Explained colpo procedure and scheduled appt for 7/27 at [facility] [place] [facility]. Informed pt of elevated lipids and fasting blood sugar. Explained that I have put in a future lab encounter to get her HgA1C drawn next week when she comes for colpo clinic. She has not been taking her atorvastatin bc it makes her legs hurt, will discuss this at her followup appt as well. Lastly, her TSH was low at 0.36. Decreased her synthroid from 150 to 147mg and sent to pharmacy. Pt voiced understanding.

## 2016-07-22 ENCOUNTER — Ambulatory Visit: Payer: Medicare Other

## 2016-07-26 ENCOUNTER — Ambulatory Visit (INDEPENDENT_AMBULATORY_CARE_PROVIDER_SITE_OTHER): Payer: Medicare Other | Admitting: Pharmacist

## 2016-07-26 DIAGNOSIS — Z7901 Long term (current) use of anticoagulants: Secondary | ICD-10-CM | POA: Diagnosis not present

## 2016-07-26 DIAGNOSIS — I480 Paroxysmal atrial fibrillation: Secondary | ICD-10-CM | POA: Diagnosis not present

## 2016-07-26 DIAGNOSIS — I4819 Other persistent atrial fibrillation: Secondary | ICD-10-CM

## 2016-07-26 DIAGNOSIS — I481 Persistent atrial fibrillation: Secondary | ICD-10-CM | POA: Diagnosis not present

## 2016-07-26 LAB — POCT INR: INR: 1.4

## 2016-08-05 ENCOUNTER — Ambulatory Visit: Payer: Medicare Other

## 2016-08-19 ENCOUNTER — Encounter: Payer: Self-pay | Admitting: Family Medicine

## 2016-08-19 ENCOUNTER — Ambulatory Visit (INDEPENDENT_AMBULATORY_CARE_PROVIDER_SITE_OTHER): Payer: Medicare Other | Admitting: Family Medicine

## 2016-08-19 VITALS — BP 122/64 | HR 77 | Temp 98.5°F | Wt 195.0 lb

## 2016-08-19 DIAGNOSIS — E785 Hyperlipidemia, unspecified: Secondary | ICD-10-CM | POA: Diagnosis not present

## 2016-08-19 DIAGNOSIS — R7303 Prediabetes: Secondary | ICD-10-CM

## 2016-08-19 DIAGNOSIS — R739 Hyperglycemia, unspecified: Secondary | ICD-10-CM

## 2016-08-19 DIAGNOSIS — R42 Dizziness and giddiness: Secondary | ICD-10-CM

## 2016-08-19 DIAGNOSIS — H6123 Impacted cerumen, bilateral: Secondary | ICD-10-CM | POA: Diagnosis not present

## 2016-08-19 DIAGNOSIS — H6120 Impacted cerumen, unspecified ear: Secondary | ICD-10-CM | POA: Insufficient documentation

## 2016-08-19 LAB — POCT HEMOGLOBIN: Hemoglobin: 12.8 g/dL (ref 12.2–16.2)

## 2016-08-19 LAB — POCT GLYCOSYLATED HEMOGLOBIN (HGB A1C): Hemoglobin A1C: 6.3

## 2016-08-19 NOTE — Assessment & Plan Note (Addendum)
Hemoglobin A1c elevated at 6.3 today, patient understands that 6.4 his diabetes, and will try to adapt and change her diet in order to decrease his number. Diabetic diet provided today in AVS. offered nutrition counseling with Dr. Jenne Campus, but patient declined at this time. Patient notes a family history of maternal diabetes as well.

## 2016-08-19 NOTE — Assessment & Plan Note (Signed)
Lipids elevated at a total cholesterol of 225, LDL of 137. Patient would qualify for a moderate intensity statin based on her cardiovascular risk. She was previously prescribed atorvastatin 80 mg, but she discontinued this as she was having muscle aches. Patient still has plenty of this medicine, and would be willing to try taking half the dose (40 mg for a moderate intensity statin). Patient counseled that if muscle aches persist, she may call and we could try rosuvastatin instead.

## 2016-08-19 NOTE — Assessment & Plan Note (Addendum)
Patient endorses dizziness that began about 10 days after decreasing her Synthroid dose, this being about 3 weeks ago. She awakens, takes her Synthroid, which about an hour, eats breakfast, and the dizziness usually abates about 30 minutes after eating. She notes that she prefers not to drive anywhere during the morning as she is afraid that she will have a dizzy spell. Counseled the patient that this could be affected by her cerumen bilateral cerumen impactions. Also counseled that this is an unusual side effect of decreasing Synthroid, and that it could be a coincidence. An additional possible etiology includes hypotension secondary to diltiazem, which she has been taking at the current dose at night for at least a year, counseled that she may also be able to take this after breakfast instead in order to try to avoid this symptom. Orthostatic vitals negative. Hemoglobin is normal at 12.8.

## 2016-08-19 NOTE — Patient Instructions (Signed)
It was a pleasure to see you today! I am happy that your hemoglobin A1c did not show diabetes! It was 6.3, and 6.4 is the level for diabetes. Please try to make some changes to your diet in order to avoid reaching that threshold. We will wash your ears out today to try to help with the earwax and the fullness. I am reassured by your dizziness is not caused by orthostatic hypotension, or your blood pressure dropping too low when you stand up. It could be caused by your diltiazem dropping your blood pressure a little bit, so you are welcome to try taking that after breakfast instead of at night. You may also feel less dizzy is your ears are less full. Your hemoglobin for your blood level, was normal. I will see you back in 6 months unless you need something sooner!   Best,  Dr. Lindell Noe  Diabetes Mellitus and Food It is important for you to manage your blood sugar (glucose) level. Your blood glucose level can be greatly affected by what you eat. Eating healthier foods in the appropriate amounts throughout the day at about the same time each day will help you control your blood glucose level. It can also help slow or prevent worsening of your diabetes mellitus. Healthy eating may even help you improve the level of your blood pressure and reach or maintain a healthy weight.  General recommendations for healthful eating and cooking habits include:  Eating meals and snacks regularly. Avoid going long periods of time without eating to lose weight.  Eating a diet that consists mainly of plant-based foods, such as fruits, vegetables, nuts, legumes, and whole grains.  Using low-heat cooking methods, such as baking, instead of high-heat cooking methods, such as deep frying. Work with your dietitian to make sure you understand how to use the Nutrition Facts information on food labels. HOW CAN FOOD AFFECT ME? Carbohydrates Carbohydrates affect your blood glucose level more than any other type of food. Your  dietitian will help you determine how many carbohydrates to eat at each meal and teach you how to count carbohydrates. Counting carbohydrates is important to keep your blood glucose at a healthy level, especially if you are using insulin or taking certain medicines for diabetes mellitus. Alcohol Alcohol can cause sudden decreases in blood glucose (hypoglycemia), especially if you use insulin or take certain medicines for diabetes mellitus. Hypoglycemia can be a life-threatening condition. Symptoms of hypoglycemia (sleepiness, dizziness, and disorientation) are similar to symptoms of having too much alcohol.  If your health care provider has given you approval to drink alcohol, do so in moderation and use the following guidelines:  Women should not have more than one drink per day, and men should not have more than two drinks per day. One drink is equal to:  12 oz of beer.  5 oz of wine.  1 oz of hard liquor.  Do not drink on an empty stomach.  Keep yourself hydrated. Have water, diet soda, or unsweetened iced tea.  Regular soda, juice, and other mixers might contain a lot of carbohydrates and should be counted. WHAT FOODS ARE NOT RECOMMENDED? As you make food choices, it is important to remember that all foods are not the same. Some foods have fewer nutrients per serving than other foods, even though they might have the same number of calories or carbohydrates. It is difficult to get your body what it needs when you eat foods with fewer nutrients. Examples of foods that you should  avoid that are high in calories and carbohydrates but low in nutrients include:  Trans fats (most processed foods list trans fats on the Nutrition Facts label).  Regular soda.  Juice.  Candy.  Sweets, such as cake, pie, doughnuts, and cookies.  Fried foods. WHAT FOODS CAN I EAT? Eat nutrient-rich foods, which will nourish your body and keep you healthy. The food you should eat also will depend on several  factors, including:  The calories you need.  The medicines you take.  Your weight.  Your blood glucose level.  Your blood pressure level.  Your cholesterol level. You should eat a variety of foods, including:  Protein.  Lean cuts of meat.  Proteins low in saturated fats, such as fish, egg whites, and beans. Avoid processed meats.  Fruits and vegetables.  Fruits and vegetables that may help control blood glucose levels, such as apples, mangoes, and yams.  Dairy products.  Choose fat-free or low-fat dairy products, such as milk, yogurt, and cheese.  Grains, bread, pasta, and rice.  Choose whole grain products, such as multigrain bread, whole oats, and brown rice. These foods may help control blood pressure.  Fats.  Foods containing healthful fats, such as nuts, avocado, olive oil, canola oil, and fish. DOES EVERYONE WITH DIABETES MELLITUS HAVE THE SAME MEAL PLAN? Because every person with diabetes mellitus is different, there is not one meal plan that works for everyone. It is very important that you meet with a dietitian who will help you create a meal plan that is just right for you.   This information is not intended to replace advice given to you by your health care provider. Make sure you discuss any questions you have with your health care provider.   Document Released: 09/09/2005 Document Revised: 01/03/2015 Document Reviewed: 11/09/2013 Elsevier Interactive Patient Education [employer].

## 2016-08-19 NOTE — Progress Notes (Signed)
HPI Mrs. Top is doing well today. She is here to discuss her hypothyroidism, hyperlipidemia, and an new symptom of dizziness. She also notes that her ears have been feeling full recently. In terms of her hyperlipidemia, she has not really been able to make any changes to her diet recently, as we discussed. She normally eats baked chicken, vegetables, portal vein, and the occasional red meat. She does not frequently add salt to her foods. She understands that she could do a little better in terms of her eating habits. She continues to be busy with her 67-year-old great grandson. She endorses muscle aches after taking her atorvastatin 80 mg, and these resolved after discontinuing this medicine.  In terms of her dizziness, this started about 10 days after decreasing her Synthroid dose. She notes that she takes her Synthroid on an empty stomach when she wakes up, and feels dizzy for about an hour afterwards. She eats breakfast about 1 hour after her Synthroid, and the dizziness often resolves within 30 minutes of eating breakfast. She does not check her blood sugar at home, and she has not lost any consciousness during any of these episodes. She does note that she prefers for her son to drive if she is going somewhere in the morning, because she is afraid that she will have a "dizzy spell".  CC: dizziness  CC, SH/smoking status, and VS noted  Objective: BP 122/64   Pulse 77   Temp 98.5 F (36.9 C) (Oral)   Wt 88.5 kg (195 lb)   SpO2 98%   BMI 34.54 kg/m  Gen: NAD, alert, cooperative, and pleasant overweight female. CV: RRR, no murmur Resp: CTAB, no wheezes, non-labored Neuro: Alert and oriented, Speech clear, No gross deficits  Assessment and plan: Prediabetes Hemoglobin A1c elevated at 6.3 today, patient understands that 6.4 his diabetes, and will try to adapt and change her diet in order to decrease his number. Diabetic diet provided today in AVS. offered nutrition counseling with Dr.  Jenne Campus, but patient declined at this time. Patient notes a family history of maternal diabetes as well.  HLD (hyperlipidemia) Lipids elevated at a total cholesterol of 225, LDL of 137. Patient would qualify for a moderate intensity statin based on her cardiovascular risk. She was previously prescribed atorvastatin 80 mg, but she discontinued this as she was having muscle aches. Patient still has plenty of this medicine, and would be willing to try taking half the dose (40 mg for a moderate intensity statin). Patient counseled that if muscle aches persist, she may call and we could try rosuvastatin instead.  Dizziness, nonspecific Patient endorses dizziness that began about 10 days after decreasing her Synthroid dose, this being about 3 weeks ago. She awakens, takes her Synthroid, which about an hour, eats breakfast, and the dizziness usually abates about 30 minutes after eating. She notes that she prefers not to drive anywhere during the morning as she is afraid that she will have a dizzy spell. Counseled the patient that this could be affected by her cerumen bilateral cerumen impactions. Also counseled that this is an unusual side effect of decreasing Synthroid, and that it could be a coincidence. An additional possible etiology includes hypotension secondary to diltiazem, which she has been taking at the current dose at night for at least a year, counseled that she may also be able to take this after breakfast instead in order to try to avoid this symptom. Orthostatic vitals negative. Hemoglobin is normal at 12.8.   Cerumen impaction  This is a recurrent problem for Mrs. Ronnald Ramp. She uses Debrox drops at home, but this has not been helping recently. She notes fullness in both ears. Copious cerumen in both ear canals. We will wash out today in clinic.   Orders Placed This Encounter  Procedures  . Hemoglobin    Ralene Ok, MD, PGY1 08/19/2016 2:45 PM

## 2016-08-19 NOTE — Assessment & Plan Note (Signed)
This is a recurrent problem for [name]. She uses Debrox drops at home, but this has not been helping recently. She notes fullness in both ears. Copious cerumen in both ear canals. We will wash out today in clinic.

## 2016-08-24 DIAGNOSIS — H2513 Age-related nuclear cataract, bilateral: Secondary | ICD-10-CM | POA: Diagnosis not present

## 2016-08-24 DIAGNOSIS — H5203 Hypermetropia, bilateral: Secondary | ICD-10-CM | POA: Diagnosis not present

## 2016-08-24 DIAGNOSIS — H40013 Open angle with borderline findings, low risk, bilateral: Secondary | ICD-10-CM | POA: Diagnosis not present

## 2016-08-24 DIAGNOSIS — H524 Presbyopia: Secondary | ICD-10-CM | POA: Diagnosis not present

## 2016-08-24 DIAGNOSIS — H52222 Regular astigmatism, left eye: Secondary | ICD-10-CM | POA: Diagnosis not present

## 2016-08-26 ENCOUNTER — Ambulatory Visit: Payer: Medicare Other

## 2016-09-07 ENCOUNTER — Other Ambulatory Visit: Payer: Self-pay | Admitting: Family Medicine

## 2016-09-07 DIAGNOSIS — E039 Hypothyroidism, unspecified: Secondary | ICD-10-CM

## 2016-09-09 ENCOUNTER — Other Ambulatory Visit: Payer: Self-pay | Admitting: Family Medicine

## 2016-09-29 DIAGNOSIS — Z8551 Personal history of malignant neoplasm of bladder: Secondary | ICD-10-CM | POA: Diagnosis not present

## 2016-11-29 ENCOUNTER — Ambulatory Visit (INDEPENDENT_AMBULATORY_CARE_PROVIDER_SITE_OTHER): Payer: Medicare Other | Admitting: Pharmacist Clinician (PhC)/ Clinical Pharmacy Specialist

## 2016-11-29 DIAGNOSIS — Z7901 Long term (current) use of anticoagulants: Secondary | ICD-10-CM | POA: Diagnosis not present

## 2016-11-29 DIAGNOSIS — I481 Persistent atrial fibrillation: Secondary | ICD-10-CM | POA: Diagnosis not present

## 2016-11-29 DIAGNOSIS — I480 Paroxysmal atrial fibrillation: Secondary | ICD-10-CM | POA: Diagnosis not present

## 2016-11-29 DIAGNOSIS — I4819 Other persistent atrial fibrillation: Secondary | ICD-10-CM

## 2016-11-29 LAB — POCT INR: INR: 1.4

## 2016-12-02 ENCOUNTER — Ambulatory Visit (INDEPENDENT_AMBULATORY_CARE_PROVIDER_SITE_OTHER): Payer: Medicare Other | Admitting: Internal Medicine

## 2016-12-02 VITALS — BP 118/64 | HR 77 | Temp 97.9°F | Ht 63.0 in | Wt 198.2 lb

## 2016-12-02 DIAGNOSIS — G8929 Other chronic pain: Secondary | ICD-10-CM | POA: Diagnosis not present

## 2016-12-02 DIAGNOSIS — M5441 Lumbago with sciatica, right side: Secondary | ICD-10-CM | POA: Diagnosis not present

## 2016-12-02 MED ORDER — HYDROCODONE-ACETAMINOPHEN 5-325 MG PO TABS: 1.0000 | ORAL_TABLET | ORAL | 0 refills | Status: DC | PRN

## 2016-12-02 MED ORDER — CYCLOBENZAPRINE HCL 10 MG PO TABS: 5.0000 mg | ORAL_TABLET | Freq: Three times a day (TID) | ORAL | 1 refills | Status: DC | PRN

## 2016-12-02 NOTE — Progress Notes (Signed)
[facility] Progress Note  Subjective:  [name] is a 65 y.o. female with history of chronic back pain who presents for worsening pain for the past 3 days. She has had 1 other flare up this year, which improved with valium and muscle relaxant. She cannot take NSAIDs because she is on coumadin for afib. She has known DDD at L2-3, L3-4, and L4-5 with grade 1 anterior slip on last imaging, which was xray of lumbar spine 02/2015. She has been helping her son after hip surgery and says she has been helping reposition him in bed this past week. Pain is a constant soreness of lower back, R > L, with occasional sharp twinges with lateral flexion. Pain improved by leaning somewhat forward. She has not tried PT yet. ROS: No bowel or bladder incontinence, no recent falls   Chief Complaint  Patient presents with  . Back Pain   Allergies  Allergen Reactions  . Penicillins Itching  . Percocet [Oxycodone-Acetaminophen] Itching   Social: Former smoker  Objective: Blood pressure 118/64, pulse 77, temperature 97.9 F (36.6 C), temperature source Oral, height '5\' 3"'$  (1.6 m), weight 198 lb 3.2 oz (89.9 kg), SpO2 97 %. Body mass index is 35.11 kg/m. Constitutional: Obese female, in NAD Cardiovascular: RRR, S1, S2, no m/r/g.  Pulmonary/Chest: Effort normal and breath sounds normal. No respiratory distress.  Abdominal: Soft. +BS, NT, ND, no rebound or guarding.  Musculoskeletal: TTP over lumbar paraspinal muscles. Pain with straight leg raise bilaterally, radiating to R lower back and R hip.  Neurological: AOx3, no focal deficits. Psychiatric: Normal mood and affect.  Vitals reviewed  Assessment/Plan: Back pain - Acute on chronic back pain, likely exacerbated from increased lifting helping her son.  - Prescribed flexeril and norco (#20) prn for acute muscle strain - Gave handout on back exercises - Would benefit from weight loss and PT. Recommended discussing further with PCP once  acute back pain episode resolved.  Follow-up next week for coumadin check and re-assessment of back pain.  Olene Floss, MD [place], PGY-2

## 2016-12-02 NOTE — Patient Instructions (Signed)
[name],  I have prescribed flexeril and norco for pain to take as needed.   While you are hurting, the goal is not to stay in bed all day to avoid getting stiff.  Once you are feeling better, I recommend following up with your primary doctor and discussing back exercises and long-term ways to reduce flare-ups of back pain, including physical therapy.  Best, Dr. Ola Spurr  Back Exercises Introduction If you have pain in your back, do these exercises 2-3 times each day or as told by your doctor. When the pain goes away, do the exercises once each day, but repeat the steps more times for each exercise (do more repetitions). If you do not have pain in your back, do these exercises once each day or as told by your doctor. Exercises Single Knee to Chest  Do these steps 3-5 times in a row for each leg: 1. Lie on your back on a firm bed or the floor with your legs stretched out. 2. Bring one knee to your chest. 3. Hold your knee to your chest by grabbing your knee or thigh. 4. Pull on your knee until you feel a gentle stretch in your lower back. 5. Keep doing the stretch for 10-30 seconds. 6. Slowly let go of your leg and straighten it. Pelvic Tilt  Do these steps 5-10 times in a row: 1. Lie on your back on a firm bed or the floor with your legs stretched out. 2. Bend your knees so they point up to the ceiling. Your feet should be flat on the floor. 3. Tighten your lower belly (abdomen) muscles to press your lower back against the floor. This will make your tailbone point up to the ceiling instead of pointing down to your feet or the floor. 4. Stay in this position for 5-10 seconds while you gently tighten your muscles and breathe evenly. Cat-Cow  Do these steps until your lower back bends more easily: 1. Get on your hands and knees on a firm surface. Keep your hands under your shoulders, and keep your knees under your hips. You may put padding under your knees. 2. Let your head hang  down, and make your tailbone point down to the floor so your lower back is round like the back of a cat. 3. Stay in this position for 5 seconds. 4. Slowly lift your head and make your tailbone point up to the ceiling so your back hangs low (sags) like the back of a cow. 5. Stay in this position for 5 seconds. Press-Ups  Do these steps 5-10 times in a row: 1. Lie on your belly (face-down) on the floor. 2. Place your hands near your head, about shoulder-width apart. 3. While you keep your back relaxed and keep your hips on the floor, slowly straighten your arms to raise the top half of your body and lift your shoulders. Do not use your back muscles. To make yourself more comfortable, you may change where you place your hands. 4. Stay in this position for 5 seconds. 5. Slowly return to lying flat on the floor. Bridges  Do these steps 10 times in a row: 1. Lie on your back on a firm surface. 2. Bend your knees so they point up to the ceiling. Your feet should be flat on the floor. 3. Tighten your butt muscles and lift your butt off of the floor until your waist is almost as high as your knees. If you do not feel the muscles working  in your butt and the back of your thighs, slide your feet 1-2 inches farther away from your butt. 4. Stay in this position for 3-5 seconds. 5. Slowly lower your butt to the floor, and let your butt muscles relax. If this exercise is too easy, try doing it with your arms crossed over your chest. Belly Crunches  Do these steps 5-10 times in a row: 1. Lie on your back on a firm bed or the floor with your legs stretched out. 2. Bend your knees so they point up to the ceiling. Your feet should be flat on the floor. 3. Cross your arms over your chest. 4. Tip your chin a little bit toward your chest but do not bend your neck. 5. Tighten your belly muscles and slowly raise your chest just enough to lift your shoulder blades a tiny bit off of the floor. 6. Slowly lower your  chest and your head to the floor. Back Lifts  Do these steps 5-10 times in a row: 1. Lie on your belly (face-down) with your arms at your sides, and rest your forehead on the floor. 2. Tighten the muscles in your legs and your butt. 3. Slowly lift your chest off of the floor while you keep your hips on the floor. Keep the back of your head in line with the curve in your back. Look at the floor while you do this. 4. Stay in this position for 3-5 seconds. 5. Slowly lower your chest and your face to the floor. Contact a doctor if:  Your back pain gets a lot worse when you do an exercise.  Your back pain does not lessen 2 hours after you exercise. If you have any of these problems, stop doing the exercises. Do not do them again unless your doctor says it is okay. Get help right away if:  You have sudden, very bad back pain. If this happens, stop doing the exercises. Do not do them again unless your doctor says it is okay. This information is not intended to replace advice given to you by your health care provider. Make sure you discuss any questions you have with your health care provider. Document Released: 01/15/2011 Document Revised: 05/20/2016 Document Reviewed: 02/06/2015  2017 Elsevier

## 2016-12-04 ENCOUNTER — Encounter: Payer: Self-pay | Admitting: Internal Medicine

## 2016-12-04 NOTE — Assessment & Plan Note (Addendum)
-   Acute on chronic back pain, likely exacerbated from increased lifting helping her son.  - Prescribed flexeril and norco (#20) prn for acute muscle strain - Gave handout on back exercises - Would benefit from weight loss and PT. Recommended discussing further with PCP once acute back pain episode resolved.

## 2016-12-07 ENCOUNTER — Other Ambulatory Visit: Payer: Self-pay | Admitting: Family Medicine

## 2016-12-07 NOTE — Telephone Encounter (Signed)
This patient's coumadin is managed by cardiology. How do I get the pharmacy to send the refill request to them instead? Dr. Percival Spanish is who it was listed under in the INR order from 11/29/16

## 2016-12-14 ENCOUNTER — Ambulatory Visit (INDEPENDENT_AMBULATORY_CARE_PROVIDER_SITE_OTHER): Payer: Medicare Other | Admitting: Pharmacist

## 2016-12-14 DIAGNOSIS — I481 Persistent atrial fibrillation: Secondary | ICD-10-CM | POA: Diagnosis not present

## 2016-12-14 DIAGNOSIS — Z7901 Long term (current) use of anticoagulants: Secondary | ICD-10-CM

## 2016-12-14 DIAGNOSIS — I4819 Other persistent atrial fibrillation: Secondary | ICD-10-CM

## 2016-12-14 DIAGNOSIS — I480 Paroxysmal atrial fibrillation: Secondary | ICD-10-CM | POA: Diagnosis not present

## 2016-12-14 LAB — POCT INR: INR: 2.4

## 2016-12-24 DIAGNOSIS — H04123 Dry eye syndrome of bilateral lacrimal glands: Secondary | ICD-10-CM | POA: Diagnosis not present

## 2016-12-24 DIAGNOSIS — H2513 Age-related nuclear cataract, bilateral: Secondary | ICD-10-CM | POA: Diagnosis not present

## 2017-01-07 ENCOUNTER — Ambulatory Visit (INDEPENDENT_AMBULATORY_CARE_PROVIDER_SITE_OTHER): Payer: Medicare Other | Admitting: Pharmacist

## 2017-01-07 ENCOUNTER — Other Ambulatory Visit: Payer: Self-pay | Admitting: Cardiology

## 2017-01-07 DIAGNOSIS — I4819 Other persistent atrial fibrillation: Secondary | ICD-10-CM

## 2017-01-07 DIAGNOSIS — Z7901 Long term (current) use of anticoagulants: Secondary | ICD-10-CM

## 2017-01-07 DIAGNOSIS — I481 Persistent atrial fibrillation: Secondary | ICD-10-CM | POA: Diagnosis not present

## 2017-01-07 DIAGNOSIS — I480 Paroxysmal atrial fibrillation: Secondary | ICD-10-CM

## 2017-01-07 LAB — POCT INR: INR: 2.8

## 2017-01-07 MED ORDER — WARFARIN SODIUM 5 MG PO TABS: ORAL_TABLET | ORAL | 1 refills | Status: DC

## 2017-01-08 ENCOUNTER — Other Ambulatory Visit: Payer: Self-pay | Admitting: Cardiology

## 2017-01-10 NOTE — Telephone Encounter (Signed)
Rx(s) sent to pharmacy electronically.  

## 2017-01-24 ENCOUNTER — Encounter (HOSPITAL_COMMUNITY): Payer: Self-pay | Admitting: *Deleted

## 2017-01-24 ENCOUNTER — Ambulatory Visit (HOSPITAL_COMMUNITY)
Admission: EM | Admit: 2017-01-24 | Discharge: 2017-01-24 | Disposition: A | Discharge status: Home / Self Care | Payer: Medicare Other | Attending: Family Medicine | Admitting: Family Medicine

## 2017-01-24 DIAGNOSIS — J40 Bronchitis, not specified as acute or chronic: Secondary | ICD-10-CM | POA: Diagnosis not present

## 2017-01-24 MED ORDER — HYDROCODONE-HOMATROPINE 5-1.5 MG/5ML PO SYRP: 5.0000 mL | ORAL_SOLUTION | Freq: Four times a day (QID) | ORAL | 0 refills | Status: DC | PRN

## 2017-01-24 MED ORDER — ALBUTEROL SULFATE HFA 108 (90 BASE) MCG/ACT IN AERS: 2.0000 | INHALATION_SPRAY | RESPIRATORY_TRACT | 1 refills | Status: DC | PRN

## 2017-01-24 NOTE — ED Provider Notes (Signed)
[name]    CSN: [id number] Arrival date & time: 01/24/17  1514     History   Chief Complaint Chief Complaint  Patient presents with  . Cough    HPI [name] is a 66 y.o. female.   This is a 66 year old woman who presents to the most [facility] urgent care center with 1 week of cough which is nonproductive.  She is a nonsmoker and has never had asthma. She's had no hemoptysis or fever. She notes that the cough is worse at night.  Patient is retired. She's on Coumadin for chronic atrial fibrillation.      Past Medical History:  Diagnosis Date  . Anticoagulated on Coumadin   . Arthritis    wrist, hands -  . Atrial fibrillation, persistent ([place])   . Bladder tumor   . CAD (coronary artery disease) cardiologist-  dr hochrien   a. PTCA to RCA (02/ 1999);  b.  Myoview 5/10:  EF 71%, no ischemia  . Full dentures   . GERD (gastroesophageal reflux disease)   . History of colon polyps    2005  hyperplastic  . History of MI (myocardial infarction)    1999  . HLD (hyperlipidemia)   . Hypertension   . Hypothyroidism   . Prediabetes   . SUI (stress urinary incontinence, female)   . Wears glasses   . Wears glasses     Patient Active Problem List   Diagnosis Date Noted  . Dizziness, nonspecific 08/19/2016  . Cerumen impaction 08/19/2016  . Acute upper respiratory infection 12/27/2015  . Hearing loss due to cerumen impaction 12/27/2015  . Back pain 10/20/2015  . Ethmoid sinusitis 09/22/2015  . Near syncope 09/11/2015  . Atrial fibrillation with RVR ([place]) 09/11/2015  . Abnormal vaginal bleeding 05/06/2015  . Hematuria 05/06/2015  . Disease of middle ear or mastoid 04/10/2015  . Long-term (current) use of anticoagulants 02/19/2015  . Long QT interval 11/08/2014  . Bradycardia, sinus   . Family history of colon cancer - mother in 71's 06/04/2014  . Prediabetes 04/11/2014  . Atrial fibrillation ([place]) 12/18/2013  . Depression, major  04/19/2012  . Colon polyps 05/06/2011  . Postmenopausal bleeding 05/06/2011  . DEGENERATIVE JOINT DISEASE, LEFT HIP 07/20/2010  . HLD (hyperlipidemia) 04/29/2009  . OBESITY, UNSPECIFIED 04/29/2009  . Essential hypertension, benign 04/29/2009  . Coronary atherosclerosis 04/29/2009  . Hypothyroidism 02/23/2007  . MYOCARDIAL INFARCTION, OLD 02/23/2007  . Personal history of colonic adenoma 11/11/2004    Past Surgical History:  Procedure Laterality Date  . CARDIOVASCULAR STRESS TEST  05-01-2009   dr hochrein   normal nuclear study/  no ischemia or scar/  normal LV function and wall motion, ef 71%  . CORONARY ANGIOPLASTY  Feb 1999   PTCA to RCA  . D & C HYSTEROSCOPY/  POLYPECTOMY  05-08-2009  . HYSTEROSCOPY W/D&C  12/07/2012   Procedure: DILATATION AND CURETTAGE /HYSTEROSCOPY;  Surgeon: Elveria Royals, MD;  Location: [facility];  Service: Gynecology;  Laterality: N/A;  Dilitation and curettage /hysteroscopy/biopsey of lower segment of uterus  . TOTAL HIP ARTHROPLASTY Left 12-11-2010  . TRANSTHORACIC ECHOCARDIOGRAM  09-11-2015   mild LVH, ef 55%,  mildly reduced RVSF,  trivial TR  . TRANSURETHRAL RESECTION OF BLADDER TUMOR N/A 12/01/2015   Procedure: TRANSURETHRAL RESECTION OF BLADDER TUMOR (TURBT);  Surgeon: Kathie Rhodes, MD;  Location: [facility];  Service: Urology;  Laterality: N/A;  . TUBAL LIGATION  1974    OB  History    No data available       Home Medications    Prior to Admission medications   Medication Sig Start Date End Date Taking? Authorizing Provider  albuterol (PROVENTIL HFA;VENTOLIN HFA) 108 (90 Base) MCG/ACT inhaler Inhale 2 puffs into the lungs every 4 (four) hours as needed for wheezing or shortness of breath (cough, shortness of breath or wheezing.). 01/24/17   Robyn Haber, MD  atorvastatin (LIPITOR) 80 MG tablet TAKE 1 TABLET(80 MG) BY MOUTH DAILY 02/10/16   Minus Breeding, MD  calcium carbonate (TUMS - DOSED IN MG ELEMENTAL CALCIUM) 500 MG  chewable tablet Chew 1 tablet by mouth as needed for indigestion or heartburn.    Historical Provider, MD  carbamide peroxide (DEBROX) 6.5 % otic solution Place 5 drops into both ears 2 (two) times daily. 12/26/15   Alyssa A Haney, MD  CARTIA XT 300 MG 24 hr capsule TAKE 1 CAPSULE(300 MG) BY MOUTH DAILY 01/10/17   Minus Breeding, MD  cyclobenzaprine (FLEXERIL) 10 MG tablet Take 0.5-1 tablets (5-10 mg total) by mouth 3 (three) times daily as needed for muscle spasms. 12/02/16   Hillary Corinda Gubler, MD  fluticasone (FLONASE) 50 MCG/ACT nasal spray Place 2 sprays into both nostrils daily. 11/08/14   Rosemarie Ax, MD  HYDROcodone-acetaminophen (NORCO/VICODIN) 5-325 MG tablet Take 1-2 tablets by mouth every 4 (four) hours as needed for moderate pain. 12/02/16   Hillary Corinda Gubler, MD  HYDROcodone-homatropine (HYDROMET) 5-1.5 MG/5ML syrup Take 5 mLs by mouth every 6 (six) hours as needed for cough. 01/24/17   Robyn Haber, MD  levothyroxine (SYNTHROID, LEVOTHROID) 137 MCG tablet TAKE 1 TABLET BY MOUTH DAILY BEFORE BREAKFAST. 07/14/16   Sela Hilding, MD  levothyroxine (SYNTHROID, LEVOTHROID) 137 MCG tablet TAKE 1 TABLET BY MOUTH DAILY BEFORE BREAKFAST. 09/07/16   Sela Hilding, MD  nitroGLYCERIN (NITROSTAT) 0.4 MG SL tablet PLACE 1 TABLET UNDER TONGUE EVERY 5 MINUTES AS NEEDED FOR CHEST PAIN 09/15/15   Rosemarie Ax, MD  warfarin (COUMADIN) 5 MG tablet TAKE 1 TO 1 AND 1/2 TABLETS BY MOUTH EVERY EVENING AS DIRECTED BY COUMADIN CLINIC 01/11/17   Minus Breeding, MD    Family History Family History  Problem Relation Age of Onset  . Diabetes Mother   . Heart disease Mother   . Colon cancer Mother 34  . Heart disease Father   . Cancer Sister     Lung  . Cancer Brother     intestinal  . Cancer Brother     throat ca  . Cancer Brother     bladder    Social History Social History  Substance Use Topics  . Smoking status: Former Smoker    Packs/day: 1.50    Years: 15.00     Types: Cigarettes    Quit date: 11/09/1981  . Smokeless tobacco: Never Used  . Alcohol use No     Allergies   Penicillins and Percocet [oxycodone-acetaminophen]   Review of Systems Review of Systems  Constitutional: Positive for fatigue.  HENT: Positive for rhinorrhea.   Respiratory: Positive for cough.   Gastrointestinal: Negative.   Genitourinary: Negative.      Physical Exam Triage Vital Signs ED Triage Vitals [01/24/17 1611]  Enc Vitals Group     BP 149/79     Pulse Rate 97     Resp 20     Temp 98.3 F (36.8 C)     Temp Source Oral     SpO2 97 %  Weight      Height      Head Circumference      Peak Flow      Pain Score      Pain Loc      Pain Edu?      Excl. in [place]?    No data found.   Updated Vital Signs BP 149/79 (BP Location: Right Arm)   Pulse 97   Temp 98.3 F (36.8 C) (Oral)   Resp 20   SpO2 97%    Physical Exam  Constitutional: She is oriented to person, place, and time. She appears well-developed and well-nourished.  HENT:  Head: Normocephalic.  Right Ear: External ear normal.  Left Ear: External ear normal.  Mouth/Throat: Oropharynx is clear and moist.  Eyes: Conjunctivae are normal. Pupils are equal, round, and reactive to light.  Neck: Normal range of motion. Neck supple.  Cardiovascular: Normal rate.   Irregularly irregular rhythm.  Pulmonary/Chest: Effort normal. She has wheezes.  Musculoskeletal: Normal range of motion.  Neurological: She is alert and oriented to person, place, and time.  Skin: Skin is warm and dry.  Nursing note and vitals reviewed.    UC Treatments / Results  Labs (all labs ordered are listed, but only abnormal results are displayed) Labs Reviewed - No data to display  EKG  EKG Interpretation None       Radiology No results found.  Procedures Procedures (including critical care time)  Medications Ordered in UC Medications - No data to display   Initial Impression / Assessment and Plan  / UC Course  I have reviewed the triage vital signs and the nursing notes.  Pertinent labs & imaging results that were available during my care of the patient were reviewed by me and considered in my medical decision making (see chart for details).     Final Clinical Impressions(s) / UC Diagnoses   Final diagnoses:  Bronchitis    New Prescriptions New Prescriptions   ALBUTEROL (PROVENTIL HFA;VENTOLIN HFA) 108 (90 BASE) MCG/ACT INHALER    Inhale 2 puffs into the lungs every 4 (four) hours as needed for wheezing or shortness of breath (cough, shortness of breath or wheezing.).   HYDROCODONE-HOMATROPINE (HYDROMET) 5-1.5 MG/5ML SYRUP    Take 5 mLs by mouth every 6 (six) hours as needed for cough.     Robyn Haber, MD 01/24/17 [phone]

## 2017-01-24 NOTE — ED Triage Notes (Signed)
Pt  Developed  Runny  Nose   Cough   Nasal  Drainage  And  Cough   With  Symptoms  X  1   Week  Cough  Is  Non  Productive

## 2017-01-24 NOTE — Discharge Instructions (Signed)
The antibiotic that I was going to write cannot be taken with coumadin.  Therefore, I am writing for an inhaler and cough medicine.  If you are not improving in 2 days, please return.

## 2017-01-26 ENCOUNTER — Ambulatory Visit (INDEPENDENT_AMBULATORY_CARE_PROVIDER_SITE_OTHER): Payer: Medicare Other | Admitting: Student

## 2017-01-26 VITALS — BP 130/78 | HR 74 | Temp 98.8°F | Wt 192.0 lb

## 2017-01-26 DIAGNOSIS — B9789 Other viral agents as the cause of diseases classified elsewhere: Secondary | ICD-10-CM

## 2017-01-26 DIAGNOSIS — J069 Acute upper respiratory infection, unspecified: Secondary | ICD-10-CM

## 2017-01-26 NOTE — Patient Instructions (Addendum)
Follow up as needed Take Alka Seltzer cold and flu as for cold symptoms You may continue the Hydromet that was given to you by urgent care If you develop fevers or worsening symptoms, call the office at [phone] to make an appointment

## 2017-01-26 NOTE — Progress Notes (Signed)
   Subjective:    Patient ID: [name], female    DOB: 12-05-1951, 66 y.o.   MRN: [id number]   CC: concern for flu  HPI: 66 y/o F with concern for flu  Concern for flu - started to have cough and congestion 10 days ago - denies SOB, or fevers - went to urgent care 2 days ago and was given albuterol and hydromet for bronchitis - denies ear pain, sore throat, N/V/D - her husband was sick with sinusitis recently but no other sick contacts  Smoking status reviewed  Review of Systems  Per HPI else denies chest pain, abdominal pain    Objective:  BP 130/78   Pulse 74   Temp 98.8 F (37.1 C) (Oral)   Wt 192 lb (87.1 kg)   SpO2 94%   BMI 34.01 kg/m  Vitals and nursing note reviewed  General: NAD HEENT: Normal oropharynx without erythema, lesions, exudates, no cervical lymphadenopathy palpated, bilateral ear canals with copious cerumen, no tenderness over frontal or maxillary sinuses  Cardiac: RRR, normal heart sounds Respiratory: CTAB, normal effort Skin: warm and dry, no rashes noted Neuro: alert and oriented   Assessment & Plan:    Upper respiratory tract infection History and physical exam consistent with viral upper respiratory tract infection. No red flag symptoms on history or physical exam - Continue conservative measures with over-the-counter cold and flu remedies - Continue prescribed Hydromet and albuterol - Return precautions discussed    Boyde Grieco A. Lincoln Brigham MD, [facility] Resident PGY-3 Pager [phone]

## 2017-01-26 NOTE — Assessment & Plan Note (Signed)
History and physical exam consistent with viral upper respiratory tract infection. No red flag symptoms on history or physical exam - Continue conservative measures with over-the-counter cold and flu remedies - Continue prescribed Hydromet and albuterol - Return precautions discussed

## 2017-02-02 ENCOUNTER — Encounter (HOSPITAL_COMMUNITY): Payer: Self-pay | Admitting: Family Medicine

## 2017-02-02 ENCOUNTER — Ambulatory Visit (HOSPITAL_COMMUNITY)
Admission: EM | Admit: 2017-02-02 | Discharge: 2017-02-02 | Disposition: A | Discharge status: Home / Self Care | Payer: Medicare Other | Attending: Family Medicine | Admitting: Family Medicine

## 2017-02-02 ENCOUNTER — Ambulatory Visit (INDEPENDENT_AMBULATORY_CARE_PROVIDER_SITE_OTHER): Payer: Medicare Other

## 2017-02-02 DIAGNOSIS — J0100 Acute maxillary sinusitis, unspecified: Secondary | ICD-10-CM

## 2017-02-02 DIAGNOSIS — H6123 Impacted cerumen, bilateral: Secondary | ICD-10-CM | POA: Diagnosis not present

## 2017-02-02 DIAGNOSIS — J40 Bronchitis, not specified as acute or chronic: Secondary | ICD-10-CM

## 2017-02-02 DIAGNOSIS — R05 Cough: Secondary | ICD-10-CM | POA: Diagnosis not present

## 2017-02-02 MED ORDER — PREDNISONE 20 MG PO TABS: ORAL_TABLET | ORAL | 0 refills | Status: DC

## 2017-02-02 MED ORDER — DOXYCYCLINE HYCLATE 100 MG PO TABS: 100.0000 mg | ORAL_TABLET | Freq: Two times a day (BID) | ORAL | 0 refills | Status: DC

## 2017-02-02 MED ORDER — HYDROCODONE-HOMATROPINE 5-1.5 MG/5ML PO SYRP: 5.0000 mL | ORAL_SOLUTION | Freq: Four times a day (QID) | ORAL | 0 refills | Status: DC | PRN

## 2017-02-02 NOTE — ED Triage Notes (Signed)
The patient presented to the [facility] with a complaint of cough and congestion x 3 weeks.

## 2017-02-02 NOTE — ED Provider Notes (Addendum)
[place]    CSN: [id number] Arrival date & time: 02/02/17  1139     History   Chief Complaint Chief Complaint  Patient presents with  . Cough    HPI [name] is a 66 y.o. female.   This is 66 year old woman who presents with symptoms of upper respiratory tract infection with cough, hearing loss, and fatigue despite inhaler and antitussive measure.  She is now exhausted after three weeks of coughing.      Past Medical History:  Diagnosis Date  . Anticoagulated on Coumadin   . Arthritis    wrist, hands -  . Atrial fibrillation, persistent ([place])   . Bladder tumor   . CAD (coronary artery disease) cardiologist-  dr hochrien   a. PTCA to RCA (02/ 1999);  b.  Myoview 5/10:  EF 71%, no ischemia  . Full dentures   . GERD (gastroesophageal reflux disease)   . History of colon polyps    2005  hyperplastic  . History of MI (myocardial infarction)    1999  . HLD (hyperlipidemia)   . Hypertension   . Hypothyroidism   . Prediabetes   . SUI (stress urinary incontinence, female)   . Wears glasses   . Wears glasses     Patient Active Problem List   Diagnosis Date Noted  . Upper respiratory tract infection 01/26/2017  . Dizziness, nonspecific 08/19/2016  . Cerumen impaction 08/19/2016  . Hearing loss due to cerumen impaction 12/27/2015  . Back pain 10/20/2015  . Ethmoid sinusitis 09/22/2015  . Near syncope 09/11/2015  . Atrial fibrillation with RVR ([place]) 09/11/2015  . Abnormal vaginal bleeding 05/06/2015  . Hematuria 05/06/2015  . Disease of middle ear or mastoid 04/10/2015  . Long-term (current) use of anticoagulants 02/19/2015  . Long QT interval 11/08/2014  . Bradycardia, sinus   . Family history of colon cancer - mother in 35's 06/04/2014  . Prediabetes 04/11/2014  . Atrial fibrillation ([place]) 12/18/2013  . Depression, major 04/19/2012  . Colon polyps 05/06/2011  . Postmenopausal bleeding 05/06/2011  . DEGENERATIVE JOINT DISEASE, LEFT HIP  07/20/2010  . HLD (hyperlipidemia) 04/29/2009  . OBESITY, UNSPECIFIED 04/29/2009  . Essential hypertension, benign 04/29/2009  . Coronary atherosclerosis 04/29/2009  . Hypothyroidism 02/23/2007  . MYOCARDIAL INFARCTION, OLD 02/23/2007  . Personal history of colonic adenoma 11/11/2004    Past Surgical History:  Procedure Laterality Date  . CARDIOVASCULAR STRESS TEST  05-01-2009   dr hochrein   normal nuclear study/  no ischemia or scar/  normal LV function and wall motion, ef 71%  . CORONARY ANGIOPLASTY  Feb 1999   PTCA to RCA  . D & C HYSTEROSCOPY/  POLYPECTOMY  05-08-2009  . HYSTEROSCOPY W/D&C  12/07/2012   Procedure: DILATATION AND CURETTAGE /HYSTEROSCOPY;  Surgeon: Elveria Royals, MD;  Location: [facility];  Service: Gynecology;  Laterality: N/A;  Dilitation and curettage /hysteroscopy/biopsey of lower segment of uterus  . TOTAL HIP ARTHROPLASTY Left 12-11-2010  . TRANSTHORACIC ECHOCARDIOGRAM  09-11-2015   mild LVH, ef 55%,  mildly reduced RVSF,  trivial TR  . TRANSURETHRAL RESECTION OF BLADDER TUMOR N/A 12/01/2015   Procedure: TRANSURETHRAL RESECTION OF BLADDER TUMOR (TURBT);  Surgeon: Kathie Rhodes, MD;  Location: [facility];  Service: Urology;  Laterality: N/A;  . TUBAL LIGATION  1974    OB History    No data available       Home Medications    Prior to Admission medications   Medication Sig Start  Date End Date Taking? Authorizing Provider  albuterol (PROVENTIL HFA;VENTOLIN HFA) 108 (90 Base) MCG/ACT inhaler Inhale 2 puffs into the lungs every 4 (four) hours as needed for wheezing or shortness of breath (cough, shortness of breath or wheezing.). 01/24/17   Robyn Haber, MD  atorvastatin (LIPITOR) 80 MG tablet TAKE 1 TABLET(80 MG) BY MOUTH DAILY 02/10/16   Minus Breeding, MD  calcium carbonate (TUMS - DOSED IN MG ELEMENTAL CALCIUM) 500 MG chewable tablet Chew 1 tablet by mouth as needed for indigestion or heartburn.    Historical Provider, MD  carbamide  peroxide (DEBROX) 6.5 % otic solution Place 5 drops into both ears 2 (two) times daily. 12/26/15   Alyssa A Haney, MD  CARTIA XT 300 MG 24 hr capsule TAKE 1 CAPSULE(300 MG) BY MOUTH DAILY 01/10/17   Minus Breeding, MD  cyclobenzaprine (FLEXERIL) 10 MG tablet Take 0.5-1 tablets (5-10 mg total) by mouth 3 (three) times daily as needed for muscle spasms. 12/02/16   Hillary Corinda Gubler, MD  doxycycline (VIBRA-TABS) 100 MG tablet Take 1 tablet (100 mg total) by mouth 2 (two) times daily. 02/02/17   Robyn Haber, MD  fluticasone (FLONASE) 50 MCG/ACT nasal spray Place 2 sprays into both nostrils daily. 11/08/14   Rosemarie Ax, MD  HYDROcodone-acetaminophen (NORCO/VICODIN) 5-325 MG tablet Take 1-2 tablets by mouth every 4 (four) hours as needed for moderate pain. 12/02/16   Hillary Corinda Gubler, MD  HYDROcodone-homatropine (HYDROMET) 5-1.5 MG/5ML syrup Take 5 mLs by mouth every 6 (six) hours as needed for cough. 02/02/17   Robyn Haber, MD  levothyroxine (SYNTHROID, LEVOTHROID) 137 MCG tablet TAKE 1 TABLET BY MOUTH DAILY BEFORE BREAKFAST. 07/14/16   Sela Hilding, MD  levothyroxine (SYNTHROID, LEVOTHROID) 137 MCG tablet TAKE 1 TABLET BY MOUTH DAILY BEFORE BREAKFAST. 09/07/16   Sela Hilding, MD  nitroGLYCERIN (NITROSTAT) 0.4 MG SL tablet PLACE 1 TABLET UNDER TONGUE EVERY 5 MINUTES AS NEEDED FOR CHEST PAIN 09/15/15   Rosemarie Ax, MD  predniSONE (DELTASONE) 20 MG tablet Two daily with food 02/02/17   Robyn Haber, MD  warfarin (COUMADIN) 5 MG tablet TAKE 1 TO 1 AND 1/2 TABLETS BY MOUTH EVERY EVENING AS DIRECTED BY COUMADIN CLINIC 01/11/17   Minus Breeding, MD    Family History Family History  Problem Relation Age of Onset  . Diabetes Mother   . Heart disease Mother   . Colon cancer Mother 95  . Heart disease Father   . Cancer Sister     Lung  . Cancer Brother     intestinal  . Cancer Brother     throat ca  . Cancer Brother     bladder    Social History Social History    Substance Use Topics  . Smoking status: Former Smoker    Packs/day: 1.50    Years: 15.00    Types: Cigarettes    Quit date: 11/09/1981  . Smokeless tobacco: Never Used  . Alcohol use No     Allergies   Penicillins and Percocet [oxycodone-acetaminophen]   Review of Systems Review of Systems  Constitutional: Positive for activity change and fatigue. Negative for diaphoresis and fever.  HENT: Positive for congestion.   Respiratory: Positive for cough.   Gastrointestinal: Negative.   Genitourinary: Negative.   Musculoskeletal: Negative.   Neurological: Negative.      Physical Exam Triage Vital Signs ED Triage Vitals  Enc Vitals Group     BP      Pulse      Resp  Temp      Temp src      SpO2      Weight      Height      Head Circumference      Peak Flow      Pain Score      Pain Loc      Pain Edu?      Excl. in [place]?    No data found.   Updated Vital Signs BP 113/69 (BP Location: Left Arm)   Pulse 84   Temp 98.9 F (37.2 C) (Oral)   Resp 20   SpO2 94%    Physical Exam  Constitutional: She is oriented to person, place, and time. She appears well-developed and well-nourished.  HENT:  Head: Normocephalic.  Mouth/Throat: Oropharynx is clear and moist.  Bilateral cerumen impaction  Eyes: Conjunctivae and EOM are normal.  Neck: Normal range of motion. Neck supple.  Cardiovascular: Normal rate, regular rhythm and normal heart sounds.   Pulmonary/Chest: Effort normal. She has wheezes. She has rales.  Left lower lobe rales, bilateral expiratory wheezes  Musculoskeletal: Normal range of motion.  Lymphadenopathy:    She has no cervical adenopathy.  Neurological: She is alert and oriented to person, place, and time.  Skin: Skin is warm and dry.  Nursing note and vitals reviewed.    UC Treatments / Results  Labs (all labs ordered are listed, but only abnormal results are displayed) Labs Reviewed - No data to display  EKG  EKG  Interpretation None       Radiology Dg Chest 2 View  Result Date: 02/02/2017 CLINICAL DATA:  Per pt: sick for three weeks, coughing, no fever, SOB, slight wheezing, very little sputum that is brown when it does come up. Non-smoker. History of Afib. No history of respiratory disease. No HBP. Patient is not a diabetic EXAM: CHEST  2 VIEW COMPARISON:  09/11/2015 FINDINGS: Lateral view degraded by patient arm position. Midline trachea. Moderate cardiomegaly. Atherosclerosis in the transverse aorta. Mediastinal contours otherwise within normal limits. No pleural effusion or pneumothorax. Lower lobe predominant pulmonary interstitial thickening is similar. No lobar consolidation. IMPRESSION: Cardiomegaly with chronic lower lobe predominant interstitial thickening. Favored to be the sequelae of smoking/chronic bronchitis. Mild pulmonary venous congestion cannot be excluded. No overt congestive failure. Aortic atherosclerosis. Electronically Signed   By: Abigail Miyamoto M.D.   On: 02/02/2017 13:22    Procedures Procedures (including critical care time) We spent 30 minutes lavaging the ear and finally got both ear canals clear of cerumen.  Medications Ordered in UC Medications - No data to display   Initial Impression / Assessment and Plan / UC Course  I have reviewed the triage vital signs and the nursing notes.  Pertinent labs & imaging results that were available during my care of the patient were reviewed by me and considered in my medical decision making (see chart for details).     Final Clinical Impressions(s) / UC Diagnoses   Final diagnoses:  Bronchitis  Acute maxillary sinusitis, recurrence not specified  Bilateral hearing loss due to cerumen impaction    New Prescriptions New Prescriptions   DOXYCYCLINE (VIBRA-TABS) 100 MG TABLET    Take 1 tablet (100 mg total) by mouth 2 (two) times daily.   HYDROCODONE-HOMATROPINE (HYDROMET) 5-1.5 MG/5ML SYRUP    Take 5 mLs by mouth every 6  (six) hours as needed for cough.   PREDNISONE (DELTASONE) 20 MG TABLET    Two daily with food  Robyn Haber, MD 02/02/17 1335    Robyn Haber, MD 02/02/17 1350

## 2017-02-09 DIAGNOSIS — H2511 Age-related nuclear cataract, right eye: Secondary | ICD-10-CM | POA: Diagnosis not present

## 2017-02-09 DIAGNOSIS — H2513 Age-related nuclear cataract, bilateral: Secondary | ICD-10-CM | POA: Diagnosis not present

## 2017-02-18 DIAGNOSIS — Z8551 Personal history of malignant neoplasm of bladder: Secondary | ICD-10-CM | POA: Diagnosis not present

## 2017-02-24 ENCOUNTER — Encounter: Payer: Self-pay | Admitting: Family Medicine

## 2017-02-24 ENCOUNTER — Ambulatory Visit (INDEPENDENT_AMBULATORY_CARE_PROVIDER_SITE_OTHER): Payer: Medicare Other | Admitting: Family Medicine

## 2017-02-24 VITALS — BP 128/88 | HR 79 | Temp 98.4°F | Ht 63.0 in | Wt 191.0 lb

## 2017-02-24 DIAGNOSIS — L989 Disorder of the skin and subcutaneous tissue, unspecified: Secondary | ICD-10-CM

## 2017-02-24 DIAGNOSIS — J209 Acute bronchitis, unspecified: Secondary | ICD-10-CM

## 2017-02-24 NOTE — Patient Instructions (Addendum)
Thank you so much for coming to visit today!  I'm glad your cough is doing better! It should continue to improve. I suspect your shoulder pain is from your cough and it should continue to improve as well. You may take Tylenol or try heat and Salonpas patches. Please follow up with your Cardiologist. Good luck on your cataract surgery. Please follow up if no improvement over the next month.  Dr. Gerlean Ren

## 2017-02-26 NOTE — Progress Notes (Signed)
Subjective:     Patient ID: [name], female   DOB: 1951-08-24, 66 y.o.   MRN: [id number]  HPI [name] is a 66yo female presenting today for Urgent Care follow up. ED visit noted on 02/02/17 with complaints of cough, hearing loss, and fatigue for 3weeks. Diagnosed with Bronchitis and Maxillary Sinusitis and prescriptions for Doxycycline, Prednisone, and Hydromet given. Reports she is doing much better since that visit. Still with mild cough. Did note some left back pain with cough, but this has been improving as well and is now minimal. Completed course of Doxycycline and Prednisone as prescribed. Does not need any refills of medication.  Also of note, reports skin lesion on left cheek that has been present for a while, but is now growing larger. Would like to have this removed. Reports history of actinic keratoses, but this lesion is different.  Former Smoker.  Review of Systems Per HPI    Objective:   Physical Exam  Constitutional: She appears well-developed and well-nourished. No distress.  HENT:  Mouth/Throat: Oropharynx is clear and moist.  Sinuses equal to illumination. No sinus tenderness noted.  Cardiovascular: Normal rate and regular rhythm.   No murmur heard. Pulmonary/Chest: Effort normal. No respiratory distress. She has no wheezes.  Musculoskeletal:  Tenderness over left thoracic paraspinal muscles  Lymphadenopathy:    She has no cervical adenopathy.  Skin:  Pink and Pearly lesion on left cheek with telangiectasias  Psychiatric: She has a normal mood and affect. Her behavior is normal.       Assessment and Plan:     1. Acute bronchitis, unspecified organism Improved. Suspect back pain secondary to cough and this has also been improving. Continue symptomatic treatment. Follow up if no improvement. Consider COPD workup if further episodes occur within the next year.  2. Skin lesion Concerning for Basal Cell Carcinoma. Referral to Dermatology placed for  further evaluation given location.

## 2017-02-27 NOTE — Progress Notes (Signed)
HPI The patient presents for evaluation of atrial fibrillation.   Since I last saw her she she has had bronchitis for 3 weeks. Recovering from this she is a little short of breath and tired. She still takes care of a 68-year-old great grandchild although he will go to school in the fall and his mother did move and it helps with his care as well. She doesn't feel any palpitations and has had no presyncope or syncope. She denies any chest pressure, neck or arm discomfort. She's had no weight gain or edema. She tolerates anticoagulation and would like to switch back to Elavil is now that she is on Medicaid.  Prior to Admission medications   Medication Sig Start Date End Date Taking? Authorizing Provider  aspirin 81 MG chewable tablet Chew 1 tablet (81 mg total) by mouth daily. 09/12/15  Yes Nicolette Bang, DO  atorvastatin (LIPITOR) 80 MG tablet TAKE 1 TABLET(80 MG) BY MOUTH DAILY 02/10/16  Yes Minus Breeding, MD  calcium carbonate (TUMS - DOSED IN MG ELEMENTAL CALCIUM) 500 MG chewable tablet Chew 1 tablet by mouth as needed for indigestion or heartburn.   Yes Historical Provider, MD  carbamide peroxide (DEBROX) 6.5 % otic solution Place 5 drops into both ears 2 (two) times daily. 12/26/15  Yes Alyssa A Lincoln Brigham, MD  cyclobenzaprine (FLEXERIL) 10 MG tablet Take 0.5-1 tablets (5-10 mg total) by mouth 3 (three) times daily as needed for muscle spasms. 10/20/15  Yes Rosemarie Ax, MD  diltiazem (CARTIA XT) 300 MG 24 hr capsule Take 1 capsule (300 mg total) by mouth daily. 12/11/15  Yes Minus Breeding, MD  fluticasone (FLONASE) 50 MCG/ACT nasal spray Place 2 sprays into both nostrils daily. 11/08/14  Yes Rosemarie Ax, MD  HYDROcodone-acetaminophen [facility]) 10-325 MG tablet Take 1-2 tablets by mouth every 4 (four) hours as needed for moderate pain. Maximum dose per 24 hours -8 pills. 12/01/15  Yes Kathie Rhodes, MD  levothyroxine (SYNTHROID, LEVOTHROID) 150 MCG tablet TAKE 1 TABLET BY MOUTH EVERY  DAY 02/16/16  Yes Rosemarie Ax, MD  nitroGLYCERIN (NITROSTAT) 0.4 MG SL tablet PLACE 1 TABLET UNDER TONGUE EVERY 5 MINUTES AS NEEDED FOR CHEST PAIN 09/15/15  Yes Rosemarie Ax, MD  warfarin (COUMADIN) 5 MG tablet Take 1.5 tablets (7.5 mg total) by mouth every evening. Patient taking differently: Take 7.5 mg by mouth every evening. Sunday and thrusday's takes only 5 mg 11/27/15  Yes Rosemarie Ax, MD     Allergies  Allergen Reactions  . Penicillins Itching  . Percocet [Oxycodone-Acetaminophen] Itching                                                                                                   Past Medical History:  Diagnosis Date  . Anticoagulated on Coumadin   . Arthritis    wrist, hands -  . Atrial fibrillation, persistent ([place])   . Bladder tumor   . CAD (coronary artery disease) cardiologist-  dr hochrien   a. PTCA to RCA (02/ 1999);  b.  Myoview 5/10:  EF 71%, no ischemia  .  Full dentures   . GERD (gastroesophageal reflux disease)   . History of colon polyps    2005  hyperplastic  . History of MI (myocardial infarction)    1999  . HLD (hyperlipidemia)   . Hypertension   . Hypothyroidism   . Prediabetes   . SUI (stress urinary incontinence, female)   . Wears glasses   . Wears glasses      Past Surgical History:  Procedure Laterality Date  . CARDIOVASCULAR STRESS TEST  05-01-2009   dr Christohper Dube   normal nuclear study/  no ischemia or scar/  normal LV function and wall motion, ef 71%  . CORONARY ANGIOPLASTY  Feb 1999   PTCA to RCA  . D & C HYSTEROSCOPY/  POLYPECTOMY  05-08-2009  . HYSTEROSCOPY W/D&C  12/07/2012   Procedure: DILATATION AND CURETTAGE /HYSTEROSCOPY;  Surgeon: Elveria Royals, MD;  Location: [facility];  Service: Gynecology;  Laterality: N/A;  Dilitation and curettage /hysteroscopy/biopsey of lower segment of uterus  . TOTAL HIP ARTHROPLASTY Left 12-11-2010  . TRANSTHORACIC ECHOCARDIOGRAM  09-11-2015   mild LVH, ef 55%,  mildly  reduced RVSF,  trivial TR  . TRANSURETHRAL RESECTION OF BLADDER TUMOR N/A 12/01/2015   Procedure: TRANSURETHRAL RESECTION OF BLADDER TUMOR (TURBT);  Surgeon: Kathie Rhodes, MD;  Location: [facility];  Service: Urology;  Laterality: N/A;  . TUBAL LIGATION  1974     ROS:  As stated in the HPI and negative for all other systems.  PHYSICAL EXAM BP 118/76 (BP Location: Right Arm, Patient Position: Sitting, Cuff Size: Normal)   Pulse (!) 107   Ht '5\' 3"'$  (1.6 m)   Wt 188 lb 9.6 oz (85.5 kg)   BMI 33.41 kg/m  GENERAL:  Well appearing HEENT:  Pupils equal round reactive to light, dentures NECK:  No jugular venous distention, waveform within normal limits, carotid upstroke brisk and symmetric, no bruits, no thyromegaly LUNGS:  Clear to auscultation bilaterally CHEST:  Unremarkable HEART:  PMI not displaced or sustained,S1 and S2 within normal limits, no S3,  n, irregularo clicks, no rubs, no murmurs, irregular.  ABD:  Flat, positive bowel sounds normal in frequency in pitch, no bruits, no rebound, no guarding, no midline pulsatile mass, no hepatomegaly, no splenomegaly EXT:  2 plus pulses throughout, no edema, no cyanosis no clubbing  EKG:  Atrial fibrillation, rate 107, axis within normal limits, intervals within normal limits, poor anterior R wave progression, no acute ST-T wave changes. 03/01/2017  ASSESSMENT AND PLAN  ATRIAL FIBRILLATION -  [name] has a CHA2DS2 - VASc score of 3 with a risk of stroke of 3.2%.  The patient  tolerates this rhythm and rate control and anticoagulation.  She had good rate control when she wore a Holter last year. Even though her heart rates little bit elevated I will make any changes she'll let me know watching this at home. We will switch her to Eliquis today.   ESSENTIAL HYPERTENSION, BENIGN -  The blood pressure is at target. No change in medications is indicated. We will continue with therapeutic lifestyle changes (TLC).     CAD  - She has no active ischemia and no symptoms of perfusion study in 2010. Continue with risk reduction.      DYSLIPIDEMIA - She is not at target with her lipids.  I will switch her to Crestor 40 mg and repeat a lipid and liver in 8 weeks.    Lab Results  Component Value Date  CHOL 225 (H) 07/12/2016   TRIG 180 (H) 07/12/2016   HDL 55 07/12/2016   LDLCALC 134 (H) 07/12/2016   LDLDIRECT 125 (H) 06/01/2010

## 2017-02-28 ENCOUNTER — Ambulatory Visit (INDEPENDENT_AMBULATORY_CARE_PROVIDER_SITE_OTHER): Payer: Medicare Other | Admitting: Pharmacist Clinician (PhC)/ Clinical Pharmacy Specialist

## 2017-02-28 ENCOUNTER — Encounter: Payer: Self-pay | Admitting: Cardiology

## 2017-02-28 ENCOUNTER — Ambulatory Visit (INDEPENDENT_AMBULATORY_CARE_PROVIDER_SITE_OTHER): Payer: Medicare Other | Admitting: Cardiology

## 2017-02-28 VITALS — BP 118/76 | HR 107 | Ht 63.0 in | Wt 188.6 lb

## 2017-02-28 DIAGNOSIS — E785 Hyperlipidemia, unspecified: Secondary | ICD-10-CM | POA: Diagnosis not present

## 2017-02-28 DIAGNOSIS — Z79899 Other long term (current) drug therapy: Secondary | ICD-10-CM

## 2017-02-28 DIAGNOSIS — I4821 Permanent atrial fibrillation: Secondary | ICD-10-CM

## 2017-02-28 DIAGNOSIS — I10 Essential (primary) hypertension: Secondary | ICD-10-CM | POA: Diagnosis not present

## 2017-02-28 DIAGNOSIS — I482 Chronic atrial fibrillation: Secondary | ICD-10-CM | POA: Diagnosis not present

## 2017-02-28 DIAGNOSIS — I481 Persistent atrial fibrillation: Secondary | ICD-10-CM

## 2017-02-28 DIAGNOSIS — Z7901 Long term (current) use of anticoagulants: Secondary | ICD-10-CM

## 2017-02-28 DIAGNOSIS — I4819 Other persistent atrial fibrillation: Secondary | ICD-10-CM

## 2017-02-28 LAB — BASIC METABOLIC PANEL
BUN: 15 mg/dL (ref 7–25)
CO2: 26 mmol/L (ref 20–31)
Calcium: 9.4 mg/dL (ref 8.6–10.4)
Chloride: 103 mmol/L (ref 98–110)
Creat: 1 mg/dL — ABNORMAL HIGH (ref 0.50–0.99)
Glucose, Bld: 106 mg/dL — ABNORMAL HIGH (ref 65–99)
Potassium: 4.2 mmol/L (ref 3.5–5.3)
Sodium: 140 mmol/L (ref 135–146)

## 2017-02-28 LAB — CBC
HCT: 40.1 % (ref 35.0–45.0)
Hemoglobin: 13 g/dL (ref 11.7–15.5)
MCH: 28.6 pg (ref 27.0–33.0)
MCHC: 32.4 g/dL (ref 32.0–36.0)
MCV: 88.3 fL (ref 80.0–100.0)
MPV: 10.4 fL (ref 7.5–12.5)
Platelets: 309 10*3/uL (ref 140–400)
RBC: 4.54 MIL/uL (ref 3.80–5.10)
RDW: 14.2 % (ref 11.0–15.0)
WBC: 9.6 10*3/uL (ref 3.8–10.8)

## 2017-02-28 LAB — POCT INR: INR: 2

## 2017-02-28 MED ORDER — ROSUVASTATIN CALCIUM 40 MG PO TABS: 40.0000 mg | ORAL_TABLET | Freq: Every day | ORAL | 11 refills | Status: DC

## 2017-02-28 MED ORDER — APIXABAN 5 MG PO TABS: 5.0000 mg | ORAL_TABLET | Freq: Two times a day (BID) | ORAL | 11 refills | Status: DC

## 2017-02-28 NOTE — Patient Instructions (Addendum)
Medication Instructions:  STOP- Warfarin and Atorvastatin START- Crestor 40 mg daily and Eliquis 5 mg twice a day  Labwork: CBC and BMP today Fasting Lipids Liver in 8 Weeks  Testing/Procedures: None Ordered  Follow-Up: Your physician recommends that you schedule a follow-up appointment in: 6 Months   Any Other Special Instructions Will Be Listed Below (If Applicable).   If you need a refill on your cardiac medications before your next appointment, please call your pharmacy.

## 2017-03-01 ENCOUNTER — Encounter: Payer: Self-pay | Admitting: Cardiology

## 2017-03-02 ENCOUNTER — Telehealth: Payer: Self-pay | Admitting: Family Medicine

## 2017-03-02 NOTE — Telephone Encounter (Signed)
LMOVM requesting patient to return call. Patient has been scheduled to see Dr. Harriett Sine at [facility] on  Wed, 03/23/2017  at  9:50AM, arrival time 9:30AM .  [facility] Address: [address], [place], [place] [address] Phone: [phone]

## 2017-03-02 NOTE — Telephone Encounter (Signed)
Message was given to pt

## 2017-03-14 ENCOUNTER — Ambulatory Visit (INDEPENDENT_AMBULATORY_CARE_PROVIDER_SITE_OTHER): Payer: Medicare Other | Admitting: Family Medicine

## 2017-03-14 ENCOUNTER — Encounter: Payer: Self-pay | Admitting: Family Medicine

## 2017-03-14 VITALS — BP 110/66 | HR 84 | Temp 98.0°F | Wt 191.0 lb

## 2017-03-14 DIAGNOSIS — R7303 Prediabetes: Secondary | ICD-10-CM

## 2017-03-14 DIAGNOSIS — E039 Hypothyroidism, unspecified: Secondary | ICD-10-CM

## 2017-03-14 DIAGNOSIS — E785 Hyperlipidemia, unspecified: Secondary | ICD-10-CM

## 2017-03-14 DIAGNOSIS — E119 Type 2 diabetes mellitus without complications: Secondary | ICD-10-CM | POA: Diagnosis not present

## 2017-03-14 DIAGNOSIS — R7309 Other abnormal glucose: Secondary | ICD-10-CM | POA: Diagnosis not present

## 2017-03-14 DIAGNOSIS — M25551 Pain in right hip: Secondary | ICD-10-CM

## 2017-03-14 DIAGNOSIS — G8929 Other chronic pain: Secondary | ICD-10-CM

## 2017-03-14 LAB — POCT GLYCOSYLATED HEMOGLOBIN (HGB A1C): Hemoglobin A1C: 6.6

## 2017-03-14 MED ORDER — METFORMIN HCL ER 500 MG PO TB24: 500.0000 mg | ORAL_TABLET | Freq: Every day | ORAL | 3 refills | Status: DC

## 2017-03-14 MED ORDER — GLUCOSE BLOOD VI STRP: ORAL_STRIP | 12 refills | Status: DC

## 2017-03-14 MED ORDER — ONETOUCH DELICA LANCETS FINE MISC: 1.0000 [IU] | Freq: Two times a day (BID) | 1 refills | Status: DC

## 2017-03-14 MED ORDER — ONETOUCH VERIO W/DEVICE KIT: 1.0000 | PACK | Freq: Once | 0 refills | Status: AC

## 2017-03-14 MED ORDER — ONETOUCH DELICA LANCING DEV MISC: 1.0000 [IU] | Freq: Two times a day (BID) | 1 refills | Status: DC

## 2017-03-14 NOTE — Patient Instructions (Addendum)
Take the metformin '500mg'$  XR once per day for 1 week. If you feel an stomach upset, stay with this dose for 1 more week (2 weeks total). If you don't have any symptoms,increase to '500mg'$  twice per day. Check your blood sugars once before you eat in the morning and once before bed.   Blood Glucose Monitoring, Adult Monitoring your blood sugar (glucose) helps you manage your diabetes. It also helps you and your health care provider determine how well your diabetes management plan is working. Blood glucose monitoring involves checking your blood glucose as often as directed, and keeping a record (log) of your results over time. Why should I monitor my blood glucose? Checking your blood glucose regularly can:  Help you understand how food, exercise, illnesses, and medicines affect your blood glucose.  Let you know what your blood glucose is at any time. You can quickly tell if you are having low blood glucose (hypoglycemia) or high blood glucose (hyperglycemia).  Help you and your health care provider adjust your medicines as needed. When should I check my blood glucose? Follow instructions from your health care provider about how often to check your blood glucose. This may depend on:  The type of diabetes you have.  How well-controlled your diabetes is.  Medicines you are taking. If you have type 1 diabetes:   Check your blood glucose at least 2 times a day.  Also check your blood glucose:  Before every insulin injection.  Before and after exercise.  Between meals.  2 hours after a meal.  Occasionally between 2:00 a.m. and 3:00 a.m., as directed.  Before potentially dangerous tasks, like driving or using heavy machinery.  At bedtime.  You may need to check your blood glucose more often, up to 6-10 times a day:  If you use an insulin pump.  If you need multiple daily injections (MDI).  If your diabetes is not well-controlled.  If you are ill.  If you have a history of  severe hypoglycemia.  If you have a history of not knowing when your blood glucose is getting low (hypoglycemia unawareness). If you have type 2 diabetes:   If you take insulin or other diabetes medicines, check your blood glucose at least 2 times a day.  If you are on intensive insulin therapy, check your blood glucose at least 4 times a day. Occasionally, you may also need to check between 2:00 a.m. and 3:00 a.m., as directed.  Also check your blood glucose:  Before and after exercise.  Before potentially dangerous tasks, like driving or using heavy machinery.  You may need to check your blood glucose more often if:  Your medicine is being adjusted.  Your diabetes is not well-controlled.  You are ill. What is a blood glucose log?  A blood glucose log is a record of your blood glucose readings. It helps you and your health care provider:  Look for patterns in your blood glucose over time.  Adjust your diabetes management plan as needed.  Every time you check your blood glucose, write down your result and notes about things that may be affecting your blood glucose, such as your diet and exercise for the day.  Most glucose meters store a record of glucose readings in the meter. Some meters allow you to download your records to a computer. How do I check my blood glucose? Follow these steps to get accurate readings of your blood glucose: Supplies needed    Blood glucose meter.  Test  strips for your meter. Each meter has its own strips. You must use the strips that come with your meter.  A needle to prick your finger (lancet). Do not use lancets more than once.  A device that holds the lancet (lancing device).  A journal or log book to write down your results. Procedure   Wash your hands with soap and water.  Prick the side of your finger (not the tip) with the lancet. Use a different finger each time.  Gently rub the finger until a small drop of blood  appears.  Follow instructions that come with your meter for inserting the test strip, applying blood to the strip, and using your blood glucose meter.  Write down your result and any notes. Alternative testing sites   Some meters allow you to use areas of your body other than your finger (alternative sites) to test your blood.  If you think you may have hypoglycemia, or if you have hypoglycemia unawareness, do not use alternative sites. Use your finger instead.  Alternative sites may not be as accurate as the fingers, because blood flow is slower in these areas. This means that the result you get may be delayed, and it may be different from the result that you would get from your finger.  The most common alternative sites are:  Forearm.  Thigh.  Palm of the hand. Additional tips   Always keep your supplies with you.  If you have questions or need help, all blood glucose meters have a 24-hour "hotline" number that you can call. You may also contact your health care provider.  After you use a few boxes of test strips, adjust (calibrate) your blood glucose meter by following instructions that came with your meter. This information is not intended to replace advice given to you by your health care provider. Make sure you discuss any questions you have with your health care provider. Document Released: 12/16/2003 Document Revised: 07/02/2016 Document Reviewed: 05/24/2016 Elsevier Interactive Patient Education  2017 [employer].

## 2017-03-14 NOTE — Assessment & Plan Note (Signed)
Continue 126mg synthroid. Patient tolerating well, denies any of the dizziness that she was experiencing in August around the time she took this medicine. Recheck TSH and free T4 today.

## 2017-03-14 NOTE — Assessment & Plan Note (Addendum)
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 French Ana 8 years ago. Slow onset of dull pain radiating to groin over several months. Limits her exercise, which is not helping with weight loss. Placed referral to ortho, suspect OA, patient would like evaluation for possible R hip replacement.

## 2017-03-14 NOTE — Assessment & Plan Note (Signed)
Dr. Percival Spanish switched her to Crestor, she has been tolerating this well.

## 2017-03-14 NOTE — Progress Notes (Signed)
CC: HLD, prediabetes  HPI  Follow up on prediabetes, weight and HLD. Cardiology recently switched her to Crestor, which she is tolerating well. She was also switched from warfarin to Eliquis, which she is excited about given less monitoring.   Denies any more dizziness from last visit. In terms of prediabetes, she does not check CBGs at home. Tries to walk, but has some hip pain. Tries to limit fried foods, has lost some weight with recent bronchitis.   Hip pain - hx of L hip replacement 8 years ago with Dr. French Ana Raliegh Ip). This hip pain has been going on for >1 year, radiates to groin.   CC, SH/smoking status, and VS noted  Objective: BP 110/66   Pulse 84   Temp 98 F (36.7 C) (Oral)   Wt 191 lb (86.6 kg)   SpO2 97%   BMI 33.83 kg/m  Gen: NAD, alert, cooperative, and pleasant overweight female.  HEENT: NCAT, EOMI, PERRL CV: RRR, no murmur Resp: CTAB, no wheezes, non-labored Abd: SNTND, BS present, no guarding or organomegaly Ext: No edema, warm. R hip flexion limited by pain, TTP over lateral joint and femoral head.  Neuro: Alert and oriented, Speech clear, No gross deficits  Assessment and plan:  Hypothyroidism Continue 129mg synthroid. Patient tolerating well, denies any of the dizziness that she was experiencing in August around the time she took this medicine. Recheck TSH and free T4 today.   HLD (hyperlipidemia) Dr. HPercival Spanishswitched her to Crestor, she has been tolerating this well.   Di[place] Recheck HgbA1C today - 6.6, in diabetes range. Counseling provided on CBGs BID, metformin and up titration after toleration without GI upset. Referral to nutrition education placed. Diet and exercise counseling given. Follow up 3 months.   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 CFrench Ana8 years ago. Slow onset of dull pain radiating to groin over several months. Limits her exercise, which is not helping with weight loss. Placed  referral to ortho, suspect OA, patient would like evaluation for possible R hip replacement.    Orders Placed This Encounter  Procedures  . TSH  . T4, Free  . Ambulatory referral to diabetic education    Referral Priority:   Routine    Referral Type:   Consultation    Referral Reason:   Specialty Services Required    Number of Visits Requested:   1  . Ambulatory referral to Orthopedic Surgery    Referral Priority:   Routine    Referral Type:   Surgical    Referral Reason:   Specialty Services Required    Requested Specialty:   Orthopedic Surgery    Number of Visits Requested:   1  . HgB A1c    Meds ordered this encounter  Medications  . metFORMIN (GLUCOPHAGE-XR) 500 MG 24 hr tablet    Sig: Take 1 tablet (500 mg total) by mouth daily with breakfast. Increase to 5059mtwice per day if not having side effects after 1 week.    Dispense:  60 tablet    Refill:  3  . Blood Glucose Monitoring Suppl (ONETOUCH VERIO) w/Device KIT    Sig: 1 kit by Does not apply route once.    Dispense:  1 kit    Refill:  0  . glucose blood (ONETOUCH VERIO) test strip    Sig: Use as instructed    Dispense:  100 each    Refill:  12  .  Lancet Devices (ONE TOUCH DELICA LANCING DEV) MISC    Sig: 1 Units by Does not apply route 2 (two) times daily at 8 am and 10 pm.    Dispense:  1 each    Refill:  1  . ONETOUCH DELICA LANCETS FINE MISC    Sig: 1 Units by Does not apply route 2 (two) times daily at 8 am and 10 pm.    Dispense:  100 each    Refill:  1     Ralene Ok, MD, PGY1 03/14/2017 4:16 PM

## 2017-03-14 NOTE — Assessment & Plan Note (Addendum)
Recheck HgbA1C today - 6.6, in diabetes range. Counseling provided on CBGs BID, metformin and up titration after toleration without GI upset. Referral to nutrition education placed. Diet and exercise counseling given. Follow up 3 months.

## 2017-03-15 ENCOUNTER — Other Ambulatory Visit: Payer: Self-pay | Admitting: Cardiology

## 2017-03-15 DIAGNOSIS — H2511 Age-related nuclear cataract, right eye: Secondary | ICD-10-CM | POA: Diagnosis not present

## 2017-03-15 LAB — T4, FREE: Free T4: 1.48 ng/dL (ref 0.82–1.77)

## 2017-03-15 LAB — TSH: TSH: 0.441 u[IU]/mL — ABNORMAL LOW (ref 0.450–4.500)

## 2017-03-15 NOTE — Telephone Encounter (Signed)
Rx(s) sent to pharmacy electronically.  

## 2017-03-17 ENCOUNTER — Telehealth: Payer: Self-pay | Admitting: Family Medicine

## 2017-03-17 MED ORDER — LEVOTHYROXINE SODIUM 112 MCG PO TABS: 112.0000 ug | ORAL_TABLET | Freq: Every day | ORAL | 3 refills | Status: DC

## 2017-03-17 NOTE — Telephone Encounter (Signed)
Please call patient and tell her that we need to go down on her synthroid. I am sending her a new prescription now for 127mg. Thanks.

## 2017-03-17 NOTE — Telephone Encounter (Signed)
Pt informed of below. Zimmerman Rumple, Kinzy Weyers D, CMA  

## 2017-03-22 DIAGNOSIS — H2512 Age-related nuclear cataract, left eye: Secondary | ICD-10-CM | POA: Diagnosis not present

## 2017-03-23 DIAGNOSIS — C44319 Basal cell carcinoma of skin of other parts of face: Secondary | ICD-10-CM | POA: Diagnosis not present

## 2017-04-05 DIAGNOSIS — C44319 Basal cell carcinoma of skin of other parts of face: Secondary | ICD-10-CM | POA: Diagnosis not present

## 2017-04-07 ENCOUNTER — Ambulatory Visit: Payer: Medicare Other

## 2017-04-14 ENCOUNTER — Ambulatory Visit: Payer: Medicare Other

## 2017-04-25 DIAGNOSIS — E785 Hyperlipidemia, unspecified: Secondary | ICD-10-CM | POA: Diagnosis not present

## 2017-04-25 DIAGNOSIS — I10 Essential (primary) hypertension: Secondary | ICD-10-CM | POA: Diagnosis not present

## 2017-04-25 LAB — LIPID PANEL
Cholesterol: 150 mg/dL (ref <200)
HDL: 58 mg/dL (ref >50)
LDL Cholesterol: 64 mg/dL (ref <100)
Total CHOL/HDL Ratio: 2.6 Ratio (ref <5.0)
Triglycerides: 139 mg/dL (ref <150)
VLDL: 28 mg/dL (ref <30)

## 2017-04-25 LAB — HEPATIC FUNCTION PANEL
ALT: 26 U/L (ref 6–29)
AST: 25 U/L (ref 10–35)
Albumin: 3.9 g/dL (ref 3.6–5.1)
Alkaline Phosphatase: 72 U/L (ref 33–130)
Bilirubin, Direct: 0.1 mg/dL (ref <=0.2)
Indirect Bilirubin: 0.3 mg/dL (ref 0.2–1.2)
Total Bilirubin: 0.4 mg/dL (ref 0.2–1.2)
Total Protein: 7.6 g/dL (ref 6.1–8.1)

## 2017-05-10 ENCOUNTER — Telehealth: Payer: Self-pay | Admitting: Cardiology

## 2017-05-10 NOTE — Telephone Encounter (Signed)
Returning your call. °

## 2017-05-10 NOTE — Telephone Encounter (Signed)
Leave message for pt to call back 

## 2017-05-17 NOTE — Telephone Encounter (Signed)
Pt aware of her test result

## 2017-05-18 DIAGNOSIS — Z8551 Personal history of malignant neoplasm of bladder: Secondary | ICD-10-CM | POA: Diagnosis not present

## 2017-06-09 ENCOUNTER — Ambulatory Visit (INDEPENDENT_AMBULATORY_CARE_PROVIDER_SITE_OTHER): Payer: Medicare Other | Admitting: Family Medicine

## 2017-06-09 VITALS — BP 104/62 | HR 96 | Temp 98.3°F | Ht 63.0 in | Wt 184.8 lb

## 2017-06-09 DIAGNOSIS — E118 Type 2 diabetes mellitus with unspecified complications: Secondary | ICD-10-CM

## 2017-06-09 DIAGNOSIS — B351 Tinea unguium: Secondary | ICD-10-CM | POA: Diagnosis not present

## 2017-06-09 LAB — POCT GLYCOSYLATED HEMOGLOBIN (HGB A1C): Hemoglobin A1C: 6.2

## 2017-06-09 LAB — POCT UA - MICROALBUMIN
Albumin/Creatinine Ratio, Urine, POC: >300
Creatinine, POC: 200 mg/dL
Microalbumin Ur, POC: 150 mg/L

## 2017-06-09 MED ORDER — TERBINAFINE HCL 250 MG PO TABS: 250.0000 mg | ORAL_TABLET | Freq: Every day | ORAL | 0 refills | Status: DC

## 2017-06-09 NOTE — Progress Notes (Signed)
URINE   

## 2017-06-09 NOTE — Assessment & Plan Note (Addendum)
Doing very well on metformin, will continue current plan. Obtained screening microalbumin today. Hgb A1c decreased from 6.6 to 6.2, which the patient is excited about. She has an eye appointment coming up. Also already on ASA and statin.

## 2017-06-09 NOTE — Patient Instructions (Signed)
It was a pleasure to see you today! Thank you for choosing [facility] for your primary care. [name] was seen for diabetes and toe nail fungus.   Our plans for today were:  Keep up the great work with the diabetes! Congratulations on losing weight!  Please have your eye doctor send over your records after your visit.   For the toenails, I have sent over a prescription. You need to take it every day for 12 weeks. You also need to see me in 6 weeks to have your liver enzymes checked.   To keep you healthy, we need to monitor some screening tests. You are due for a mammogram. Please schedule that when you can.   You should return to our clinic to see Dr. Lindell Noe in 6 weeks for liver enzymes.   Best,  Dr. Lindell Noe

## 2017-06-09 NOTE — Assessment & Plan Note (Signed)
Present in 9 out of 10 toenails today. Patient has tried over-the-counter Lamisil topically, this did not help. She is very frustrated by the appearance of her toenails. She is willing to come and have her LFTs checked in 6 weeks. Discussed with Dr. Valentina Lucks, pharmacist, no noted interactions between Eliquis and terbinafine. Prescribed terbinafine 250 mg daily 12 weeks.

## 2017-06-09 NOTE — Progress Notes (Signed)
   CC: DM  HPI  DM - on 500 BID metformin. She is tolerating this well, not experiencing any GI side effects. Brings her glucometer with her, fasting sugars are between 80 and 120. Postprandial sugars between 1:30 and 200. 200s were rare, she reports that this was a special event. Diet changes including trying to eat more vegetables. She has also lost 7 pounds and she is excited about this. She would like to continue to lose weight. She drinks water and unsweet tea, with the occasional diet soda. Already has and appt for optho first week of July, she will asked them to fax the records here. She knows she is due for her mammogram, she will schedule this.  CC, SH/smoking status, and VS noted  Objective: BP 104/62   Pulse 96   Temp 98.3 F (36.8 C) (Oral)   Ht 5\' 3"  (1.6 m)   Wt 83.8 kg (184 lb 12.8 oz)   SpO2 95%   BMI 32.74 kg/m  Gen: NAD, alert, cooperative, and pleasant female.  HEENT: NCAT, EOMI, PERRL CV: RRR, no murmur Resp: CTAB, no wheezes, non-labored Abd: SNTND, BS present, no guarding or organomegaly Ext: No edema, warm Neuro: Alert and oriented, Speech clear, No gross deficits Diabetic Foot Exam - Simple   Simple Foot Form Diabetic Foot exam was performed with the following findings:  Yes 06/09/2017  3:45 PM  Visual Inspection No deformities, no ulcerations, no other skin breakdown bilaterally:  Yes Sensation Testing Intact to touch and monofilament testing bilaterally:  Yes Pulse Check Posterior Tibialis and Dorsalis pulse intact bilaterally:  Yes Comments Significant onychomycosis and 9 out of 10 toenails.    Assessment and plan:  Diabetes [facility]) Doing very well on metformin, will continue current plan. Obtained screening microalbumin today. Hgb A1c decreased from 6.6 to 6.2, which the patient is excited about. She has an eye appointment coming up. Also already on ASA and statin.  Onychomycosis of toenail Present in 9 out of 10 toenails today. Patient has tried  over-the-counter Lamisil topically, this did not help. She is very frustrated by the appearance of her toenails. She is willing to come and have her LFTs checked in 6 weeks. Discussed with Dr. Valentina Lucks, pharmacist, no noted interactions between Eliquis and terbinafine. Prescribed terbinafine 250 mg daily 12 weeks.   Orders Placed This Encounter  Procedures  . POCT UA - Microalbumin  . HgB A1c    Meds ordered this encounter  Medications  . terbinafine (LAMISIL) 250 MG tablet    Sig: Take 1 tablet (250 mg total) by mouth daily.    Dispense:  84 tablet    Refill:  0    Health Maintenance: due for mammo, she will schedule  Ralene Ok, MD, PGY1 06/10/2017 3:02 PM

## 2017-06-15 ENCOUNTER — Other Ambulatory Visit: Payer: Self-pay | Admitting: Family Medicine

## 2017-06-16 ENCOUNTER — Other Ambulatory Visit: Payer: Self-pay | Admitting: Family Medicine

## 2017-07-05 DIAGNOSIS — H40013 Open angle with borderline findings, low risk, bilateral: Secondary | ICD-10-CM | POA: Diagnosis not present

## 2017-07-18 ENCOUNTER — Other Ambulatory Visit: Payer: Self-pay | Admitting: Family Medicine

## 2017-07-18 DIAGNOSIS — Z1231 Encounter for screening mammogram for malignant neoplasm of breast: Secondary | ICD-10-CM

## 2017-07-21 ENCOUNTER — Ambulatory Visit (INDEPENDENT_AMBULATORY_CARE_PROVIDER_SITE_OTHER): Payer: Medicare Other | Admitting: Family Medicine

## 2017-07-21 ENCOUNTER — Encounter: Payer: Self-pay | Admitting: Family Medicine

## 2017-07-21 VITALS — BP 110/68 | HR 75 | Temp 98.3°F | Ht 63.0 in | Wt 181.2 lb

## 2017-07-21 DIAGNOSIS — B351 Tinea unguium: Secondary | ICD-10-CM | POA: Diagnosis not present

## 2017-07-21 DIAGNOSIS — I739 Peripheral vascular disease, unspecified: Secondary | ICD-10-CM | POA: Diagnosis not present

## 2017-07-21 DIAGNOSIS — E118 Type 2 diabetes mellitus with unspecified complications: Secondary | ICD-10-CM

## 2017-07-21 LAB — POCT UA - MICROALBUMIN
Creatinine, POC: 200 mg/dL
Microalbumin Ur, POC: 150 mg/L

## 2017-07-21 NOTE — Assessment & Plan Note (Addendum)
Continue metformin, CBGs at home appropriate. Congratulated patient on continued weight loss of 3 pounds. Repeat microalbumin elevated, will start low-dose ace after ABI evaluation for leg claudication as below.

## 2017-07-21 NOTE — Patient Instructions (Signed)
It was a pleasure to see you today! Thank you for choosing [facility] for your primary care. [name] was seen for DM, toenails.   Our plans for today were:  I will call you if anything is abnormal with your labs.   See Dr. Valentina Lucks next week for your foot testing.   To keep you healthy, we need to monitor some screening tests. It looks like your mammogram is scheduled.   You should return to our clinic to see Dr. Lindell Noe in 6 weeks for DM followup.   Best,  Dr. Lindell Noe

## 2017-07-21 NOTE — Assessment & Plan Note (Addendum)
Recheck LFTs today, continue terbinafine for 6 more weeks. Counseled the patient that clinical improvement in her toenails will be very gradual.

## 2017-07-21 NOTE — Progress Notes (Signed)
   CC: follow up DM and LFTs  HPI Diabetes Mellitus: Patient presents for follow up of diabetes. Symptoms: none. Symptoms have been well-controlled. Patient denies foot ulcerations, hyperglycemia and hypoglycemia .  Home sugars: BGs consistently in an acceptable range (120-140 fasting). Treatment to date: weight loss of 10 lbs which has been effective, metformin. Patient notes that she has continued to limit fried foods and sugary beverages. She doesn't for exercise, although she notes some pain in her bilateral lower extremities with walking.  Leg pain: Exercise limited due to pain in bilateral calves that comes on after walking about a half a mile. This has been going on for several years. Patient has not tried anything for this. Resolves with rest.  Toenails: has been taking the terbinafine without any side effects or issues. She feels like her nails may look slightly better.   CC, SH/smoking status, and VS noted  Objective: BP 110/68   Pulse 75   Temp 98.3 F (36.8 C) (Oral)   Ht '5\' 3"'$  (1.6 m)   Wt 181 lb 3.2 oz (82.2 kg)   SpO2 97%   BMI 32.10 kg/m  Gen: NAD, alert, cooperative, and pleasant. HEENT: NCAT, EOMI, PERRL CV: RRR, no murmur Resp: CTAB, no wheezes, non-labored Abd: SNTND, BS present, no guarding or organomegaly Ext: No edema, warm. PT pulses intact bilaterally.  Neuro: Alert and oriented, Speech clear, No gross deficits  Assessment and plan:  Onychomycosis of toenail Recheck LFTs today, continue terbinafine for 6 more weeks. Counseled the patient that clinical improvement in her toenails will be very gradual.  Diabetes ([place]) Continue metformin, CBGs at home appropriate. Congratulated patient on continued weight loss of 3 pounds. Repeat microalbumin elevated, will start low-dose ace after ABI evaluation for leg claudication as below.   Claudication [facility]) Patient describes bilateral calf pain that worsens with prolonged walking, relieved by rest. This is been  going on for several years. Posterior tibial pulses intact bilaterally, referred for ABIs.   Orders Placed This Encounter  Procedures  . CMP14+EGFR  . POCT UA - Microalbumin    No orders of the defined types were placed in this encounter.  Health Maintenance reviewed - mammogram scheduled, patient has already asked optho to send results of recent exam - normal per her report.  Ralene Ok, MD, PGY2 07/21/2017 4:14 PM

## 2017-07-21 NOTE — Assessment & Plan Note (Signed)
Patient describes bilateral calf pain that worsens with prolonged walking, relieved by rest. This is been going on for several years. Posterior tibial pulses intact bilaterally, referred for ABIs.

## 2017-07-22 ENCOUNTER — Telehealth: Payer: Self-pay | Admitting: Family Medicine

## 2017-07-22 DIAGNOSIS — R7989 Other specified abnormal findings of blood chemistry: Secondary | ICD-10-CM

## 2017-07-22 LAB — CMP14+EGFR
ALT: 23 IU/L (ref 0–32)
AST: 22 IU/L (ref 0–40)
Albumin/Globulin Ratio: 1.3 (ref 1.2–2.2)
Albumin: 4.3 g/dL (ref 3.6–4.8)
Alkaline Phosphatase: 75 IU/L (ref 39–117)
BUN/Creatinine Ratio: 16 (ref 12–28)
BUN: 20 mg/dL (ref 8–27)
Bilirubin Total: 0.3 mg/dL (ref 0.0–1.2)
CO2: 24 mmol/L (ref 20–29)
Calcium: 9.7 mg/dL (ref 8.7–10.3)
Chloride: 101 mmol/L (ref 96–106)
Creatinine, Ser: 1.27 mg/dL — ABNORMAL HIGH (ref 0.57–1.00)
GFR calc Af Amer: 50 mL/min/{1.73_m2} — ABNORMAL LOW (ref >59)
GFR calc non Af Amer: 43 mL/min/{1.73_m2} — ABNORMAL LOW (ref >59)
Globulin, Total: 3.3 g/dL (ref 1.5–4.5)
Glucose: 99 mg/dL (ref 65–99)
Potassium: 4.7 mmol/L (ref 3.5–5.2)
Sodium: 142 mmol/L (ref 134–144)
Total Protein: 7.6 g/dL (ref 6.0–8.5)

## 2017-07-22 NOTE — Telephone Encounter (Signed)
Called patient to discuss elevated creatinine. Recommended increased water intake and recheck Cr when she comes to see Dr. Valentina Lucks next week. Placed future lab order.

## 2017-07-28 ENCOUNTER — Ambulatory Visit (INDEPENDENT_AMBULATORY_CARE_PROVIDER_SITE_OTHER): Payer: Medicare Other | Admitting: Pharmacist

## 2017-07-28 ENCOUNTER — Encounter: Payer: Self-pay | Admitting: Pharmacist

## 2017-07-28 DIAGNOSIS — I739 Peripheral vascular disease, unspecified: Secondary | ICD-10-CM

## 2017-07-28 DIAGNOSIS — E118 Type 2 diabetes mellitus with unspecified complications: Secondary | ICD-10-CM | POA: Diagnosis not present

## 2017-07-28 NOTE — Patient Instructions (Signed)
Thank you for coming in today. Blood flow test of legs showed normal blood flow. Keep follow up visit with Dr. Lindell Noe in about a month.

## 2017-07-28 NOTE — Assessment & Plan Note (Signed)
Normal ABI and low likelihood of PAD based on ABI of >0.9 in a patient with symptoms of aching calves, restless legs bilaterally, brought on at rest and with exercise, improves with rest.

## 2017-07-28 NOTE — Progress Notes (Signed)
    S:    Patient arrives ambulating without assistance, in good spirits.    She presents to the clinic for PADABI evaluation.  Patient was referred on 07/21/2017.  Patient was last seen by Primary Care Provider on 07/22/2017.   Denies bleeding on eliquis, reports adherence to all meds. Still taking terbinafine for onychomycosis. Reports BP is usually "good". Has 20 PY smoking hx, has maintained smoking cessation x 25 years.   Patient reports pain with walking and standing.  Pain is described as aching in calves (bilaterally, equal) which occurs after 0 minutes of exercise.  Reports pain starting while at rest or standing still, does develop at nighttime occasionally and reports this as "restlessness, feels like I have to keep moving my legs, can't get comfortable." Reports pain worsens when walking up hill or in a hurry. Reports pain when walking at an ordinary pace on a level surface. Reports pain resolves on sitting after 10-15 minutes.  Pain is localized to calves.  O:  Lower extremity Physical Exam includes warm extremities bilaterally, pedal pulses absent bilaterally,  toe nail thickening on all nails (on treatment for onychomycosis), absence of limb hair bilaterally, reports decreased limb hair growth bilaterally  ABI overall = 0.98. Right Arm 130 mmHg    Left Arm 132 mmHg Right ankle posterior tibial 130 mmHg     dorsalis pedis 110 mmHg Left ankle posterior tibial 150 mmHg    dorsalis pedis 80 mmHg   A/P: Normal ABI and low likelihood of PAD based on ABI of >0.9 in a patient with symptoms of aching calves, restless legs bilaterally, brought on at rest and with exercise, improves with rest.  continued medications at present BMET obtained at the request of PCP to follow up elevated SCr.   Results reviewed and written information provided.  Total time in face-to-face counseling 30 minutes.  Patient seen with Shelle Iron, PharmD Candidate, Cleotis Lema,  PharmD Candidate, Vickki Muff PharmD PGY-1 Resident. and Deirdre Pippins, PharmD, PGY2 Resident.

## 2017-07-29 LAB — BASIC METABOLIC PANEL
BUN/Creatinine Ratio: 13 (ref 12–28)
BUN: 13 mg/dL (ref 8–27)
CO2: 22 mmol/L (ref 20–29)
Calcium: 9.2 mg/dL (ref 8.7–10.3)
Chloride: 102 mmol/L (ref 96–106)
Creatinine, Ser: 0.98 mg/dL (ref 0.57–1.00)
GFR calc Af Amer: 69 mL/min/{1.73_m2} (ref >59)
GFR calc non Af Amer: 59 mL/min/{1.73_m2} — ABNORMAL LOW (ref >59)
Glucose: 120 mg/dL — ABNORMAL HIGH (ref 65–99)
Potassium: 4.6 mmol/L (ref 3.5–5.2)
Sodium: 140 mmol/L (ref 134–144)

## 2017-08-01 NOTE — Progress Notes (Signed)
Patient ID: [name], female   DOB: [birth year], 66 y.o.   MRN: [id number] Reviewed: Agree with Dr. Graylin Shiver documentation and management.

## 2017-08-09 ENCOUNTER — Ambulatory Visit: Payer: Medicare Other

## 2017-08-11 ENCOUNTER — Other Ambulatory Visit: Payer: Self-pay | Admitting: Family Medicine

## 2017-08-17 DIAGNOSIS — Z85828 Personal history of other malignant neoplasm of skin: Secondary | ICD-10-CM | POA: Diagnosis not present

## 2017-08-17 DIAGNOSIS — L821 Other seborrheic keratosis: Secondary | ICD-10-CM | POA: Diagnosis not present

## 2017-08-18 ENCOUNTER — Ambulatory Visit
Admission: RE | Admit: 2017-08-18 | Discharge: 2017-08-18 | Disposition: A | Discharge status: Home / Self Care | Payer: Medicare Other | Source: Ambulatory Visit | Attending: Family Medicine | Admitting: Family Medicine

## 2017-08-18 ENCOUNTER — Ambulatory Visit: Payer: Medicare Other

## 2017-08-18 DIAGNOSIS — Z1231 Encounter for screening mammogram for malignant neoplasm of breast: Secondary | ICD-10-CM | POA: Diagnosis not present

## 2017-08-18 DIAGNOSIS — Z8551 Personal history of malignant neoplasm of bladder: Secondary | ICD-10-CM | POA: Diagnosis not present

## 2017-08-22 ENCOUNTER — Ambulatory Visit (INDEPENDENT_AMBULATORY_CARE_PROVIDER_SITE_OTHER): Payer: Medicare Other | Admitting: Family Medicine

## 2017-08-22 ENCOUNTER — Encounter: Payer: Self-pay | Admitting: Family Medicine

## 2017-08-22 VITALS — BP 102/64 | HR 65 | Temp 98.3°F | Wt 182.0 lb

## 2017-08-22 DIAGNOSIS — R590 Localized enlarged lymph nodes: Secondary | ICD-10-CM

## 2017-08-22 NOTE — Progress Notes (Signed)
    Subjective:  [name] is a 66 y.o. female who presents to the [facility] today with a chief complaint of swollen lymph nodes.   HPI:  Swollen lymph nodes - Woke up Friday morning, and lymph nodes hurt and R ear hurt and noticed swelling of her b/l neck lymph nodes in mirror - having some congestion over the last 3 days as well with rhinorrhea and some sore throat - minimal cough nonproductive - no fever/chills, n/v/d - no sick contacts   ROS: Per HPI  Objective:  Physical Exam: BP 102/64   Pulse 65   Temp 98.3 F (36.8 C) (Oral)   Wt 182 lb (82.6 kg)   SpO2 97%   BMI 31.24 kg/m   Gen: NAD, resting comfortably HEENT: [place]. AT. B/l ears filled with cerumen. R ear s/p irrigation and cerumen removal shows TM with good light reflex and without erythema. Neck: supple, good ROM. Mildly TTP with cervical lymphadenopathy CV: RRR with no murmurs appreciated  Pulm: NWOB, CTAB with no crackles, wheezes, or rhonchi GI: Normal bowel sounds present. Soft, Nontender, Nondistended. MSK: no edema, cyanosis, or clubbing noted Skin: warm, dry Neuro: grossly normal, moves all extremities Psych: Normal affect and thought content   Assessment/Plan:  Lymphadenopathy, cervical Likely d/t viral illness given patient's congestion and rhinorrhea over the last few days. Vital stable and patient is well appearing. No signs of AOM or GAS on exam. Discussed supportive care at home with return to clinic if no improvement.   Bufford Lope, DO PGY-2, [facility] 08/22/2017 11:46 AM

## 2017-08-22 NOTE — Patient Instructions (Addendum)
It was good to see you today!  For your sore throat - Please take over counter nasal decongestant and stay well hydrated   Sign up for My Chart to have easy access to your labs results, and communication with your primary care physician.  Feel free to call with any questions or concerns at any time, at [phone].   Take care,  Dr. Bufford Lope, [place]

## 2017-08-23 DIAGNOSIS — R590 Localized enlarged lymph nodes: Secondary | ICD-10-CM | POA: Insufficient documentation

## 2017-08-23 NOTE — Assessment & Plan Note (Signed)
Likely d/t viral illness given patient's congestion and rhinorrhea over the last few days. Vital stable and patient is well appearing. No signs of AOM or GAS on exam. Discussed supportive care at home with return to clinic if no improvement.

## 2017-08-24 ENCOUNTER — Encounter: Payer: Self-pay | Admitting: Family Medicine

## 2017-08-24 DIAGNOSIS — Z8551 Personal history of malignant neoplasm of bladder: Secondary | ICD-10-CM | POA: Insufficient documentation

## 2017-09-05 ENCOUNTER — Ambulatory Visit: Payer: Medicare Other | Admitting: Family Medicine

## 2017-09-07 ENCOUNTER — Other Ambulatory Visit: Payer: Self-pay | Admitting: Family Medicine

## 2017-09-23 ENCOUNTER — Encounter: Payer: Self-pay | Admitting: Cardiology

## 2017-09-30 ENCOUNTER — Other Ambulatory Visit: Payer: Self-pay | Admitting: Family Medicine

## 2017-10-04 ENCOUNTER — Ambulatory Visit: Payer: Medicaid Other | Admitting: Cardiology

## 2017-10-04 ENCOUNTER — Ambulatory Visit (INDEPENDENT_AMBULATORY_CARE_PROVIDER_SITE_OTHER): Payer: Medicare Other | Admitting: Family Medicine

## 2017-10-04 ENCOUNTER — Encounter: Payer: Self-pay | Admitting: Family Medicine

## 2017-10-04 VITALS — BP 128/68 | HR 76 | Temp 98.1°F | Ht 63.0 in | Wt 183.4 lb

## 2017-10-04 DIAGNOSIS — E118 Type 2 diabetes mellitus with unspecified complications: Secondary | ICD-10-CM

## 2017-10-04 LAB — POCT GLYCOSYLATED HEMOGLOBIN (HGB A1C): Hemoglobin A1C: 6.1

## 2017-10-04 MED ORDER — METFORMIN HCL ER 500 MG PO TB24: 1000.0000 mg | ORAL_TABLET | Freq: Two times a day (BID) | ORAL | 3 refills | Status: DC

## 2017-10-04 NOTE — Progress Notes (Signed)
   CC: DM follow up   HPI DM - last Hgba1c 6.2. On metformin 1000mg  BID at home. Slight weight gain today from last visit. CBGs averaging 130-145 at home. Has noticed a little bit of weight gain on home scale as well, was eating some more ice cream. Able to walk for exercise. No concerns with hypoglycemia. Got her flu shot at [employer].   Skin lesion - been present for several months, itched at one point. Feels it is smaller now than previous. No bleeding.   ROS: denies CP, SOB, abd pain, dysuria, changes in BMs.    CC, SH/smoking status, and VS noted  Objective: BP 128/68   Pulse 76   Temp 98.1 F (36.7 C) (Oral)   Ht 5\' 3"  (1.6 m)   Wt 183 lb 6.4 oz (83.2 kg)   SpO2 96%   BMI 32.49 kg/m  Gen: NAD, alert, cooperative, and pleasant. HEENT: NCAT, EOMI, PERRL CV: RRR, no murmur Resp: CTAB, no wheezes, non-labored Ext: No edema, warm Neuro: Alert and oriented, Speech clear, No gross deficits Skin: lesion over left lumbar region, raised and scaly.     Assessment and plan:  Diabetes ([place]) Continue current management. Getting release of information for eye exam today, [facility] the [facility]. RTC in 6 months.   Seborrheic keratosis: left lumbar area. Offered patient to return and have removed via either cryotherapy or curette. She will schedule an appt if it bothers her more.   Orders Placed This Encounter  Procedures  . HgB A1c    Meds ordered this encounter  Medications  . metFORMIN (GLUCOPHAGE-XR) 500 MG 24 hr tablet    Sig: Take 2 tablets (1,000 mg total) by mouth 2 (two) times daily.    Dispense:  120 tablet    Refill:  3    Health Maintenance reviewed - ROI for eyes.   Ralene Ok, MD, PGY2 10/04/2017 2:34 PM

## 2017-10-04 NOTE — Assessment & Plan Note (Signed)
Continue current management. Getting release of information for eye exam today, [facility] the name of [facility]. RTC in 6 months.

## 2017-10-04 NOTE — Patient Instructions (Signed)
It was a pleasure to see you today! Thank you for choosing [facility] for your primary care. [name] was seen for diabetes.   Our plans for today were:  Keep up the great work with your sugars!   Continue exercising.   Come back to see me for your skin lesion.    Best,  Dr. Lindell Noe

## 2017-10-08 NOTE — Progress Notes (Signed)
HPI The patient presents for evaluation of atrial fibrillation.   Since I last saw her she has done well.  She does not notice her atrial fib.  The patient denies any new symptoms such as chest discomfort, neck or arm discomfort. There has been no new shortness of breath, PND or orthopnea. There have been no reported palpitations, presyncope or syncope.  Her great grandson is off to kindergarten.  She has Adkins little time to walk but is not doing much of this.  The patient denies any new symptoms such as chest discomfort, neck or arm discomfort. There has been no new shortness of breath, PND or orthopnea. There have been no reported palpitations, presyncope or syncope.  She has still some decreased exercise tolerance but this has been baseline.  MEDS Prior to Admission medications   Medication Sig Start Date End Date Taking? Authorizing Provider  apixaban (ELIQUIS) 5 MG TABS tablet Take 1 tablet (5 mg total) by mouth 2 (two) times daily. 02/28/17  Yes Sara Breeding, MD  calcium carbonate (TUMS - DOSED IN MG ELEMENTAL CALCIUM) 500 MG chewable tablet Chew 1 tablet by mouth as needed for indigestion or heartburn.   Yes [provider]  carbamide peroxide (DEBROX) 6.5 % otic solution Place 5 drops into both ears 2 (two) times daily. Patient taking differently: Place 5 drops into both ears once Adkins week.  12/26/15  Yes Haney, Alyssa A, MD  CARTIA XT 300 MG 24 hr capsule TAKE 1 CAPSULE(300 MG) BY MOUTH DAILY 03/15/17  Yes Sara Breeding, MD  fluticasone (FLONASE) 50 MCG/ACT nasal spray Place 2 sprays into both nostrils daily. Patient taking differently: Place 2 sprays into both nostrils daily as needed.  11/08/14  Yes Sara Ax, MD  glucose blood [facility] VERIO) test strip Use as instructed 03/14/17  Yes Sara Hilding, MD  Lancet Devices (ONE TOUCH DELICA LANCING DEV) MISC 1 Units by Does not apply route 2 (two) times daily at 8 am and 10 pm. 03/14/17  Yes Sara Hilding, MD    levothyroxine (SYNTHROID, LEVOTHROID) 112 MCG tablet Take 1 tablet (112 mcg total) by mouth daily. 03/17/17  Yes Sara Hilding, MD  metFORMIN (GLUCOPHAGE-XR) 500 MG 24 hr tablet Take 2 tablets (1,000 mg total) by mouth 2 (two) times daily. 10/04/17  Yes Sara Hilding, MD  nitroGLYCERIN (NITROSTAT) 0.4 MG SL tablet PLACE 1 TABLET UNDER TONGUE EVERY 5 MINUTES AS NEEDED FOR CHEST PAIN 09/15/15  Yes Sara Ax, MD  [facility] DELICA LANCETS FINE MISC TEST TWICE DAILY( 8AM AND 10PM) 10/03/17  Yes Sara Hilding, MD  rosuvastatin (CRESTOR) 40 MG tablet Take 1 tablet (40 mg total) by mouth daily. 02/28/17  Yes Sara Breeding, MD      Past Medical History:  Diagnosis Date  . Anticoagulated on Coumadin   . Arthritis    wrist, hands -  . Atrial fibrillation, persistent ([place])   . Bladder tumor   . CAD (coronary artery disease) cardiologist-  [name]   Adkins. PTCA to RCA (02/ 1999);  b.  Myoview 5/10:  EF 71%, no ischemia  . Diabetes ([place])   . Full dentures   . GERD (gastroesophageal reflux disease)   . History of colon polyps    2005  hyperplastic  . History of MI (myocardial infarction)    1999  . HLD (hyperlipidemia)   . Hypertension   . Hypothyroidism   . SUI (stress urinary incontinence, female)   . Wears glasses  Past Surgical History:  Procedure Laterality Date  . CARDIOVASCULAR STRESS TEST  05-01-2009   [name]   normal nuclear study/  no ischemia or scar/  normal LV function and wall motion, ef 71%  . CORONARY ANGIOPLASTY  Feb 1999   PTCA to RCA  . D & C HYSTEROSCOPY/  POLYPECTOMY  05-08-2009  . HYSTEROSCOPY W/D&C  12/07/2012   Procedure: DILATATION AND CURETTAGE /HYSTEROSCOPY;  Surgeon: Sara Royals, MD;  Location: [facility];  Service: Gynecology;  Laterality: N/Adkins;  Dilitation and curettage /hysteroscopy/biopsey of lower segment of uterus  . TOTAL HIP ARTHROPLASTY Left 12-11-2010  . TRANSTHORACIC ECHOCARDIOGRAM  09-11-2015   mild LVH, ef 55%,   mildly reduced RVSF,  trivial TR  . TRANSURETHRAL RESECTION OF BLADDER TUMOR N/Adkins 12/01/2015   Procedure: TRANSURETHRAL RESECTION OF BLADDER TUMOR (TURBT);  Surgeon: Sara Rhodes, MD;  Location: [facility];  Service: Urology;  Laterality: N/Adkins;  . TUBAL LIGATION  1974     ROS:   As stated in the HPI and negative for all other systems.  PHYSICAL EXAM BP 114/70   Pulse 68   Ht 5\' 3"  (1.6 m)   Wt 181 lb 9.6 oz (82.4 kg)   LMP  (LMP Unknown)   BMI 32.17 kg/m   GENERAL:  Well appearing NECK:  No jugular venous distention, waveform within normal limits, carotid upstroke brisk and symmetric, no bruits, no thyromegaly LUNGS:  Clear to auscultation bilaterally CHEST:  Unremarkable HEART:  PMI not displaced or sustained,S1 and S2 within normal limits, no S3, no clicks, no rubs, no murmurs, irregular ABD:  Flat, positive bowel sounds normal in frequency in pitch, no bruits, no rebound, no guarding, no midline pulsatile mass, no hepatomegaly, no splenomegaly EXT:  2 plus pulses throughout, no edema, no cyanosis no clubbing   Lab Results  Component Value Date   CHOL 150 04/25/2017   TRIG 139 04/25/2017   HDL 58 04/25/2017   LDLCALC 64 04/25/2017   LDLDIRECT 125 (H) 06/01/2010    ASSESSMENT AND PLAN  ATRIAL FIBRILLATION -  [name] has Adkins CHA2DS2 - VASc score of 3 with Adkins risk of stroke of 3.2%.  At the last visit we started her back on Eliquis.  The patient  tolerates this rhythm and rate control and anticoagulation. We will continue with the meds as listed.  Her labs are follow by Sara Hilding, MD  No change in therapy is indicated.   ESSENTIAL HYPERTENSION, BENIGN -  The blood pressure is at target. No change in medications is indicated. We will continue with therapeutic lifestyle changes (TLC).  CAD - The patient has no new sypmtoms.  No further cardiovascular testing is indicated.  We will continue with aggressive risk reduction and meds as  listed.   DYSLIPIDEMIA - She is at target with her lipids after she was switched to higher dose Crestor.  She will remain on this dose.

## 2017-10-10 ENCOUNTER — Ambulatory Visit (INDEPENDENT_AMBULATORY_CARE_PROVIDER_SITE_OTHER): Payer: Medicare Other | Admitting: Cardiology

## 2017-10-10 ENCOUNTER — Encounter: Payer: Self-pay | Admitting: Cardiology

## 2017-10-10 ENCOUNTER — Other Ambulatory Visit: Payer: Self-pay | Admitting: Family Medicine

## 2017-10-10 VITALS — BP 114/70 | HR 68 | Ht 63.0 in | Wt 181.6 lb

## 2017-10-10 DIAGNOSIS — I482 Chronic atrial fibrillation, unspecified: Secondary | ICD-10-CM

## 2017-10-10 DIAGNOSIS — E785 Hyperlipidemia, unspecified: Secondary | ICD-10-CM | POA: Diagnosis not present

## 2017-10-10 DIAGNOSIS — I2510 Atherosclerotic heart disease of native coronary artery without angina pectoris: Secondary | ICD-10-CM | POA: Diagnosis not present

## 2017-10-10 DIAGNOSIS — I10 Essential (primary) hypertension: Secondary | ICD-10-CM

## 2017-10-10 NOTE — Patient Instructions (Signed)
Medication Instructions:  Continue current medications  If you need a refill on your cardiac medications before your next appointment, please call your pharmacy.  Labwork: None Ordered   Testing/Procedures: None Ordered  Follow-Up: Your physician wants you to follow-up in: 1 Year. You should receive a reminder letter in the mail two months in advance. If you do not receive a letter, please call our office [phone].    Thank you for choosing [facility]!!      

## 2017-10-18 ENCOUNTER — Ambulatory Visit (HOSPITAL_COMMUNITY)
Admission: EM | Admit: 2017-10-18 | Discharge: 2017-10-18 | Disposition: A | Discharge status: Home / Self Care | Payer: Medicare Other | Attending: Emergency Medicine | Admitting: Emergency Medicine

## 2017-10-18 ENCOUNTER — Encounter (HOSPITAL_COMMUNITY): Payer: Self-pay | Admitting: Emergency Medicine

## 2017-10-18 DIAGNOSIS — J069 Acute upper respiratory infection, unspecified: Secondary | ICD-10-CM

## 2017-10-18 DIAGNOSIS — J40 Bronchitis, not specified as acute or chronic: Secondary | ICD-10-CM | POA: Diagnosis not present

## 2017-10-18 MED ORDER — DOXYCYCLINE HYCLATE 100 MG PO CAPS: 100.0000 mg | ORAL_CAPSULE | Freq: Two times a day (BID) | ORAL | 0 refills | Status: DC

## 2017-10-18 NOTE — ED Triage Notes (Signed)
Pt c/o allergies and sneezing, runny nose, HA x1 week

## 2017-10-18 NOTE — ED Provider Notes (Signed)
[place]    CSN: [id number] Arrival date & time: 10/18/17  1001     History   Chief Complaint Chief Complaint  Patient presents with  . Nasal Congestion  . Cough    HPI MICCA MATURA is a 66 y.o. female.   The history is provided by the patient. No language interpreter was used.  URI  Presenting symptoms: congestion, cough and facial pain   Presenting symptoms: no fever   Severity:  Moderate Onset quality:  Gradual Duration:  8 days Timing:  Constant Progression:  Worsening Chronicity:  New Relieved by:  Nothing Worsened by:  Nothing Ineffective treatments:  Rest Associated symptoms: sinus pain   Risk factors: being elderly and sick contacts     Past Medical History:  Diagnosis Date  . Anticoagulated on Coumadin   . Arthritis    wrist, hands -  . Atrial fibrillation, persistent ([place])   . Bladder tumor   . CAD (coronary artery disease) cardiologist-  dr hochrien   a. PTCA to RCA (02/ 1999);  b.  Myoview 5/10:  EF 71%, no ischemia  . Diabetes ([place])   . Full dentures   . GERD (gastroesophageal reflux disease)   . History of colon polyps    2005  hyperplastic  . History of MI (myocardial infarction)    1999  . HLD (hyperlipidemia)   . Hypertension   . Hypothyroidism   . SUI (stress urinary incontinence, female)   . Wears glasses     Patient Active Problem List   Diagnosis Date Noted  . Bronchitis 10/18/2017  . History of bladder cancer 08/24/2017  . Claudication ([place]) 07/21/2017  . Onychomycosis of toenail 06/09/2017  . Chronic right hip pain 03/14/2017  . Acute upper respiratory infection 12/27/2015  . Near syncope 09/11/2015  . Atrial fibrillation with RVR ([place]) 09/11/2015  . Disease of middle ear or mastoid 04/10/2015  . Long-term (current) use of anticoagulants 02/19/2015  . Long QT interval 11/08/2014  . Bradycardia, sinus   . Family history of colon cancer - mother in 52's 06/04/2014  . Diabetes ([place]) 04/11/2014  . Atrial  fibrillation ([place]) 12/18/2013  . Colon polyps 05/06/2011  . DEGENERATIVE JOINT DISEASE, LEFT HIP 07/20/2010  . HLD (hyperlipidemia) 04/29/2009  . Obesity 04/29/2009  . Essential hypertension, benign 04/29/2009  . Coronary atherosclerosis 04/29/2009  . Hypothyroidism 02/23/2007  . MYOCARDIAL INFARCTION, OLD 02/23/2007  . Personal history of colonic adenoma 11/11/2004    Past Surgical History:  Procedure Laterality Date  . CARDIOVASCULAR STRESS TEST  05-01-2009   dr hochrein   normal nuclear study/  no ischemia or scar/  normal LV function and wall motion, ef 71%  . CORONARY ANGIOPLASTY  Feb 1999   PTCA to RCA  . D & C HYSTEROSCOPY/  POLYPECTOMY  05-08-2009  . HYSTEROSCOPY W/D&C  12/07/2012   Procedure: DILATATION AND CURETTAGE /HYSTEROSCOPY;  Surgeon: Elveria Royals, MD;  Location: [facility];  Service: Gynecology;  Laterality: N/A;  Dilitation and curettage /hysteroscopy/biopsey of lower segment of uterus  . TOTAL HIP ARTHROPLASTY Left 12-11-2010  . TRANSTHORACIC ECHOCARDIOGRAM  09-11-2015   mild LVH, ef 55%,  mildly reduced RVSF,  trivial TR  . TRANSURETHRAL RESECTION OF BLADDER TUMOR N/A 12/01/2015   Procedure: TRANSURETHRAL RESECTION OF BLADDER TUMOR (TURBT);  Surgeon: Kathie Rhodes, MD;  Location: [facility];  Service: Urology;  Laterality: N/A;  . TUBAL LIGATION  1974    OB History    No data available  Home Medications    Prior to Admission medications   Medication Sig Start Date End Date Taking? Authorizing Provider  apixaban (ELIQUIS) 5 MG TABS tablet Take 1 tablet (5 mg total) by mouth 2 (two) times daily. 02/28/17  Yes Minus Breeding, MD  calcium carbonate (TUMS - DOSED IN MG ELEMENTAL CALCIUM) 500 MG chewable tablet Chew 1 tablet by mouth as needed for indigestion or heartburn.   Yes [provider]  CARTIA XT 300 MG 24 hr capsule TAKE 1 CAPSULE(300 MG) BY MOUTH DAILY 03/15/17  Yes Minus Breeding, MD  fluticasone (FLONASE) 50 MCG/ACT nasal  spray Place 2 sprays into both nostrils daily. Patient taking differently: Place 2 sprays into both nostrils daily as needed.  11/08/14  Yes Rosemarie Ax, MD  glucose blood [facility] VERIO) test strip Use as instructed 03/14/17  Yes Sela Hilding, MD  Lancet Devices (ONE TOUCH DELICA LANCING DEV) MISC 1 Units by Does not apply route 2 (two) times daily at 8 am and 10 pm. 03/14/17  Yes Sela Hilding, MD  levothyroxine (SYNTHROID, LEVOTHROID) 112 MCG tablet Take 1 tablet (112 mcg total) by mouth daily. 03/17/17  Yes Sela Hilding, MD  metFORMIN (GLUCOPHAGE-XR) 500 MG 24 hr tablet Take 2 tablets (1,000 mg total) by mouth 2 (two) times daily. 10/04/17  Yes Sela Hilding, MD  metFORMIN (GLUCOPHAGE-XR) 500 MG 24 hr tablet TAKE 1 TABLET BY MOUTH DAILY WITH BREAKFAST. INCREASE TO 1 TABLET TWICE DAILY IF NOT HAVING SIDE EFFECTS AFTER 1 WEEK 10/10/17  Yes Sela Hilding, MD  nitroGLYCERIN (NITROSTAT) 0.4 MG SL tablet PLACE 1 TABLET UNDER TONGUE EVERY 5 MINUTES AS NEEDED FOR CHEST PAIN 09/15/15  Yes Rosemarie Ax, MD  [facility] DELICA LANCETS FINE MISC TEST TWICE DAILY( 8AM AND 10PM) 10/03/17  Yes Sela Hilding, MD  rosuvastatin (CRESTOR) 40 MG tablet Take 1 tablet (40 mg total) by mouth daily. 02/28/17  Yes Minus Breeding, MD  carbamide peroxide (DEBROX) 6.5 % otic solution Place 5 drops into both ears 2 (two) times daily. Patient taking differently: Place 5 drops into both ears once a week.  12/26/15   Veatrice Bourbon, MD  doxycycline (VIBRAMYCIN) 100 MG capsule Take 1 capsule (100 mg total) by mouth 2 (two) times daily. 59/56/38   Nicola Heinemann, Jeanett Schlein, NP    Family History Family History  Problem Relation Age of Onset  . Diabetes Mother   . Heart disease Mother   . Colon cancer Mother 30  . Heart disease Father   . Cancer Sister        Lung  . Cancer Brother        intestinal  . Cancer Brother        throat ca  . Cancer Brother        bladder  . Breast cancer  Neg Hx     Social History Social History  Substance Use Topics  . Smoking status: Former Smoker    Packs/day: 1.50    Years: 15.00    Types: Cigarettes    Quit date: 11/09/1981  . Smokeless tobacco: Never Used  . Alcohol use No     Allergies   Penicillins and Percocet [oxycodone-acetaminophen]   Review of Systems Review of Systems  Constitutional: Negative for fever.  HENT: Positive for congestion, sinus pain and sinus pressure.   Eyes: Positive for discharge.  Respiratory: Positive for cough. Negative for chest tightness.   Cardiovascular: Negative for chest pain and palpitations.  Gastrointestinal: Negative.   Endocrine: Negative.  Genitourinary: Negative.   Musculoskeletal: Negative.   Skin: Negative.   Allergic/Immunologic: Negative.   Neurological: Negative.   Hematological: Negative.   Psychiatric/Behavioral: Negative.   All other systems reviewed and are negative.    Physical Exam Triage Vital Signs ED Triage Vitals  Enc Vitals Group     BP 10/18/17 1016 130/87     Pulse Rate 10/18/17 1016 67     Resp 10/18/17 1016 14     Temp 10/18/17 1016 98 F (36.7 C)     Temp Source 10/18/17 1017 Oral     SpO2 10/18/17 1016 96 %     Weight --      Height --      Head Circumference --      Peak Flow --      Pain Score 10/18/17 1018 3     Pain Loc --      Pain Edu? --      Excl. in [place]? --    No data found.   Updated Vital Signs BP 127/78 (BP Location: Right Arm)   Pulse 88   Temp 98.5 F (36.9 C) (Oral)   Resp 16   LMP  (LMP Unknown)   SpO2 95%   Visual Acuity Right Eye Distance:   Left Eye Distance:   Bilateral Distance:    Right Eye Near:   Left Eye Near:    Bilateral Near:     Physical Exam  Constitutional: She is oriented to person, place, and time. She appears well-developed and well-nourished. She is active and cooperative. No distress.  HENT:  Head: Normocephalic.  Right Ear: Tympanic membrane is retracted.  Left Ear: Tympanic  membrane is retracted.  Nose: Mucosal edema present. Right sinus exhibits maxillary sinus tenderness. Left sinus exhibits maxillary sinus tenderness.  Mouth/Throat: Uvula is midline, oropharynx is clear and moist and mucous membranes are normal.  No mastoid tenderness or swelling bilaterally  Eyes: Pupils are equal, round, and reactive to light. EOM and lids are normal. Right conjunctiva is injected. Left conjunctiva is injected.  Neck: Trachea normal and normal range of motion. No tracheal deviation present.  Cardiovascular: Regular rhythm, normal heart sounds and normal pulses.   No murmur heard. Pulmonary/Chest: Effort normal and breath sounds normal.  Abdominal: Soft. Bowel sounds are normal. There is no tenderness.  Musculoskeletal: Normal range of motion.  Lymphadenopathy:    She has no cervical adenopathy.  Neurological: She is alert and oriented to person, place, and time. GCS eye subscore is 4. GCS verbal subscore is 5. GCS motor subscore is 6.  Skin: Skin is warm and dry. No rash noted.  Psychiatric: She has a normal mood and affect. Her speech is normal and behavior is normal.  Nursing note and vitals reviewed.    UC Treatments / Results  Labs (all labs ordered are listed, but only abnormal results are displayed) Labs Reviewed - No data to display  EKG  EKG Interpretation None       Radiology No results found.  Procedures Procedures (including critical care time)  Medications Ordered in UC Medications - No data to display   Initial Impression / Assessment and Plan / UC Course  I have reviewed the triage vital signs and the nursing notes.  Pertinent labs & imaging results that were available during my care of the patient were reviewed by me and considered in my medical decision making (see chart for details).     Treating for acute bacterial maxillary sinusitis/ bronchitis with  doxycycline. Pt aware of risks and benefits of abx se, wishes to take, last use  was >1 yr ago. Pt will follow up with their PCP for recheck, sooner if worse. Pt verbalized understanding to this provider regarding plan of care.   Final Clinical Impressions(s) / UC Diagnoses   Final diagnoses:  Bronchitis  Acute upper respiratory infection    New Prescriptions Discharge Medication List as of 10/18/2017 10:42 AM    START taking these medications   Details  doxycycline (VIBRAMYCIN) 100 MG capsule Take 1 capsule (100 mg total) by mouth 2 (two) times daily., Starting Tue 10/18/2017, Print         Controlled Substance Prescriptions    Markie Heffernan, Jeanett Schlein, NP 58/59/29 1701

## 2017-10-18 NOTE — Discharge Instructions (Signed)
Treating for acute bacterial maxillary sinusitis/ bronchitis with doxycycline. Pt aware of risks and benefits of abx se, wishes to take, last use was >1 yr ago. Pt will follow up with their PCP for recheck, sooner if worse. Pt verbalized understanding to this provider regarding plan of care.

## 2017-12-08 ENCOUNTER — Encounter: Payer: Self-pay | Admitting: Family Medicine

## 2017-12-08 ENCOUNTER — Ambulatory Visit (INDEPENDENT_AMBULATORY_CARE_PROVIDER_SITE_OTHER): Payer: Medicare Other | Admitting: Family Medicine

## 2017-12-08 ENCOUNTER — Other Ambulatory Visit: Payer: Self-pay

## 2017-12-08 DIAGNOSIS — M545 Low back pain, unspecified: Secondary | ICD-10-CM

## 2017-12-08 MED ORDER — CYCLOBENZAPRINE HCL 5 MG PO TABS: 5.0000 mg | ORAL_TABLET | Freq: Three times a day (TID) | ORAL | 1 refills | Status: DC | PRN

## 2017-12-08 NOTE — Progress Notes (Signed)
Subjective:    Patient ID: [name] , female   DOB: [birth year] , 66 y.o..   MRN: [id number]  HPI  [name] is here for  Chief Complaint  Patient presents with  . Back Pain    1. BACK PAIN  Lower back pain mostly above right buttock that began 2 days ago after bending over to pick something up, she heard a "swoosh". Pain is described as sharp. Patient has tried Tylenol. Pain radiates across low back. History of trauma or injury: no Prior history of similar pain: no although admits to arthritis in her back in the pas History of cancer: yes, bladder cancer 2 years ago. Was treated and in remission Weak immune system:  no History of IV drug use: no History of steroid use: no  Symptoms Incontinence of bowel or bladder: has hx of stress incontinence in the past Numbness of leg:  no Fever: no Rest or Night pain: no Weight Loss:  no Rash: no  Patient believes a muscle strain might be causing their pain.  ROS see HPI Smoking Status noted.  Past Medical History: Patient Active Problem List   Diagnosis Date Noted  . Bronchitis 10/18/2017  . History of bladder cancer 08/24/2017  . Claudication ([place]) 07/21/2017  . Onychomycosis of toenail 06/09/2017  . Chronic right hip pain 03/14/2017  . Acute upper respiratory infection 12/27/2015  . Back pain 10/20/2015  . Near syncope 09/11/2015  . Atrial fibrillation with RVR ([place]) 09/11/2015  . Disease of middle ear or mastoid 04/10/2015  . Long-term (current) use of anticoagulants 02/19/2015  . Long QT interval 11/08/2014  . Bradycardia, sinus   . Family history of colon cancer - mother in 72's 06/04/2014  . Diabetes ([place]) 04/11/2014  . Atrial fibrillation ([place]) 12/18/2013  . Colon polyps 05/06/2011  . DEGENERATIVE JOINT DISEASE, LEFT HIP 07/20/2010  . HLD (hyperlipidemia) 04/29/2009  . Obesity 04/29/2009  . Essential hypertension, benign 04/29/2009  . Coronary atherosclerosis 04/29/2009  . Hypothyroidism  02/23/2007  . MYOCARDIAL INFARCTION, OLD 02/23/2007  . Personal history of colonic adenoma 11/11/2004    Medications: reviewed  Social Hx:  reports that she quit smoking about 36 years ago. Her smoking use included cigarettes. She has a 22.50 pack-year smoking history. she has never used smokeless tobacco.   Objective:   BP 118/62   Pulse 77   Temp 97.7 F (36.5 C) (Oral)   Ht 5\' 3"  (1.6 m)   Wt 179 lb 9.6 oz (81.5 kg)   LMP  (LMP Unknown)   SpO2 96%   BMI 31.81 kg/m  Physical Exam  Gen: NAD, alert, cooperative with exam, well-appearing Back Exam:  Inspection: Unremarkable  Palpable tenderness: Tender to palpation in right paraspinal region   Range of Motion: Limited in all directions secondary to pain Leg strength: Quad: 5/5 Hamstring: 5/5 Hip flexor: 5/5 Hip abductors: 5/5  Strength at foot: Plantar-flexion: 5/5 Dorsi-flexion: 5/5 Eversion: 5/5 Inversion: 5/5  Sensory change: Gross sensation intact to all lumbar and sacral dermatomes.  Reflexes: 2+ at both patellar tendons Gait unremarkable. SLR laying: unable to perform as patient could not lay down flat secondary to pain   Assessment & Plan:  Back pain Acute on chronic back pain after bending over and lifting, likely muscle strain. No red flag symptoms. Of note, patient does have hx of bladder cancer but notes that she is in remission after it was surgically removed. Neurovascularly intact on exam.  - Tylenol and Flexeril PRN -  Avoid NSAIDs due to hx of CAD  - Return precautions discussed  Meds ordered this encounter  Medications  . cyclobenzaprine (FLEXERIL) 5 MG tablet    Sig: Take 1 tablet (5 mg total) by mouth 3 (three) times daily as needed for muscle spasms.    Dispense:  30 tablet    Refill:  1    Smitty Cords, MD [place], PGY-3

## 2017-12-08 NOTE — Patient Instructions (Addendum)
Thank you for coming in today, it was so nice to see you! Today we talked about:    Back Pain, Adult Back pain is very common. The pain often gets better over time. The cause of back pain is usually not dangerous. Most people can learn to manage their back pain on their own. Follow these instructions at home: Watch your back pain for any changes. The following actions may help to lessen any pain you are feeling:  Stay active. Start with short walks on flat ground if you can. Try to walk farther each day.  Exercise regularly as told by your doctor. Exercise helps your back heal faster. It also helps avoid future injury by keeping your muscles strong and flexible.  Do not sit, drive, or stand in one place for more than 30 minutes.  Do not stay in bed. Resting more than 1-2 days can slow down your recovery.  Be careful when you bend or lift an object. Use good form when lifting: ? Bend at your knees. ? Keep the object close to your body. ? Do not twist.  Sleep on a firm mattress. Lie on your side, and bend your knees. If you lie on your back, put a pillow under your knees.  Take medicines only as told by your doctor.  Put ice on the injured area. ? Put ice in a plastic bag. ? Place a towel between your skin and the bag. ? Leave the ice on for 20 minutes, 2-3 times a day for the first 2-3 days. After that, you can switch between ice and heat packs.  Avoid feeling anxious or stressed. Find good ways to deal with stress, such as exercise.  Maintain a healthy weight. Extra weight puts stress on your back.  Contact a doctor if:  You have pain that does not go away with rest or medicine.  You have worsening pain that goes down into your legs or buttocks.  You have pain that does not get better in one week.  You have pain at night.  You lose weight.  You have a fever or chills. Get help right away if:  You cannot control when you poop (bowel movement) or pee (urinate).  Your  arms or legs feel weak.  Your arms or legs lose feeling (numbness).  You feel sick to your stomach (nauseous) or throw up (vomit).  You have belly (abdominal) pain.  You feel like you may pass out (faint). This information is not intended to replace advice given to you by your health care provider. Make sure you discuss any questions you have with your health care provider. Document Released: 05/31/2008 Document Revised: 05/20/2016 Document Reviewed: 04/16/2014 Elsevier Interactive Patient Education  [employer].

## 2017-12-09 NOTE — Assessment & Plan Note (Signed)
Acute on chronic back pain after bending over and lifting, likely muscle strain. No red flag symptoms. Of note, patient does have hx of bladder cancer but notes that she is in remission after it was surgically removed. Neurovascularly intact on exam.  - Tylenol and Flexeril PRN - Avoid NSAIDs due to hx of CAD  - Return precautions discussed

## 2018-02-07 ENCOUNTER — Encounter: Payer: Self-pay | Admitting: Internal Medicine

## 2018-02-07 ENCOUNTER — Ambulatory Visit (INDEPENDENT_AMBULATORY_CARE_PROVIDER_SITE_OTHER): Payer: Medicare Other | Admitting: Internal Medicine

## 2018-02-07 ENCOUNTER — Other Ambulatory Visit: Payer: Self-pay

## 2018-02-07 VITALS — BP 114/68 | HR 79 | Temp 97.7°F | Ht 63.0 in | Wt 177.4 lb

## 2018-02-07 DIAGNOSIS — B9689 Other specified bacterial agents as the cause of diseases classified elsewhere: Secondary | ICD-10-CM | POA: Diagnosis not present

## 2018-02-07 DIAGNOSIS — J329 Chronic sinusitis, unspecified: Secondary | ICD-10-CM

## 2018-02-07 MED ORDER — DOXYCYCLINE HYCLATE 100 MG PO TABS: 100.0000 mg | ORAL_TABLET | Freq: Two times a day (BID) | ORAL | 0 refills | Status: DC

## 2018-02-07 NOTE — Patient Instructions (Signed)
I have prescribed Doxycyline, an antibiotic, to treat the bacterial sinus infection. You should take this twice per day for 7 days. Please continue your allergy medicine. Nasal saline spray can help with congestion. Please return if your symptoms do not improve after taking the antibiotic, if you develop fevers, or have trouble breathing.

## 2018-02-07 NOTE — Progress Notes (Signed)
   Subjective:    [name] - 67 y.o. female MRN [id number]  Date of birth: 1951-01-25  HPI  [name] is here for congestion. Has been present for >7 days. Endorses mild sore throat and facial pressure. No cough or fevers at home. She was feeling better and then suddenly she felt worse. She has continued to use her Flonase and OTC anti-histamine for history of allergies.   -  reports that she quit smoking about 36 years ago. Her smoking use included cigarettes. She has a 22.50 pack-year smoking history. she has never used smokeless tobacco. - Review of Systems: Per HPI. - Past Medical History: Patient Active Problem List   Diagnosis Date Noted  . Bronchitis 10/18/2017  . History of bladder cancer 08/24/2017  . Claudication ([place]) 07/21/2017  . Onychomycosis of toenail 06/09/2017  . Chronic right hip pain 03/14/2017  . Acute upper respiratory infection 12/27/2015  . Back pain 10/20/2015  . Near syncope 09/11/2015  . Atrial fibrillation with RVR ([place]) 09/11/2015  . Disease of middle ear or mastoid 04/10/2015  . Long-term (current) use of anticoagulants 02/19/2015  . Long QT interval 11/08/2014  . Bradycardia, sinus   . Family history of colon cancer - mother in 51's 06/04/2014  . Diabetes ([place]) 04/11/2014  . Atrial fibrillation ([place]) 12/18/2013  . Colon polyps 05/06/2011  . DEGENERATIVE JOINT DISEASE, LEFT HIP 07/20/2010  . HLD (hyperlipidemia) 04/29/2009  . Obesity 04/29/2009  . Essential hypertension, benign 04/29/2009  . Coronary atherosclerosis 04/29/2009  . Hypothyroidism 02/23/2007  . MYOCARDIAL INFARCTION, OLD 02/23/2007  . Personal history of colonic adenoma 11/11/2004   - Medications: reviewed and updated   Objective:   Physical Exam BP 114/68   Pulse 79   Temp 97.7 F (36.5 C) (Oral)   Ht 5\' 3"  (1.6 m)   Wt 177 lb 6.4 oz (80.5 kg)   LMP  (LMP Unknown)   SpO2 96%   BMI 31.42 kg/m  Gen: NAD, alert, cooperative with exam, appears ill but non-toxic    HEENT: NCAT, PERRL, clear conjunctiva, oropharynx clear without tonsillar hypertrophy or exudates, supple neck, TMs normal bilaterally, TTP over maxillary sinuses bilaterally, nasal turbinates mildly edematous  CV: RRR, good S1/S2, no murmur, no edema, capillary refill brisk  Resp: CTABL, no wheezes, non-labored    Assessment & Plan:   1. Bacterial sinusitis History concerning for acute bacterial sinusitis given time course and acute worsening consistent with secondary sickening. Will treat with Doxycyline given history of severe itching to PCNs. Recommended continue allergy regimen. Nasal saline spray also recommended. Return precautions discussed.  - doxycycline (VIBRA-TABS) 100 MG tablet; Take 1 tablet (100 mg total) by mouth 2 (two) times daily.  Dispense: 14 tablet; Refill: 0  Phill Myron, D.O. 02/07/2018, 11:27 AM PGY-3, [place]

## 2018-02-16 ENCOUNTER — Ambulatory Visit: Payer: Medicare Other | Admitting: Family Medicine

## 2018-02-19 ENCOUNTER — Other Ambulatory Visit: Payer: Self-pay | Admitting: Cardiology

## 2018-02-19 ENCOUNTER — Other Ambulatory Visit: Payer: Self-pay | Admitting: Family Medicine

## 2018-02-20 NOTE — Telephone Encounter (Signed)
Will provide one month of refill of thyroid medication because I don't want her to run out. I am sending 30 days as I don't want her to pay for 90 pills in case we need to change her dose. Please have her schedule my next available appt to recheck her thyroid.

## 2018-02-21 DIAGNOSIS — Z8551 Personal history of malignant neoplasm of bladder: Secondary | ICD-10-CM | POA: Diagnosis not present

## 2018-02-21 NOTE — Telephone Encounter (Signed)
Pt has an appt on 03/15/2018 with Dr. Lindell Noe. Ottis Stain, CMA

## 2018-03-05 ENCOUNTER — Other Ambulatory Visit: Payer: Self-pay | Admitting: Cardiology

## 2018-03-15 ENCOUNTER — Encounter: Payer: Self-pay | Admitting: Family Medicine

## 2018-03-15 ENCOUNTER — Other Ambulatory Visit: Payer: Self-pay

## 2018-03-15 ENCOUNTER — Ambulatory Visit (INDEPENDENT_AMBULATORY_CARE_PROVIDER_SITE_OTHER): Payer: Medicare Other | Admitting: Family Medicine

## 2018-03-15 VITALS — BP 120/76 | HR 93 | Temp 98.2°F | Ht 63.0 in | Wt 179.0 lb

## 2018-03-15 DIAGNOSIS — E118 Type 2 diabetes mellitus with unspecified complications: Secondary | ICD-10-CM

## 2018-03-15 DIAGNOSIS — E039 Hypothyroidism, unspecified: Secondary | ICD-10-CM

## 2018-03-15 DIAGNOSIS — R0683 Snoring: Secondary | ICD-10-CM

## 2018-03-15 DIAGNOSIS — I10 Essential (primary) hypertension: Secondary | ICD-10-CM | POA: Diagnosis not present

## 2018-03-15 LAB — POCT GLYCOSYLATED HEMOGLOBIN (HGB A1C): Hemoglobin A1C: 6.1

## 2018-03-15 NOTE — Patient Instructions (Signed)
It was a pleasure to see you today! Thank you for choosing [facility] for your primary care. [name] was seen for thyroid, diabetes.   Our plans for today were:  Keep your medicines the same unless I call about your thyroid.   Schedule your sleep study.   You should return to our clinic to see Dr. Lindell Noe in 6 months for physical.   Best,  Dr. Lindell Noe

## 2018-03-15 NOTE — Progress Notes (Signed)
   CC: DM follow up   HPI Due for eye exam, she will schedule. Taking metformin xr 1000mg  BID. Taking sugars BID (in the morning 120-140). Attempts to eat fewer carbs. Drinks lots of water. No noted hypoglycemic episodes.   Excessive daytime sleepiness, snoring. No witnessed apneas from husband, but she hasn't really asked him. No hx of sleep study.   Thyroid - more tired lately. Last checked 1 year ago. Taking synthroid every day faithfully.   Filed Weights   03/15/18 1408  Weight: 179 lb (81.2 kg)   Wt Readings from Last 3 Encounters:  03/15/18 179 lb (81.2 kg)  02/07/18 177 lb 6.4 oz (80.5 kg)  12/08/17 179 lb 9.6 oz (81.5 kg)   Exercise - taking care of her son with hx of neck fusion recently. Chasing a 69 year old. Walks 3 times per week.   ROS: denies CP, SOB, abd pain ,dysuria, changes in BMs.    CC, SH/smoking status, and VS noted  Objective: BP 120/76   Pulse 93   Temp 98.2 F (36.8 C) (Oral)   Ht 5\' 3"  (1.6 m)   Wt 179 lb (81.2 kg)   LMP  (LMP Unknown)   SpO2 98%   BMI 31.71 kg/m  Gen: NAD, alert, cooperative, and pleasant. HEENT: NCAT, EOMI, PERRL CV: RRR, no murmur Resp: CTAB, no wheezes, non-labored Ext: No edema, warm Neuro: Alert and oriented, Speech clear, No gross deficits  Assessment and plan:  Hypothyroidism Recheck TSH today. Patient suspects her dose may be off as she is very tired lately. Lab results show oversupplementation, called patient and sent lower dose.   Essential hypertension, benign Continue current meds, well controlled.   Diabetes ([place]) Continue current meds, continue lifestyle modifications. a1c well controlled today. Recheck 6  Months, encouraged eye exam.   Snoring Excessive daytime sleepiness and snoring, will order sleep study.    Orders Placed This Encounter  Procedures  . CBC  . Basic metabolic panel  . TSH  . HgB A1c  . PSG Sleep Study    Standing Status:   Future    Standing Expiration Date:   03/16/2019   Order Specific Question:   Where should this test be performed:    Answer:   [place] ordered this encounter  Medications  . levothyroxine (SYNTHROID, LEVOTHROID) 100 MCG tablet    Sig: Take 1 tablet (100 mcg total) by mouth daily.    Dispense:  90 tablet    Refill:  3   Health Maintenance reviewed - patient asked to schedule diabetic eye exam.  Ralene Ok, MD, PGY2 03/16/2018 4:30 PM

## 2018-03-16 ENCOUNTER — Encounter: Payer: Self-pay | Admitting: Family Medicine

## 2018-03-16 DIAGNOSIS — G4733 Obstructive sleep apnea (adult) (pediatric): Secondary | ICD-10-CM | POA: Insufficient documentation

## 2018-03-16 LAB — BASIC METABOLIC PANEL
BUN/Creatinine Ratio: 20 (ref 12–28)
BUN: 19 mg/dL (ref 8–27)
CO2: 22 mmol/L (ref 20–29)
Calcium: 9.6 mg/dL (ref 8.7–10.3)
Chloride: 102 mmol/L (ref 96–106)
Creatinine, Ser: 0.96 mg/dL (ref 0.57–1.00)
GFR calc Af Amer: 70 mL/min/{1.73_m2} (ref >59)
GFR calc non Af Amer: 61 mL/min/{1.73_m2} (ref >59)
Glucose: 128 mg/dL — ABNORMAL HIGH (ref 65–99)
Potassium: 4.8 mmol/L (ref 3.5–5.2)
Sodium: 141 mmol/L (ref 134–144)

## 2018-03-16 LAB — CBC
Hematocrit: 40.2 % (ref 34.0–46.6)
Hemoglobin: 12.9 g/dL (ref 11.1–15.9)
MCH: 28.8 pg (ref 26.6–33.0)
MCHC: 32.1 g/dL (ref 31.5–35.7)
MCV: 90 fL (ref 79–97)
Platelets: 271 10*3/uL (ref 150–379)
RBC: 4.48 x10E6/uL (ref 3.77–5.28)
RDW: 13.6 % (ref 12.3–15.4)
WBC: 9.1 10*3/uL (ref 3.4–10.8)

## 2018-03-16 LAB — TSH: TSH: 0.366 u[IU]/mL — ABNORMAL LOW (ref 0.450–4.500)

## 2018-03-16 MED ORDER — LEVOTHYROXINE SODIUM 100 MCG PO TABS: 100.0000 ug | ORAL_TABLET | Freq: Every day | ORAL | 3 refills | Status: DC

## 2018-03-16 NOTE — Assessment & Plan Note (Signed)
Continue current meds, well controlled.

## 2018-03-16 NOTE — Assessment & Plan Note (Signed)
Excessive daytime sleepiness and snoring, will order sleep study.

## 2018-03-16 NOTE — Assessment & Plan Note (Signed)
Recheck TSH today. Patient suspects her dose may be off as she is very tired lately. Lab results show oversupplementation, called patient and sent lower dose.

## 2018-03-16 NOTE — Assessment & Plan Note (Signed)
Continue current meds, continue lifestyle modifications. a1c well controlled today. Recheck 6  Months, encouraged eye exam.

## 2018-04-04 ENCOUNTER — Ambulatory Visit (HOSPITAL_BASED_OUTPATIENT_CLINIC_OR_DEPARTMENT_OTHER): Payer: Medicare Other | Attending: Family Medicine | Admitting: Internal Medicine

## 2018-04-04 VITALS — Ht 63.0 in | Wt 179.0 lb

## 2018-04-04 DIAGNOSIS — G4733 Obstructive sleep apnea (adult) (pediatric): Secondary | ICD-10-CM | POA: Diagnosis not present

## 2018-04-04 DIAGNOSIS — E669 Obesity, unspecified: Secondary | ICD-10-CM | POA: Diagnosis not present

## 2018-04-04 DIAGNOSIS — R5383 Other fatigue: Secondary | ICD-10-CM | POA: Diagnosis not present

## 2018-04-04 DIAGNOSIS — I4891 Unspecified atrial fibrillation: Secondary | ICD-10-CM | POA: Diagnosis not present

## 2018-04-04 DIAGNOSIS — E119 Type 2 diabetes mellitus without complications: Secondary | ICD-10-CM | POA: Diagnosis not present

## 2018-04-04 DIAGNOSIS — R0683 Snoring: Secondary | ICD-10-CM | POA: Diagnosis not present

## 2018-04-04 DIAGNOSIS — Z6832 Body mass index (BMI) 32.0-32.9, adult: Secondary | ICD-10-CM | POA: Diagnosis not present

## 2018-04-06 ENCOUNTER — Other Ambulatory Visit: Payer: Self-pay

## 2018-04-06 MED ORDER — METFORMIN HCL ER 500 MG PO TB24: 1000.0000 mg | ORAL_TABLET | Freq: Two times a day (BID) | ORAL | 3 refills | Status: DC

## 2018-04-22 NOTE — Procedures (Signed)
   Patient Name: [name], [name] Date: 04/04/2018 Gender: Female D.O.B: 1951/05/09 Age (years): 67 Referring Provider: Talbert Cage Height (inches): 63 Interpreting Physician: Baird Lyons MD, ABSM Weight (lbs): 179 RPSGT: Carolin Coy BMI: 32 MRN: [id number] Neck Size: 14.50  CLINICAL INFORMATION Sleep Study Type: NPSG Indication for sleep study: Daytime Fatigue, Diabetes, Obesity, Snoring  Epworth Sleepiness Score: 8  SLEEP STUDY TECHNIQUE As per the AASM Manual for the Scoring of Sleep and Associated Events v2.3 (April 2016) with a hypopnea requiring 4% desaturations.  The channels recorded and monitored were frontal, central and occipital EEG, electrooculogram (EOG), submentalis EMG (chin), nasal and oral airflow, thoracic and abdominal wall motion, anterior tibialis EMG, snore microphone, electrocardiogram, and pulse oximetry.  MEDICATIONS Medications self-administered by patient taken the night of the study : none reported  SLEEP ARCHITECTURE The study was initiated at 10:43:36 PM and ended at 4:50:40 AM.  Sleep onset time was 8.2 minutes and the sleep efficiency was 78.6%%. The total sleep time was 288.5 minutes.  Stage REM latency was 139.0 minutes.  The patient spent 15.4%% of the night in stage N1 sleep, 62.0%% in stage N2 sleep, 0.0%% in stage N3 and 22.53% in REM.  Alpha intrusion was absent.  Supine sleep was 66.89%.  RESPIRATORY PARAMETERS The overall apnea/hypopnea index (AHI) was 62.2 per hour. There were 100 total apneas, including 88 obstructive, 3 central and 9 mixed apneas. There were 199 hypopneas and 23 RERAs.  The AHI during Stage REM sleep was 57.2 per hour.  AHI while supine was 70.3 per hour.  The mean oxygen saturation was 90.6%. The minimum SpO2 during sleep was 78.0%.  soft snoring was noted during this study.  CARDIAC DATA The 2 lead EKG demonstrated sinus rhythm. The mean heart rate was 72.3 beats per minute. Other EKG  findings include: Atrial Fibrillation.  LEG MOVEMENT DATA The total PLMS were 0 with a resulting PLMS index of 0.0. Associated arousal with leg movement index was 2.1 .  IMPRESSIONS - Severe obstructive sleep apnea occurred during this study (AHI = 62.2/h). - No significant central sleep apnea occurred during this study (CAI = 0.6/h). - Moderate oxygen desaturation was noted during this study (Min O2 = 78.0%). - The patient snored with soft snoring volume. - EKG findings include Atrial Fibrillation, Mean HR 72/ minute.. - Clinically significant periodic limb movements did not occur during sleep. No significant associated arousals.  DIAGNOSIS - Obstructive Sleep Apnea (327.23 [G47.33 ICD-10])  RECOMMENDATIONS - Therapeutic CPAP titration to determine optimal pressure required to alleviate sleep disordered breathing. - Positional therapy avoiding supine position during sleep. - Be careful with alcohol, sedatives and other CNS depressants that may worsen sleep apnea and disrupt normal sleep architecture. - Sleep hygiene should be reviewed to assess factors that may improve sleep quality. - Weight management and regular exercise should be initiated or continued if appropriate.  [Electronically signed] 04/29/2018 10:38 AM  Baird Lyons MD, ABSM Diplomate, American Board of Sleep Medicine   NPI: [id number]                          [place]  ELECTRONICALLY SIGNED ON:  04/22/2018, 11:49 AM [place] PH: [phone]   FX: [phone] [place]

## 2018-04-29 ENCOUNTER — Telehealth: Payer: Self-pay | Admitting: Family Medicine

## 2018-04-29 DIAGNOSIS — G4733 Obstructive sleep apnea (adult) (pediatric): Secondary | ICD-10-CM

## 2018-04-29 DIAGNOSIS — R0683 Snoring: Secondary | ICD-10-CM | POA: Diagnosis not present

## 2018-04-29 NOTE — Telephone Encounter (Signed)
Called patient to let her know about sleep study results. Let her know that PSG showed OSA, she understood. Will place DME order for CPAP with autotitration.

## 2018-05-05 LAB — HM DIABETES EYE EXAM

## 2018-05-07 ENCOUNTER — Emergency Department (HOSPITAL_COMMUNITY)
Admission: EM | Admit: 2018-05-07 | Discharge: 2018-05-07 | Disposition: A | Discharge status: Home / Self Care | Payer: Medicare Other | Attending: Emergency Medicine | Admitting: Emergency Medicine

## 2018-05-07 ENCOUNTER — Emergency Department (HOSPITAL_COMMUNITY): Payer: Medicare Other

## 2018-05-07 ENCOUNTER — Encounter (HOSPITAL_COMMUNITY): Payer: Self-pay

## 2018-05-07 ENCOUNTER — Other Ambulatory Visit: Payer: Self-pay

## 2018-05-07 DIAGNOSIS — I10 Essential (primary) hypertension: Secondary | ICD-10-CM | POA: Diagnosis not present

## 2018-05-07 DIAGNOSIS — E039 Hypothyroidism, unspecified: Secondary | ICD-10-CM | POA: Insufficient documentation

## 2018-05-07 DIAGNOSIS — E785 Hyperlipidemia, unspecified: Secondary | ICD-10-CM | POA: Diagnosis not present

## 2018-05-07 DIAGNOSIS — E119 Type 2 diabetes mellitus without complications: Secondary | ICD-10-CM | POA: Diagnosis not present

## 2018-05-07 DIAGNOSIS — Z7984 Long term (current) use of oral hypoglycemic drugs: Secondary | ICD-10-CM | POA: Insufficient documentation

## 2018-05-07 DIAGNOSIS — I252 Old myocardial infarction: Secondary | ICD-10-CM | POA: Diagnosis not present

## 2018-05-07 DIAGNOSIS — I2510 Atherosclerotic heart disease of native coronary artery without angina pectoris: Secondary | ICD-10-CM | POA: Insufficient documentation

## 2018-05-07 DIAGNOSIS — Z7901 Long term (current) use of anticoagulants: Secondary | ICD-10-CM | POA: Diagnosis not present

## 2018-05-07 DIAGNOSIS — Z79899 Other long term (current) drug therapy: Secondary | ICD-10-CM | POA: Insufficient documentation

## 2018-05-07 DIAGNOSIS — I481 Persistent atrial fibrillation: Secondary | ICD-10-CM | POA: Insufficient documentation

## 2018-05-07 DIAGNOSIS — Z87891 Personal history of nicotine dependence: Secondary | ICD-10-CM | POA: Insufficient documentation

## 2018-05-07 DIAGNOSIS — R42 Dizziness and giddiness: Secondary | ICD-10-CM | POA: Diagnosis not present

## 2018-05-07 DIAGNOSIS — G44209 Tension-type headache, unspecified, not intractable: Secondary | ICD-10-CM

## 2018-05-07 DIAGNOSIS — R51 Headache: Secondary | ICD-10-CM | POA: Diagnosis present

## 2018-05-07 MED ORDER — ACETAMINOPHEN 500 MG PO TABS
1000.0000 mg | ORAL_TABLET | Freq: Once | ORAL | Status: AC
  Administered 2018-05-07: 1000 mg via ORAL
  Filled 2018-05-07: qty 2

## 2018-05-07 NOTE — ED Notes (Signed)
Pt departed in NAD, refused use of wheelchair.  

## 2018-05-07 NOTE — ED Triage Notes (Signed)
Pt endorses shooting pains from above the left ear to the top of her head with slight nausea, no vomiting. States "it's not a headache" No visual changes or neuro deficits. VSS.

## 2018-05-07 NOTE — ED Provider Notes (Signed)
[facility] EMER[facility]ENC[facility] DEPARTMENT Provider Note   CSN: [id number] Arrival date & time: 05/07/18  1543    History   Chief Complaint Chief Complaint  Patient presents with  . Headache    HPI [name] is a 67 y.o. female.  The history is provided by the patient.  Headache   This is a new problem. The current episode started yesterday. The problem occurs every few minutes. The problem has not changed since onset.The headache is associated with nothing. Pain location: right parietal. Quality: sharp, shooting, superficial pain. The pain is moderate. The pain does not radiate. Pertinent negatives include no fever, no chest pressure, no palpitations, no shortness of breath, no nausea and no vomiting. She has tried nothing for the symptoms.    Past Medical History:  Diagnosis Date  . Anticoagulated on Coumadin   . Arthritis    wrist, hands -  . Atrial fibrillation, persistent ([place])   . Bladder tumor   . CAD (coronary artery disease) cardiologist-  dr hochrien   a. PTCA to RCA (02/ 1999);  b.  Myoview 5/10:  EF 71%, no ischemia  . Diabetes ([place])   . Full dentures   . GERD (gastroesophageal reflux disease)   . History of colon polyps    2005  hyperplastic  . History of MI (myocardial infarction)    1999  . HLD (hyperlipidemia)   . Hypertension   . Hypothyroidism   . SUI (stress urinary incontinence, female)   . Wears glasses     Patient Active Problem List   Diagnosis Date Noted  . Snoring 03/16/2018  . History of bladder cancer 08/24/2017  . Claudication ([place]) 07/21/2017  . Onychomycosis of toenail 06/09/2017  . Chronic right hip pain 03/14/2017  . Back pain 10/20/2015  . Near syncope 09/11/2015  . Atrial fibrillation with RVR ([place]) 09/11/2015  . Disease of middle ear or mastoid 04/10/2015  . Long-term (current) use of anticoagulants 02/19/2015  . Long QT interval 11/08/2014  . Bradycardia, sinus   . Family history of colon cancer - mother in  7's 06/04/2014  . Diabetes ([place]) 04/11/2014  . Atrial fibrillation ([place]) 12/18/2013  . Colon polyps 05/06/2011  . DEGENERATIVE JOINT DISEASE, LEFT HIP 07/20/2010  . HLD (hyperlipidemia) 04/29/2009  . Obesity 04/29/2009  . Essential hypertension, benign 04/29/2009  . Coronary atherosclerosis 04/29/2009  . Hypothyroidism 02/23/2007  . MYOCARDIAL INFARCTION, OLD 02/23/2007  . Personal history of colonic adenoma 11/11/2004    Past Surgical History:  Procedure Laterality Date  . CARDIOVASCULAR STRESS TEST  05-01-2009   dr hochrein   normal nuclear study/  no ischemia or scar/  normal LV function and wall motion, ef 71%  . CORONARY ANGIOPLASTY  Feb 1999   PTCA to RCA  . D & C HYSTEROSCOPY/  POLYPECTOMY  05-08-2009  . HYSTEROSCOPY W/D&C  12/07/2012   Procedure: DILATATION AND CURETTAGE /HYSTEROSCOPY;  Surgeon: Elveria Royals, MD;  Location: [facility];  Service: Gynecology;  Laterality: N/A;  Dilitation and curettage /hysteroscopy/biopsey of lower segment of uterus  . TOTAL HIP ARTHROPLASTY Left 12-11-2010  . TRANSTHORACIC ECHOCARDIOGRAM  09-11-2015   mild LVH, ef 55%,  mildly reduced RVSF,  trivial TR  . TRANSURETHRAL RESECTION OF BLADDER TUMOR N/A 12/01/2015   Procedure: TRANSURETHRAL RESECTION OF BLADDER TUMOR (TURBT);  Surgeon: Kathie Rhodes, MD;  [facility];  Service: Urology;  Laterality: N/A;  . TUBAL LIGATION  1974     OB History  None      Home Medications    Prior to Admission medications   Medication Sig Start Date End Date Taking? Authorizing Provider  calcium carbonate (TUMS - DOSED IN MG ELEMENTAL CALCIUM) 500 MG chewable tablet Chew 1 tablet by mouth as needed for indigestion or heartburn.   Yes [provider]  CARTIA XT 300 MG 24 hr capsule TAKE 1 CAPSULE(300 MG) BY MOUTH DAILY 03/06/18  Yes Minus Breeding, MD  cyclobenzaprine (FLEXERIL) 5 MG tablet Take 1 tablet (5 mg total) by mouth 3 (three) times daily as needed for muscle  spasms. 12/08/17  Yes Gambino, Arlie Solomons, MD  ELIQUIS 5 MG TABS tablet TAKE 1 TABLET(5 MG) BY MOUTH TWICE DAILY 02/20/18  Yes Minus Breeding, MD  levothyroxine (SYNTHROID, LEVOTHROID) 100 MCG tablet Take 1 tablet (100 mcg total) by mouth daily. 03/16/18  Yes Sela Hilding, MD  loratadine (CLARITIN) 10 MG tablet Take 10 mg by mouth daily as needed for allergies.   Yes [provider]  metFORMIN (GLUCOPHAGE-XR) 500 MG 24 hr tablet Take 2 tablets (1,000 mg total) by mouth 2 (two) times daily. 04/06/18  Yes Sela Hilding, MD  nitroGLYCERIN (NITROSTAT) 0.4 MG SL tablet PLACE 1 TABLET UNDER TONGUE EVERY 5 MINUTES AS NEEDED FOR CHEST PAIN 09/15/15  Yes Rosemarie Ax, MD  rosuvastatin (CRESTOR) 40 MG tablet TAKE 1 TABLET(40 MG) BY MOUTH DAILY 02/20/18  Yes Minus Breeding, MD  carbamide peroxide (DEBROX) 6.5 % otic solution Place 5 drops into both ears 2 (two) times daily. Patient taking differently: Place 5 drops into both ears once a week.  12/26/15   Veatrice Bourbon, MD  glucose blood (ONETOUCH VERIO) test strip Use as instructed 03/14/17   Sela Hilding, MD  Lancet Devices (ONE TOUCH DELICA LANCING DEV) [place] 1 Units by Does not apply route 2 (two) times daily at 8 am and 10 pm. 03/14/17   Sela Hilding, MD  [facility] DELICA LANCETS FINE MISC TEST TWICE DAILY( 8AM AND 10PM) 10/03/17   Sela Hilding, MD  diltiazem (DILACOR XR) 120 MG 24 hr capsule Take 1 capsule (120 mg total) by mouth daily. 09/15/11 05/15/12  Minus Breeding, MD    Family History Family History  Problem Relation Age of Onset  . Diabetes Mother   . Heart disease Mother   . Colon cancer Mother 24  . Heart disease Father   . Cancer Sister        Lung  . Cancer Brother        intestinal  . Cancer Brother        throat ca  . Cancer Brother        bladder  . Breast cancer Neg Hx     Social History Social History   Tobacco Use  . Smoking status: Former Smoker    Packs/day: 1.50    Years:  15.00    Pack years: 22.50    Types: Cigarettes    Last attempt to quit: 11/09/1981    Years since quitting: 36.5  . Smokeless tobacco: Never Used  Substance Use Topics  . Alcohol use: No  . Drug use: No     Allergies   Penicillins and Percocet [oxycodone-acetaminophen]   Review of Systems Review of Systems  Constitutional: Negative for chills and fever.  HENT: Negative for ear pain and sore throat.   Eyes: Negative for pain and visual disturbance.  Respiratory: Negative for cough and shortness of breath.   Cardiovascular: Negative for chest pain and  palpitations.  Gastrointestinal: Negative for abdominal pain, nausea and vomiting.  Genitourinary: Negative for dysuria and hematuria.  Musculoskeletal: Negative for arthralgias and back pain.  Skin: Negative for color change and rash.  Neurological: Positive for headaches. Negative for seizures, syncope and numbness.  All other systems reviewed and are negative.    Physical Exam Updated Vital Signs BP (!) 143/77 (BP Location: Right Arm)   Pulse 85   Temp 98.3 F (36.8 C) (Oral)   Resp 20   Ht 5\' 3"  (1.6 m)   Wt 81.2 kg (179 lb)   LMP  (LMP Unknown)   SpO2 98%   BMI 31.71 kg/m   Physical Exam  Constitutional: She is oriented to person, place, and time. She appears well-developed and well-nourished. No distress.  HENT:  Head: Normocephalic and atraumatic.  No tenderness overlying either temporal artery. Scalp is atraumatic. No tenderness  Eyes: Conjunctivae are normal.  Neck: Neck supple.  Cardiovascular: Normal rate and regular rhythm.  No murmur heard. Pulmonary/Chest: Effort normal and breath sounds normal. No respiratory distress.  Abdominal: Soft. There is no tenderness.  Musculoskeletal: She exhibits no edema.  Neurological: She is alert and oriented to person, place, and time. She has normal strength. She displays normal reflexes. Coordination and gait normal. GCS eye subscore is 4. GCS verbal subscore is  5. GCS motor subscore is 6.  Skin: Skin is warm and dry.  Psychiatric: She has a normal mood and affect.  Nursing note and vitals reviewed.    ED Treatments / Results  Labs (all labs ordered are listed, but only abnormal results are displayed) Labs Reviewed - No data to display  EKG None  Radiology Ct Head Wo Contrast  Result Date: 05/07/2018 CLINICAL DATA:  Shooting pains above the left ear into the top of the head. Nausea and dizziness. Symptoms today. EXAM: CT HEAD WITHOUT CONTRAST TECHNIQUE: Contiguous axial images were obtained from the base of the skull through the vertex without intravenous contrast. COMPARISON:  Head CT scan 10/29/2014. FINDINGS: Brain: No evidence of acute infarction, hemorrhage, hydrocephalus, extra-axial collection or mass lesion/mass effect. Vascular: Atherosclerosis noted. Skull: Intact. Sinuses/Orbits: No acute finding. Remote fracture medial wall of the left orbit is unchanged. Other: None. IMPRESSION: No acute abnormality. Atherosclerosis. Electronically Signed   By: Inge Rise M.D.   On: 05/07/2018 16:36    Procedures Procedures (including critical care time)  Medications Ordered in ED Medications  acetaminophen (TYLENOL) tablet 1,000 mg (1,000 mg Oral Given 05/07/18 2021)     Initial Impression / Assessment and Plan / ED Course  I have reviewed the triage vital signs and the nursing notes.  Pertinent labs & imaging results that were available during my care of the patient were reviewed by me and considered in my medical decision making (see chart for details).    Patient is a 67 year old female who presents with right-sided headache.  She reports that since yesterday, she has been having intermittent episodes of sharp pain that starts on her scalp behind and above her right ear and radiates up towards the apex of her scalp.  She denies any history of similar headaches.  She had a CT scan performed in triage which was negative for any acute  process.  She has not had any jaw claudication.  She has no tenderness of her temporal artery.  The distribution of her headaches do not suggest trigeminal neuralgia.  She has an intact neurologic exam here.  I suspect these are tension headaches.  She was given Tylenol.  Advised to follow-up with PCP.  Return precautions discussed in detail.  Patient discharged in stable condition.   Final Clinical Impressions(s) / ED Diagnoses   Final diagnoses:  Acute non intractable tension-type headache    ED Discharge Orders    None       Clifton James, MD 05/07/18 2049    Drenda Freeze, MD 05/10/18 [phone]

## 2018-05-24 ENCOUNTER — Other Ambulatory Visit: Payer: Self-pay

## 2018-05-24 ENCOUNTER — Ambulatory Visit (INDEPENDENT_AMBULATORY_CARE_PROVIDER_SITE_OTHER): Payer: Medicare Other | Admitting: Family Medicine

## 2018-05-24 ENCOUNTER — Encounter: Payer: Self-pay | Admitting: Family Medicine

## 2018-05-24 DIAGNOSIS — G4733 Obstructive sleep apnea (adult) (pediatric): Secondary | ICD-10-CM | POA: Diagnosis not present

## 2018-05-24 DIAGNOSIS — M545 Low back pain, unspecified: Secondary | ICD-10-CM

## 2018-05-24 MED ORDER — TRAMADOL HCL 50 MG PO TABS: 50.0000 mg | ORAL_TABLET | Freq: Four times a day (QID) | ORAL | 0 refills | Status: AC | PRN

## 2018-05-24 NOTE — Patient Instructions (Signed)
It was a pleasure to see you today! Thank you for choosing [facility] for your primary care. [name] was seen for back pain.   Our plans for today were:  Use the tramadol, hopefully this helps  Call me if it is not getting better or getting worse.   Best,  Dr. Lindell Noe

## 2018-05-24 NOTE — Progress Notes (Signed)
   CC: back pain  HPI  Back pain - sharp pain mid lower back all the way across. No known injury. Going to both hips. Has tried tylenol, not helping at all. Unchanged leg weakness. No fever, no incontinence. Flares up about once per year. Had some flexeril left, tried it this time and it helped some. Has tried 10-12 flexeril tablets. She is worried that she needs her R hip replaced. Dr. French Ana did her L hip. 2016 last lumbar films with DDD.   OSA - states she hasn't heard from [facility] yet for CPAP.   ROS: Denies CP, SOB, abdominal pain, dysuria, changes in BMs.   CC, SH/smoking status, and VS noted  Objective: BP 128/70   Pulse 80   Temp 98.2 F (36.8 C) (Oral)   Wt 178 lb (80.7 kg)   LMP  (LMP Unknown)   SpO2 95%   BMI 31.53 kg/m  Gen: NAD, alert, cooperative, and pleasant. HEENT: NCAT, EOMI, PERRL CV: RRR, no murmur Resp: CTAB, no wheezes, non-labored Abd: SNTND, BS present, no guarding or organomegaly. TTP over lumbar spine and paraspinal musculature over R, reproduces sciatica on palpation Ext: No edema, warm Neuro: Alert and oriented, Speech clear, No gross deficits  Assessment and plan:  OSA (obstructive sleep apnea) Messaged [facility] rep to check on CPAP status.  Back pain Pain unchanged with flexeril. Offered repeat lumbar XR, patient would rather try another medicine before doing this. Offered either prednisone dose pack or tramadol, patient states prednisone made her angry. Will try 5 day course of tramadol, call if not improved.    Meds ordered this encounter  Medications  . traMADol (ULTRAM) 50 MG tablet    Sig: Take 1 tablet (50 mg total) by mouth every 6 (six) hours as needed for up to 5 days.    Dispense:  30 tablet    Refill:  0    Ralene Ok, MD, PGY2 05/25/2018 12:09 PM

## 2018-05-25 NOTE — Assessment & Plan Note (Signed)
Messaged [facility] rep to check on CPAP status.

## 2018-05-25 NOTE — Assessment & Plan Note (Signed)
Pain unchanged with flexeril. Offered repeat lumbar XR, patient would rather try another medicine before doing this. Offered either prednisone dose pack or tramadol, patient states prednisone made her angry. Will try 5 day course of tramadol, call if not improved.

## 2018-06-13 ENCOUNTER — Telehealth: Payer: Self-pay | Admitting: *Deleted

## 2018-06-13 NOTE — Telephone Encounter (Signed)
Pt has not heard from [facility] about a sleep apnea machine yet.  I see the orders but no notation that anything has been done.  Printed Demographics and orders and faxed to [facility] @ [phone].  I will also send staff message to [facility] @ [facility]. Aidin Doane, Salome Spotted, CMA

## 2018-06-21 DIAGNOSIS — G4733 Obstructive sleep apnea (adult) (pediatric): Secondary | ICD-10-CM | POA: Diagnosis not present

## 2018-07-05 ENCOUNTER — Other Ambulatory Visit: Payer: Self-pay

## 2018-07-05 MED ORDER — METFORMIN HCL ER 500 MG PO TB24: 1000.0000 mg | ORAL_TABLET | Freq: Two times a day (BID) | ORAL | 3 refills | Status: DC

## 2018-07-10 ENCOUNTER — Encounter (HOSPITAL_COMMUNITY): Payer: Self-pay

## 2018-07-10 ENCOUNTER — Observation Stay (HOSPITAL_COMMUNITY)
Admission: EM | Admit: 2018-07-10 | Discharge: 2018-07-11 | Disposition: A | Discharge status: Home / Self Care | Payer: Medicare Other | Attending: Family Medicine | Admitting: Family Medicine

## 2018-07-10 ENCOUNTER — Other Ambulatory Visit: Payer: Self-pay

## 2018-07-10 DIAGNOSIS — Z7984 Long term (current) use of oral hypoglycemic drugs: Secondary | ICD-10-CM | POA: Diagnosis not present

## 2018-07-10 DIAGNOSIS — I4892 Unspecified atrial flutter: Secondary | ICD-10-CM | POA: Diagnosis not present

## 2018-07-10 DIAGNOSIS — Z87891 Personal history of nicotine dependence: Secondary | ICD-10-CM | POA: Diagnosis not present

## 2018-07-10 DIAGNOSIS — K219 Gastro-esophageal reflux disease without esophagitis: Secondary | ICD-10-CM | POA: Diagnosis not present

## 2018-07-10 DIAGNOSIS — R55 Syncope and collapse: Secondary | ICD-10-CM | POA: Diagnosis not present

## 2018-07-10 DIAGNOSIS — I739 Peripheral vascular disease, unspecified: Secondary | ICD-10-CM | POA: Diagnosis present

## 2018-07-10 DIAGNOSIS — I110 Hypertensive heart disease with heart failure: Secondary | ICD-10-CM | POA: Insufficient documentation

## 2018-07-10 DIAGNOSIS — E669 Obesity, unspecified: Secondary | ICD-10-CM | POA: Diagnosis not present

## 2018-07-10 DIAGNOSIS — E785 Hyperlipidemia, unspecified: Secondary | ICD-10-CM | POA: Diagnosis not present

## 2018-07-10 DIAGNOSIS — Z880 Allergy status to penicillin: Secondary | ICD-10-CM | POA: Insufficient documentation

## 2018-07-10 DIAGNOSIS — I471 Supraventricular tachycardia: Secondary | ICD-10-CM | POA: Diagnosis present

## 2018-07-10 DIAGNOSIS — Z79890 Hormone replacement therapy (postmenopausal): Secondary | ICD-10-CM | POA: Insufficient documentation

## 2018-07-10 DIAGNOSIS — Z86010 Personal history of colonic polyps: Secondary | ICD-10-CM | POA: Diagnosis not present

## 2018-07-10 DIAGNOSIS — R9431 Abnormal electrocardiogram [ECG] [EKG]: Secondary | ICD-10-CM

## 2018-07-10 DIAGNOSIS — I252 Old myocardial infarction: Secondary | ICD-10-CM

## 2018-07-10 DIAGNOSIS — R42 Dizziness and giddiness: Secondary | ICD-10-CM | POA: Diagnosis not present

## 2018-07-10 DIAGNOSIS — Z800 Family history of malignant neoplasm of digestive organs: Secondary | ICD-10-CM | POA: Insufficient documentation

## 2018-07-10 DIAGNOSIS — M1612 Unilateral primary osteoarthritis, left hip: Secondary | ICD-10-CM | POA: Diagnosis not present

## 2018-07-10 DIAGNOSIS — E119 Type 2 diabetes mellitus without complications: Secondary | ICD-10-CM

## 2018-07-10 DIAGNOSIS — Z955 Presence of coronary angioplasty implant and graft: Secondary | ICD-10-CM | POA: Insufficient documentation

## 2018-07-10 DIAGNOSIS — I2510 Atherosclerotic heart disease of native coronary artery without angina pectoris: Secondary | ICD-10-CM | POA: Insufficient documentation

## 2018-07-10 DIAGNOSIS — I4891 Unspecified atrial fibrillation: Secondary | ICD-10-CM | POA: Diagnosis present

## 2018-07-10 DIAGNOSIS — Z7901 Long term (current) use of anticoagulants: Secondary | ICD-10-CM | POA: Diagnosis not present

## 2018-07-10 DIAGNOSIS — I420 Dilated cardiomyopathy: Secondary | ICD-10-CM | POA: Diagnosis not present

## 2018-07-10 DIAGNOSIS — Z885 Allergy status to narcotic agent status: Secondary | ICD-10-CM | POA: Insufficient documentation

## 2018-07-10 DIAGNOSIS — G4733 Obstructive sleep apnea (adult) (pediatric): Secondary | ICD-10-CM | POA: Diagnosis not present

## 2018-07-10 DIAGNOSIS — I5030 Unspecified diastolic (congestive) heart failure: Secondary | ICD-10-CM | POA: Insufficient documentation

## 2018-07-10 DIAGNOSIS — E039 Hypothyroidism, unspecified: Secondary | ICD-10-CM | POA: Diagnosis present

## 2018-07-10 DIAGNOSIS — Z79899 Other long term (current) drug therapy: Secondary | ICD-10-CM | POA: Diagnosis not present

## 2018-07-10 DIAGNOSIS — Z833 Family history of diabetes mellitus: Secondary | ICD-10-CM | POA: Insufficient documentation

## 2018-07-10 DIAGNOSIS — Z8052 Family history of malignant neoplasm of bladder: Secondary | ICD-10-CM | POA: Insufficient documentation

## 2018-07-10 DIAGNOSIS — Z809 Family history of malignant neoplasm, unspecified: Secondary | ICD-10-CM | POA: Insufficient documentation

## 2018-07-10 DIAGNOSIS — E1151 Type 2 diabetes mellitus with diabetic peripheral angiopathy without gangrene: Secondary | ICD-10-CM | POA: Insufficient documentation

## 2018-07-10 DIAGNOSIS — Z6831 Body mass index (BMI) 31.0-31.9, adult: Secondary | ICD-10-CM | POA: Insufficient documentation

## 2018-07-10 DIAGNOSIS — I481 Persistent atrial fibrillation: Secondary | ICD-10-CM | POA: Diagnosis not present

## 2018-07-10 DIAGNOSIS — I071 Rheumatic tricuspid insufficiency: Secondary | ICD-10-CM | POA: Diagnosis not present

## 2018-07-10 DIAGNOSIS — Z8551 Personal history of malignant neoplasm of bladder: Secondary | ICD-10-CM | POA: Insufficient documentation

## 2018-07-10 DIAGNOSIS — Z8249 Family history of ischemic heart disease and other diseases of the circulatory system: Secondary | ICD-10-CM | POA: Insufficient documentation

## 2018-07-10 DIAGNOSIS — Z96642 Presence of left artificial hip joint: Secondary | ICD-10-CM | POA: Insufficient documentation

## 2018-07-10 DIAGNOSIS — Z801 Family history of malignant neoplasm of trachea, bronchus and lung: Secondary | ICD-10-CM | POA: Insufficient documentation

## 2018-07-10 DIAGNOSIS — I4581 Long QT syndrome: Secondary | ICD-10-CM | POA: Diagnosis not present

## 2018-07-10 DIAGNOSIS — I4719 Other supraventricular tachycardia: Secondary | ICD-10-CM | POA: Diagnosis present

## 2018-07-10 DIAGNOSIS — Z9851 Tubal ligation status: Secondary | ICD-10-CM | POA: Insufficient documentation

## 2018-07-10 DIAGNOSIS — R531 Weakness: Secondary | ICD-10-CM | POA: Diagnosis not present

## 2018-07-10 LAB — BASIC METABOLIC PANEL
Anion gap: 9 (ref 5–15)
BUN: 11 mg/dL (ref 8–23)
CO2: 25 mmol/L (ref 22–32)
Calcium: 9.2 mg/dL (ref 8.9–10.3)
Chloride: 106 mmol/L (ref 98–111)
Creatinine, Ser: 1.16 mg/dL — ABNORMAL HIGH (ref 0.44–1.00)
GFR calc Af Amer: 54 mL/min — ABNORMAL LOW (ref >60)
GFR calc non Af Amer: 47 mL/min — ABNORMAL LOW (ref >60)
Glucose, Bld: 107 mg/dL — ABNORMAL HIGH (ref 70–99)
Potassium: 3.9 mmol/L (ref 3.5–5.1)
Sodium: 140 mmol/L (ref 135–145)

## 2018-07-10 LAB — URINALYSIS, ROUTINE W REFLEX MICROSCOPIC
Bacteria, UA: NONE SEEN
Bilirubin Urine: NEGATIVE
Glucose, UA: NEGATIVE mg/dL
Ketones, ur: NEGATIVE mg/dL
Leukocytes, UA: NEGATIVE
Nitrite: NEGATIVE
Protein, ur: 100 mg/dL — AB
Specific Gravity, Urine: 1.021 (ref 1.005–1.030)
pH: 5 (ref 5.0–8.0)

## 2018-07-10 LAB — TROPONIN I: Troponin I: <0.03 ng/mL (ref <0.03)

## 2018-07-10 LAB — CBC
HCT: 38.1 % (ref 36.0–46.0)
Hemoglobin: 12 g/dL (ref 12.0–15.0)
MCH: 28.5 pg (ref 26.0–34.0)
MCHC: 31.5 g/dL (ref 30.0–36.0)
MCV: 90.5 fL (ref 78.0–100.0)
Platelets: 265 10*3/uL (ref 150–400)
RBC: 4.21 MIL/uL (ref 3.87–5.11)
RDW: 13.3 % (ref 11.5–15.5)
WBC: 9.2 10*3/uL (ref 4.0–10.5)

## 2018-07-10 LAB — GLUCOSE, CAPILLARY
Glucose-Capillary: 91 mg/dL (ref 70–99)
Glucose-Capillary: 114 mg/dL — ABNORMAL HIGH (ref 70–99)

## 2018-07-10 LAB — TSH: TSH: 0.747 u[IU]/mL (ref 0.350–4.500)

## 2018-07-10 LAB — CBG MONITORING, ED: Glucose-Capillary: 105 mg/dL — ABNORMAL HIGH (ref 70–99)

## 2018-07-10 MED ORDER — INSULIN ASPART 100 UNIT/ML ~~LOC~~ SOLN: 0.0000 [IU] | Freq: Three times a day (TID) | SUBCUTANEOUS | Status: DC

## 2018-07-10 MED ORDER — ROSUVASTATIN CALCIUM 40 MG PO TABS
40.0000 mg | ORAL_TABLET | Freq: Every day | ORAL | Status: DC
  Administered 2018-07-11: 40 mg via ORAL
  Filled 2018-07-10: qty 1

## 2018-07-10 MED ORDER — LORATADINE 10 MG PO TABS: 10.0000 mg | ORAL_TABLET | Freq: Every day | ORAL | Status: DC | PRN

## 2018-07-10 MED ORDER — DILTIAZEM HCL ER COATED BEADS 180 MG PO CP24
300.0000 mg | ORAL_CAPSULE | Freq: Every day | ORAL | Status: DC
  Administered 2018-07-11: 300 mg via ORAL
  Filled 2018-07-10: qty 1

## 2018-07-10 MED ORDER — SODIUM CHLORIDE 0.9 % IV SOLN
INTRAVENOUS | Status: AC
  Administered 2018-07-10: 19:00:00 via INTRAVENOUS

## 2018-07-10 MED ORDER — APIXABAN 5 MG PO TABS
5.0000 mg | ORAL_TABLET | Freq: Two times a day (BID) | ORAL | Status: DC
  Administered 2018-07-10 – 2018-07-11 (×2): 5 mg via ORAL
  Filled 2018-07-10 (×2): qty 1

## 2018-07-10 MED ORDER — LEVOTHYROXINE SODIUM 100 MCG PO TABS
100.0000 ug | ORAL_TABLET | Freq: Every day | ORAL | Status: DC
  Administered 2018-07-11: 100 ug via ORAL
  Filled 2018-07-10: qty 1

## 2018-07-10 MED ORDER — NITROGLYCERIN 0.4 MG SL SUBL: 0.4000 mg | SUBLINGUAL_TABLET | SUBLINGUAL | Status: DC | PRN

## 2018-07-10 MED ORDER — SODIUM CHLORIDE 0.9% FLUSH
3.0000 mL | Freq: Two times a day (BID) | INTRAVENOUS | Status: DC
  Administered 2018-07-11: 3 mL via INTRAVENOUS

## 2018-07-10 NOTE — H&P (Addendum)
[facility] Admission History and Physical Service Pager: [phone]  Patient name: [name] Medical record number: [id number] Date of birth: 1951-07-03 Age: 67 y.o. Gender: female  Primary Care Provider: Sela Hilding, MD Consultants: none Code Status: full code  Chief Complaint: nearly passing out  Assessment and Plan: [name] is a 67 y.o. female presenting with near syncope . PMH is significant for A-Fib, long QT, claudication, hypothyroidism, .  Near-syncope DDx: A-Fib w/ RVR, Dehydration, orthostatic hypotension, hypothyroid, hypoglycemia. Arhythmia is leading suspicion with hx of Afib and feeling of palpitations immediately after.  Dehydration possible given story but patient appeared clinically euvolemic on exam.  Considered orthostatic hypotension as well but no significant BP change on standing in ED without any fluids given.  Hypoglycemia very unlikely given eating of biscuit in AM and normal glucose on admission. Patient has had one episode of near syncope in the past, several years ago.  More recently patient has noted that she has been able to feel a flutter at times.  Patient reports one episode of feeling her a flutter in the past week.  Earlier today patient is felt her heart palpitations after near-syncopal episode. Only 1 glass of water, 1 cup coffe today, was hot today.  Patient's vitals were remarkable for A-fib respiratory rate 21.  Work-up in the ED was largely unremarkable.  EKG showed A. fib without RVR, BG WNL, Orthostatic vitals: increased HR (74-95) BP unchanged. -Admitted to tele, Attending Dr. Silvio Clayman -Up with assistance -Morning EKG -Telemetry -echo - 500 mL normal saline bolus -TSH -Troponinx3  A-fib without RVR Patient has a medical history significant for A. fib with RVR and is currently medicated with diltiazem 300 mg daily and Eliquis. - Continue home medications -echo  Hypothyroidism Last TSH was 0.366  (03/15/18).  Patient takes Synthroid 100 mcg at home. -Continue home medications  Diabetes type 2 Last A1c was 6.1 (03/15/18).  Patient currently takes metformin 500 mg at home.  States she checks a CBG weekly usually 100-125 - Sensitive SSI, TID with meals  HLD - Stable, on home rosuvastatin 40 mg Daily - Continue home meds  Social stress/anxiety: Patient   FEN/GI: heart healthy/carb Prophylaxis: apixiban  Disposition: medsurg  History of Present Illness:  [name] is a 67 y.o. female presenting with patient was running errands today and had walked in from her handicap parking spot into [employer] when she felt lightheaded and faint.  (the room was spinning) she quickly moved to the counter to support herself but found that she was blacking out and could not support herself.  Her son was nearby and able to catch her as she fell.  Patient noted that she did not lose consciousness shortly afterward sat down for about 5 minutes while she regained her strength.  During that time she drank some water and put a cool cloth on her forehead.  There was no trauma to any part of her body she did not hit her head. She noted that afterward she could feel her heart palpitations.  Patient has had episodes feeling her A-fib in the past.  Most recently she experienced an episode of palpitations once in the past week.  She has had one episode of collapsing the past (years ago).  Patient noted that she has previously worn a cardiac monitor for an extended period but that did not lead to any significant intervention, no significant events were found.  Patient noted that she had only had one  cup of coffee and 1 cup of water to drink today.  Patient suggests that she may have been dehydrated and he did the day made her feel weak.  Patient had eaten a sausage McMuffin sandwich earlier in the day.  Review Of Systems: Per HPI with the following additions:   Review of Systems  Constitutional: Negative for chills,  fever and malaise/fatigue.  Respiratory: Positive for cough and shortness of breath.   Cardiovascular: Positive for palpitations.  Gastrointestinal: Positive for constipation. Negative for blood in stool, diarrhea, nausea and vomiting.  Genitourinary: Negative for dysuria and frequency.  Musculoskeletal: Negative for myalgias.  Neurological: Negative for tremors and loss of consciousness.  Psychiatric/Behavioral: Negative for depression, substance abuse and suicidal ideas.    Patient Active Problem List   Diagnosis Date Noted  . OSA (obstructive sleep apnea) 03/16/2018  . History of bladder cancer 08/24/2017  . Claudication ([place]) 07/21/2017  . Onychomycosis of toenail 06/09/2017  . Chronic right hip pain 03/14/2017  . Back pain 10/20/2015  . Near syncope 09/11/2015  . Atrial fibrillation with RVR ([place]) 09/11/2015  . Disease of middle ear or mastoid 04/10/2015  . Long-term (current) use of anticoagulants 02/19/2015  . Long QT interval 11/08/2014  . Bradycardia, sinus   . Family history of colon cancer - mother in 66's 06/04/2014  . Diabetes ([place]) 04/11/2014  . Atrial fibrillation ([place]) 12/18/2013  . Colon polyps 05/06/2011  . DEGENERATIVE JOINT DISEASE, LEFT HIP 07/20/2010  . HLD (hyperlipidemia) 04/29/2009  . Obesity 04/29/2009  . Essential hypertension, benign 04/29/2009  . Coronary atherosclerosis 04/29/2009  . Hypothyroidism 02/23/2007  . MYOCARDIAL INFARCTION, OLD 02/23/2007  . Personal history of colonic adenoma 11/11/2004    Past Medical History: Past Medical History:  Diagnosis Date  . Anticoagulated on Coumadin   . Arthritis    wrist, hands -  . Atrial fibrillation, persistent ([place])   . Bladder tumor   . CAD (coronary artery disease) cardiologist-  dr hochrien   a. PTCA to RCA (02/ 1999);  b.  Myoview 5/10:  EF 71%, no ischemia  . Diabetes ([place])   . Full dentures   . GERD (gastroesophageal reflux disease)   . History of colon polyps    2005  hyperplastic   . History of MI (myocardial infarction)    1999  . HLD (hyperlipidemia)   . Hypertension   . Hypothyroidism   . SUI (stress urinary incontinence, female)   . Wears glasses     Past Surgical History: Past Surgical History:  Procedure Laterality Date  . CARDIOVASCULAR STRESS TEST  05-01-2009   dr hochrein   normal nuclear study/  no ischemia or scar/  normal LV function and wall motion, ef 71%  . CORONARY ANGIOPLASTY  Feb 1999   PTCA to RCA  . D & C HYSTEROSCOPY/  POLYPECTOMY  05-08-2009  . HYSTEROSCOPY W/D&C  12/07/2012   Procedure: DILATATION AND CURETTAGE /HYSTEROSCOPY;  Surgeon: Elveria Royals, MD;  Location: [facility];  Service: Gynecology;  Laterality: N/A;  Dilitation and curettage /hysteroscopy/biopsey of lower segment of uterus  . TOTAL HIP ARTHROPLASTY Left 12-11-2010  . TRANSTHORACIC ECHOCARDIOGRAM  09-11-2015   mild LVH, ef 55%,  mildly reduced RVSF,  trivial TR  . TRANSURETHRAL RESECTION OF BLADDER TUMOR N/A 12/01/2015   Procedure: TRANSURETHRAL RESECTION OF BLADDER TUMOR (TURBT);  Surgeon: Kathie Rhodes, MD;  Location: [facility];  Service: Urology;  Laterality: N/A;  . TUBAL LIGATION  1974    Social History: Social  History   Tobacco Use  . Smoking status: Former Smoker    Packs/day: 1.50    Years: 15.00    Pack years: 22.50    Types: Cigarettes    Last attempt to quit: 11/09/1981    Years since quitting: 36.6  . Smokeless tobacco: Never Used  Substance Use Topics  . Alcohol use: No  . Drug use: No   Additional social history: Patient mentions that she is responsible for caring for her grandchildren.  There are social difficulties with her adult kids who also live with them and that appears to be a significant source of stress for her and her husband. Please also refer to relevant sections of EMR.  Family History: Family History  Problem Relation Age of Onset  . Diabetes Mother   . Heart disease Mother   . Colon cancer Mother 66  . Heart  disease Father   . Cancer Sister        Lung  . Cancer Brother        intestinal  . Cancer Brother        throat ca  . Cancer Brother        bladder  . Breast cancer Neg Hx    Allergies and Medications: Allergies  Allergen Reactions  . Penicillins Itching    Has patient had a PCN reaction causing immediate rash, facial/tongue/throat swelling, SOB or lightheadedness with hypotension: Yes Has patient had a PCN reaction causing severe rash involving mucus membranes or skin necrosis: No Has patient had a PCN reaction that required hospitalization: No Has patient had a PCN reaction occurring within the last 10 years: Yes If all of the above answers are "NO", then may proceed with Cephalosporin use.   Marland Kitchen Percocet [Oxycodone-Acetaminophen] Itching   No current facility-administered medications on file prior to encounter.    Current Outpatient Medications on File Prior to Encounter  Medication Sig Dispense Refill  . calcium carbonate (TUMS - DOSED IN MG ELEMENTAL CALCIUM) 500 MG chewable tablet Chew 1 tablet by mouth as needed for indigestion or heartburn.    . carbamide peroxide (DEBROX) 6.5 % otic solution Place 5 drops into both ears 2 (two) times daily. (Patient taking differently: Place 5 drops into both ears once a week. ) 15 mL 0  . CARTIA XT 300 MG 24 hr capsule TAKE 1 CAPSULE(300 MG) BY MOUTH DAILY 90 capsule 2  . cyclobenzaprine (FLEXERIL) 5 MG tablet Take 1 tablet (5 mg total) by mouth 3 (three) times daily as needed for muscle spasms. 30 tablet 1  . ELIQUIS 5 MG TABS tablet TAKE 1 TABLET(5 MG) BY MOUTH TWICE DAILY 60 tablet 6  . glucose blood (ONETOUCH VERIO) test strip Use as instructed 100 each 12  . [employer] (ONE TOUCH DELICA LANCING DEV) MISC 1 Units by Does not apply route 2 (two) times daily at 8 am and 10 pm. 1 each 1  . levothyroxine (SYNTHROID, LEVOTHROID) 100 MCG tablet Take 1 tablet (100 mcg total) by mouth daily. 90 tablet 3  . loratadine (CLARITIN) 10 MG  tablet Take 10 mg by mouth daily as needed for allergies.    . metFORMIN (GLUCOPHAGE-XR) 500 MG 24 hr tablet Take 2 tablets (1,000 mg total) by mouth 2 (two) times daily. 120 tablet 3  . nitroGLYCERIN (NITROSTAT) 0.4 MG SL tablet PLACE 1 TABLET UNDER TONGUE EVERY 5 MINUTES AS NEEDED FOR CHEST PAIN 75 tablet 1  . ONETOUCH DELICA LANCETS FINE MISC TEST TWICE DAILY( 8AM  AND 10PM) 100 each 0  . rosuvastatin (CRESTOR) 40 MG tablet TAKE 1 TABLET(40 MG) BY MOUTH DAILY 30 tablet 6  . [DISCONTINUED] diltiazem (DILACOR XR) 120 MG 24 hr capsule Take 1 capsule (120 mg total) by mouth daily. 30 capsule 6    Objective: BP 134/80 (BP Location: Right Arm)   Pulse 79   Temp 98.2 F (36.8 C) (Oral)   Resp 16   Ht 5\' 3"  (1.6 m)   Wt 179 lb 12.8 oz (81.6 kg)   LMP  (LMP Unknown)   SpO2 97%   BMI 31.85 kg/m  Exam: Physical Exam  Constitutional: She appears well-developed and well-nourished. No distress.  HENT:  Head: Normocephalic and atraumatic.  Eyes: Pupils are equal, round, and reactive to light. Conjunctivae and EOM are normal.  Cardiovascular: Normal rate. Exam reveals no gallop and no friction rub.  No murmur heard. Irregularly irregular rhythm  Pulmonary/Chest: Effort normal. No stridor. No respiratory distress. She has no wheezes.  Abdominal: Soft. Bowel sounds are normal. She exhibits no distension. There is no tenderness.  Musculoskeletal: She exhibits no edema, tenderness or deformity.  Lymphadenopathy:    She has no cervical adenopathy.  Neurological: No cranial nerve deficit.  Skin: She is not diaphoretic. No erythema. No pallor.  Psychiatric: She has a normal mood and affect. Her behavior is normal.     Labs and Imaging: CBC BMET  Recent Labs  Lab 07/10/18 1248  WBC 9.2  HGB 12.0  HCT 38.1  PLT 265   Recent Labs  Lab 07/10/18 1248  NA 140  K 3.9  CL 106  CO2 25  BUN 11  CREATININE 1.16*  GLUCOSE 107*  CALCIUM 9.2     No results found.   Matilde Haymaker,  MD 07/10/2018, 5:19 PM PGY-1, [place] Intern pager: [phone], text pages welcome  FPTS Upper-Level Resident Addendum   I have independently interviewed and examined the patient. I have discussed the above with the original author and agree with their documentation. My edits for correction/addition/clarification are in blue. Please see also any attending notes.    Sherene Sires, DO PGY-2, [facility] 07/10/2018 6:48 PM  [place] Service pager: [phone] (text pages welcome through [place])

## 2018-07-10 NOTE — ED Provider Notes (Addendum)
[place] [facility] EMERGENCY DEPARTMENT Provider Note   CSN: [id number] Arrival date & time: 07/10/18  1216     History   Chief Complaint Chief Complaint  Patient presents with  . Dizziness  . Weakness    HPI [name] is a 67 y.o. female.  HPI  67 year old female comes in with chief complaint of dizziness and near fainting.  Patient has history of CAD, A. fib, hypertension and prolonged QT. According to patient she was walking into [employer], when suddenly she started feeling dizzy.  Dizziness is described as initially being lightheadedness.  As the symptoms progressed, she started feeling that she was going to faint and started blacking out.  She also started having spinning sensation towards the end of her episode, which lasted 5 minutes.  Patient's son had to help patient stay upright, and her symptoms improved on its own when she was seated on the chair.  Patient denies any prodrome of nausea, diaphoresis or any cardiac symptoms of chest pain, shortness of breath or palpitations.  She denies any vomiting, diarrhea -and has not had any episodes of dizziness when getting up prior to this episode or thereafter, including when orthostatics were checked in the ER.  Patient also denies any headaches, neck pain, numbness, tingling, vision changes.  No history of strokes or TIAs.  No new medications.  Past Medical History:  Diagnosis Date  . Anticoagulated on Coumadin   . Arthritis    wrist, hands -  . Atrial fibrillation, persistent ([place])   . Bladder tumor   . CAD (coronary artery disease) cardiologist-  dr hochrien   a. PTCA to RCA (02/ 1999);  b.  Myoview 5/10:  EF 71%, no ischemia  . Diabetes ([place])   . Full dentures   . GERD (gastroesophageal reflux disease)   . History of colon polyps    2005  hyperplastic  . History of MI (myocardial infarction)    1999  . HLD (hyperlipidemia)   . Hypertension   . Hypothyroidism   . SUI (stress urinary incontinence,  female)   . Wears glasses     Patient Active Problem List   Diagnosis Date Noted  . OSA (obstructive sleep apnea) 03/16/2018  . History of bladder cancer 08/24/2017  . Claudication ([place]) 07/21/2017  . Onychomycosis of toenail 06/09/2017  . Chronic right hip pain 03/14/2017  . Back pain 10/20/2015  . Near syncope 09/11/2015  . Atrial fibrillation with RVR ([place]) 09/11/2015  . Disease of middle ear or mastoid 04/10/2015  . Long-term (current) use of anticoagulants 02/19/2015  . Long QT interval 11/08/2014  . Bradycardia, sinus   . Family history of colon cancer - mother in 48's 06/04/2014  . Diabetes ([place]) 04/11/2014  . Atrial fibrillation ([place]) 12/18/2013  . Colon polyps 05/06/2011  . DEGENERATIVE JOINT DISEASE, LEFT HIP 07/20/2010  . HLD (hyperlipidemia) 04/29/2009  . Obesity 04/29/2009  . Essential hypertension, benign 04/29/2009  . Coronary atherosclerosis 04/29/2009  . Hypothyroidism 02/23/2007  . MYOCARDIAL INFARCTION, OLD 02/23/2007  . Personal history of colonic adenoma 11/11/2004    Past Surgical History:  Procedure Laterality Date  . CARDIOVASCULAR STRESS TEST  05-01-2009   dr hochrein   normal nuclear study/  no ischemia or scar/  normal LV function and wall motion, ef 71%  . CORONARY ANGIOPLASTY  Feb 1999   PTCA to RCA  . D & C HYSTEROSCOPY/  POLYPECTOMY  05-08-2009  . HYSTEROSCOPY W/D&C  12/07/2012   Procedure: DILATATION AND CURETTAGE /  HYSTEROSCOPY;  Surgeon: Elveria Royals, MD;  Location: [facility];  Service: Gynecology;  Laterality: N/A;  Dilitation and curettage /hysteroscopy/biopsey of lower segment of uterus  . TOTAL HIP ARTHROPLASTY Left 12-11-2010  . TRANSTHORACIC ECHOCARDIOGRAM  09-11-2015   mild LVH, ef 55%,  mildly reduced RVSF,  trivial TR  . TRANSURETHRAL RESECTION OF BLADDER TUMOR N/A 12/01/2015   Procedure: TRANSURETHRAL RESECTION OF BLADDER TUMOR (TURBT);  Surgeon: Kathie Rhodes, MD;  Location: [facility];  Service: Urology;   Laterality: N/A;  . TUBAL LIGATION  1974     OB History   None      Home Medications    Prior to Admission medications   Medication Sig Start Date End Date Taking? Authorizing Provider  calcium carbonate (TUMS - DOSED IN MG ELEMENTAL CALCIUM) 500 MG chewable tablet Chew 1 tablet by mouth as needed for indigestion or heartburn.    [provider]  carbamide peroxide (DEBROX) 6.5 % otic solution Place 5 drops into both ears 2 (two) times daily. Patient taking differently: Place 5 drops into both ears once a week.  12/26/15   Haney, Yetta Flock A, MD  CARTIA XT 300 MG 24 hr capsule TAKE 1 CAPSULE(300 MG) BY MOUTH DAILY 03/06/18   Minus Breeding, MD  cyclobenzaprine (FLEXERIL) 5 MG tablet Take 1 tablet (5 mg total) by mouth 3 (three) times daily as needed for muscle spasms. 12/08/17   Carlyle Dolly, MD  ELIQUIS 5 MG TABS tablet TAKE 1 TABLET(5 MG) BY MOUTH TWICE DAILY 02/20/18   Minus Breeding, MD  glucose blood (ONETOUCH VERIO) test strip Use as instructed 03/14/17   Sela Hilding, MD  Lancet Devices (ONE TOUCH DELICA LANCING DEV) MISC 1 Units by Does not apply route 2 (two) times daily at 8 am and 10 pm. 03/14/17   Sela Hilding, MD  levothyroxine (SYNTHROID, LEVOTHROID) 100 MCG tablet Take 1 tablet (100 mcg total) by mouth daily. 03/16/18   Sela Hilding, MD  loratadine (CLARITIN) 10 MG tablet Take 10 mg by mouth daily as needed for allergies.    [provider]  metFORMIN (GLUCOPHAGE-XR) 500 MG 24 hr tablet Take 2 tablets (1,000 mg total) by mouth 2 (two) times daily. 07/05/18   Sela Hilding, MD  nitroGLYCERIN (NITROSTAT) 0.4 MG SL tablet PLACE 1 TABLET UNDER TONGUE EVERY 5 MINUTES AS NEEDED FOR CHEST PAIN 09/15/15   Rosemarie Ax, MD  [facility] DELICA LANCETS FINE MISC TEST TWICE DAILY( 8AM AND 10PM) 10/03/17   Sela Hilding, MD  rosuvastatin (CRESTOR) 40 MG tablet TAKE 1 TABLET(40 MG) BY MOUTH DAILY 02/20/18   Minus Breeding, MD  diltiazem  (DILACOR XR) 120 MG 24 hr capsule Take 1 capsule (120 mg total) by mouth daily. 09/15/11 05/15/12  Minus Breeding, MD    Family History Family History  Problem Relation Age of Onset  . Diabetes Mother   . Heart disease Mother   . Colon cancer Mother 39  . Heart disease Father   . Cancer Sister        Lung  . Cancer Brother        intestinal  . Cancer Brother        throat ca  . Cancer Brother        bladder  . Breast cancer Neg Hx     Social History Social History   Tobacco Use  . Smoking status: Former Smoker    Packs/day: 1.50    Years: 15.00    Pack years:  22.50    Types: Cigarettes    Last attempt to quit: 11/09/1981    Years since quitting: 36.6  . Smokeless tobacco: Never Used  Substance Use Topics  . Alcohol use: No  . Drug use: No     Allergies   Penicillins and Percocet [oxycodone-acetaminophen]   Review of Systems Review of Systems  Constitutional: Positive for activity change.  Respiratory: Negative for shortness of breath.   Cardiovascular: Negative for chest pain.  Neurological: Positive for dizziness and light-headedness.  All other systems reviewed and are negative.    Physical Exam Updated Vital Signs BP 122/75   Pulse 80   Temp 98.4 F (36.9 C) (Oral)   Resp 19   Ht 5\' 3"  (1.6 m)   Wt 85.7 kg (189 lb)   LMP  (LMP Unknown)   SpO2 94%   BMI 33.48 kg/m   Physical Exam  Constitutional: She is oriented to person, place, and time. She appears well-developed.  HENT:  Head: Normocephalic and atraumatic.  Eyes: Pupils are equal, round, and reactive to light. EOM are normal.  Neck: Normal range of motion. Neck supple.  Cardiovascular: Normal rate and intact distal pulses.  Irregular rhythm  Pulmonary/Chest: Effort normal.  Abdominal: Bowel sounds are normal.  Musculoskeletal: She exhibits no edema or tenderness.  Neurological: She is alert and oriented to person, place, and time.  Skin: Skin is warm and dry.  Nursing note and  vitals reviewed.    ED Treatments / Results  Labs (all labs ordered are listed, but only abnormal results are displayed) Labs Reviewed  BASIC METABOLIC PANEL - Abnormal; Notable for the following components:      Result Value   Glucose, Bld 107 (*)    Creatinine, Ser 1.16 (*)    GFR calc non Af Amer 47 (*)    GFR calc Af Amer 54 (*)    All other components within normal limits  URINALYSIS, ROUTINE W REFLEX MICROSCOPIC - Abnormal; Notable for the following components:   APPearance HAZY (*)    Hgb urine dipstick SMALL (*)    Protein, ur 100 (*)    All other components within normal limits  CBG MONITORING, ED - Abnormal; Notable for the following components:   Glucose-Capillary 105 (*)    All other components within normal limits  CBC    EKG EKG Interpretation  Date/Time:  Monday July 10 2018 12:28:35 EDT Ventricular Rate:  73 PR Interval:    QRS Duration: 99 QT Interval:  475 QTC Calculation: 524 R Axis:   101 Text Interpretation:  Atrial fibrillation Right axis deviation Low voltage, precordial leads Nonspecific T abnrm, anterolateral leads Prolonged QT interval No significant change since last tracing Confirmed by Varney Biles [phone]) on 07/10/2018 2:10:16 PM   Radiology No results found.  Procedures Procedures (including critical care time)  Medications Ordered in ED Medications - No data to display   Initial Impression / Assessment and Plan / ED Course  I have reviewed the triage vital signs and the nursing notes.  Pertinent labs & imaging results that were available during my care of the patient were reviewed by me and considered in my medical decision making (see chart for details).     67 year old female comes in with chief complaint of near syncope.  DDx includes: Orthostatic hypotension Stroke / TIA Vertebral artery dissection/stenosis Dysrhythmia PE Vasovagal/neurocardiogenic syncope Valvular disorder/Cardiomyopathy Anemia  68 year old  female comes in with chief complaint of dizziness that led up to near syncope.  Patient does not have any specific prodrome prior to the event, and it does not appear to be a neurocardiogenic event based on history alone.  She has history of A. fib and prolonged QT -and therefore there is a possibility that she went into a tachydysrhythmia for a 5-minute episode that led her to be decompensated.  Other possibility includes TIA like event.  Patient is asymptomatic at this time, and her vitals, EKG and labs are within normal limits.  Given the suspicion for possible cardiac etiology for the near fainting, and lack of supporting evidence for benign cause of syncope such as neurocardiogenic event or medication side effect, we will admit patient to the hospital for observation.  Final Clinical Impressions(s) / ED Diagnoses   Final diagnoses:  Near syncope  Prolonged QT interval    ED Discharge Orders    None       Varney Biles, MD 07/10/18 [place], Alsie Younes, MD 07/10/18 1511

## 2018-07-10 NOTE — Discharge Summary (Addendum)
[facility] Discharge Summary  Patient name: [name] Medical record number: [id number] Date of birth: [birth year] Age: 67 y.o. Gender: female Date of Admission: 07/10/2018  Date of Discharge: 07/11/2018 Admitting Physician: Alveda Reasons, MD  Primary Care Provider: Sela Hilding, MD Consultants: Cardiology  Indication for Hospitalization: Near-syncopal Episode  Discharge Diagnoses/Problem List:  Atrial Fibrillation  Long GT Claudication Hypothyroidism  Type 2 Diabetes Active sleep apnea Paroxysmal nocturnal dyspnea HLD  Disposition: Discharged home  Discharge Condition: Stable  Discharge Exam:  General:  Well nourished, well developed, sitting upright in bedside chair in no acute distress HEENT: sclera anicteric  Neck: no JVD Vascular: No carotid bruits; distal pulses 2+ bilaterally Cardiac:  normal S1, S2; IRIR; no murmurs, gallops, or rubs Lungs:  clear to auscultation bilaterally, no wheezing, rhonchi or rales  Abd: NABS, soft, nontender, no hepatomegaly Ext: no edema Musculoskeletal:  No deformities, BUE and BLE strength normal and equal Skin: warm and dry  Neuro:  CNs 2-12 intact, no focal abnormalities noted Psych:  Normal affect   Brief Hospital Course:  [name] is a 67 year old female who presented to the emergency department after having a near syncopal episode.  Cardiology was consulted. EKG showed atrial fibrillation and QT prolongation with good rate control. Echo from 7/16 showed an ejection fraction of 55% to 60%.  Orthostatic vitals were negative, troponins trended were negative x3, and TSH was also within normal limits. The patient continued to improve clinically and remained without symptoms of syncope and atrial fibrillation.  She was discharged with instructions to follow-up with cardiology and her primary care physician.  Issues for Follow Up:  1. Plan for 30-day event monitor and follow-up with Dr.  Percival Spanish. 2. Continue taking medications as prescribed for chronic conditions, including Eliquis for stroke prophylaxis and diltiazem for rate control. 3. Patient endorses some anxiety from the added responsibility of watching grandkids.  Would prefer to follow-up with PCP regarding this stressor.  Significant Procedures: None  Significant Labs and Imaging:  CBC Latest Ref Rng & Units 07/11/2018 07/10/2018 03/15/2018  WBC 4.0 - 10.5 K/uL 10.8(H) 9.2 9.1  Hemoglobin 12.0 - 15.0 g/dL 12.8 12.0 12.9  Hematocrit 36.0 - 46.0 % 40.1 38.1 40.2  Platelets 150 - 400 K/uL 268 265 271   CMP Latest Ref Rng & Units 07/11/2018 07/10/2018 03/15/2018  Glucose 70 - 99 mg/dL 123(H) 107(H) 128(H)  BUN 8 - 23 mg/dL 9 11 19   Creatinine 0.44 - 1.00 mg/dL 1.00 1.16(H) 0.96  Sodium 135 - 145 mmol/L 143 140 141  Potassium 3.5 - 5.1 mmol/L 4.1 3.9 4.8  Chloride 98 - 111 mmol/L 106 106 102  CO2 22 - 32 mmol/L 25 25 22   Calcium 8.9 - 10.3 mg/dL 9.4 9.2 9.6  Total Protein 6.0 - 8.5 g/dL - - -  Total Bilirubin 0.0 - 1.2 mg/dL - - -  Alkaline Phos 39 - 117 IU/L - - -  AST 0 - 40 IU/L - - -  ALT 0 - 32 IU/L - - -   Echo (7/16): EF 55 - 60% UA (7/15): hazy with increased protein HIV (7/15): negative TSH (7/15): 0.747 N Troponin trend (7/15 - 7/16): negative Orthostatic Vitals (7/15): negative  Results/Tests Pending at Time of Discharge: None  Discharge Medications:  Allergies as of 07/11/2018      Reactions   Penicillins Itching   Has patient had a PCN reaction causing immediate rash, facial/tongue/throat swelling, SOB or lightheadedness with hypotension: Yes  Has patient had a PCN reaction causing severe rash involving mucus membranes or skin necrosis: No Has patient had a PCN reaction that required hospitalization: No Has patient had a PCN reaction occurring within the last 10 years: Yes If all of the above answers are "NO", then may proceed with Cephalosporin use.   Percocet [oxycodone-acetaminophen] Itching       Medication List    TAKE these medications   calcium carbonate 500 MG chewable tablet Commonly known as:  TUMS - dosed in mg elemental calcium Chew 1 tablet by mouth as needed for indigestion or heartburn.   CARTIA XT 300 MG 24 hr capsule Generic drug:  diltiazem TAKE 1 CAPSULE(300 MG) BY MOUTH DAILY   cyclobenzaprine 5 MG tablet Commonly known as:  FLEXERIL Take 1 tablet (5 mg total) by mouth 3 (three) times daily as needed for muscle spasms.   ELIQUIS 5 MG Tabs tablet Generic drug:  apixaban TAKE 1 TABLET(5 MG) BY MOUTH TWICE DAILY   glucose blood test strip Commonly known as:  ONETOUCH VERIO Use as instructed   levothyroxine 100 MCG tablet Commonly known as:  SYNTHROID, LEVOTHROID Take 1 tablet (100 mcg total) by mouth daily.   loratadine 10 MG tablet Commonly known as:  CLARITIN Take 10 mg by mouth daily as needed for allergies.   metFORMIN 500 MG 24 hr tablet Commonly known as:  GLUCOPHAGE-XR Take 2 tablets (1,000 mg total) by mouth 2 (two) times daily. What changed:  how much to take   nitroGLYCERIN 0.4 MG SL tablet Commonly known as:  NITROSTAT PLACE 1 TABLET UNDER TONGUE EVERY 5 MINUTES AS NEEDED FOR CHEST PAIN   ONE TOUCH DELICA LANCING DEV Misc 1 Units by Does not apply route 2 (two) times daily at 8 am and 10 pm.   ONETOUCH DELICA LANCETS FINE Misc TEST TWICE DAILY( 8AM AND 10PM)   rosuvastatin 40 MG tablet Commonly known as:  CRESTOR TAKE 1 TABLET(40 MG) BY MOUTH DAILY       Discharge Instructions: Please refer to Patient Instructions section of EMR for full details.  Patient was counseled important signs and symptoms that should prompt return to medical care, changes in medications, dietary instructions, activity restrictions, and follow up appointments.   Follow-Up Appointments: Follow-up Information    Wilber Oliphant, MD. Go on 07/18/2018.   Specialty:  Family Medicine Why:  10am Contact information: [address] [place]  [address] [phone]        Sela Hilding, MD. Go on 08/14/2018.   Specialty:  Family Medicine Why:  8:50am Contact information: [place] [address] [phone]        [facility] On 07/19/2018.   Specialty:  Cardiology Why:  Please arrive 15 minutes early for your 10:30am appointment to pick up your heart monitor Contact information: [place] [phone]       Almyra Deforest, [place] On 08/24/2018.   Specialties:  Cardiology, Radiology Why:  Please arrive 15 minutes early for your 2:30pm appointment Contact information: [place] [address] [place], [place], PGY-1 07/11/2018 6:39 PM

## 2018-07-10 NOTE — ED Triage Notes (Addendum)
Pt arrives EMS from [facility] where she had sudden onset of weakness and dizziness. Hx of A-Fib on elequis. Improves with supine position but not orthostatic per EMS. Pt denies fall

## 2018-07-11 ENCOUNTER — Other Ambulatory Visit: Payer: Self-pay | Admitting: Medical

## 2018-07-11 ENCOUNTER — Observation Stay (HOSPITAL_BASED_OUTPATIENT_CLINIC_OR_DEPARTMENT_OTHER): Payer: Medicare Other

## 2018-07-11 DIAGNOSIS — E039 Hypothyroidism, unspecified: Secondary | ICD-10-CM | POA: Diagnosis not present

## 2018-07-11 DIAGNOSIS — R9431 Abnormal electrocardiogram [ECG] [EKG]: Secondary | ICD-10-CM

## 2018-07-11 DIAGNOSIS — I252 Old myocardial infarction: Secondary | ICD-10-CM

## 2018-07-11 DIAGNOSIS — I482 Chronic atrial fibrillation: Secondary | ICD-10-CM

## 2018-07-11 DIAGNOSIS — R55 Syncope and collapse: Secondary | ICD-10-CM

## 2018-07-11 DIAGNOSIS — E118 Type 2 diabetes mellitus with unspecified complications: Secondary | ICD-10-CM

## 2018-07-11 DIAGNOSIS — I739 Peripheral vascular disease, unspecified: Secondary | ICD-10-CM | POA: Diagnosis not present

## 2018-07-11 DIAGNOSIS — I420 Dilated cardiomyopathy: Secondary | ICD-10-CM | POA: Diagnosis not present

## 2018-07-11 DIAGNOSIS — I471 Supraventricular tachycardia: Secondary | ICD-10-CM

## 2018-07-11 DIAGNOSIS — I110 Hypertensive heart disease with heart failure: Secondary | ICD-10-CM | POA: Diagnosis not present

## 2018-07-11 DIAGNOSIS — I5030 Unspecified diastolic (congestive) heart failure: Secondary | ICD-10-CM | POA: Diagnosis not present

## 2018-07-11 DIAGNOSIS — I071 Rheumatic tricuspid insufficiency: Secondary | ICD-10-CM | POA: Diagnosis not present

## 2018-07-11 DIAGNOSIS — I361 Nonrheumatic tricuspid (valve) insufficiency: Secondary | ICD-10-CM | POA: Diagnosis not present

## 2018-07-11 DIAGNOSIS — G4733 Obstructive sleep apnea (adult) (pediatric): Secondary | ICD-10-CM

## 2018-07-11 LAB — BASIC METABOLIC PANEL
Anion gap: 12 (ref 5–15)
BUN: 9 mg/dL (ref 8–23)
CO2: 25 mmol/L (ref 22–32)
Calcium: 9.4 mg/dL (ref 8.9–10.3)
Chloride: 106 mmol/L (ref 98–111)
Creatinine, Ser: 1 mg/dL (ref 0.44–1.00)
GFR calc Af Amer: >60 mL/min (ref >60)
GFR calc non Af Amer: 56 mL/min — ABNORMAL LOW (ref >60)
Glucose, Bld: 123 mg/dL — ABNORMAL HIGH (ref 70–99)
Potassium: 4.1 mmol/L (ref 3.5–5.1)
Sodium: 143 mmol/L (ref 135–145)

## 2018-07-11 LAB — ECHOCARDIOGRAM COMPLETE
Height: 63 in
Weight: 2835.2 oz

## 2018-07-11 LAB — TROPONIN I
Troponin I: <0.03 ng/mL (ref <0.03)
Troponin I: <0.03 ng/mL (ref <0.03)

## 2018-07-11 LAB — CBC
HCT: 40.1 % (ref 36.0–46.0)
Hemoglobin: 12.8 g/dL (ref 12.0–15.0)
MCH: 28.6 pg (ref 26.0–34.0)
MCHC: 31.9 g/dL (ref 30.0–36.0)
MCV: 89.7 fL (ref 78.0–100.0)
Platelets: 268 10*3/uL (ref 150–400)
RBC: 4.47 MIL/uL (ref 3.87–5.11)
RDW: 13.2 % (ref 11.5–15.5)
WBC: 10.8 10*3/uL — ABNORMAL HIGH (ref 4.0–10.5)

## 2018-07-11 LAB — GLUCOSE, CAPILLARY
Glucose-Capillary: 77 mg/dL (ref 70–99)
Glucose-Capillary: 102 mg/dL — ABNORMAL HIGH (ref 70–99)

## 2018-07-11 LAB — HIV ANTIBODY (ROUTINE TESTING W REFLEX): HIV Screen 4th Generation wRfx: NONREACTIVE

## 2018-07-11 NOTE — Progress Notes (Signed)
  Echocardiogram 2D Echocardiogram has been performed.  Jennette Dubin 07/11/2018, 1:35 PM

## 2018-07-11 NOTE — Discharge Instructions (Addendum)

## 2018-07-11 NOTE — Progress Notes (Signed)
Family Medicine Teaching Service Daily Progress Note Intern Pager: [phone]  Patient name: [name] Medical record number: [id number] Date of birth: 21-Jan-1951 Age: 67 y.o. Gender: female  Primary Care Provider: Sela Hilding, MD Consultants: none Code Status: full  Pt Overview and Major Events to Date:  7/15 - admitted for near syncope  Assessment and Plan: [name] is a 67 y.o. female presenting with near syncope . PMH is significant for A-Fib, long QT, claudication, hypothyroidism.  Near-syncope DDx: A-Fib w/ RVR, Dehydration, orthostatic hypotension, hypothyroid, hypoglycemia. Arhythmia is leading suspicion with hx of Afib and feeling of palpitations immediately after.  Dehydration possible given story but patient appeared clinically euvolemic on exam.  Considered orthostatic hypotension as well but no significant BP change on standing in ED without any fluids given.  Hypoglycemia very unlikely given eating of biscuit in AM and normal glucose on admission. Patient has had one episode of near syncope in the past, several years ago.  More recently patient has noted that she has been able to feel a flutter at times.  Patient reports one episode of feeling her a flutter in the past week.  Earlier today patient is felt her heart palpitations after near-syncopal episode. Only 1 glass of water, 1 cup coffe today, was hot today.  Patient's vitals were remarkable for A-fib respiratory rate 21.  Work-up in the ED was largely unremarkable.  EKG showed A. fib without RVR, BG WNL, Orthostatic vitals: increased HR (74-95) BP unchanged. Repeat EKG shows no changes from admission.TSH: 0.74, Troponin neg x 2. Multiple episodes of tachycardia overnight with HR in the 140s, all narrow complex tachycardia. -Telemetry w/ multiple episodes of tachycardia overnight - echo - Cardiology consult   A-fib without RVR Patient has a medical history significant for A. fib with RVR and is currently  medicated with diltiazem 300 mg daily and Eliquis. - Continue home medications - echo  Hypothyroidism Last TSH was 0.366 (03/15/18).  Patient takes Synthroid 100 mcg at home. -Continue home medications  Diabetes type 2 Last A1c was 6.1 (03/15/18).  Patient currently takes metformin 500 mg at home.  States she checks a CBG weekly usually 100-125 - Sensitive SSI, TID with meals  HLD - Stable, on home rosuvastatin 40 mg Daily - Continue home meds  Social stress/anxiety: Patient     FEN/GI: heart health /carb PPx: apixiban  Disposition: DC home today  Subjective:  Overnight telemetry showed multiple episodes of tachycardia.  Patient noted that she did feel one episode of palpitations overnight.  Patient denied new episodes of dizziness lightheadedness or falling.  No new complaints this morning.  Objective: Temp:  [98 F (36.7 C)-98.5 F (36.9 C)] 98.5 F (36.9 C) (07/16 0500) Pulse Rate:  [64-122] 71 (07/16 0500) Resp:  [11-24] 24 (07/16 0500) BP: (113-141)/(55-80) 124/80 (07/16 0500) SpO2:  [91 %-99 %] 91 % (07/16 0500) Weight:  [177 lb 3.2 oz (80.4 kg)-189 lb (85.7 kg)] 177 lb 3.2 oz (80.4 kg) (07/16 0500) Physical Exam: General: Alert and cooperative and appears to be in no acute distress HEENT: Neck non-tender without lymphadenopathy, masses or thyromegaly Cardio: Normal A1 and S2, no S3 or S4. Rhythm is irregularly irregular. No murmurs or rubs.   Pulm: Clear to auscultation bilaterally, no crackles, wheezing, or diminished breath sounds. Normal respiratory effort Abdomen: Bowel sounds normal. Abdomen soft and non-tender.  Extremities: No peripheral edema. Warm/ well perfused.  Strong radial pulses. Neuro: Cranial nerves grossly intact    Laboratory: Recent Labs  Lab 07/10/18 1248  WBC 9.2  HGB 12.0  HCT 38.1  PLT 265   Recent Labs  Lab 07/10/18 1248  NA 140  K 3.9  CL 106  CO2 25  BUN 11  CREATININE 1.16*  CALCIUM 9.2  GLUCOSE 107*      Imaging/Diagnostic Tests: No results found.   Matilde Haymaker, MD 07/11/2018, 5:42 AM PGY-1, [place] Intern pager: [phone], text pages welcome

## 2018-07-11 NOTE — Consult Note (Signed)
Cardiology Consultation:   Patient ID: MARCINA Adkins; [id number]; 05-11-1951   Admit date: 07/10/2018 Date of Consult: 07/11/2018  Primary Care Provider: Sela Hilding, MD Primary Cardiologist: Minus Breeding, MD Primary Electrophysiologist:  None   Patient Profile:   [name] is a 67 y.o. female with a PMH of permanent atrial fibrillation, CAD s/p MI with PTCA to RCA in 1999, HTN, HLD, prolonged QT, DM type 2, hypothyroidism, and OSA on CPAP, who is being seen today for the evaluation of atrial fibrillation at the request of Dr. Silvio Clayman.  History of Present Illness:   [name] presented 07/10/18 after having a near syncopal episode while running errands yesterday with her son. She reported walking into [employer] from a handicap parking spot when she became acutely lightheaded and had to support herself on the counter. She then felt worsening of her symptoms and was lowered to a chair by her son. She denies LOC and did not hit her head. She was able to sit down for 5 minutes, was given water and a cool rag on her head, and her symptoms improved. She reported feeling palpitations after her near syncope. She denied having CP, nausea, vomiting, or diaphoresis surrounding this event. She reported only having 1 cup of water and 1 cup of coffee during the day and attributes her symptoms to possibly being dehydrated.   She was last seen by Dr. Percival Spanish 09/2017 and was reported to be doing well. She is unaware of her atrial fibrillation and was without complaints of palpitations, presyncope, or syncope. She was continued on eliquis and diltiazem.   At the time of this evaluation, she reports feeling back to her baseline. She has had some nasal congestion and coughing for the past week which she attributes to allergies. She reports some chronic DOE which is unchanged. She reports recently being diagnosed with OSA and started using her CPAP 2 weeks ago which has improved her PND. She denies  recent exertional chest pain, SOB at rest, LE edema, or fevers.     Hospital course: Intermittently tachycardic, otherwise VSS. Orthostatic vitals were negative. Labs notable for electrolytes wnl, Cr 1.16, CBC wnl, TSH wnl, Trop negative x3. EKG with atrial fibrillation, rate 73, no STE/D, no TWI. Echo with EF 55-60%, G1DD, mildly reduced RV systolic function. She was given IVFs in the ED. Admitted to medicine with cardiology consulted for atrial fibrillation.   Past Medical History:  Diagnosis Date  . Anticoagulated on Coumadin   . Arthritis    wrist, hands -  . Atrial fibrillation, persistent ([place])   . Bladder tumor   . CAD (coronary artery disease) cardiologist-  dr hochrien   a. PTCA to RCA (02/ 1999);  b.  Myoview 5/10:  EF 71%, no ischemia  . Diabetes ([place])   . Full dentures   . GERD (gastroesophageal reflux disease)   . History of colon polyps    2005  hyperplastic  . History of MI (myocardial infarction)    1999  . HLD (hyperlipidemia)   . Hypertension   . Hypothyroidism   . SUI (stress urinary incontinence, female)   . Wears glasses     Past Surgical History:  Procedure Laterality Date  . CARDIOVASCULAR STRESS TEST  05-01-2009   dr hochrein   normal nuclear study/  no ischemia or scar/  normal LV function and wall motion, ef 71%  . CORONARY ANGIOPLASTY  Feb 1999   PTCA to RCA  . D & C HYSTEROSCOPY/  POLYPECTOMY  05-08-2009  . HYSTEROSCOPY W/D&C  12/07/2012   Procedure: DILATATION AND CURETTAGE /HYSTEROSCOPY;  Surgeon: Elveria Royals, MD;  Location: [facility];  Service: Gynecology;  Laterality: N/A;  Dilitation and curettage /hysteroscopy/biopsey of lower segment of uterus  . TOTAL HIP ARTHROPLASTY Left 12-11-2010  . TRANSTHORACIC ECHOCARDIOGRAM  09-11-2015   mild LVH, ef 55%,  mildly reduced RVSF,  trivial TR  . TRANSURETHRAL RESECTION OF BLADDER TUMOR N/A 12/01/2015   Procedure: TRANSURETHRAL RESECTION OF BLADDER TUMOR (TURBT);  Surgeon: Kathie Rhodes, MD;  Location:  [facility];  Service: Urology;  Laterality: N/A;  . TUBAL LIGATION  1974     Home Medications:  Prior to Admission medications   Medication Sig Start Date End Date Taking? Authorizing Provider  calcium carbonate (TUMS - DOSED IN MG ELEMENTAL CALCIUM) 500 MG chewable tablet Chew 1 tablet by mouth as needed for indigestion or heartburn.   Yes [provider]  CARTIA XT 300 MG 24 hr capsule TAKE 1 CAPSULE(300 MG) BY MOUTH DAILY 03/06/18  Yes Minus Breeding, MD  cyclobenzaprine (FLEXERIL) 5 MG tablet Take 1 tablet (5 mg total) by mouth 3 (three) times daily as needed for muscle spasms. 12/08/17  Yes Gambino, Arlie Solomons, MD  ELIQUIS 5 MG TABS tablet TAKE 1 TABLET(5 MG) BY MOUTH TWICE DAILY 02/20/18  Yes Minus Breeding, MD  levothyroxine (SYNTHROID, LEVOTHROID) 100 MCG tablet Take 1 tablet (100 mcg total) by mouth daily. 03/16/18  Yes Sela Hilding, MD  loratadine (CLARITIN) 10 MG tablet Take 10 mg by mouth daily as needed for allergies.   Yes [provider]  metFORMIN (GLUCOPHAGE-XR) 500 MG 24 hr tablet Take 2 tablets (1,000 mg total) by mouth 2 (two) times daily. Patient taking differently: Take 500 mg by mouth 2 (two) times daily.  07/05/18  Yes Sela Hilding, MD  nitroGLYCERIN (NITROSTAT) 0.4 MG SL tablet PLACE 1 TABLET UNDER TONGUE EVERY 5 MINUTES AS NEEDED FOR CHEST PAIN 09/15/15  Yes Rosemarie Ax, MD  rosuvastatin (CRESTOR) 40 MG tablet TAKE 1 TABLET(40 MG) BY MOUTH DAILY 02/20/18  Yes Minus Breeding, MD  glucose blood (ONETOUCH VERIO) test strip Use as instructed 03/14/17   Sela Hilding, MD  Lancet Devices (ONE TOUCH DELICA LANCING DEV) [place] 1 Units by Does not apply route 2 (two) times daily at 8 am and 10 pm. 03/14/17   Sela Hilding, MD  [facility] DELICA LANCETS FINE MISC TEST TWICE DAILY( 8AM AND 10PM) 10/03/17   Sela Hilding, MD  diltiazem (DILACOR XR) 120 MG 24 hr capsule Take 1 capsule (120 mg total) by mouth daily.  09/15/11 05/15/12  Minus Breeding, MD    Inpatient Medications: Scheduled Meds: . apixaban  5 mg Oral BID  . diltiazem  300 mg Oral Daily  . insulin aspart  0-9 Units Subcutaneous TID WC  . levothyroxine  100 mcg Oral QAC breakfast  . rosuvastatin  40 mg Oral Daily  . sodium chloride flush  3 mL Intravenous Q12H   Continuous Infusions:  PRN Meds: loratadine, nitroGLYCERIN  Allergies:    Allergies  Allergen Reactions  . Penicillins Itching    Has patient had a PCN reaction causing immediate rash, facial/tongue/throat swelling, SOB or lightheadedness with hypotension: Yes Has patient had a PCN reaction causing severe rash involving mucus membranes or skin necrosis: No Has patient had a PCN reaction that required hospitalization: No Has patient had a PCN reaction occurring within the last 10 years: Yes If all of the above answers  are "NO", then may proceed with Cephalosporin use.   Marland Kitchen Percocet [Oxycodone-Acetaminophen] Itching    Social History:   Social History   Socioeconomic History  . Marital status: Married    Spouse name: Eddie Dibbles  . Number of children: 5  . Years of education: 67  . Highest education level: Not on file  Occupational History  . Occupation: works at a Public house manager: [employer]  . Financial resource strain: Not on file  . Food insecurity:    Worry: Not on file    Inability: Not on file  . Transportation needs:    Medical: Not on file    Non-medical: Not on file  Tobacco Use  . Smoking status: Former Smoker    Packs/day: 1.50    Years: 15.00    Pack years: 22.50    Types: Cigarettes    Last attempt to quit: 11/09/1981    Years since quitting: 36.6  . Smokeless tobacco: Never Used  Substance and Sexual Activity  . Alcohol use: No  . Drug use: No  . Sexual activity: Not on file  Lifestyle  . Physical activity:    Days per week: Not on file    Minutes per session: Not on file  . Stress: Not on file  Relationships  .  Social connections:    Talks on phone: Not on file    Gets together: Not on file    Attends religious service: Not on file    Active member of club or organization: Not on file    Attends meetings of clubs or organizations: Not on file    Relationship status: Not on file  . Intimate partner violence:    Fear of current or ex partner: Not on file    Emotionally abused: Not on file    Physically abused: Not on file    Forced sexual activity: Not on file  Other Topics Concern  . 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  GED.  Has 5 adult children all living in the area.      Health Care POA:    Emergency Contact: husband, Yilia Sacca, [phone]   End of Life Plan: discussed AD, gave pt pamphlet   Who lives with you: husband, two sons, granddaughter, and great-grandson.    Any pets: dalamation- Kisses   Diet: Pt has a varied diet of protein, starch and vegetables.   Exercise: Pt has no regular exercise routine.  Does wear fitbit and tracks steps, reports walking 12-13k steps a day when she is working.   Seatbelts: Pt reports wearing seatbelt when in vehicles.    Hobbies: cooking       Family History:    Family History  Problem Relation Age of Onset  . Diabetes Mother   . Heart disease Mother   . Colon cancer Mother 9  . Heart disease Father   . Cancer Sister        Lung  . Cancer Brother        intestinal  . Cancer Brother        throat ca  . Cancer Brother        bladder  . Breast cancer Neg Hx      ROS:  Please see the history of present illness.   All other ROS reviewed and negative.     Physical Exam/Data:   Vitals:   07/11/18  0031 07/11/18 0500 07/11/18 0730 07/11/18 1101  BP: 118/65 124/80 116/61 118/72  Pulse: 73 71 60 81  Resp: 20 (!) 24 20 18   Temp: 98.5 F (36.9 C) 98.5 F (36.9 C) (!) 97.5 F (36.4 C) 97.8 F (36.6 C)  TempSrc: Oral Oral Oral Oral  SpO2: 95% 91% 95% 98%  Weight:  177 lb 3.2 oz (80.4  kg)    Height:        Intake/Output Summary (Last 24 hours) at 07/11/2018 1527 Last data filed at 07/11/2018 1430 Gross per 24 hour  Intake 1054 ml  Output 2250 ml  Net -1196 ml   Filed Weights   07/10/18 1228 07/10/18 1644 07/11/18 0500  Weight: 189 lb (85.7 kg) 179 lb 12.8 oz (81.6 kg) 177 lb 3.2 oz (80.4 kg)   Body mass index is 31.39 kg/m.  General:  Well nourished, well developed, sitting upright in bedside chair in no acute distress HEENT: sclera anicteric  Neck: no JVD Vascular: No carotid bruits; distal pulses 2+ bilaterally Cardiac:  normal S1, S2; IRIR; no murmurs, gallops, or rubs Lungs:  clear to auscultation bilaterally, no wheezing, rhonchi or rales  Abd: NABS, soft, nontender, no hepatomegaly Ext: no edema Musculoskeletal:  No deformities, BUE and BLE strength normal and equal Skin: warm and dry  Neuro:  CNs 2-12 intact, no focal abnormalities noted Psych:  Normal affect   EKG:  The EKG was personally reviewed and demonstrates:  Atrial fibrillation with rate 73, non-ischemic Telemetry:  Telemetry was personally reviewed and demonstrates:  Atrial fibrillation with rate well controlled with brief intermittent episodes of RVR.   Relevant CV Studies: Echocardiogram 07/11/18: Study Conclusions  - Left ventricle: The cavity size was normal. Wall thickness was   increased in a pattern of mild LVH. Systolic function was normal.   The estimated ejection fraction was in the range of 55% to 60%.   Wall motion was normal; there were no regional wall motion   abnormalities. Doppler parameters are consistent with abnormal   left ventricular relaxation (grade 1 diastolic dysfunction). - Mitral valve: Calcified annulus. There was trivial regurgitation. - Left atrium: The atrium was mildly dilated. - Right ventricle: Systolic function was mildly reduced. Systolic   pressure was increased. - Right atrium: The atrium was mildly dilated. - Tricuspid valve: There was mild  regurgitation. - Pulmonic valve: There was no significant regurgitation. - Pulmonary arteries: PA peak pressure: 33 mm Hg (S).  Impressions:  - Normal LV systolic function.  Laboratory Data:  Chemistry Recent Labs  Lab 07/10/18 1248 07/11/18 0521  NA 140 143  K 3.9 4.1  CL 106 106  CO2 25 25  GLUCOSE 107* 123*  BUN 11 9  CREATININE 1.16* 1.00  CALCIUM 9.2 9.4  GFRNONAA 47* 56*  GFRAA 54* >60  ANIONGAP 9 12    No results for input(s): PROT, ALBUMIN, AST, ALT, ALKPHOS, BILITOT in the last 168 hours. Hematology Recent Labs  Lab 07/10/18 1248 07/11/18 0521  WBC 9.2 10.8*  RBC 4.21 4.47  HGB 12.0 12.8  HCT 38.1 40.1  MCV 90.5 89.7  MCH 28.5 28.6  MCHC 31.5 31.9  RDW 13.3 13.2  PLT 265 268   Cardiac Enzymes Recent Labs  Lab 07/10/18 1710 07/10/18 2232 07/11/18 0521  TROPONINI <0.03 <0.03 <0.03   No results for input(s): TROPIPOC in the last 168 hours.  BNPNo results for input(s): BNP, PROBNP in the last 168 hours.  DDimer No results for input(s): DDIMER in the  last 168 hours.  Radiology/Studies:  No results found.  Assessment and Plan:   1. Presyncope: patient presented with sudden onset lightheadedness shortly after walking from her car into a [employer] yesterday afternoon. She denied LOCs and did not experience CP in the events surrounding the episode. Her symptoms improved with sitting down and drinking water. She reports not drinking as much as usual that morning. She also reported palpitations following her episode. Here troponins have been negative x3. EKG with atrial fibrillation but no ischemic changes. Monitored on telemetry with intermittent RVR. Orthostatic vitals negative. Possible this is 2/2 dehydration but cannot exclude arrhythmia given complaints of palpitations.  - Will plan for a 30-day event monitor and follow-up with Dr. Percival Spanish  2. Atrial fibrillation with intermittent RVR: - This patients CHA2DS2-VASc Score is 5 (HTN, DM, Vascular, Age  if 60-74, and Female) - Continue eliquis for stroke prophylaxis - Continue diltiazem for rate control  3. CAD s/p PTCA to RCA in 1999: no complaints of chest pain throughout yesterday's events. Troponin is negative x3. EKG without ischemic changes.  - Continue statin  4. HLD: LDL 64 03/2017; at goal of <70 - Continue statin   For questions or updates, please contact [place] Please consult [email or web address] for contact info under Cardiology/STEMI.   Signed, Abigail Butts, PA-C  07/11/2018 3:27 PM [phone]

## 2018-07-11 NOTE — Evaluation (Addendum)
Physical Therapy Evaluation & Discharge Patient Details Name: [name] MRN: [id number] DOB: Apr 30, 1951 Today's Date: 07/11/2018   History of Present Illness  Pt is a 67 y.o. female admitted 07/10/18 with c/o heart palpitations after near-syncopal episode. PMH includes a-fib, long QT, claudication.    Clinical Impression  Patient evaluated by Physical Therapy with no further acute PT needs identified. PTA, pt lives with family and indep with all mobility. Today, pt ambulating and performing ADLs independently; VSS and asymptomatic (see below). Educ on energy conservation, walking plan for cardiovascular endurance, and fall risk reduction. All education has been completed and the patient has no further questions. PT is signing off. Thank you for this referral.  Supine BP 130/72 Seated BP 139/78 Standing BP 136/96 Standing 3-min BP 141/89    Follow Up Recommendations No PT follow up    Equipment Recommendations  None recommended by PT    Recommendations for Other Services       Precautions / Restrictions Precautions Precautions: None Restrictions Weight Bearing Restrictions: No      Mobility  Bed Mobility Overal bed mobility: Independent                Transfers Overall transfer level: Independent                  Ambulation/Gait Ambulation/Gait assistance: Independent         Gait velocity interpretation: 1.31 - 2.62 ft/sec, indicative of limited community Conservation officer, historic buildings [place] (Stroke Patients Only)       Balance Overall balance assessment: No apparent balance deficits (not formally assessed)                                           Pertinent Vitals/Pain Pain Assessment: No/denies pain    Home Living Family/patient expects to be discharged to:: Private residence Living Arrangements: Spouse/significant other;Children Available Help at Discharge:  Family;Available 24 hours/day Type of Home: House Home Access: Level entry     Home Layout: One level Home Equipment: Walker - 2 wheels      Prior Function Level of Independence: Independent         Comments: Does not work. Drives. Indep with mobility; limited to short distances before requiring standing rest break (relies on resting on grocery cart at store)     Hand Dominance        Extremity/Trunk Assessment   Upper Extremity Assessment Upper Extremity Assessment: Overall WFL for tasks assessed    Lower Extremity Assessment Lower Extremity Assessment: Overall WFL for tasks assessed       Communication   Communication: No difficulties  Cognition Arousal/Alertness: Awake/alert Behavior During Therapy: WFL for tasks assessed/performed Overall Cognitive Status: Within Functional Limits for tasks assessed                                        General Comments      Exercises     Assessment/Plan    PT Assessment Patent does not need any further PT services  PT Problem List         PT Treatment Interventions      PT Goals (Current goals can be found in  the Care Plan section)  Acute Rehab PT Goals PT Goal Formulation: All assessment and education complete, DC therapy    Frequency     Barriers to discharge        Co-evaluation               AM-PAC PT "6 Clicks" Daily Activity  Outcome Measure Difficulty turning over in bed (including adjusting bedclothes, sheets and blankets)?: None Difficulty moving from lying on back to sitting on the side of the bed? : None Difficulty sitting down on and standing up from a chair with arms (e.g., wheelchair, bedside commode, etc,.)?: None Help needed moving to and from a bed to chair (including a wheelchair)?: None Help needed walking in hospital room?: None Help needed climbing 3-5 steps with a railing? : A Little 6 Click Score: 23    End of Session Equipment Utilized During Treatment:  Gait belt Activity Tolerance: Patient tolerated treatment well Patient left: in chair;with call bell/phone within reach Nurse Communication: Mobility status PT Visit Diagnosis: Other abnormalities of gait and mobility (R26.89)    Time: 7867-6720 PT Time Calculation (min) (ACUTE ONLY): 21 min   Charges:   PT Evaluation $PT Eval Moderate Complexity: 1 Mod     PT G Codes:       Mabeline Caras, PT, DPT Acute Rehab Services  Pager: [place] 07/11/2018, 9:35 AM

## 2018-07-11 NOTE — Progress Notes (Signed)
Patient went to sleep after meds were given.

## 2018-07-11 NOTE — Progress Notes (Signed)
Patient is alert and oriented sitting on the side of bed with PT/Ot check orthostatics. No dizziness or light-headed HR 110-120 with activity.

## 2018-07-18 ENCOUNTER — Encounter: Payer: Self-pay | Admitting: Family Medicine

## 2018-07-18 ENCOUNTER — Ambulatory Visit (INDEPENDENT_AMBULATORY_CARE_PROVIDER_SITE_OTHER): Payer: Medicare Other | Admitting: Family Medicine

## 2018-07-18 ENCOUNTER — Other Ambulatory Visit: Payer: Self-pay

## 2018-07-18 DIAGNOSIS — R55 Syncope and collapse: Secondary | ICD-10-CM

## 2018-07-18 NOTE — Progress Notes (Signed)
   Subjective:  PCP: Sela Hilding, MD Patient ID: MRN [id number]  Date of birth: 10/03/1951  HPI [name] is a 67 y.o. female with past medical history significant for recent admission for episode of near syncope who presents in clinic today for hospital follow-up.  Patient denies any repeated episodes of near syncope since admission.  She reports feeling fine today.  Per discharge note, team with wanted [place] to follow-up on patient's anxiety.  We spoke about that today, and it seems these have been social issues that the patient has been dealing with for some time.  She has children, grandchildren and great-grandchildren that live next door.  She reports increased responsibility because of poor parenting next door.  Today, she reports that she does not need any resources currently and she feels safe at home.  Review of Symptoms:  See HPI  HISTORY Medications, Allergies, Past Medical, Surgical, Social, and Family History Reviewed & Updated per EMR. Health Maintenance:  -Patient reports recent eye exam.  She reports that results should have been faxed over a few months ago.  We will send request. Health Maintenance Due  Topic Date Due  . OPHTHALMOLOGY EXAM  03/25/1959  . FOOT EXAM  06/09/2018    Social History:   reports that she quit smoking about 36 years ago. Her smoking use included cigarettes. She has a 22.50 pack-year smoking history. She has never used smokeless tobacco. She reports that she does not drink alcohol or use drugs.    Objective:  BP 118/78   Pulse 74   Temp 98.5 F (36.9 C) (Oral)   Ht 5\' 3"  (1.6 m)   Wt 182 lb (82.6 kg)   LMP  (LMP Unknown)   SpO2 96%   BMI 32.24 kg/m   Physical Exam:  Gen: NAD, alert, non-toxic, well-nourished, well-appearing, sitting comfortably in chair  HEENT: Normocephaic, atraumatic. Clear conjuctiva, no scleral icterus and injection.  Hearing intact. CV: Irregular rhythm. Normal capillary refill bilaterally.  Radial pulses  2+ bilaterally. Mild bilateral lower extremity edema. Resp: Clear to auscultation bilaterally.  No wheezing, rales, abnormal lung sounds.  No increased work of breathing appreciated. Abd: Nontender and nondistended on palpation to all 4 quadrants.  Positive bowel sounds. Psych: Cooperative with exam. Pleasant. Makes eye contact.  Pertinent Labs & Imaging:  Labs have been reviewed.  Assessment & Plan:   Near syncope Patient presents today for hospital follow-up for admission for near syncope.  She has had no episodes of near syncope since discharge.  She reports compliance with all of her medications.  She has no complaints today.  All of her follow-up appointments have been scheduled, including an appointment for Holter monitoring tomorrow.  Zettie Cooley, M.D. PGY-1, [facility]  07/18/2018, 10:18 AM

## 2018-07-18 NOTE — Assessment & Plan Note (Addendum)
Patient presents today for hospital follow-up for admission for near syncope.  She has had no episodes of near syncope since discharge.  She reports compliance with all of her medications.  She has no complaints today.  All of her follow-up appointments have been scheduled, including an appointment for Holter monitoring tomorrow.

## 2018-07-18 NOTE — Patient Instructions (Signed)
Dear [name],   It was nice to meet you today! I am glad you came in for your concerns. This document serves as a "wrap-up" to all that we discussed today and is listed as follows:    I am glad you have been having no symptoms since your admission.  We will continue to follow you as you go to your follow-up appointments.  Please continue to take your medications as prescribed.   As we spoke about today, if you continue to have any added stressors in your life and feel that you need any help please feel free to give [place] a call.  Thank you for choosing [facility] for your primary care needs and stay well!   Best,   Dr. Zettie Cooley Resident Physician [facility] [place] [phone]   Don't forget to sign up for MyChart for instant access to your health profile, labs, orders, upcoming appointments or to contact your provider with questions. Stop at the front desk on the way out for more information about how to sign up!

## 2018-07-19 ENCOUNTER — Other Ambulatory Visit: Payer: Self-pay | Admitting: Medical

## 2018-07-19 ENCOUNTER — Ambulatory Visit (INDEPENDENT_AMBULATORY_CARE_PROVIDER_SITE_OTHER): Payer: Medicare Other

## 2018-07-19 DIAGNOSIS — R9431 Abnormal electrocardiogram [ECG] [EKG]: Secondary | ICD-10-CM

## 2018-07-19 DIAGNOSIS — R55 Syncope and collapse: Secondary | ICD-10-CM

## 2018-07-19 DIAGNOSIS — I4891 Unspecified atrial fibrillation: Secondary | ICD-10-CM

## 2018-07-20 ENCOUNTER — Telehealth: Payer: Self-pay | Admitting: *Deleted

## 2018-07-20 NOTE — Telephone Encounter (Signed)
Received notification from [place]  07/19/18 at 10:56 AM Atrial fibrillation HR 80, no symptoms, patient sitting.   Per chart review, patient hx of permanent afib on coumadin.   Monitor placed 7/24, this is baseline.   Strips placed in Dr. Percival Spanish boxed for review.

## 2018-07-21 DIAGNOSIS — G4733 Obstructive sleep apnea (adult) (pediatric): Secondary | ICD-10-CM | POA: Diagnosis not present

## 2018-07-26 DIAGNOSIS — G4733 Obstructive sleep apnea (adult) (pediatric): Secondary | ICD-10-CM | POA: Diagnosis not present

## 2018-08-01 ENCOUNTER — Ambulatory Visit: Payer: Medicare Other | Admitting: Physician Assistant

## 2018-08-01 DIAGNOSIS — Z8551 Personal history of malignant neoplasm of bladder: Secondary | ICD-10-CM | POA: Diagnosis not present

## 2018-08-14 ENCOUNTER — Other Ambulatory Visit: Payer: Self-pay

## 2018-08-14 ENCOUNTER — Encounter: Payer: Self-pay | Admitting: Family Medicine

## 2018-08-14 ENCOUNTER — Ambulatory Visit (INDEPENDENT_AMBULATORY_CARE_PROVIDER_SITE_OTHER): Payer: Medicare Other | Admitting: Family Medicine

## 2018-08-14 VITALS — BP 112/68 | HR 69 | Temp 98.3°F | Ht 63.0 in | Wt 183.2 lb

## 2018-08-14 DIAGNOSIS — E118 Type 2 diabetes mellitus with unspecified complications: Secondary | ICD-10-CM

## 2018-08-14 DIAGNOSIS — G4733 Obstructive sleep apnea (adult) (pediatric): Secondary | ICD-10-CM | POA: Diagnosis not present

## 2018-08-14 DIAGNOSIS — R55 Syncope and collapse: Secondary | ICD-10-CM

## 2018-08-14 LAB — POCT GLYCOSYLATED HEMOGLOBIN (HGB A1C): HbA1c, POC (controlled diabetic range): 5.8 % (ref 0.0–7.0)
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
## 2018-08-14 NOTE — Assessment & Plan Note (Signed)
No additional episodes, seeing cards and wearing holter monitor today. Has stress test 8/29.

## 2018-08-14 NOTE — Patient Instructions (Addendum)
It was a pleasure to see you today! Thank you for choosing [facility] for your primary care. [name] was seen for stress, CPAP, DM  Our plans for today were:  If you want to have [facility] send me records, the fax is [phone]  For your diabetes, keep doing a great job and keep taking your metformin.   We will request eye records.   I will send a copy of this note to [facility] for CPAP.   You should return to our clinic to see Dr. Lindell Noe in 6 months for .   Best,  Dr. Lindell Noe

## 2018-08-14 NOTE — Assessment & Plan Note (Signed)
Great compliance and excellent reduction of AHI from PSG with AHI 62.2 prior to inititating CPAP. Patient feels well, continue CPAP. Messaged [facility].

## 2018-08-14 NOTE — Assessment & Plan Note (Addendum)
Endorses compliance with metformin, reviewed recent labs while hospitalized. Will get a1c today and continue metformin. F/u 6 months. Have also completed ROI for eye exam.

## 2018-08-17 DIAGNOSIS — L821 Other seborrheic keratosis: Secondary | ICD-10-CM | POA: Diagnosis not present

## 2018-08-17 DIAGNOSIS — Z85828 Personal history of other malignant neoplasm of skin: Secondary | ICD-10-CM | POA: Diagnosis not present

## 2018-08-17 DIAGNOSIS — L218 Other seborrheic dermatitis: Secondary | ICD-10-CM | POA: Diagnosis not present

## 2018-08-17 DIAGNOSIS — D0439 Carcinoma in situ of skin of other parts of face: Secondary | ICD-10-CM | POA: Diagnosis not present

## 2018-08-21 DIAGNOSIS — G4733 Obstructive sleep apnea (adult) (pediatric): Secondary | ICD-10-CM | POA: Diagnosis not present

## 2018-08-23 DIAGNOSIS — D0439 Carcinoma in situ of skin of other parts of face: Secondary | ICD-10-CM | POA: Diagnosis not present

## 2018-08-24 ENCOUNTER — Encounter: Payer: Self-pay | Admitting: Physician Assistant

## 2018-08-24 ENCOUNTER — Ambulatory Visit (INDEPENDENT_AMBULATORY_CARE_PROVIDER_SITE_OTHER): Payer: Medicare Other | Admitting: Physician Assistant

## 2018-08-24 ENCOUNTER — Encounter: Payer: Self-pay | Admitting: Cardiology

## 2018-08-24 VITALS — BP 116/74 | HR 77 | Ht 63.0 in | Wt 181.8 lb

## 2018-08-24 DIAGNOSIS — R072 Precordial pain: Secondary | ICD-10-CM | POA: Diagnosis not present

## 2018-08-24 DIAGNOSIS — I4819 Other persistent atrial fibrillation: Secondary | ICD-10-CM

## 2018-08-24 DIAGNOSIS — I10 Essential (primary) hypertension: Secondary | ICD-10-CM

## 2018-08-24 DIAGNOSIS — I481 Persistent atrial fibrillation: Secondary | ICD-10-CM | POA: Diagnosis not present

## 2018-08-24 DIAGNOSIS — E039 Hypothyroidism, unspecified: Secondary | ICD-10-CM

## 2018-08-24 DIAGNOSIS — Z79899 Other long term (current) drug therapy: Secondary | ICD-10-CM | POA: Diagnosis not present

## 2018-08-24 DIAGNOSIS — E785 Hyperlipidemia, unspecified: Secondary | ICD-10-CM

## 2018-08-24 DIAGNOSIS — I2510 Atherosclerotic heart disease of native coronary artery without angina pectoris: Secondary | ICD-10-CM

## 2018-08-24 DIAGNOSIS — E119 Type 2 diabetes mellitus without complications: Secondary | ICD-10-CM

## 2018-08-24 DIAGNOSIS — R55 Syncope and collapse: Secondary | ICD-10-CM

## 2018-08-24 NOTE — Patient Instructions (Signed)
Medication Instructions: Almyra Deforest, PA recommends that you continue on your current medications as directed. Please refer to the Current Medication list given to you today.  Labwork: Your physician recommends that you return for lab work at your convenience - FASTING.  Testing/Procedures: 1. [place] Stress test - Your physician has requested that you have a lexiscan myoview. For further information please visit [email or web address]. Please follow instruction sheet, as given.  Follow-up: Isaac Laud recommends that you schedule a follow-up appointment in 3 months with Dr Percival Spanish.  If you need a refill on your cardiac medications before your next appointment, please call your pharmacy.

## 2018-08-24 NOTE — Progress Notes (Signed)
Cardiology Office Note    Date:  08/26/2018   ID:  [name], DOB Dec 15, 1951, MRN [id number]  PCP:  Sela Hilding, MD  Cardiologist:  Dr. Percival Spanish  Chief Complaint  Patient presents with  . Hospitalization Follow-up    denies chest pains, mentions SOB, mentions swelling in feet.    History of Present Illness:  [name] is a 67 y.o. female with PMH of present atrial fibrillation, history of bradycardia, CAD, DM 2, hypertension, hyperlipidemia and hypothyroidism.  Patient underwent PTCA to RCA in February 1999.  Myoview in May 2010 showed EF 71%, no ischemia.  Patient had a 24-hour Holter monitor placed on February 20, 2016 that showed persistent atrial fibrillation with good rate control.  Echocardiogram obtained on 09/12/2015 showed EF 55%, mildly reduced RV function, PA peak pressure 32 mmHg.  Patient was last seen by Dr. Percival Spanish on 10/10/2017 at which time she was doing well.    Patient was most recently admitted to the hospital in July 2019 for near syncopal episode.  Orthostatic vital sign was normal.  In the hospital, patient was intermittently tachycardic.  Troponin was negative x3.  EKG demonstrated atrial fibrillation with controlled heart rate.  Echocardiogram obtained on 07/11/2018 showed EF 55 to 60%, grade 1 DD, mild LVH, mild LAE, PA peak pressure 33 mmHg.  It was felt she may be dehydrated and was given IVF in the emergency room.  Cardiology was consulted for atrial fibrillation, she was seen by Dr. Jenkins Rouge.  Given the resolution of her symptom, it was recommended for her to follow-up as outpatient with Myoview and 30-day event monitor.  Since discharge, she has had a 30-day event monitor in July that came back showing atrial fibrillation without significant pauses. Patient presents today for cardiology office visit.  She has not had any recurrent syncope since the last visit.  She occasionally has some chest tightness, however does not have clear correlation  with exertion.  I recommended to proceed with a Lexiscan Myoview.  Otherwise she has no lower extremity edema, orthopnea or PND.   Past Medical History:  Diagnosis Date  . Anticoagulated on Coumadin   . Arthritis    wrist, hands -  . Atrial fibrillation, persistent ([place])   . Bladder tumor   . Bradycardia, sinus   . CAD (coronary artery disease) cardiologist-  dr hochrien   a. PTCA to RCA (02/ 1999);  b.  Myoview 5/10:  EF 71%, no ischemia  . Diabetes ([place])   . Full dentures   . GERD (gastroesophageal reflux disease)   . History of colon polyps    2005  hyperplastic  . History of MI (myocardial infarction)    1999  . HLD (hyperlipidemia)   . Hypertension   . Hypothyroidism   . SUI (stress urinary incontinence, female)   . Wears glasses     Past Surgical History:  Procedure Laterality Date  . CARDIOVASCULAR STRESS TEST  05-01-2009   dr hochrein   normal nuclear study/  no ischemia or scar/  normal LV function and wall motion, ef 71%  . CORONARY ANGIOPLASTY  Feb 1999   PTCA to RCA  . D & C HYSTEROSCOPY/  POLYPECTOMY  05-08-2009  . HYSTEROSCOPY W/D&C  12/07/2012   Procedure: DILATATION AND CURETTAGE /HYSTEROSCOPY;  Surgeon: Elveria Royals, MD;  Location: [facility];  Service: Gynecology;  Laterality: N/A;  Dilitation and curettage /hysteroscopy/biopsey of lower segment of uterus  . TOTAL HIP ARTHROPLASTY Left 12-11-2010  .  TRANSTHORACIC ECHOCARDIOGRAM  09-11-2015   mild LVH, ef 55%,  mildly reduced RVSF,  trivial TR  . TRANSURETHRAL RESECTION OF BLADDER TUMOR N/A 12/01/2015   Procedure: TRANSURETHRAL RESECTION OF BLADDER TUMOR (TURBT);  Surgeon: Kathie Rhodes, MD;  Location: [facility];  Service: Urology;  Laterality: N/A;  . TUBAL LIGATION  1974    Current Medications: Outpatient Medications Prior to Visit  Medication Sig Dispense Refill  . calcium carbonate (TUMS - DOSED IN MG ELEMENTAL CALCIUM) 500 MG chewable tablet Chew 1 tablet by mouth as needed for  indigestion or heartburn.    Marland Kitchen CARTIA XT 300 MG 24 hr capsule TAKE 1 CAPSULE(300 MG) BY MOUTH DAILY 90 capsule 2  . ELIQUIS 5 MG TABS tablet TAKE 1 TABLET(5 MG) BY MOUTH TWICE DAILY 60 tablet 6  . glucose blood (ONETOUCH VERIO) test strip Use as instructed 100 each 12  . Lancet Devices (ONE TOUCH DELICA LANCING DEV) MISC 1 Units by Does not apply route 2 (two) times daily at 8 am and 10 pm. 1 each 1  . levothyroxine (SYNTHROID, LEVOTHROID) 100 MCG tablet Take 1 tablet (100 mcg total) by mouth daily. 90 tablet 3  . loratadine (CLARITIN) 10 MG tablet Take 10 mg by mouth daily as needed for allergies.    . metFORMIN (GLUCOPHAGE-XR) 500 MG 24 hr tablet Take 2 tablets (1,000 mg total) by mouth 2 (two) times daily. (Patient taking differently: Take 500 mg by mouth 2 (two) times daily. ) 120 tablet 3  . ONETOUCH DELICA LANCETS FINE MISC TEST TWICE DAILY( 8AM AND 10PM) 100 each 0  . rosuvastatin (CRESTOR) 40 MG tablet TAKE 1 TABLET(40 MG) BY MOUTH DAILY 30 tablet 6   No facility-administered medications prior to visit.      Allergies:   Penicillins and Percocet [oxycodone-acetaminophen]   Social History   Socioeconomic History  . Marital status: Married    Spouse name: Eddie Dibbles  . Number of children: 5  . Years of education: 52  . Highest education level: Not on file  Occupational History  . Occupation: works at a Public house manager: [employer]  . Financial resource strain: Not on file  . Food insecurity:    Worry: Not on file    Inability: Not on file  . Transportation needs:    Medical: Not on file    Non-medical: Not on file  Tobacco Use  . Smoking status: Former Smoker    Packs/day: 1.50    Years: 15.00    Pack years: 22.50    Types: Cigarettes    Last attempt to quit: 11/09/1981    Years since quitting: 36.8  . Smokeless tobacco: Never Used  Substance and Sexual Activity  . Alcohol use: No  . Drug use: No  . Sexual activity: Not on file  Lifestyle  .  Physical activity:    Days per week: Not on file    Minutes per session: Not on file  . Stress: Not on file  Relationships  . Social connections:    Talks on phone: Not on file    Gets together: Not on file    Attends religious service: Not on file    Active member of club or organization: Not on file    Attends meetings of clubs or organizations: Not on file    Relationship status: Not on file  Other Topics Concern  . 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  Has 5 adult children all living in the area.      Health Care POA:    Emergency Contact: husband, Shaguana Love, [phone]   End of Life Plan: discussed AD, gave pt pamphlet   Who lives with you: husband, two sons, granddaughter, and great-grandson.    Any pets: dalamation- Kisses   Diet: Pt has a varied diet of protein, starch and vegetables.   Exercise: Pt has no regular exercise routine.  Does wear fitbit and tracks steps, reports walking 12-13k steps a day when she is working.   Seatbelts: Pt reports wearing seatbelt when in vehicles.    Hobbies: cooking        Family History:  The patient's family history includes Cancer in her brother, brother, brother, and sister; Colon cancer (age of onset: 42) in her mother; Diabetes in her mother; Heart disease in her father and mother.   ROS:   Please see the history of present illness.    ROS All other systems reviewed and are negative.   PHYSICAL EXAM:   VS:  BP 116/74 (BP Location: Left Arm, Patient Position: Sitting)   Pulse 77   Ht 5\' 3"  (1.6 m)   Wt 181 lb 12.8 oz (82.5 kg)   LMP  (LMP Unknown)   SpO2 99%   BMI 32.20 kg/m    GEN: Well nourished, well developed, in no acute distress  HEENT: normal  Neck: no JVD, carotid bruits, or masses Cardiac: RRR; no murmurs, rubs, or gallops,no edema  Respiratory:  clear to auscultation bilaterally, normal work of breathing GI: soft, nontender, nondistended, + BS MS: no  deformity or atrophy  Skin: warm and dry, no rash Neuro:  Alert and Oriented x 3, Strength and sensation are intact Psych: euthymic mood, full affect  Wt Readings from Last 3 Encounters:  08/24/18 181 lb 12.8 oz (82.5 kg)  08/14/18 183 lb 3.2 oz (83.1 kg)  07/18/18 182 lb (82.6 kg)      Studies/Labs Reviewed:   EKG:  EKG is not ordered today.    Recent Labs: 07/10/2018: TSH 0.747 07/11/2018: BUN 9; Creatinine, Ser 1.00; Hemoglobin 12.8; Platelets 268; Potassium 4.1; Sodium 143   Lipid Panel    Component Value Date/Time   CHOL 150 04/25/2017 0843   TRIG 139 04/25/2017 0843   HDL 58 04/25/2017 0843   CHOLHDL 2.6 04/25/2017 0843   VLDL 28 04/25/2017 0843   LDLCALC 64 04/25/2017 0843   LDLDIRECT 125 (H) 06/01/2010 2029    Additional studies/ records that were reviewed today include:   Echo 09/12/2015 LV EF: 55% Study Conclusions  - Left ventricle: The cavity size was normal. Wall thickness was   increased in a pattern of mild LVH. The estimated ejection   fraction was 55%. Wall motion was normal; there were no regional   wall motion abnormalities. - Right ventricle: The cavity size was normal. Systolic function   was mildly reduced. - Pulmonary arteries: PA peak pressure: 32 mm Hg (S).   Echo 07/11/2018 LV EF: 55% -   60% Study Conclusions  - Left ventricle: The cavity size was normal. Wall thickness was   increased in a pattern of mild LVH. Systolic function was normal.   The estimated ejection fraction was in the range of 55% to 60%.   Wall motion was normal; there were no regional wall motion   abnormalities. Doppler parameters are consistent with abnormal   left ventricular  relaxation (grade 1 diastolic dysfunction). - Mitral valve: Calcified annulus. There was trivial regurgitation. - Left atrium: The atrium was mildly dilated. - Right ventricle: Systolic function was mildly reduced. Systolic   pressure was increased. - Right atrium: The atrium was mildly  dilated. - Tricuspid valve: There was mild regurgitation. - Pulmonic valve: There was no significant regurgitation. - Pulmonary arteries: PA peak pressure: 33 mm Hg (S).  Impressions:  - Normal LV systolic function.  ASSESSMENT:    1. Precordial chest pain   2. Dyslipidemia   3. Medication management   4. Persistent atrial fibrillation ([place])   5. Coronary artery disease involving native coronary artery of native heart without angina pectoris   6. Controlled type 2 diabetes mellitus without complication, without long-term current use of insulin ([place])   7. Essential hypertension   8. Hypothyroidism, unspecified type   9. Syncope, unspecified syncope type      PLAN:  In order of problems listed above:  1. Chest discomfort: She has been having intermittent chest tightness for the past several weeks.  I recommended Lexiscan Myoview  2. Syncope: No recurrence recently, felt to be related to dehydration.  30-day event monitor has been normal.  3. CAD: Not on aspirin given the need for Eliquis.  On statin  4. Hyperlipidemia: Continue Crestor 40 mg daily  5. Hypertension: Blood pressure well controlled  6. DM2: On metformin, managed by primary care provider  7. Hypothyroidism: Continue Synthroid    Medication Adjustments/Labs and Tests Ordered: Current medicines are reviewed at length with the patient today.  Concerns regarding medicines are outlined above.  Medication changes, Labs and Tests ordered today are listed in the Patient Instructions below. Patient Instructions  Medication Instructions: Almyra Deforest, PA recommends that you continue on your current medications as directed. Please refer to the Current Medication list given to you today.  Labwork: Your physician recommends that you return for lab work at your convenience - FASTING.  Testing/Procedures: 1. [place] Stress test - Your physician has requested that you have a lexiscan myoview. For further  information please visit [email or web address]. Please follow instruction sheet, as given.  Follow-up: Isaac Laud recommends that you schedule a follow-up appointment in 3 months with Dr Percival Spanish.  If you need a refill on your cardiac medications before your next appointment, please call your pharmacy.    Hilbert Corrigan, [place]  08/26/2018 11:34 PM    [facility] [place], [place], [place]  [address] Phone: [phone]; Fax: [phone]

## 2018-08-25 ENCOUNTER — Encounter: Payer: Self-pay | Admitting: Family Medicine

## 2018-08-26 ENCOUNTER — Encounter: Payer: Self-pay | Admitting: Physician Assistant

## 2018-08-31 ENCOUNTER — Telehealth (HOSPITAL_COMMUNITY): Payer: Self-pay

## 2018-08-31 NOTE — Telephone Encounter (Signed)
Encounter complete. 

## 2018-09-03 ENCOUNTER — Other Ambulatory Visit: Payer: Self-pay | Admitting: Cardiology

## 2018-09-05 ENCOUNTER — Ambulatory Visit (HOSPITAL_COMMUNITY)
Admission: RE | Admit: 2018-09-05 | Discharge: 2018-09-05 | Disposition: A | Discharge status: Home / Self Care | Payer: Medicare Other | Source: Ambulatory Visit | Attending: Cardiovascular Disease | Admitting: Cardiovascular Disease

## 2018-09-05 DIAGNOSIS — R072 Precordial pain: Secondary | ICD-10-CM | POA: Diagnosis not present

## 2018-09-05 LAB — MYOCARDIAL PERFUSION IMAGING
Peak HR: 141 {beats}/min
Rest HR: 77 {beats}/min
SDS: 1
SRS: 1
SSS: 2
TID: 1.25

## 2018-09-05 MED ORDER — REGADENOSON 0.4 MG/5ML IV SOLN
0.4000 mg | Freq: Once | INTRAVENOUS | Status: AC
  Administered 2018-09-05: 0.4 mg via INTRAVENOUS

## 2018-09-05 MED ORDER — TECHNETIUM TC 99M TETROFOSMIN IV KIT
8.4000 | PACK | Freq: Once | INTRAVENOUS | Status: AC | PRN
  Administered 2018-09-05: 8.4 via INTRAVENOUS
  Filled 2018-09-05: qty 9

## 2018-09-05 MED ORDER — TECHNETIUM TC 99M TETROFOSMIN IV KIT
25.3000 | PACK | Freq: Once | INTRAVENOUS | Status: AC | PRN
  Administered 2018-09-05: 25.3 via INTRAVENOUS
  Filled 2018-09-05: qty 26

## 2018-09-19 DIAGNOSIS — E785 Hyperlipidemia, unspecified: Secondary | ICD-10-CM | POA: Diagnosis not present

## 2018-09-19 DIAGNOSIS — Z79899 Other long term (current) drug therapy: Secondary | ICD-10-CM | POA: Diagnosis not present

## 2018-09-19 LAB — HEPATIC FUNCTION PANEL
ALT: 14 IU/L (ref 0–32)
AST: 17 IU/L (ref 0–40)
Albumin: 4 g/dL (ref 3.6–4.8)
Alkaline Phosphatase: 77 IU/L (ref 39–117)
Bilirubin Total: 0.3 mg/dL (ref 0.0–1.2)
Bilirubin, Direct: 0.11 mg/dL (ref 0.00–0.40)
Total Protein: 7.1 g/dL (ref 6.0–8.5)

## 2018-09-19 LAB — LIPID PANEL
Chol/HDL Ratio: 2.3 ratio (ref 0.0–4.4)
Cholesterol, Total: 139 mg/dL (ref 100–199)
HDL: 60 mg/dL (ref >39)
LDL Calculated: 56 mg/dL (ref 0–99)
Triglycerides: 115 mg/dL (ref 0–149)
VLDL Cholesterol Cal: 23 mg/dL (ref 5–40)

## 2018-09-21 ENCOUNTER — Other Ambulatory Visit: Payer: Self-pay | Admitting: Family Medicine

## 2018-09-21 DIAGNOSIS — G4733 Obstructive sleep apnea (adult) (pediatric): Secondary | ICD-10-CM | POA: Diagnosis not present

## 2018-09-21 DIAGNOSIS — Z1231 Encounter for screening mammogram for malignant neoplasm of breast: Secondary | ICD-10-CM

## 2018-09-29 ENCOUNTER — Other Ambulatory Visit: Payer: Self-pay

## 2018-10-02 MED ORDER — METFORMIN HCL ER 500 MG PO TB24: 1000.0000 mg | ORAL_TABLET | Freq: Two times a day (BID) | ORAL | 3 refills | Status: DC

## 2018-10-21 DIAGNOSIS — G4733 Obstructive sleep apnea (adult) (pediatric): Secondary | ICD-10-CM | POA: Diagnosis not present

## 2018-10-22 DIAGNOSIS — G4733 Obstructive sleep apnea (adult) (pediatric): Secondary | ICD-10-CM | POA: Diagnosis not present

## 2018-10-25 ENCOUNTER — Ambulatory Visit: Payer: Medicare Other

## 2018-10-26 ENCOUNTER — Ambulatory Visit (INDEPENDENT_AMBULATORY_CARE_PROVIDER_SITE_OTHER): Payer: Medicare Other

## 2018-10-26 VITALS — BP 112/72 | HR 65 | Temp 98.6°F | Ht 63.0 in | Wt 182.0 lb

## 2018-10-26 DIAGNOSIS — Z0000 Encounter for general adult medical examination without abnormal findings: Secondary | ICD-10-CM

## 2018-10-26 NOTE — Patient Instructions (Addendum)
[name] , Thank you for taking time to come for your Medicare Wellness Visit. I appreciate your ongoing commitment to your health goals. Please review the following plan we discussed and let me know if I can assist you in the future.   Please check your Metformin bottle and make sure you are taking 1000 mg twice a day. I will contact [facility] to see what they need regarding your CPAP supplies.  These are the goals we discussed: Goals    . Exercise 3x per week (30 min per time)     Wants to walk more to gain strength       This is a list of the screening recommended for you and due dates:  Health Maintenance  Topic Date Due  . Urine Protein Check  07/21/2018  . Hemoglobin A1C  02/14/2019  . Eye exam for diabetics  05/06/2019  . Colon Cancer Screening  06/05/2019  . Complete foot exam   08/15/2019  . Mammogram  08/19/2019  . Tetanus Vaccine  05/05/2021  . Flu Shot  Completed  . DEXA scan (bone density measurement)  Completed  .  Hepatitis C: One time screening is recommended by Center for Disease Control  (CDC) for  adults born from [birth year] through 1967.   Completed  . Pneumonia vaccines  Completed    Back Pain, Adult Back pain is very common. The pain often gets better over time. The cause of back pain is usually not dangerous. Most people can learn to manage their back pain on their own. Follow these instructions at home: Watch your back pain for any changes. The following actions may help to lessen any pain you are feeling:  Stay active. Start with short walks on flat ground if you can. Try to walk farther each day.  Exercise regularly as told by your doctor. Exercise helps your back heal faster. It also helps avoid future injury by keeping your muscles strong and flexible.  Do not sit, drive, or stand in one place for more than 30 minutes.  Do not stay in bed. Resting more than 1-2 days can slow down your recovery.  Be careful when you bend or lift an object. Use  good form when lifting: ? Bend at your knees. ? Keep the object close to your body. ? Do not twist.  Sleep on a firm mattress. Lie on your side, and bend your knees. If you lie on your back, put a pillow under your knees.  Take medicines only as told by your doctor.  Put ice on the injured area. ? Put ice in a plastic bag. ? Place a towel between your skin and the bag. ? Leave the ice on for 20 minutes, 2-3 times a day for the first 2-3 days. After that, you can switch between ice and heat packs.  Avoid feeling anxious or stressed. Find good ways to deal with stress, such as exercise.  Maintain a healthy weight. Extra weight puts stress on your back.  Contact a doctor if:  You have pain that does not go away with rest or medicine.  You have worsening pain that goes down into your legs or buttocks.  You have pain that does not get better in one week.  You have pain at night.  You lose weight.  You have a fever or chills. Get help right away if:  You cannot control when you poop (bowel movement) or pee (urinate).  Your arms or legs feel weak.  Your arms or legs lose feeling (numbness).  You feel sick to your stomach (nauseous) or throw up (vomit).  You have belly (abdominal) pain.  You feel like you may pass out (faint). This information is not intended to replace advice given to you by your health care provider. Make sure you discuss any questions you have with your health care provider. Document Released: 05/31/2008 Document Revised: 05/20/2016 Document Reviewed: 04/16/2014 Elsevier Interactive Patient Education  2018 [place] in the Home Falls can cause injuries. They can happen to people of all ages. There are many things you can do to make your home safe and to help prevent falls. What can I do on the outside of my home?  Regularly fix the edges of walkways and driveways and fix any cracks.  Remove anything that might make you trip as  you walk through a door, such as a raised step or threshold.  Trim any bushes or trees on the path to your home.  Use bright outdoor lighting.  Clear any walking paths of anything that might make someone trip, such as rocks or tools.  Regularly check to see if handrails are loose or broken. Make sure that both sides of any steps have handrails.  Any raised decks and porches should have guardrails on the edges.  Have any leaves, snow, or ice cleared regularly.  Use sand or salt on walking paths during winter.  Clean up any spills in your garage right away. This includes oil or grease spills. What can I do in the bathroom?  Use night lights.  Install grab bars by the toilet and in the tub and shower. Do not use towel bars as grab bars.  Use non-skid mats or decals in the tub or shower.  If you need to sit down in the shower, use a plastic, non-slip stool.  Keep the floor dry. Clean up any water that spills on the floor as soon as it happens.  Remove soap buildup in the tub or shower regularly.  Attach bath mats securely with double-sided non-slip rug tape.  Do not have throw rugs and other things on the floor that can make you trip. What can I do in the bedroom?  Use night lights.  Make sure that you have a light by your bed that is easy to reach.  Do not use any sheets or blankets that are too big for your bed. They should not hang down onto the floor.  Have a firm chair that has side arms. You can use this for support while you get dressed.  Do not have throw rugs and other things on the floor that can make you trip. What can I do in the kitchen?  Clean up any spills right away.  Avoid walking on wet floors.  Keep items that you use a lot in easy-to-reach places.  If you need to reach something above you, use a strong step stool that has a grab bar.  Keep electrical cords out of the way.  Do not use floor polish or wax that makes floors slippery. If you must  use wax, use non-skid floor wax.  Do not have throw rugs and other things on the floor that can make you trip. What can I do with my stairs?  Do not leave any items on the stairs.  Make sure that there are handrails on both sides of the stairs and use them. Fix handrails that are broken or loose. Make  sure that handrails are as long as the stairways.  Check any carpeting to make sure that it is firmly attached to the stairs. Fix any carpet that is loose or worn.  Avoid having throw rugs at the top or bottom of the stairs. If you do have throw rugs, attach them to the floor with carpet tape.  Make sure that you have a light switch at the top of the stairs and the bottom of the stairs. If you do not have them, ask someone to add them for you. What else can I do to help prevent falls?  Wear shoes that: ? Do not have high heels. ? Have rubber bottoms. ? Are comfortable and fit you well. ? Are closed at the toe. Do not wear sandals.  If you use a stepladder: ? Make sure that it is fully opened. Do not climb a closed stepladder. ? Make sure that both sides of the stepladder are locked into place. ? Ask someone to hold it for you, if possible.  Clearly mark and make sure that you can see: ? Any grab bars or handrails. ? First and last steps. ? Where the edge of each step is.  Use tools that help you move around (mobility aids) if they are needed. These include: ? Canes. ? Walkers. ? Scooters. ? Crutches.  Turn on the lights when you go into a dark area. Replace any light bulbs as soon as they burn out.  Set up your furniture so you have a clear path. Avoid moving your furniture around.  If any of your floors are uneven, fix them.  If there are any pets around you, be aware of where they are.  Review your medicines with your doctor. Some medicines can make you feel dizzy. This can increase your chance of falling. Ask your doctor what other things that you can do to help prevent  falls. This information is not intended to replace advice given to you by your health care provider. Make sure you discuss any questions you have with your health care provider. Document Released: 10/09/2009 Document Revised: 05/20/2016 Document Reviewed: 01/17/2015 Elsevier Interactive Patient Education  [employer].

## 2018-10-26 NOTE — Progress Notes (Signed)
Subjective:   [name] is a 67 y.o. female who presents for Medicare Annual (Subsequent) preventive examination. The patient was informed that the wellness visit is to identify future health risk and educate and initiate measures that can reduce risk for increased disease through the lifespan.   Review of Systems:  Physical assessment deferred to PCP.  Cardiac Risk Factors include: advanced age (>26men, >44 women);diabetes mellitus;hypertension;obesity (BMI >30kg/m2)     Objective:    Vitals: BP 112/72   Pulse 65   Temp 98.6 F (37 C) (Oral)   Ht 5\' 3"  (1.6 m)   Wt 182 lb (82.6 kg)   LMP  (LMP Unknown)   SpO2 97%   BMI 32.24 kg/m   Body mass index is 32.24 kg/m.  Advanced Directives 10/26/2018 07/18/2018 07/10/2018 05/24/2018 04/04/2018 03/15/2018 10/04/2017  Does Patient Have a Medical Advance Directive? No No No No No No No  Type of Advance Directive - - - - - - -  Copy of Healthcare Power of Attorney in Chart? - - - - - - -  Would patient like information on creating a medical advance directive? Yes (MAU/Ambulatory/Procedural Areas - Information given) - No - Patient declined No - Patient declined No - Patient declined No - Patient declined Yes (MAU/Ambulatory/Procedural Areas - Information given)  Pre-existing out of facility DNR order (yellow form or pink MOST form) - - - - - - -    Tobacco Social History   Tobacco Use  Smoking Status Former Smoker  . Packs/day: 1.50  . Years: 15.00  . Pack years: 22.50  . Types: Cigarettes  . Last attempt to quit: 11/09/1981  . Years since quitting: 36.9  Smokeless Tobacco Never Used  Tobacco Comment   no plans to start     Counseling given: Yes Comment: no plans to start  Clinical Intake:  Pre-visit preparation completed: Yes  Pain : 0-10 Pain Score: 6  Pain Type: Chronic pain Pain Location: Back Pain Orientation: Lower, Right Pain Descriptors / Indicators: Aching Pain Onset: 1 to 4 weeks ago Pain Frequency:  Occasional Pain Relieving Factors: Flexeril helps some Effect of Pain on Daily Activities: hurts to walk  Pain Relieving Factors: Flexeril helps some  Nutritional Status: BMI > 30  Obese Nutritional Risks: Other (Comment) Diabetes: Yes CBG done?: No. CBG at home this AM was 149. Did pt. bring in CBG monitor from home?: No  How often do you need to have someone help you when you read instructions, pamphlets, or other written materials from your doctor or pharmacy?: 1 - Never What is the last grade level you completed in school?: [place] Needed?: No    Past Medical History:  Diagnosis Date  . Anticoagulated on Coumadin   . Arthritis    wrist, hands -  . Atrial fibrillation, persistent   . Bladder tumor   . Bradycardia, sinus   . CAD (coronary artery disease) cardiologist-  dr hochrien   a. PTCA to RCA (02/ 1999);  b.  Myoview 5/10:  EF 71%, no ischemia  . Cataract   . Diabetes ([place])   . Full dentures   . GERD (gastroesophageal reflux disease)   . History of colon polyps    2005  hyperplastic  . History of MI (myocardial infarction)    1999  . HLD (hyperlipidemia)   . Hypertension   . Hypothyroidism   . SUI (stress urinary incontinence, female)   . Wears glasses  Past Surgical History:  Procedure Laterality Date  . CARDIOVASCULAR STRESS TEST  05-01-2009   dr hochrein   normal nuclear study/  no ischemia or scar/  normal LV function and wall motion, ef 71%  . CORONARY ANGIOPLASTY  Feb 1999   PTCA to RCA  . D & C HYSTEROSCOPY/  POLYPECTOMY  05-08-2009  . HYSTEROSCOPY W/D&C  12/07/2012   Procedure: DILATATION AND CURETTAGE /HYSTEROSCOPY;  Surgeon: Elveria Royals, MD;  Location: [facility];  Service: Gynecology;  Laterality: N/A;  Dilitation and curettage /hysteroscopy/biopsey of lower segment of uterus  . TOTAL HIP ARTHROPLASTY Left 12-11-2010  . TRANSTHORACIC ECHOCARDIOGRAM  09-11-2015   mild LVH, ef 55%,  mildly reduced RVSF,  trivial TR  .  TRANSURETHRAL RESECTION OF BLADDER TUMOR N/A 12/01/2015   Procedure: TRANSURETHRAL RESECTION OF BLADDER TUMOR (TURBT);  Surgeon: Kathie Rhodes, MD;  Location: [facility];  Service: Urology;  Laterality: N/A;  . TUBAL LIGATION  1974   Family History  Problem Relation Age of Onset  . Diabetes Mother   . Heart disease Mother   . Colon cancer Mother 43  . Heart disease Father   . Cancer Sister        Lung  . Cancer Brother        intestinal  . Atrial fibrillation Brother   . Cancer Brother        throat ca  . Cancer Brother        bladder  . Breast cancer Neg Hx    Social History   Socioeconomic History  . Marital status: Married    Spouse name: Eddie Dibbles  . Number of children: 5  . Years of education: 55  . Highest education level: 12th grade  Occupational History  . Occupation: retired    Comment: Sport and exercise psychologist  Social Needs  . Financial resource strain: Not hard at all  . Food insecurity:    Worry: Never true    Inability: Never true  . Transportation needs:    Medical: No    Non-medical: No  Tobacco Use  . Smoking status: Former Smoker    Packs/day: 1.50    Years: 15.00    Pack years: 22.50    Types: Cigarettes    Last attempt to quit: 11/09/1981    Years since quitting: 36.9  . Smokeless tobacco: Never Used  . Tobacco comment: no plans to start  Substance and Sexual Activity  . Alcohol use: No  . Drug use: No  . Sexual activity: Yes    Birth control/protection: Post-menopausal  Lifestyle  . Physical activity:    Days per week: 3 days    Minutes per session: 30 min  . Stress: Not at all  Relationships  . Social connections:    Talks on phone: More than three times a week    Gets together: Twice a week    Attends religious service: Never    Active member of club or organization: Yes    Attends meetings of clubs or organizations: More than 4 times per year    Relationship status: Married  Other Topics Concern  . Not on file  Social History  Narrative   Has retired from [employer].  Has 5 adult children all living in the area.      Emergency Contact: husband, Kirbi Farrugia, [phone]   End of Life Plan: discussed AD, gave pt pamphlet to fill out   Who lives with you: husband, one son, one great-grandson  Lives in one level house. No stairs. Smoke alarms, has a grab-bar at the bath tub. No throw rugs on floor.   Diet: Pt has a varied diet of protein, starch and vegetables, fruits. Leans toward vegetable.   Exercise: Pt has no regular exercise routine. Does try to walk at least three times a week.   Seatbelts: Pt reports wearing seatbelt when in vehicles.    Hobbies: likes to play pool, likes to read and do crossword puzzles    Outpatient Encounter Medications as of 10/26/2018  Medication Sig  . calcium carbonate (TUMS - DOSED IN MG ELEMENTAL CALCIUM) 500 MG chewable tablet Chew 1 tablet by mouth as needed for indigestion or heartburn.  Marland Kitchen CARTIA XT 300 MG 24 hr capsule TAKE 1 CAPSULE(300 MG) BY MOUTH DAILY  . ELIQUIS 5 MG TABS tablet TAKE 1 TABLET(5 MG) BY MOUTH TWICE DAILY  . glucose blood (ONETOUCH VERIO) test strip Use as instructed  . [employer] (ONE TOUCH DELICA LANCING DEV) MISC 1 Units by Does not apply route 2 (two) times daily at 8 am and 10 pm.  . levothyroxine (SYNTHROID, LEVOTHROID) 100 MCG tablet Take 1 tablet (100 mcg total) by mouth daily.  Marland Kitchen loratadine (CLARITIN) 10 MG tablet Take 10 mg by mouth daily as needed for allergies.  . metFORMIN (GLUCOPHAGE-XR) 500 MG 24 hr tablet Take 2 tablets (1,000 mg total) by mouth 2 (two) times daily.  Glory Rosebush DELICA LANCETS FINE MISC TEST TWICE DAILY( 8AM AND 10PM)  . rosuvastatin (CRESTOR) 40 MG tablet TAKE 1 TABLET(40 MG) BY MOUTH DAILY  . [DISCONTINUED] diltiazem (DILACOR XR) 120 MG 24 hr capsule Take 1 capsule (120 mg total) by mouth daily.   No facility-administered encounter medications on file as of 10/26/2018.     Activities of Daily Living In your present  state of health, do you have any difficulty performing the following activities: 10/26/2018 07/10/2018  Hearing? N N  Comment Does have cerumen that builds up -  Vision? Y N  Comment Has some scarring from cataract surgery; has appt with eye doctor -  Difficulty concentrating or making decisions? N N  Walking or climbing stairs? N N  Dressing or bathing? N N  Doing errands, shopping? N N  Preparing Food and eating ? N -  Using the Toilet? N -  In the past six months, have you accidently leaked urine? Y -  Comment states becoming worse; sees urologist -  Do you have problems with loss of bowel control? N -  Managing your Medications? N -  Managing your Finances? N -  Housekeeping or managing your Housekeeping? N -  Some recent data might be hidden   Patient Care Team: Sela Hilding, MD as PCP - General (Family Medicine) Minus Breeding, MD as PCP - Cardiology (Cardiology) Joseph Art, OD (Optometry) Kathie Rhodes, MD as Consulting Physician (Urology) Harriett Sine, MD as Consulting Physician (Dermatology)    Assessment:  This is a routine wellness examination for [name].  Exercise Activities and Dietary recommendations Current Exercise Habits: The patient does not participate in regular exercise at present  Goals    . Exercise 3x per week (30 min per time)     Wants to walk more to gain strength       Fall Risk Fall Risk  10/26/2018 05/24/2018 03/15/2018 02/07/2018 10/04/2017  Falls in the past year? No No No No No  Comment - - - - -  Number falls in past yr: - - - - -  Injury with Fall? - - - - -  Comment - - - - -  Risk Factor Category  - - - - -  Risk for fall due to : - - - - -   Is the patient's home free of loose throw rugs in walkways, pet beds, electrical cords, etc?   yes      Grab bars in the bathroom? yes      Handrails on the stairs?   N/A      Adequate lighting?   yes   Depression Screen PHQ 2/9 Scores 10/26/2018 08/14/2018 07/18/2018  05/24/2018  PHQ - 2 Score 0 0 0 0  PHQ- 9 Score - - - -     Cognitive Function MMSE - Mini Mental State Exam 10/26/2018 04/11/2014  Orientation to time 5 5  Orientation to Place 5 5  Registration 3 3  Attention/ Calculation 5 5  Recall 3 2  Language- name 2 objects 2 2  Language- repeat 1 1  Language- follow 3 step command 3 3  Language- read & follow direction 1 1  Write a sentence 1 1  Copy design 1 1  Total score 30 29     6CIT Screen 10/26/2018  What Year? 0 points  What month? 0 points  What time? 0 points  Count back from 20 0 points  Months in reverse 0 points  Repeat phrase 0 points  Total Score 0    Immunization History  Administered Date(s) Administered  . Influenza Split 10/25/2012  . Influenza Whole 10/26/2007, 09/13/2008, 10/17/2009  . Influenza, High Dose Seasonal PF 09/18/2017  . Influenza,inj,Quad PF,6+ Mos 11/08/2014, 09/03/2015  . Influenza-Unspecified 10/25/2016, 10/05/2017, 09/26/2018  . Pneumococcal Conjugate-13 04/23/2014  . Pneumococcal Polysaccharide-23 09/03/2015  . Td 06/26/2000  . Tdap 05/06/2011     Screening Tests Health Maintenance  Topic Date Due  . URINE MICROALBUMIN  07/21/2018  . HEMOGLOBIN A1C  02/14/2019  . OPHTHALMOLOGY EXAM  05/06/2019  . COLONOSCOPY  06/05/2019  . FOOT EXAM  08/15/2019  . MAMMOGRAM  08/19/2019  . TETANUS/TDAP  05/05/2021  . INFLUENZA VACCINE  Completed  . DEXA SCAN  Completed  . Hepatitis C Screening  Completed  . PNA vac Low Risk Adult  Completed    Cancer Screenings: Lung: Low Dose CT Chest recommended if Age 91-80 years, 30 pack-year currently smoking OR have quit w/in 15years. Patient does not qualify. Breast:  Up to date on Mammogram? Yes  Appt 12/07/18 Up to date of Bone Density/Dexa? Yes Colorectal: up to date   Additional Screenings:  Hepatitis C Screening: complete     Plan:  Patient will need urine microalbumin at next DM f/u. Flu shot has already been received. Patient will check  her Metformin bottle at home. She is taking 1 tab BID and prescription says to take 2 tab BID. Last Rx was for 500 mg tabs but previous was for 1000 mg tabs. She will make sure she is taking 1000 mg BID.  Patient stated [facility] needs something from PCP regarding her CPAP supplies. Will send message to [facility] to see what is needed.  I have personally reviewed and noted the following in the patient's chart:   . Medical and social history . Use of alcohol, tobacco or illicit drugs  . Current medications and supplements . Functional ability and status . Nutritional status . Physical activity . Advanced directives . List of other physicians . Hospitalizations, surgeries, and ER visits in previous 12 months . Vitals .  Screenings to include cognitive, depression, and falls . Referrals and appointments  In addition, I have reviewed and discussed with patient certain preventive protocols, quality metrics, and best practice recommendations. A written personalized care plan for preventive services as well as general preventive health recommendations were provided to patient.     Esau Grew, RN  10/26/2018

## 2018-10-26 NOTE — Progress Notes (Signed)
I have reviewed this visit and agree with the documentation.  Ralene Ok, MD

## 2018-10-26 NOTE — Progress Notes (Signed)
Sorry, it's gone now.

## 2018-10-27 DIAGNOSIS — G4733 Obstructive sleep apnea (adult) (pediatric): Secondary | ICD-10-CM | POA: Diagnosis not present

## 2018-11-16 ENCOUNTER — Ambulatory Visit: Payer: Medicare Other | Admitting: Cardiology

## 2018-11-21 DIAGNOSIS — G4733 Obstructive sleep apnea (adult) (pediatric): Secondary | ICD-10-CM | POA: Diagnosis not present

## 2018-11-24 NOTE — Progress Notes (Signed)
HPI The patient presents for evaluation of atrial fibrillation.  She was see in the hospital in August after syncope but there was not a clear etiology and other than probable dehydration.  She did have a complete work up with an unremarkable echo, negative perfusion study and an event monitor without pauses and with chronic atrial fib.  She returns for follow up.    Since that event she has had no syncope.  She does not feel her heart beating.  She takes her Inis Sizer to the bus every morning and then she goes for a walk.  He is 67 years old.  She denies any cardiovascular symptoms. The patient denies any new symptoms such as chest discomfort, neck or arm discomfort. There has been no new shortness of breath, PND or orthopnea. There have been no reported palpitations, presyncope or syncope.    MEDS Prior to Admission medications   Medication Sig Start Date End Date Taking? Authorizing Provider  apixaban (ELIQUIS) 5 MG TABS tablet Take 1 tablet (5 mg total) by mouth 2 (two) times daily. 02/28/17  Yes Minus Breeding, MD  calcium carbonate (TUMS - DOSED IN MG ELEMENTAL CALCIUM) 500 MG chewable tablet Chew 1 tablet by mouth as needed for indigestion or heartburn.   Yes [provider]  carbamide peroxide (DEBROX) 6.5 % otic solution Place 5 drops into both ears 2 (two) times daily. Patient taking differently: Place 5 drops into both ears once a week.  12/26/15  Yes Haney, Alyssa A, MD  CARTIA XT 300 MG 24 hr capsule TAKE 1 CAPSULE(300 MG) BY MOUTH DAILY 03/15/17  Yes Minus Breeding, MD  fluticasone (FLONASE) 50 MCG/ACT nasal spray Place 2 sprays into both nostrils daily. Patient taking differently: Place 2 sprays into both nostrils daily as needed.  11/08/14  Yes Rosemarie Ax, MD  glucose blood [facility] VERIO) test strip Use as instructed 03/14/17  Yes Sela Hilding, MD  Lancet Devices (ONE TOUCH DELICA LANCING DEV) MISC 1 Units by Does not apply route 2 (two)  times daily at 8 am and 10 pm. 03/14/17  Yes Sela Hilding, MD  levothyroxine (SYNTHROID, LEVOTHROID) 112 MCG tablet Take 1 tablet (112 mcg total) by mouth daily. 03/17/17  Yes Sela Hilding, MD  metFORMIN (GLUCOPHAGE-XR) 500 MG 24 hr tablet Take 2 tablets (1,000 mg total) by mouth 2 (two) times daily. 10/04/17  Yes Sela Hilding, MD  nitroGLYCERIN (NITROSTAT) 0.4 MG SL tablet PLACE 1 TABLET UNDER TONGUE EVERY 5 MINUTES AS NEEDED FOR CHEST PAIN 09/15/15  Yes Rosemarie Ax, MD  [facility] DELICA LANCETS FINE MISC TEST TWICE DAILY( 8AM AND 10PM) 10/03/17  Yes Sela Hilding, MD  rosuvastatin (CRESTOR) 40 MG tablet Take 1 tablet (40 mg total) by mouth daily. 02/28/17  Yes Minus Breeding, MD      Past Medical History:  Diagnosis Date  . Anticoagulated on Coumadin   . Arthritis    wrist, hands -  . Atrial fibrillation, persistent   . Bladder tumor   . CAD (coronary artery disease) cardiologist-  dr hochrien   PTCA RCA 1999  . Cataract   . Diabetes ([place])   . Full dentures   . GERD (gastroesophageal reflux disease)   . History of colon polyps    2005  hyperplastic  . History of MI (myocardial infarction)    1999  . HLD (hyperlipidemia)   . Hypertension   . Hypothyroidism   . SUI (stress urinary incontinence, female)   .  Wears glasses      Past Surgical History:  Procedure Laterality Date  . CARDIOVASCULAR STRESS TEST  05-01-2009   dr Zackory Pudlo   normal nuclear study/  no ischemia or scar/  normal LV function and wall motion, ef 71%  . CORONARY ANGIOPLASTY  Feb 1999   PTCA to RCA  . D & C HYSTEROSCOPY/  POLYPECTOMY  05-08-2009  . HYSTEROSCOPY W/D&C  12/07/2012   Procedure: DILATATION AND CURETTAGE /HYSTEROSCOPY;  Surgeon: Elveria Royals, MD;  Location: [facility];  Service: Gynecology;  Laterality: N/A;  Dilitation and curettage /hysteroscopy/biopsey of lower segment of uterus  . TOTAL HIP ARTHROPLASTY Left 12-11-2010  . TRANSTHORACIC ECHOCARDIOGRAM  09-11-2015    mild LVH, ef 55%,  mildly reduced RVSF,  trivial TR  . TRANSURETHRAL RESECTION OF BLADDER TUMOR N/A 12/01/2015   Procedure: TRANSURETHRAL RESECTION OF BLADDER TUMOR (TURBT);  Surgeon: Kathie Rhodes, MD;  Location: [facility];  Service: Urology;  Laterality: N/A;  . TUBAL LIGATION  1974     ROS:   As stated in the HPI and negative for all other systems.   PHYSICAL EXAM BP 122/72   Pulse 84   Ht 5\' 3"  (1.6 m)   Wt 179 lb 6.4 oz (81.4 kg)   LMP  (LMP Unknown)   SpO2 94%   BMI 31.78 kg/m   GENERAL:  Well appearing NECK:  No jugular venous distention, waveform within normal limits, carotid upstroke brisk and symmetric, no bruits, no thyromegaly LUNGS:  Clear to auscultation bilaterally CHEST:  Unremarkable HEART:  PMI not displaced or sustained,S1 and S2 within normal limits, no S3,  no clicks, no rubs, no murmurs, irregular ABD:  Flat, positive bowel sounds normal in frequency in pitch, no bruits, no rebound, no guarding, no midline pulsatile mass, no hepatomegaly, no splenomegaly EXT:  2 plus pulses throughout, no edema, no cyanosis no clubbing   Lab Results  Component Value Date   CHOL 139 09/19/2018   TRIG 115 09/19/2018   HDL 60 09/19/2018   LDLCALC 56 09/19/2018   LDLDIRECT 125 (H) 06/01/2010   Lab Results  Component Value Date   HGBA1C 5.8 08/14/2018    ASSESSMENT AND PLAN  ATRIAL FIBRILLATION -  [name] has a CHA2DS2 - VASc score of 3 with a risk of stroke of 3.2%.   She is had good rate control.  Tolerates anticoagulation.  No change in therapy.  ESSENTIAL HYPERTENSION, BENIGN -  The blood pressure is at target.  No change in therapy.   CAD - She had a negative perfusion study as above.  She will continue with risk reduction.gressive risk reduction and meds as listed.   DYSLIPIDEMIA - Her LDL is excellent as above.  No change in therapy.  DM - A1c was 5.6.  She will continue on the meds as listed.

## 2018-11-27 ENCOUNTER — Encounter: Payer: Self-pay | Admitting: Cardiology

## 2018-11-27 ENCOUNTER — Ambulatory Visit (INDEPENDENT_AMBULATORY_CARE_PROVIDER_SITE_OTHER): Payer: Medicare Other | Admitting: Cardiology

## 2018-11-27 VITALS — BP 122/72 | HR 84 | Ht 63.0 in | Wt 179.4 lb

## 2018-11-27 DIAGNOSIS — I10 Essential (primary) hypertension: Secondary | ICD-10-CM

## 2018-11-27 DIAGNOSIS — Z85828 Personal history of other malignant neoplasm of skin: Secondary | ICD-10-CM | POA: Diagnosis not present

## 2018-11-27 DIAGNOSIS — E785 Hyperlipidemia, unspecified: Secondary | ICD-10-CM | POA: Diagnosis not present

## 2018-11-27 DIAGNOSIS — L738 Other specified follicular disorders: Secondary | ICD-10-CM | POA: Diagnosis not present

## 2018-11-27 DIAGNOSIS — I482 Chronic atrial fibrillation, unspecified: Secondary | ICD-10-CM

## 2018-11-27 DIAGNOSIS — I2510 Atherosclerotic heart disease of native coronary artery without angina pectoris: Secondary | ICD-10-CM

## 2018-11-27 NOTE — Patient Instructions (Signed)

## 2018-12-02 ENCOUNTER — Other Ambulatory Visit: Payer: Self-pay | Admitting: Cardiology

## 2018-12-06 ENCOUNTER — Encounter: Payer: Self-pay | Admitting: Family Medicine

## 2018-12-06 ENCOUNTER — Ambulatory Visit (INDEPENDENT_AMBULATORY_CARE_PROVIDER_SITE_OTHER): Payer: Medicare Other | Admitting: Family Medicine

## 2018-12-06 ENCOUNTER — Other Ambulatory Visit: Payer: Self-pay

## 2018-12-06 VITALS — BP 110/61 | HR 78 | Temp 97.8°F | Wt 179.0 lb

## 2018-12-06 DIAGNOSIS — R809 Proteinuria, unspecified: Secondary | ICD-10-CM | POA: Diagnosis not present

## 2018-12-06 DIAGNOSIS — H6123 Impacted cerumen, bilateral: Secondary | ICD-10-CM | POA: Diagnosis not present

## 2018-12-06 DIAGNOSIS — R05 Cough: Secondary | ICD-10-CM

## 2018-12-06 DIAGNOSIS — E039 Hypothyroidism, unspecified: Secondary | ICD-10-CM

## 2018-12-06 DIAGNOSIS — E119 Type 2 diabetes mellitus without complications: Secondary | ICD-10-CM | POA: Diagnosis not present

## 2018-12-06 DIAGNOSIS — I482 Chronic atrial fibrillation, unspecified: Secondary | ICD-10-CM | POA: Diagnosis not present

## 2018-12-06 DIAGNOSIS — R058 Other specified cough: Secondary | ICD-10-CM

## 2018-12-06 LAB — POCT GLYCOSYLATED HEMOGLOBIN (HGB A1C): HbA1c, POC (controlled diabetic range): 6 % (ref 0.0–7.0)

## 2018-12-06 LAB — POCT UA - MICROALBUMIN
Creatinine, POC: 300 mg/dL
Microalbumin Ur, POC: 150 mg/L

## 2018-12-06 MED ORDER — DILTIAZEM HCL ER COATED BEADS 300 MG PO CP24: ORAL_CAPSULE | ORAL | 3 refills | Status: DC

## 2018-12-06 NOTE — Progress Notes (Signed)
   CC: DM, cough   HPI  DM- checking CBGs every so often, not daily. No lows. Feeling well. Walking for exercise.   Cough - started 2 weeks ago. Cough is worst at night. Left ear feels clogged up. No face pain. 99 temp last week. No fevers recently. No dysuria. Husband also has it. Has tried robutissin. Unfortunately, her daughter is in the ICU with COPD exacerbation.    afib - can feel it when her rate goes higher, no other feelings of syncope since July. Cardiology was pleased with the regimen at recent visit.   Thyroid - no symptoms that she knows of. Feels well overall other than recent cold.   ROS: Denies CP, SOB, abdominal pain, dysuria, changes in BMs.   CC, SH/smoking status, and VS noted  Objective: BP 110/61   Pulse 78   Temp 97.8 F (36.6 C) (Oral)   Wt 179 lb (81.2 kg)   LMP  (LMP Unknown)   SpO2 98%   BMI 31.71 kg/m  Gen: NAD, alert, cooperative, and pleasant. HEENT: NCAT, EOMI, PERRL, bilateral cerumen impaction, TMs clear after removal. oropharnyx clear.  CV: RRR, no murmur Resp: CTAB, no wheezes, non-labored Ext: No edema, warm Neuro: Alert and oriented, Speech clear, No gross deficits  Assessment and plan:  Bilateral cerumen impaction - CMA washed out, R ear required curette removal, TMs clear afterward.   Post viral cough syndrome - less likely secondary infection or worsening. Trial flonase for post nasal drip component.   Atrial fibrillation ([place]) Rate well controlled on dilt 300mg , has been at this dose for many years. BP is great as well, tends to run 110s over 60s. I have messaged Dr. Percival Spanish for input on BP regimen as below.   Hypothyroidism Recheck TSH today. No sxs.   Diabetes ([place]) Continued excellent control. Continue metformin.   Microalbuminuria Persistent x 3 samples. Would like to start this patient on a low dose ACEi or ARB (Personally reviewed the evidence- there is evidence benefit in hypertensive patients, and lower quality of  evidence in normotensive patients) for protection of evolution to CKD. However, she did have a syncopal episode in July, which would make [place] cautious with hypotension. In addition, her diltiazem is managed with Dr. Percival Spanish (cards) and her rate has been stable on this 300mg  dose for many years. I suspect if we added a low dose ACEi we would need to lower her dilt dose. I have messaged Dr. Percival Spanish for his thoughts. I did appreciate some evidence of benefit of CCBs in protection of CKD although less robust than ACEi.    Orders Placed This Encounter  Procedures  . CBC  . Basic metabolic panel  . TSH  . HgB A1c  . POCT UA - Microalbumin    No orders of the defined types were placed in this encounter.   Ralene Ok, MD, PGY3 12/07/2018 8:14 AM

## 2018-12-06 NOTE — Patient Instructions (Signed)
It was a pleasure to see you today! Thank you for choosing [facility] for your primary care. [name] was seen for DM, cough, ears.   Our plans for today were:  Your ears should feel much better.   Please try the flonase for the cough.   I will call you once I talk to Dr. Percival Spanish.   Keep doing a great job with your diabetes.    Best,  Dr. Lindell Noe

## 2018-12-07 ENCOUNTER — Telehealth: Payer: Self-pay | Admitting: *Deleted

## 2018-12-07 ENCOUNTER — Ambulatory Visit: Payer: Medicare Other

## 2018-12-07 DIAGNOSIS — R809 Proteinuria, unspecified: Secondary | ICD-10-CM | POA: Insufficient documentation

## 2018-12-07 LAB — BASIC METABOLIC PANEL
BUN/Creatinine Ratio: 10 — ABNORMAL LOW (ref 12–28)
BUN: 11 mg/dL (ref 8–27)
CO2: 23 mmol/L (ref 20–29)
Calcium: 9.6 mg/dL (ref 8.7–10.3)
Chloride: 100 mmol/L (ref 96–106)
Creatinine, Ser: 1.05 mg/dL — ABNORMAL HIGH (ref 0.57–1.00)
GFR calc Af Amer: 63 mL/min/{1.73_m2} (ref >59)
GFR calc non Af Amer: 54 mL/min/{1.73_m2} — ABNORMAL LOW (ref >59)
Glucose: 113 mg/dL — ABNORMAL HIGH (ref 65–99)
Potassium: 4.3 mmol/L (ref 3.5–5.2)
Sodium: 140 mmol/L (ref 134–144)

## 2018-12-07 LAB — CBC
Hematocrit: 37.4 % (ref 34.0–46.6)
Hemoglobin: 12.2 g/dL (ref 11.1–15.9)
MCH: 28.4 pg (ref 26.6–33.0)
MCHC: 32.6 g/dL (ref 31.5–35.7)
MCV: 87 fL (ref 79–97)
Platelets: 294 10*3/uL (ref 150–450)
RBC: 4.3 x10E6/uL (ref 3.77–5.28)
RDW: 13.6 % (ref 12.3–15.4)
WBC: 11.5 10*3/uL — ABNORMAL HIGH (ref 3.4–10.8)

## 2018-12-07 LAB — TSH: TSH: 0.566 u[IU]/mL (ref 0.450–4.500)

## 2018-12-07 NOTE — Telephone Encounter (Signed)
-----   Message from Sela Hilding, MD sent at 12/07/2018  8:16 AM EST ----- White team, please call patient. Her thyroid is stable, and we don't need to change any medicines. I will call her when I hear back from her cardiologist about her blood pressure medicines, and for now she should keep taking the diltiazem as she has for many years.

## 2018-12-07 NOTE — Telephone Encounter (Signed)
Pt informed of below. Zimmerman Rumple, April D, CMA  

## 2018-12-07 NOTE — Assessment & Plan Note (Signed)
Rate well controlled on dilt 300mg , has been at this dose for many years. BP is great as well, tends to run 110s over 60s. I have messaged Dr. Percival Spanish for input on BP regimen as below.

## 2018-12-07 NOTE — Assessment & Plan Note (Signed)
Persistent x 3 samples. Would like to start this patient on a low dose ACEi or ARB (Personally reviewed the evidence- there is evidence benefit in hypertensive patients, and lower quality of evidence in normotensive patients) for protection of evolution to CKD. However, she did have a syncopal episode in July, which would make [place] cautious with hypotension. In addition, her diltiazem is managed with Dr. Percival Spanish (cards) and her rate has been stable on this 300mg  dose for many years. I suspect if we added a low dose ACEi we would need to lower her dilt dose. I have messaged Dr. Percival Spanish for his thoughts. I did appreciate some evidence of benefit of CCBs in protection of CKD although less robust than ACEi.

## 2018-12-07 NOTE — Assessment & Plan Note (Signed)
Recheck TSH today. No sxs.

## 2018-12-07 NOTE — Assessment & Plan Note (Signed)
Continued excellent control. Continue metformin.

## 2018-12-16 ENCOUNTER — Telehealth: Payer: Self-pay | Admitting: Family Medicine

## 2018-12-16 DIAGNOSIS — E119 Type 2 diabetes mellitus without complications: Secondary | ICD-10-CM

## 2018-12-16 MED ORDER — LISINOPRIL 2.5 MG PO TABS: 2.5000 mg | ORAL_TABLET | Freq: Every day | ORAL | 0 refills | Status: DC

## 2018-12-16 MED ORDER — DILTIAZEM HCL ER COATED BEADS 240 MG PO CP24: ORAL_CAPSULE | ORAL | 0 refills | Status: DC

## 2018-12-16 NOTE — Telephone Encounter (Signed)
Discussed patient's care with Dr. Percival Spanish as noted at last visit - he recommended decreasing diltiazem to 240mg . I actually ran into [name] in the hospital visiting a family member, and she gave me permission to discuss her medications with family around. She is willing to try changing dilt to 240mg  and adding 2.5mg  of lisinopril for kidney protection. I recommended that we recheck BMP in 10 days or so. White team, can you get her a lab appt sometime in the first week of January? I am sending new prescriptions now. I would also like to see her back in later January to see how these are going for her (ok to double book). Please help her make that appt as well.

## 2018-12-21 DIAGNOSIS — G4733 Obstructive sleep apnea (adult) (pediatric): Secondary | ICD-10-CM | POA: Diagnosis not present

## 2018-12-21 NOTE — Telephone Encounter (Signed)
Appointment made for 12/29/2018 for lab work. She will make appt for later in January when she comes in on the 3rd. Ottis Stain, CMA

## 2018-12-21 NOTE — Telephone Encounter (Signed)
BMP ordered as future order. Ottis Stain, CMA

## 2018-12-29 ENCOUNTER — Other Ambulatory Visit: Payer: Medicare Other

## 2018-12-29 DIAGNOSIS — E119 Type 2 diabetes mellitus without complications: Secondary | ICD-10-CM

## 2018-12-30 LAB — BASIC METABOLIC PANEL
BUN/Creatinine Ratio: 15 (ref 12–28)
BUN: 15 mg/dL (ref 8–27)
CO2: 21 mmol/L (ref 20–29)
Calcium: 9.5 mg/dL (ref 8.7–10.3)
Chloride: 103 mmol/L (ref 96–106)
Creatinine, Ser: 0.99 mg/dL (ref 0.57–1.00)
GFR calc Af Amer: 67 mL/min/{1.73_m2} (ref >59)
GFR calc non Af Amer: 58 mL/min/{1.73_m2} — ABNORMAL LOW (ref >59)
Glucose: 120 mg/dL — ABNORMAL HIGH (ref 65–99)
Potassium: 4.6 mmol/L (ref 3.5–5.2)
Sodium: 140 mmol/L (ref 134–144)

## 2019-01-03 ENCOUNTER — Telehealth: Payer: Self-pay | Admitting: *Deleted

## 2019-01-03 NOTE — Telephone Encounter (Signed)
-----   Message from Sela Hilding, MD sent at 12/31/2018  7:02 PM EST ----- White team, please let patient know that her labs look great and she should continue the low dose lisinopril.

## 2019-01-03 NOTE — Telephone Encounter (Signed)
Pt informed of below. Zimmerman Rumple, Adonica Fukushima D, CMA  

## 2019-01-16 ENCOUNTER — Ambulatory Visit: Payer: Medicare Other

## 2019-01-21 DIAGNOSIS — G4733 Obstructive sleep apnea (adult) (pediatric): Secondary | ICD-10-CM | POA: Diagnosis not present

## 2019-02-01 DIAGNOSIS — Z8551 Personal history of malignant neoplasm of bladder: Secondary | ICD-10-CM | POA: Diagnosis not present

## 2019-02-05 DIAGNOSIS — G4733 Obstructive sleep apnea (adult) (pediatric): Secondary | ICD-10-CM | POA: Diagnosis not present

## 2019-02-21 DIAGNOSIS — G4733 Obstructive sleep apnea (adult) (pediatric): Secondary | ICD-10-CM | POA: Diagnosis not present

## 2019-02-26 ENCOUNTER — Encounter: Payer: Self-pay | Admitting: Family Medicine

## 2019-02-26 ENCOUNTER — Other Ambulatory Visit: Payer: Self-pay

## 2019-02-26 ENCOUNTER — Ambulatory Visit (INDEPENDENT_AMBULATORY_CARE_PROVIDER_SITE_OTHER): Payer: Medicare Other | Admitting: Family Medicine

## 2019-02-26 ENCOUNTER — Ambulatory Visit
Admission: RE | Admit: 2019-02-26 | Discharge: 2019-02-26 | Disposition: A | Discharge status: Home / Self Care | Payer: Medicare Other | Source: Ambulatory Visit | Attending: Family Medicine | Admitting: Family Medicine

## 2019-02-26 ENCOUNTER — Other Ambulatory Visit: Payer: Self-pay | Admitting: *Deleted

## 2019-02-26 VITALS — BP 140/58 | HR 81 | Temp 97.9°F | Ht 63.0 in | Wt 173.6 lb

## 2019-02-26 DIAGNOSIS — R809 Proteinuria, unspecified: Secondary | ICD-10-CM

## 2019-02-26 DIAGNOSIS — E039 Hypothyroidism, unspecified: Secondary | ICD-10-CM | POA: Diagnosis not present

## 2019-02-26 DIAGNOSIS — I482 Chronic atrial fibrillation, unspecified: Secondary | ICD-10-CM

## 2019-02-26 DIAGNOSIS — Z1231 Encounter for screening mammogram for malignant neoplasm of breast: Secondary | ICD-10-CM | POA: Diagnosis not present

## 2019-02-26 DIAGNOSIS — E119 Type 2 diabetes mellitus without complications: Secondary | ICD-10-CM

## 2019-02-26 LAB — POCT GLYCOSYLATED HEMOGLOBIN (HGB A1C): HbA1c, POC (controlled diabetic range): 6 % (ref 0.0–7.0)

## 2019-02-26 MED ORDER — LISINOPRIL 2.5 MG PO TABS: 2.5000 mg | ORAL_TABLET | Freq: Every day | ORAL | 3 refills | Status: DC

## 2019-02-26 MED ORDER — METFORMIN HCL ER 500 MG PO TB24: 500.0000 mg | ORAL_TABLET | Freq: Two times a day (BID) | ORAL | 3 refills | Status: DC

## 2019-02-26 NOTE — Progress Notes (Signed)
   CC: DM, thyroid  HPI  Thyroid - doing well. She has 5 days left of synthroid. She has lost 6lbs but not intentionally. She was walking a lot with her daughter being in the hospital.   DM - taking metformin 1000mg  BID, checks CBG about every 3-5 days. No lows. She is walking a lot.   Afib - was able to decrease her meds as recommended. She hasn't appreciated any rapid heartbeat and her pulse at home has been good.   HM - did mammogram today.   Wt Readings from Last 3 Encounters:  02/26/19 173 lb 9.6 oz (78.7 kg)  12/06/18 179 lb (81.2 kg)  11/27/18 179 lb 6.4 oz (81.4 kg)   ROS: Denies CP, SOB, abdominal pain, dysuria, changes in BMs.   CC, SH/smoking status, and VS noted  Objective: BP (!) 140/58   Pulse 81   Temp 97.9 F (36.6 C) (Oral)   Ht 5\' 3"  (1.6 m)   Wt 173 lb 9.6 oz (78.7 kg)   LMP  (LMP Unknown)   SpO2 96%   BMI 30.75 kg/m  Gen: NAD, alert, cooperative, and pleasant. HEENT: NCAT, EOMI, PERRL CV: RRR, no murmur Resp: CTAB, no wheezes, non-labored Ext: No edema, warm Neuro: Alert and oriented, Speech clear, No gross deficits  Assessment and plan:  Atrial fibrillation (HCC) Controlled rate on my exam today, continue decreased dose dilt.   Hypothyroidism Recheck TSH today, will send requested refill once that results in case we need to change her dose.   Microalbuminuria Continue lisinopril, recheck BMP today. Refill sent.    Orders Placed This Encounter  Procedures  . Basic metabolic panel  . TSH  . HgB A1c    Meds ordered this encounter  Medications  . lisinopril (PRINIVIL,ZESTRIL) 2.5 MG tablet    Sig: Take 1 tablet (2.5 mg total) by mouth daily.    Dispense:  90 tablet    Refill:  3  . metFORMIN (GLUCOPHAGE-XR) 500 MG 24 hr tablet    Sig: Take 1 tablet (500 mg total) by mouth 2 (two) times daily.    Dispense:  180 tablet    Refill:  3    Health Maintenance reviewed - mammo done today, awaiting results, UTD otherwise.  Ralene Ok, MD, PGY3 02/26/2019 3:59 PM

## 2019-02-26 NOTE — Assessment & Plan Note (Addendum)
Controlled rate on my exam today, continue decreased dose dilt.

## 2019-02-26 NOTE — Assessment & Plan Note (Signed)
Recheck TSH today, will send requested refill once that results in case we need to change her dose.

## 2019-02-26 NOTE — Assessment & Plan Note (Signed)
Continue lisinopril, recheck BMP today. Refill sent.

## 2019-02-26 NOTE — Patient Instructions (Signed)
It was a pleasure to see you today! Thank you for choosing [facility] for your primary care. [name] was seen for diabetes and checkup .   Our plans for today were:  Decrease your metformin to 1 pill (500mg ) twice per day. We will recheck in 6 months.   Keep taking the lisinopril.   I will send your thyroid medicine tomorrow if your number is ok.    Best,  Dr. Lindell Noe

## 2019-02-27 LAB — BASIC METABOLIC PANEL
BUN/Creatinine Ratio: 15 (ref 12–28)
BUN: 18 mg/dL (ref 8–27)
CO2: 22 mmol/L (ref 20–29)
Calcium: 9.7 mg/dL (ref 8.7–10.3)
Chloride: 100 mmol/L (ref 96–106)
Creatinine, Ser: 1.2 mg/dL — ABNORMAL HIGH (ref 0.57–1.00)
GFR calc Af Amer: 53 mL/min/{1.73_m2} — ABNORMAL LOW (ref >59)
GFR calc non Af Amer: 46 mL/min/{1.73_m2} — ABNORMAL LOW (ref >59)
Glucose: 110 mg/dL — ABNORMAL HIGH (ref 65–99)
Potassium: 4.8 mmol/L (ref 3.5–5.2)
Sodium: 138 mmol/L (ref 134–144)

## 2019-02-27 LAB — TSH: TSH: 0.938 u[IU]/mL (ref 0.450–4.500)

## 2019-02-27 MED ORDER — LEVOTHYROXINE SODIUM 100 MCG PO TABS: 100.0000 ug | ORAL_TABLET | Freq: Every day | ORAL | 3 refills | Status: DC

## 2019-03-05 ENCOUNTER — Other Ambulatory Visit: Payer: Self-pay

## 2019-03-05 MED ORDER — DILTIAZEM HCL ER COATED BEADS 240 MG PO CP24: ORAL_CAPSULE | ORAL | 0 refills | Status: DC

## 2019-03-05 NOTE — Telephone Encounter (Signed)
I will forward refill request to Dr. Rosezella [place] office, as patient's afib is managed by cardiology. I did fill her diltiazem last time as we were adjusting dose to add her lisinopril for renal protection. I called her and let her know I was forwarding it to him and to call his office if she has concerns. She has a week or so left.

## 2019-03-12 DIAGNOSIS — G4733 Obstructive sleep apnea (adult) (pediatric): Secondary | ICD-10-CM | POA: Diagnosis not present

## 2019-03-22 DIAGNOSIS — G4733 Obstructive sleep apnea (adult) (pediatric): Secondary | ICD-10-CM | POA: Diagnosis not present

## 2019-04-22 DIAGNOSIS — G4733 Obstructive sleep apnea (adult) (pediatric): Secondary | ICD-10-CM | POA: Diagnosis not present

## 2019-05-18 ENCOUNTER — Ambulatory Visit
Admission: RE | Admit: 2019-05-18 | Discharge: 2019-05-18 | Disposition: A | Discharge status: Home / Self Care | Payer: Medicare Other | Source: Ambulatory Visit | Attending: Family Medicine | Admitting: Family Medicine

## 2019-05-18 ENCOUNTER — Ambulatory Visit (INDEPENDENT_AMBULATORY_CARE_PROVIDER_SITE_OTHER): Payer: Medicare Other | Admitting: Family Medicine

## 2019-05-18 ENCOUNTER — Encounter: Payer: Self-pay | Admitting: Family Medicine

## 2019-05-18 ENCOUNTER — Other Ambulatory Visit: Payer: Self-pay

## 2019-05-18 VITALS — BP 132/62 | HR 100 | Wt 180.0 lb

## 2019-05-18 DIAGNOSIS — M25561 Pain in right knee: Secondary | ICD-10-CM

## 2019-05-18 DIAGNOSIS — M25562 Pain in left knee: Secondary | ICD-10-CM | POA: Diagnosis not present

## 2019-05-18 MED ORDER — GABAPENTIN 100 MG PO CAPS: 100.0000 mg | ORAL_CAPSULE | Freq: Every day | ORAL | 0 refills | Status: DC

## 2019-05-18 MED ORDER — DICLOFENAC SODIUM 1 % TD GEL: 4.0000 g | Freq: Four times a day (QID) | TRANSDERMAL | 0 refills | Status: DC

## 2019-05-18 NOTE — Patient Instructions (Addendum)
For your hand tingling, I think this is neuropathy. You can take the gabapentin at night, as it can make you sleepy. You can take 100mg  for a week or so and if no improvement you can add another pill for 200mg  or up to 3 pills for 300mg . Call me if this changes.   For your knee, consider a compression sleeve. Use the topical gel and call if no improvement. If lots worse or new swelling, call me urgently or go to the ED.   I will call you with your xray results.   [phone] for XR

## 2019-05-18 NOTE — Progress Notes (Signed)
   CC: R knee pain   HPI  Hand tingling - son has neuropathy, she wondered about this. The tingling started 1 month ago, she has been dropping things off and on for years from both hands, no change in this lately. She denies neck pain. No hx of neck surgery.   R knee pain and swelling - not red, no rash, no fever, no hx of gout. Did fall onto kneecap 6 weeks ago from standing. No hx of surgery to this knee. Has been trying APAP with some help in pain, no change in swelling. Has not tried ice/heat. No lower leg swelling or pain.   ROS: Denies CP, SOB, abdominal pain, dysuria, changes in BMs.   CC, SH/smoking status, and VS noted  Objective: BP 132/62   Pulse 100   Wt 180 lb (81.6 kg)   LMP  (LMP Unknown)   SpO2 93%   BMI 31.89 kg/m  Gen: NAD, alert, cooperative, and pleasant. HEENT: NCAT, EOMI, PERRL CV: RRR, no murmur Resp: CTAB, no wheezes, non-labored Abd: SNTND, BS present, no guarding or organomegaly Ext: No edema, warm. R knee with suprapatellar effusion and pain to posterior fossa, ligaments intact to anterior and posterior drawer +vargus and valgus stress, no skin changes, not warmer than remainder of extremity.   Neuro: Alert and oriented, Speech clear, No gross deficits  Assessment and plan:  R knee swelling and pain - low likihood of gout. Likely OA effusion.  Given fall, will image with x-ray.  Use compression sleeve, topical Voltaren gel, ice or heat.  Call if worsening or no improvement.  Hand tingling: Possibly peripheral neuropathy as patient has a longstanding history of diabetes although has not been poorly controlled.  Less likely spinal cord issue as it is symmetrical and nonacute.  Good strength on exam.  We will trial gabapentin 100 mg nightly.  Patient does have mild CKD and thus would need twice daily dosing of gabapentin.  She can increase it 300 nightly if it is helping but she still having tingling.  Recheck at next visit.  Orders Placed This Encounter   Procedures  . DG Knee Bilateral Standing AP    Standing Status:   Future    Number of Occurrences:   1    Standing Expiration Date:   11/14/2019    Order Specific Question:   Reason for Exam (SYMPTOM  OR DIAGNOSIS REQUIRED)    Answer:   knee pain and effusion    Order Specific Question:   Preferred imaging location?    Answer:   [facility]    Meds ordered this encounter  Medications  . diclofenac sodium (VOLTAREN) 1 % GEL    Sig: Apply 4 g topically 4 (four) times daily.    Dispense:  100 g    Refill:  0  . gabapentin (NEURONTIN) 100 MG capsule    Sig: Take 1 capsule (100 mg total) by mouth at bedtime.    Dispense:  90 capsule    Refill:  0     Ralene Ok, MD, PGY3 05/19/2019 8:47 PM

## 2019-05-22 DIAGNOSIS — G4733 Obstructive sleep apnea (adult) (pediatric): Secondary | ICD-10-CM | POA: Diagnosis not present

## 2019-06-01 ENCOUNTER — Encounter: Payer: Self-pay | Admitting: Internal Medicine

## 2019-06-01 ENCOUNTER — Other Ambulatory Visit: Payer: Self-pay

## 2019-06-01 ENCOUNTER — Ambulatory Visit (INDEPENDENT_AMBULATORY_CARE_PROVIDER_SITE_OTHER): Payer: Medicare Other | Admitting: Internal Medicine

## 2019-06-01 ENCOUNTER — Telehealth: Payer: Self-pay

## 2019-06-01 VITALS — Ht 63.0 in | Wt 180.0 lb

## 2019-06-01 DIAGNOSIS — Z800 Family history of malignant neoplasm of digestive organs: Secondary | ICD-10-CM | POA: Diagnosis not present

## 2019-06-01 DIAGNOSIS — Z7901 Long term (current) use of anticoagulants: Secondary | ICD-10-CM | POA: Diagnosis not present

## 2019-06-01 DIAGNOSIS — I4891 Unspecified atrial fibrillation: Secondary | ICD-10-CM | POA: Diagnosis not present

## 2019-06-01 DIAGNOSIS — Z86010 Personal history of colonic polyps: Secondary | ICD-10-CM | POA: Diagnosis not present

## 2019-06-01 NOTE — Telephone Encounter (Signed)
Patient with diagnosis of afib on Eliquis for anticoagulation.    Procedure: colonoscopy Date of procedure: 06/20/2019  CHADS2-VASc score of  5 (CHF, HTN, AGE, DM2, stroke/tia x 2, CAD, AGE, female)  CrCl 82ml/min  Per office protocol, patient can hold Eliquis for 1 day prior to procedure.

## 2019-06-01 NOTE — Telephone Encounter (Signed)
   Primary Cardiologist: Minus Breeding, MD  Chart reviewed as part of pre-operative protocol coverage. Given past medical history and time since last visit, based on ACC/AHA guidelines, BERLYNN WARSAME would be at acceptable risk for the planned procedure without further cardiovascular testing.   OK to hold Eliquis 24 hours pre op.   I will route this recommendation to the requesting party via Epic fax function and remove from pre-op pool.  Please call with questions.  Kerin Ransom, PA-C 06/01/2019, 2:31 PM

## 2019-06-01 NOTE — Patient Instructions (Signed)
It was nice to talk to you today.  As we discussed it is time for another colonoscopy because of your family history of colon cancer and history of colon polyp in the past.   Since you have atrial fibrillation and take Eliquis to prevent stroke, we will clarify with Dr. Percival Spanish about holding that 2 days before your procedure to reduce the risk of bleeding from the procedure.   I appreciate the opportunity to care for you. Gatha Mayer, MD, [facility]

## 2019-06-01 NOTE — Progress Notes (Signed)
TELEHEALTH ENCOUNTER IN SETTING OF COVID-19 PANDEMIC - REQUESTED BY PATIENT SERVICE PROVIDED BY TELEMEDECINE - TYPE: Phone PATIENT LOCATION: Home PATIENT HAS CONSENTED TO TELEHEALTH VISIT PROVIDER LOCATION: OFFICE REFERRING PROVIDER: N/A PARTICIPANTS OTHER THAN PATIENT:none TIME SPENT ON CALL:15 mins   [name] 68 y.o. 09/30/1951 [id number]  Assessment & Plan:   Encounter Diagnoses  Name Primary?  Marland Kitchen Hx of adenomatous polyp of colon Yes  . Family hx of colon cancer mother at 28 and ? brother also   . Long term current use of anticoagulant - Eliquis   . Atrial fibrillation, unspecified type [facility])    Plan for colonoscopy off Eliquis x2 days.  I would think this would be acceptable but we will clarify with cardiology.  The risks and benefits as well as alternatives of endoscopic procedure(s) have been discussed and reviewed. All questions answered. The patient agrees to proceed. Rare but real risk of stroke off anticoagulation therapy explained and she understands and accepts that risk.  She also accepts the possibility of contracting COVID-19 illness by coming into our facility though we are doing what we can to mitigate that.  I appreciate the opportunity to care for this patient. CC: Sela Hilding, MD   Subjective:   Chief Complaint: History of colon polyp, family history of colon cancer, on Eliquis anticoagulation  HPI [name] is a 67 year old white woman that I follow with a history of an adenomatous colon polyp in 2005 and a family history of colon cancer in her mother, diagnosed at age 60.  A brother also had "intestinal cancer".  She reports that her GI tract other than occasional constipation is functioning well without signs or symptoms of problems in the GI review of systems.  She takes Eliquis 2 prevent stroke in the setting of atrial fibrillation and is followed by Dr. Percival Spanish.  Last year she had some syncope and then dizziness and some lightheadedness  symptoms and maybe some chest pain and had a negative stress evaluation.  She is not having any more those problems.  She has never had a stroke.CHA2DS2 - VASc score of 3 with a risk of stroke of 3.2% Allergies  Allergen Reactions  . Penicillins Itching    Has patient had a PCN reaction causing immediate rash, facial/tongue/throat swelling, SOB or lightheadedness with hypotension: Yes Has patient had a PCN reaction causing severe rash involving mucus membranes or skin necrosis: No Has patient had a PCN reaction that required hospitalization: No Has patient had a PCN reaction occurring within the last 10 years: Yes If all of the above answers are "NO", then may proceed with Cephalosporin use.   Marland Kitchen Percocet [Oxycodone-Acetaminophen] Itching   Current Meds  Medication Sig  . calcium carbonate (TUMS - DOSED IN MG ELEMENTAL CALCIUM) 500 MG chewable tablet Chew 1 tablet by mouth as needed for indigestion or heartburn.  . diclofenac sodium (VOLTAREN) 1 % GEL Apply 4 g topically 4 (four) times daily.  Marland Kitchen diltiazem (CARDIZEM CD) 240 MG 24 hr capsule TAKE 1 CAPSULE(300 MG) BY MOUTH DAILY  . ELIQUIS 5 MG TABS tablet TAKE 1 TABLET(5 MG) BY MOUTH TWICE DAILY  . gabapentin (NEURONTIN) 100 MG capsule Take 1 capsule (100 mg total) by mouth at bedtime.  Marland Kitchen glucose blood (ONETOUCH VERIO) test strip Use as instructed  . Lancet Devices (ONE TOUCH DELICA LANCING DEV) MISC 1 Units by Does not apply route 2 (two) times daily at 8 am and 10 pm.  . levothyroxine (SYNTHROID, LEVOTHROID)  100 MCG tablet Take 1 tablet (100 mcg total) by mouth daily.  Marland Kitchen lisinopril (PRINIVIL,ZESTRIL) 2.5 MG tablet Take 1 tablet (2.5 mg total) by mouth daily.  Marland Kitchen loratadine (CLARITIN) 10 MG tablet Take 10 mg by mouth daily as needed for allergies.  . metFORMIN (GLUCOPHAGE-XR) 500 MG 24 hr tablet Take 1 tablet (500 mg total) by mouth 2 (two) times daily.  Glory Rosebush DELICA LANCETS FINE MISC TEST TWICE DAILY( 8AM AND 10PM)  . rosuvastatin  (CRESTOR) 40 MG tablet TAKE 1 TABLET(40 MG) BY MOUTH DAILY   Past Medical History:  Diagnosis Date  . Anticoagulated on Coumadin   . Arthritis    wrist, hands -  . Atrial fibrillation, persistent   . Bladder tumor   . CAD (coronary artery disease) cardiologist-  dr hochrien   PTCA RCA 1999  . Cataract   . Diabetes ([place])   . Full dentures   . GERD (gastroesophageal reflux disease)   . History of colon polyps    2005  hyperplastic  . History of MI (myocardial infarction)    1999  . HLD (hyperlipidemia)   . Hypertension   . Hypothyroidism   . SUI (stress urinary incontinence, female)   . Wears glasses    Past Surgical History:  Procedure Laterality Date  . CARDIOVASCULAR STRESS TEST  05-01-2009   dr hochrein   normal nuclear study/  no ischemia or scar/  normal LV function and wall motion, ef 71%  . CORONARY ANGIOPLASTY  Feb 1999   PTCA to RCA  . D & C HYSTEROSCOPY/  POLYPECTOMY  05-08-2009  . HYSTEROSCOPY W/D&C  12/07/2012   Procedure: DILATATION AND CURETTAGE /HYSTEROSCOPY;  Surgeon: Elveria Royals, MD;  Location: [facility];  Service: Gynecology;  Laterality: N/A;  Dilitation and curettage /hysteroscopy/biopsey of lower segment of uterus  . TOTAL HIP ARTHROPLASTY Left 12-11-2010  . TRANSTHORACIC ECHOCARDIOGRAM  09-11-2015   mild LVH, ef 55%,  mildly reduced RVSF,  trivial TR  . TRANSURETHRAL RESECTION OF BLADDER TUMOR N/A 12/01/2015   Procedure: TRANSURETHRAL RESECTION OF BLADDER TUMOR (TURBT);  Surgeon: Kathie Rhodes, MD;  Location: [facility];  Service: Urology;  Laterality: N/A;  . TUBAL LIGATION  1974   Social History   Social History Narrative   Has retired from [employer].  Has 5 adult children all living in the area.      Emergency Contact: husband, Brexley Cutshaw, [phone]   End of Life Plan: discussed AD, gave pt pamphlet to fill out   Who lives with you: husband, one son, one great-grandson   Lives in one level house. No stairs. Smoke alarms,  has a grab-bar at the bath tub. No throw rugs on floor.   Diet: Pt has a varied diet of protein, starch and vegetables, fruits. Leans toward vegetable.   Exercise: Pt has no regular exercise routine. Does try to walk at least three times a week.   Seatbelts: Pt reports wearing seatbelt when in vehicles.    Hobbies: likes to play pool, likes to read and do crossword puzzles   family history includes Atrial fibrillation in her brother; Cancer in her brother, brother, brother, and sister; Colon cancer (age of onset: 84) in her mother; Diabetes in her mother; Heart disease in her father and mother.   Review of Systems Otherwise negative

## 2019-06-01 NOTE — Telephone Encounter (Signed)
[facility] Pre-operative Risk Assessment     Request for surgical clearance:     Endoscopy Procedure  What type of surgery is being performed?     colonoscopy  When is this surgery scheduled?     06/20/2019  What type of clearance is required ?   Pharmacy  Are there any medications that need to be held prior to surgery and how long? Eliquis  Practice name and name of physician performing surgery?      [facility]  What is your office phone and fax number?      Phone- [phone]  [phone]  Anesthesia type (None, local, MAC, general) ?       MAC

## 2019-06-03 ENCOUNTER — Other Ambulatory Visit: Payer: Self-pay | Admitting: Cardiology

## 2019-06-08 ENCOUNTER — Other Ambulatory Visit: Payer: Self-pay

## 2019-06-08 ENCOUNTER — Encounter: Payer: Self-pay | Admitting: Family Medicine

## 2019-06-08 ENCOUNTER — Ambulatory Visit (INDEPENDENT_AMBULATORY_CARE_PROVIDER_SITE_OTHER): Payer: Medicare Other | Admitting: Family Medicine

## 2019-06-08 VITALS — BP 124/72 | HR 73

## 2019-06-08 DIAGNOSIS — E119 Type 2 diabetes mellitus without complications: Secondary | ICD-10-CM

## 2019-06-08 LAB — POCT GLYCOSYLATED HEMOGLOBIN (HGB A1C): HbA1c, POC (controlled diabetic range): 6.1 % (ref 0.0–7.0)

## 2019-06-08 MED ORDER — ONETOUCH DELICA LANCING DEV MISC: 1.0000 [IU] | Freq: Two times a day (BID) | 1 refills | Status: DC

## 2019-06-08 NOTE — Progress Notes (Signed)
   CC: DM  HPI  DM - checks 2-3 times per week and symptomatic. No lows. Treats herself every once and a while. No metformin SEs. Not on ASA bc of eliquis. Exercise wise is going ok, not great. No alcohol. No smoking.   Knee is better than it was at last visit .   ROS: Denies CP, SOB, abdominal pain, dysuria, changes in BMs.   CC, SH/smoking status, and VS noted  Objective: BP 124/72   Pulse 73   LMP  (LMP Unknown)   SpO2 97%  Gen: NAD, alert, cooperative, and pleasant. HEENT: NCAT, EOMI, PERRL CV: RRR, no murmur Resp: CTAB, no wheezes, non-labored Ext: No edema, warm Neuro: Alert and oriented, Speech clear, No gross deficits  Assessment and plan:  Diabetes (HCC) Well-controlled on metformin.  Continue this.  Not on aspirin as she is also on Eliquis.  On a statin.  No tobacco or alcohol use.  Encouraged exercise as she is able as this will also help with her osteoarthritis.  Follow-up in 6 months.  Needs Ortho exam, which she will have faxed to [place].   Orders Placed This Encounter  Procedures  . HgB A1c    Meds ordered this encounter  Medications  . Lancet Devices (ONE TOUCH DELICA LANCING DEV) MISC    Sig: 1 Units by Does not apply route 2 (two) times daily at 8 am and 10 pm.    Dispense:  1 each    Refill:  1    Ralene Ok, MD, PGY3 06/08/2019 9:39 AM

## 2019-06-08 NOTE — Patient Instructions (Signed)
You are doing great. Exercise when you can, walking has good studies to show it help with osteoarthritis pain as it strengthens the small muscles which support your joints.   Great job with your diabetes!

## 2019-06-08 NOTE — Assessment & Plan Note (Signed)
Well-controlled on metformin.  Continue this.  Not on aspirin as she is also on Eliquis.  On a statin.  No tobacco or alcohol use.  Encouraged exercise as she is able as this will also help with her osteoarthritis.  Follow-up in 6 months.  Needs Ortho exam, which she will have faxed to [place].

## 2019-06-11 ENCOUNTER — Telehealth: Payer: Self-pay

## 2019-06-11 NOTE — Telephone Encounter (Signed)
Patient informed that per cardiology ok to hold the Eliquis for 24 hours prior to procedure. She verbalized understanding.

## 2019-06-12 DIAGNOSIS — G4733 Obstructive sleep apnea (adult) (pediatric): Secondary | ICD-10-CM | POA: Diagnosis not present

## 2019-06-19 ENCOUNTER — Telehealth: Payer: Self-pay | Admitting: Internal Medicine

## 2019-06-19 NOTE — Telephone Encounter (Signed)
Spoke w/patient and answered all covid-19 questions   Covid-19 Screening Questions: Do you now or have you had a fever in the last 14 days? NO Do you have any respiratory symptoms of shortness of breath or cough now or in the last 14 days? NO Do you have any family members or close contacts with diagnosed or suspected Covid-19 in the past 14 days? NO Have you been tested for Covid-19 and found to be positive? NO

## 2019-06-20 ENCOUNTER — Other Ambulatory Visit: Payer: Self-pay

## 2019-06-20 ENCOUNTER — Ambulatory Visit (AMBULATORY_SURGERY_CENTER): Payer: Medicare Other | Admitting: Internal Medicine

## 2019-06-20 ENCOUNTER — Encounter: Payer: Self-pay | Admitting: Internal Medicine

## 2019-06-20 VITALS — BP 101/68 | HR 75 | Temp 97.5°F | Resp 15 | Ht 63.0 in | Wt 180.0 lb

## 2019-06-20 DIAGNOSIS — D122 Benign neoplasm of ascending colon: Secondary | ICD-10-CM

## 2019-06-20 DIAGNOSIS — Z86010 Personal history of colonic polyps: Secondary | ICD-10-CM | POA: Diagnosis not present

## 2019-06-20 DIAGNOSIS — D123 Benign neoplasm of transverse colon: Secondary | ICD-10-CM | POA: Diagnosis not present

## 2019-06-20 DIAGNOSIS — Z800 Family history of malignant neoplasm of digestive organs: Secondary | ICD-10-CM

## 2019-06-20 NOTE — Op Note (Signed)
[place] Patient Name: [name] Procedure Date: 06/20/2019 8:48 AM MRN: [id number] Endoscopist: Gatha Mayer , MD Age: 68 Referring MD:  Date of Birth: May 17, 1951 Gender: Female Account #: [id number] Procedure:                Colonoscopy Indications:              Surveillance: Personal history of adenomatous                            polyps on last colonoscopy 5 years ago Medicines:                Propofol per Anesthesia, Monitored Anesthesia Care Procedure:                Pre-Anesthesia Assessment:                           - Prior to the procedure, a History and Physical                            was performed, and patient medications and                            allergies were reviewed. The patient's tolerance of                            previous anesthesia was also reviewed. The risks                            and benefits of the procedure and the sedation                            options and risks were discussed with the patient.                            All questions were answered, and informed consent                            was obtained. Prior Anticoagulants: The patient                            last took Eliquis (apixaban) 2 days prior to the                            procedure. ASA Grade Assessment: III - A patient                            with severe systemic disease. After reviewing the                            risks and benefits, the patient was deemed in                            satisfactory condition to undergo the procedure.  After obtaining informed consent, the colonoscope                            was passed under direct vision. Throughout the                            procedure, the patient's blood pressure, pulse, and                            oxygen saturations were monitored continuously. The                            Colonoscope was introduced through the anus and   advanced to the the cecum, identified by                            appendiceal orifice and ileocecal valve. The                            colonoscopy was performed without difficulty. The                            patient tolerated the procedure well. The quality                            of the bowel preparation was excellent. The bowel                            preparation used was Miralax via split dose                            instruction. Anatomical landmarks were photographed. Scope In: 9:06:25 AM Scope Out: 9:20:10 AM Scope Withdrawal Time: 0 hours 11 minutes 28 seconds  Total Procedure Duration: 0 hours 13 minutes 45 seconds  Findings:                 The perianal and digital rectal examinations were                            normal.                           A 5 mm polyp was found in the ascending colon. The                            polyp was sessile. The polyp was removed with a                            cold snare. Resection and retrieval were complete.                            Verification of patient identification for the                            specimen was  done. Estimated blood loss was minimal.                           The exam was otherwise without abnormality on                            direct and retroflexion views. Complications:            No immediate complications. Estimated Blood Loss:     Estimated blood loss was minimal. Impression:               - One 5 mm polyp in the ascending colon, removed                            with a cold snare. Resected and retrieved.                           - The examination was otherwise normal on direct                            and retroflexion views.                           - Personal history of colonic polyps. And FHx colon                            cancer Recommendation:           - Patient has a contact number available for                            emergencies. The signs and symptoms of potential                             delayed complications were discussed with the                            patient. Return to normal activities tomorrow.                            Written discharge instructions were provided to the                            patient.                           - Resume previous diet.                           - Continue present medications.                           - Resume Eliquis (apixaban) at prior dose tomorrow.                           - Repeat colonoscopy in 5 years for surveillance. Gatha Mayer, MD 06/20/2019 9:30:55 AM  This report has been signed electronically.

## 2019-06-20 NOTE — Progress Notes (Signed)
Called to room to assist during endoscopic procedure.  Patient ID and intended procedure confirmed with present staff. Received instructions for my participation in the procedure from the performing physician.  

## 2019-06-20 NOTE — Progress Notes (Signed)
A/ox3, pleased with MAC, report to RN 

## 2019-06-20 NOTE — Patient Instructions (Addendum)
I found and removed one tiny polyp.  We will likely do this again in 5 years.  Restart Eliquis tomorrow.  I appreciate the opportunity to care for you. Gatha Mayer, MD, [facility]   Please read handouts provided. Await pathology results.    YOU HAD AN ENDOSCOPIC PROCEDURE TODAY AT [facility]:   Refer to the procedure report that was given to you for any specific questions about what was found during the examination.  If the procedure report does not answer your questions, please call your gastroenterologist to clarify.  If you requested that your care partner not be given the details of your procedure findings, then the procedure report has been included in a sealed envelope for you to review at your convenience later.  YOU SHOULD EXPECT: Some feelings of bloating in the abdomen. Passage of more gas than usual.  Walking can help get rid of the air that was put into your GI tract during the procedure and reduce the bloating. If you had a lower endoscopy (such as a colonoscopy or flexible sigmoidoscopy) you may notice spotting of blood in your stool or on the toilet paper. If you underwent a bowel prep for your procedure, you may not have a normal bowel movement for a few days.  Please Note:  You might notice some irritation and congestion in your nose or some drainage.  This is from the oxygen used during your procedure.  There is no need for concern and it should clear up in a day or so.  SYMPTOMS TO REPORT IMMEDIATELY:   Following lower endoscopy (colonoscopy or flexible sigmoidoscopy):  Excessive amounts of blood in the stool  Significant tenderness or worsening of abdominal pains  Swelling of the abdomen that is new, acute  Fever of 100F or higher   For urgent or emergent issues, a gastroenterologist can be reached at any hour by calling [phone].   DIET:  We do recommend a small meal at first, but then you may proceed to your regular diet.   Drink plenty of fluids but you should avoid alcoholic beverages for 24 hours.  ACTIVITY:  You should plan to take it easy for the rest of today and you should NOT DRIVE or use heavy machinery until tomorrow (because of the sedation medicines used during the test).    FOLLOW UP: Our staff will call the number listed on your records 48-72 hours following your procedure to check on you and address any questions or concerns that you may have regarding the information given to you following your procedure. If we do not reach you, we will leave a message.  We will attempt to reach you two times.  During this call, we will ask if you have developed any symptoms of COVID 19. If you develop any symptoms (ie: fever, flu-like symptoms, shortness of breath, cough etc.) before then, please call [phone].  If you test positive for Covid 19 in the 2 weeks post procedure, please call and report this information to [place].    If any biopsies were taken you will be contacted by phone or by letter within the next 1-3 weeks.  Please call us at [phone] if you have not heard about the biopsies in 3 weeks.    SIGNATURES/CONFIDENTIALITY: You and/or your care partner have signed paperwork which will be entered into your electronic medical record.  These signatures attest to the fact that that the information above on your After Visit Summary  has been reviewed and is understood.  Full responsibility of the confidentiality of this discharge information lies with you and/or your care-partner.

## 2019-06-20 NOTE — Progress Notes (Signed)
Pt's states no medical or surgical changes since previsit or office visit. 

## 2019-06-22 ENCOUNTER — Telehealth: Payer: Self-pay

## 2019-06-22 DIAGNOSIS — G4733 Obstructive sleep apnea (adult) (pediatric): Secondary | ICD-10-CM | POA: Diagnosis not present

## 2019-06-22 NOTE — Telephone Encounter (Signed)
  Follow up Call-  Call back number 06/20/2019  Post procedure Call Back phone  # [phone]  Permission to leave phone message Yes  Some recent data might be hidden     Patient questions:  Do you have a fever, pain , or abdominal swelling? No. Pain Score  0 *  Have you tolerated food without any problems? Yes.    Have you been able to return to your normal activities? Yes.    Do you have any questions about your discharge instructions: Diet   no Medications  No. Follow up visit  No.  Do you have questions or concerns about your Care? No.  Actions: * If pain score is 4 or above: No action needed, pain <4. 1. Have you developed a fever since your procedure? no  2.   Have you had an respiratory symptoms (SOB or cough) since your procedure? no  3.   Have you tested positive for COVID 19 since your procedure no  4.   Have you had any family members/close contacts diagnosed with the COVID 19 since your procedure?  no   If yes to any of these questions please route to Joylene John, RN and Alphonsa Gin, Therapist, sports.

## 2019-06-25 ENCOUNTER — Encounter: Payer: Self-pay | Admitting: Internal Medicine

## 2019-06-25 NOTE — Progress Notes (Signed)
Diminutive adenoma Hx adenomas FHx CRCA  Recall 2025  My Chart

## 2019-07-01 ENCOUNTER — Other Ambulatory Visit: Payer: Self-pay | Admitting: Cardiology

## 2019-07-02 NOTE — Telephone Encounter (Signed)
Rx(s) sent to pharmacy electronically.  

## 2019-07-11 ENCOUNTER — Encounter: Payer: Self-pay | Admitting: Family Medicine

## 2019-07-11 ENCOUNTER — Other Ambulatory Visit: Payer: Self-pay

## 2019-07-11 ENCOUNTER — Ambulatory Visit (INDEPENDENT_AMBULATORY_CARE_PROVIDER_SITE_OTHER): Payer: Medicare Other | Admitting: Family Medicine

## 2019-07-11 VITALS — BP 104/60 | HR 79 | Wt 177.2 lb

## 2019-07-11 DIAGNOSIS — M25471 Effusion, right ankle: Secondary | ICD-10-CM | POA: Diagnosis not present

## 2019-07-11 DIAGNOSIS — M1711 Unilateral primary osteoarthritis, right knee: Secondary | ICD-10-CM | POA: Insufficient documentation

## 2019-07-11 MED ORDER — TRAMADOL HCL 50 MG PO TABS: 50.0000 mg | ORAL_TABLET | Freq: Two times a day (BID) | ORAL | 0 refills | Status: DC | PRN

## 2019-07-11 NOTE — Patient Instructions (Signed)
It was a pleasure meeting you!  1. Your knee pain is likely due to osteoarthritis. To treat this we will refer you to physical therapy, they will give you a call to schedule.  2. You will also receive a call from the sports medicine group to treat your knee pain.  3. I have prescribed you a few doses of Tramadol 50 mg. Use this drug sparingly for when your knee pain is very bad and Tylenol, ice, elevation, and diclofenac gel have not worked. This medication is to help you until you can see physical therapy and sports medicine. This medication can make you sleepy so do not drive if you have taken them.  4. Getting shoes with more support such as tennis shoes is recommended.  5. Consider wearing compression socks or stockings.  6. If you have any concerns or questions, don't hesitate to call or make an appointment.  Feel better!  Dr. Chauncey Reading   Osteoarthritis  Osteoarthritis is a type of arthritis that affects tissue that covers the ends of bones in joints (cartilage). Cartilage acts as a cushion between the bones and helps them move smoothly. Osteoarthritis results when cartilage in the joints gets worn down. Osteoarthritis is sometimes called "wear and tear" arthritis. Osteoarthritis is the most common form of arthritis. It often occurs in older people. It is a condition that gets worse over time (a progressive condition). Joints that are most often affected by this condition are in:  Fingers.  Toes.  Hips.  Knees.  Spine, including neck and lower back. What are the causes? This condition is caused by age-related wearing down of cartilage that covers the ends of bones. What increases the risk? The following factors may make you more likely to develop this condition:  Older age.  Being overweight or obese.  Overuse of joints, such as in athletes.  Past injury of a joint.  Past surgery on a joint.  Family history of osteoarthritis. What are the signs or symptoms? The  main symptoms of this condition are pain, swelling, and stiffness in the joint. The joint may lose its shape over time. Small pieces of bone or cartilage may break off and float inside of the joint, which may cause more pain and damage to the joint. Small deposits of bone (osteophytes) may grow on the edges of the joint. Other symptoms may include:  A grating or scraping feeling inside the joint when you move it.  Popping or creaking sounds when you move. Symptoms may affect one or more joints. Osteoarthritis in a major joint, such as your knee or hip, can make it painful to walk or exercise. If you have osteoarthritis in your hands, you might not be able to grip items, twist your hand, or control small movements of your hands and fingers (fine motor skills). How is this diagnosed? This condition may be diagnosed based on:  Your medical history.  A physical exam.  Your symptoms.  X-rays of the affected joint(s).  Blood tests to rule out other types of arthritis. How is this treated? There is no cure for this condition, but treatment can help to control pain and improve joint function. Treatment plans may include:  A prescribed exercise program that allows for rest and joint relief. You may work with a physical therapist.  A weight control plan.  Pain relief techniques, such as: ? Applying heat and cold to the joint. ? Electric pulses delivered to nerve endings under the skin (transcutaneous electrical nerve stimulation, or  TENS). ? Massage. ? Certain nutritional supplements.  NSAIDs or prescription medicines to help relieve pain.  Medicine to help relieve pain and inflammation (corticosteroids). This can be given by mouth (orally) or as an injection.  Assistive devices, such as a brace, wrap, splint, specialized glove, or cane.  Surgery, such as: ? An osteotomy. This is done to reposition the bones and relieve pain or to remove loose pieces of bone and cartilage. ? Joint  replacement surgery. You may need this surgery if you have very bad (advanced) osteoarthritis. Follow these instructions at home: Activity  Rest your affected joints as directed by your health care provider.  Do not drive or use heavy machinery while taking prescription pain medicine.  Exercise as directed. Your health care provider or physical therapist may recommend specific types of exercise, such as: ? Strengthening exercises. These are done to strengthen the muscles that support joints that are affected by arthritis. They can be performed with weights or with exercise bands to add resistance. ? Aerobic activities. These are exercises, such as brisk walking or water aerobics, that get your heart pumping. ? Range-of-motion activities. These keep your joints easy to move. ? Balance and agility exercises. Managing pain, stiffness, and swelling      If directed, apply heat to the affected area as often as told by your health care provider. Use the heat source that your health care provider recommends, such as a moist heat pack or a heating pad. ? If you have a removable assistive device, remove it as told by your health care provider. ? Place a towel between your skin and the heat source. If your health care provider tells you to keep the assistive device on while you apply heat, place a towel between the assistive device and the heat source. ? Leave the heat on for 20-30 minutes. ? Remove the heat if your skin turns bright red. This is especially important if you are unable to feel pain, heat, or cold. You may have a greater risk of getting burned.  If directed, put ice on the affected joint: ? If you have a removable assistive device, remove it as told by your health care provider. ? Put ice in a plastic bag. ? Place a towel between your skin and the bag. If your health care provider tells you to keep the assistive device on during icing, place a towel between the assistive device and  the bag. ? Leave the ice on for 20 minutes, 2-3 times a day. General instructions  Take over-the-counter and prescription medicines only as told by your health care provider.  Maintain a healthy weight. Follow instructions from your health care provider for weight control. These may include dietary restrictions.  Do not use any products that contain nicotine or tobacco, such as cigarettes and e-cigarettes. These can delay bone healing. If you need help quitting, ask your health care provider.  Use assistive devices as directed by your health care provider.  Keep all follow-up visits as told by your health care provider. This is important. Where to find more information  [employer] of Arthritis and Musculoskeletal and Skin Diseases: www.niams.SouthExposed.es  [employer] on Aging: http://kim-miller.com/  American College of Rheumatology: www.rheumatology.org Contact a health care provider if:  Your skin turns red.  You develop a rash.  You have pain that gets worse.  You have a fever along with joint or muscle aches. Get help right away if:  You lose a lot of weight.  You  suddenly lose your appetite.  You have night sweats. Summary  Osteoarthritis is a type of arthritis that affects tissue covering the ends of bones in joints (cartilage).  This condition is caused by age-related wearing down of cartilage that covers the ends of bones.  The main symptom of this condition is pain, swelling, and stiffness in the joint.  There is no cure for this condition, but treatment can help to control pain and improve joint function. This information is not intended to replace advice given to you by your health care provider. Make sure you discuss any questions you have with your health care provider. Document Released: 12/13/2005 Document Revised: 11/25/2017 Document Reviewed: 08/16/2016 Elsevier Patient Education  2020 [employer].

## 2019-07-11 NOTE — Assessment & Plan Note (Signed)
-   Remote h/o ankle injury - Swelling limited to ligamentous area anterior to lateral malleolus - Swelling worse with ambulation and as day continues, suggested compression socks, good shoes, elevating feet, and ice as needed.

## 2019-07-11 NOTE — Progress Notes (Signed)
Subjective:    Patient ID: [name], female    DOB: [birth year], 68 y.o.   MRN: [id number]   CC: R knee and ankle pain  HPI: In April Pt had a fall from standing while carrying an object and landed on R knee. Subsequent to the fall she noticed swelling in her R knee and ankle. Pt was seen at this office by Dr. Quinn Adkins and received an x-ray which showed bilateral osteoarthritis and no acute injury or fracture. Pt was diagnosed with likely effusion related to OA and was treated with diclofenac gel 1%, advised to ice, elevate, continue exercise, and take Tylenol prn for pain (pt on eliquis for A. Fib).   Pt returns today with similar complaint and states that swelling has continued and not improved. She states the pain and swelling is dull and aching in nature, constant from the moment she wakes up and worsens with ambulation. The pain sometimes keeps her from falling asleep at night. She notes that pain improves with elevation, rest, and minimal relief from diclofenac gel (about 30 mins) and tylenol (she takes 2 tabs 325 mg q4-6 hours). She has not iced her knee, used compression stockings, and frequently wears slip-on shoes with not much support. She has not fallen since. She notes that she is only able to ambulate about 50 ft and is limited by both pain and SOB (about the same as it always has been for her), she denies chest pain, dizziness, fainting, n/v.  Pt also states that she has noted that she sometimes has pain and swelling in her R ankle. The pain is not constant, not tingling or numb, a dull ache in nature. The pain is worsens with ambulation and at end of the day. This was not related to the fall nor any injury she is aware of. She has a remote history of a "bad sprain" to that ankle that needed splinting for multiple weeks.   Smoking status reviewed   ROS: pertinent noted in the HPI    Past medical history, surgical, family, and social history reviewed and updated in the  EMR as appropriate.  Objective:  BP 104/60   Pulse 79   Wt 177 lb 3.2 oz (80.4 kg)   LMP  (LMP Unknown)   SpO2 98%   BMI 31.39 kg/m   Vitals and nursing note reviewed  General: NAD, pleasant, able to participate in exam Cardiac: RRR, S1 S2 present. normal heart sounds, no murmurs. Respiratory: CTAB, normal effort, No wheezes, rales or rhonchi Extremities: LEs- no erythema or warmth present on bilateral knees, R knee swelling present.  Bilateral knees passive ROM intact, active ROM limited by pain in R knee. R patellar tenderness to palpation, instability, displaced medially and superiorly. Supra-patellar effusion present. Tenderness to palpation on R posterior fossa, no baker's cyst palpated. Minimal crepitus noted on R McMurray's test, none on L knee. Anterior Drawer (-) bilaterally. Strength 5/5, patellar reflex intact. R ankle: no erythema or warmth present at joint. Mild edema of anterior ligament of malleolus. Full ROM, 5/5 strength, achilles reflex intact, DP present. Skin: warm and dry, no rashes noted Neuro: alert, no obvious focal deficits Psych: Normal affect and mood   Assessment & Plan:   [name] is a 68 yo woman with suspected R knee effusion due to osteoarthritis  Osteoarthritis of right knee - X-ray from 04/2019 demonstrates medial and lateral moderate OA with osteophytes and joint space narrowing - Pt has continued sx and functional  restrictions due to pain of R knee  - Referral to Physical Therapy - Referral to Sports Medicine - Start Tramadol 50 mg, tab #10, take q12h prn for pain. Pt has used this medicine before without issue. Educated pt that this medicine is a bridge to therapy and treatment with PT and Sports medicine, she expresses understanding - Discussed importance of wearing good shoes with support to help alleviate pain and swelling.  Swollen R ankle - Remote h/o ankle injury - Swelling limited to ligamentous area anterior to lateral malleolus -  Swelling worse with ambulation and as day continues, suggested compression socks, good shoes, elevating feet, and ice as needed.  Sara Damme, MD [facility] PGY-1

## 2019-07-11 NOTE — Assessment & Plan Note (Addendum)
-   X-ray from 04/2019 demonstrates medial and lateral moderate OA with osteophytes and joint space narrowing - Pt has continued sx and functional restrictions due to pain of R knee  - Referral to Physical Therapy - Referral to Sports Medicine - Start Tramadol 50 mg, tab #10, take q12h prn for pain. Pt has used this medicine before without issue. Educated pt that this medicine is a bridge to therapy and treatment with PT and Sports medicine, she expresses understanding - Discussed importance of wearing good shoes with support to help alleviate pain and swelling.

## 2019-07-16 ENCOUNTER — Other Ambulatory Visit: Payer: Self-pay

## 2019-07-16 ENCOUNTER — Ambulatory Visit (INDEPENDENT_AMBULATORY_CARE_PROVIDER_SITE_OTHER): Payer: Medicare Other | Admitting: Family Medicine

## 2019-07-16 VITALS — BP 110/64 | Ht 63.0 in | Wt 177.0 lb

## 2019-07-16 DIAGNOSIS — M25561 Pain in right knee: Secondary | ICD-10-CM

## 2019-07-16 NOTE — Patient Instructions (Signed)
Your pain is due to arthritis. These are the different medications you can take for this: Tylenol 500mg  1-2 tabs three times a day for pain. Capsaicin, aspercreme, or biofreeze topically up to four times a day may also help with pain. Some supplements that may help for arthritis: Boswellia extract, curcumin, pycnogenol Aleve 1-2 tabs twice a day with food for pain and inflammation if your doctor said you can take anti-inflammatories - minimize your use of this if possible. Cortisone injections are an option - let me know if you want to do this. If cortisone injections do not help, there are different types of shots that may help but they take longer to take effect. It's important that you continue to stay active. Straight leg raises, knee extensions 3 sets of 10 once a day (add ankle weight if these become too easy). Start physical therapy to strengthen muscles around the joint that hurts to take pressure off of the joint itself. Shoe inserts with good arch support may be helpful. Heat or ice 15 minutes at a time 3-4 times a day as needed to help with pain. Water aerobics and cycling with low resistance are the best two types of exercise for arthritis though any exercise is ok as long as it doesn't worsen the pain. Follow up with me in 1 month.

## 2019-07-18 ENCOUNTER — Encounter: Payer: Self-pay | Admitting: Family Medicine

## 2019-07-18 NOTE — Progress Notes (Signed)
PCP: Benay Pike, MD  Subjective:   HPI: Patient is a 68 y.o. female here for right knee pain.  Patient reports for about 2 months she's had anterior right knee pain. She did have some pain after a fall in knee for a couple weeks. She's tried voltaren gel with a little benefit. Some associated swelling as well. Worse when getting up after prolonged sitting and at nighttime. No skin changes.  Past Medical History:  Diagnosis Date  . Anticoagulated on Coumadin   . Arthritis    wrist, hands -  . Atrial fibrillation, persistent   . Bladder tumor   . CAD (coronary artery disease) cardiologist-  dr hochrien   PTCA RCA 1999  . Cataract   . Diabetes ([place])   . Full dentures   . GERD (gastroesophageal reflux disease)   . History of colon polyps    2005  hyperplastic  . History of MI (myocardial infarction)    1999  . HLD (hyperlipidemia)   . Hypertension   . Hypothyroidism   . SUI (stress urinary incontinence, female)   . Wears glasses     Current Outpatient Medications on File Prior to Visit  Medication Sig Dispense Refill  . calcium carbonate (TUMS - DOSED IN MG ELEMENTAL CALCIUM) 500 MG chewable tablet Chew 1 tablet by mouth as needed for indigestion or heartburn.    . diclofenac sodium (VOLTAREN) 1 % GEL Apply 4 g topically 4 (four) times daily. 100 g 0  . diltiazem (CARDIZEM CD) 240 MG 24 hr capsule TAKE 1 CAPSULE BY MOUTH EVERY DAY 90 capsule 0  . ELIQUIS 5 MG TABS tablet TAKE 1 TABLET(5 MG) BY MOUTH TWICE DAILY 60 tablet 10  . gabapentin (NEURONTIN) 100 MG capsule Take 1 capsule (100 mg total) by mouth at bedtime. 90 capsule 0  . glucose blood (ONETOUCH VERIO) test strip Use as instructed 100 each 12  . Lancet Devices (ONE TOUCH DELICA LANCING DEV) MISC 1 Units by Does not apply route 2 (two) times daily at 8 am and 10 pm. 1 each 1  . levothyroxine (SYNTHROID, LEVOTHROID) 100 MCG tablet Take 1 tablet (100 mcg total) by mouth daily. 90 tablet 3  . lisinopril  (PRINIVIL,ZESTRIL) 2.5 MG tablet Take 1 tablet (2.5 mg total) by mouth daily. 90 tablet 3  . loratadine (CLARITIN) 10 MG tablet Take 10 mg by mouth daily as needed for allergies.    . metFORMIN (GLUCOPHAGE-XR) 500 MG 24 hr tablet Take 1 tablet (500 mg total) by mouth 2 (two) times daily. 180 tablet 3  . ONETOUCH DELICA LANCETS FINE MISC TEST TWICE DAILY( 8AM AND 10PM) 100 each 0  . rosuvastatin (CRESTOR) 40 MG tablet Take 1 tablet (40 mg total) by mouth daily. 90 tablet 1  . traMADol (ULTRAM) 50 MG tablet Take 1 tablet (50 mg total) by mouth every 12 (twelve) hours as needed. 10 tablet 0  . [DISCONTINUED] diltiazem (DILACOR XR) 120 MG 24 hr capsule Take 1 capsule (120 mg total) by mouth daily. 30 capsule 6   No current facility-administered medications on file prior to visit.     Past Surgical History:  Procedure Laterality Date  . CARDIOVASCULAR STRESS TEST  05-01-2009   dr hochrein   normal nuclear study/  no ischemia or scar/  normal LV function and wall motion, ef 71%  . COLONOSCOPY     multiple  . CORONARY ANGIOPLASTY  Feb 1999   PTCA to RCA  . D & C HYSTEROSCOPY/  POLYPECTOMY  05-08-2009  . HYSTEROSCOPY W/D&C  12/07/2012   Procedure: DILATATION AND CURETTAGE /HYSTEROSCOPY;  Surgeon: Elveria Royals, MD;  Location: [facility];  Service: Gynecology;  Laterality: N/A;  Dilitation and curettage /hysteroscopy/biopsey of lower segment of uterus  . TOTAL HIP ARTHROPLASTY Left 12-11-2010  . TRANSTHORACIC ECHOCARDIOGRAM  09-11-2015   mild LVH, ef 55%,  mildly reduced RVSF,  trivial TR  . TRANSURETHRAL RESECTION OF BLADDER TUMOR N/A 12/01/2015   Procedure: TRANSURETHRAL RESECTION OF BLADDER TUMOR (TURBT);  Surgeon: Kathie Rhodes, MD;  Location: [facility];  Service: Urology;  Laterality: N/A;  . TUBAL LIGATION  1974    Allergies  Allergen Reactions  . Penicillins Itching    Has patient had a PCN reaction causing immediate rash, facial/tongue/throat swelling, SOB or  lightheadedness with hypotension: Yes Has patient had a PCN reaction causing severe rash involving mucus membranes or skin necrosis: No Has patient had a PCN reaction that required hospitalization: No Has patient had a PCN reaction occurring within the last 10 years: Yes If all of the above answers are "NO", then may proceed with Cephalosporin use.   Marland Kitchen Percocet [Oxycodone-Acetaminophen] Itching    Social History   Socioeconomic History  . Marital status: Married    Spouse name: Eddie Dibbles  . Number of children: 5  . Years of education: 35  . Highest education level: 12th grade  Occupational History  . Occupation: retired    Comment: Sport and exercise psychologist  Social Needs  . Financial resource strain: Not hard at all  . Food insecurity    Worry: Never true    Inability: Never true  . Transportation needs    Medical: No    Non-medical: No  Tobacco Use  . Smoking status: Former Smoker    Packs/day: 1.50    Years: 15.00    Pack years: 22.50    Types: Cigarettes    Quit date: 11/09/1981    Years since quitting: 37.7  . Smokeless tobacco: Never Used  . Tobacco comment: no plans to start  Substance and Sexual Activity  . Alcohol use: No  . Drug use: No  . Sexual activity: Yes    Birth control/protection: Post-menopausal  Lifestyle  . Physical activity    Days per week: 3 days    Minutes per session: 30 min  . Stress: Not at all  Relationships  . Social Herbalist on phone: More than three times a week    Gets together: Twice a week    Attends religious service: Never    Active member of club or organization: Yes    Attends meetings of clubs or organizations: More than 4 times per year    Relationship status: Married  . Intimate partner violence    Fear of current or ex partner: No    Emotionally abused: No    Physically abused: No    Forced sexual activity: No  Other Topics Concern  . Not on file  Social History Narrative   Has retired from [employer].  Has 5  adult children all living in the area.      Emergency Contact: husband, Moira Umholtz, [phone]   End of Life Plan: discussed AD, gave pt pamphlet to fill out   Who lives with you: husband, one son, one great-grandson   Lives in one level house. No stairs. Smoke alarms, has a grab-bar at the bath tub. No throw rugs on floor.   Diet: Pt has a  varied diet of protein, starch and vegetables, fruits. Leans toward vegetable.   Exercise: Pt has no regular exercise routine. Does try to walk at least three times a week.   Seatbelts: Pt reports wearing seatbelt when in vehicles.    Hobbies: likes to play pool, likes to read and do crossword puzzles    Family History  Problem Relation Age of Onset  . Diabetes Mother   . Heart disease Mother   . Colon cancer Mother 39  . Heart disease Father   . Cancer Sister        Lung  . Cancer Brother        intestinal  . Atrial fibrillation Brother   . Cancer Brother        throat ca  . Cancer Brother        bladder  . Breast cancer Neg Hx     BP 110/64   Ht 5\' 3"  (1.6 m)   Wt 177 lb (80.3 kg)   LMP  (LMP Unknown)   BMI 31.35 kg/m   Review of Systems: See HPI above.     Objective:  Physical Exam:  Gen: NAD, comfortable in exam room  Right knee: No gross deformity, ecchymoses, effusion. Mild TTP post patellar facets, anterior aspect of medial joint line. FROM with 5/5 strength flexion and extension. Negative ant/post drawers. Negative valgus/varus testing. Negative lachmans. Negative mcmurrays, apleys, patellar apprehension. NV intact distally.  Left knee: No deformity. FROM with 5/5 strength. No tenderness to palpation. NVI distally.   Assessment & Plan:  1. Right knee pain - consistent with flare of arthritis.  Discussed tylenol, topical medications, supplements, aleve.  Start physical therapy and do home exercises on days she doesn't go to therapy.  Heat or ice.  F/u in 1 month.

## 2019-07-19 ENCOUNTER — Other Ambulatory Visit: Payer: Self-pay

## 2019-07-19 ENCOUNTER — Telehealth (INDEPENDENT_AMBULATORY_CARE_PROVIDER_SITE_OTHER): Payer: Medicare Other | Admitting: Family Medicine

## 2019-07-19 DIAGNOSIS — J302 Other seasonal allergic rhinitis: Secondary | ICD-10-CM

## 2019-07-19 NOTE — Progress Notes (Signed)
[facility] Visit  Patient consented to have virtual visit. Method of visit: Telephone  Encounter participants: Patient: [name] - located at home Provider: Bonnita Hollow - located at office Others (if applicable): none  Chief Complaint: nasal congestion  HPI: Patient reports 3 months of nasal congestion and cough at night.  Acutely worsened over the past 3 days predominantly with nasal congestion.  Also associated with watery, itchy eyes.  Patient has history of allergies.  She is currently taking Claritin which has helped in the past.  Has also used nasal steroids in the past but is not currently using it now.  She denies any fevers, chills, dyspnea, sore throat, sick contacts.  ROS: per HPI  Pertinent PMHx: Seasonal allergies, diabetic nephropathy Exam:  Respiratory: Sounds congested on the phone, able to speak full sentences without having positive breath  Assessment/Plan: Seasonal allergies Patient symptoms are likely related to seasonal allergies.  Low risk for infection or COVID19 given fever, other infectious symptoms.  Recommend addition of nasal steroid to antihistamine therapy.  Patient also has chronic cough, this could possibly be merited things including uncontrolled GERD, lisinopril.  But given the other constellation of symptoms, would like to see if there is any improvement on the nasal steroid before pursuing this further.  Patient has fluticasone at home.  She says she did not need a prescription.  She can get more OTC fluticasone as needed. Return precautions discussed.   Time spent during visit with patient: 5 minutes

## 2019-07-20 DIAGNOSIS — M25461 Effusion, right knee: Secondary | ICD-10-CM | POA: Diagnosis not present

## 2019-07-20 DIAGNOSIS — R269 Unspecified abnormalities of gait and mobility: Secondary | ICD-10-CM | POA: Diagnosis not present

## 2019-07-20 DIAGNOSIS — M25661 Stiffness of right knee, not elsewhere classified: Secondary | ICD-10-CM | POA: Diagnosis not present

## 2019-07-20 DIAGNOSIS — M62551 Muscle wasting and atrophy, not elsewhere classified, right thigh: Secondary | ICD-10-CM | POA: Diagnosis not present

## 2019-07-20 DIAGNOSIS — M25561 Pain in right knee: Secondary | ICD-10-CM | POA: Diagnosis not present

## 2019-07-23 DIAGNOSIS — H52223 Regular astigmatism, bilateral: Secondary | ICD-10-CM | POA: Diagnosis not present

## 2019-07-23 DIAGNOSIS — Z961 Presence of intraocular lens: Secondary | ICD-10-CM | POA: Diagnosis not present

## 2019-07-23 DIAGNOSIS — H5212 Myopia, left eye: Secondary | ICD-10-CM | POA: Diagnosis not present

## 2019-07-23 DIAGNOSIS — H26493 Other secondary cataract, bilateral: Secondary | ICD-10-CM | POA: Diagnosis not present

## 2019-07-23 LAB — HM DIABETES EYE EXAM

## 2019-07-24 DIAGNOSIS — M62551 Muscle wasting and atrophy, not elsewhere classified, right thigh: Secondary | ICD-10-CM | POA: Diagnosis not present

## 2019-07-24 DIAGNOSIS — M25461 Effusion, right knee: Secondary | ICD-10-CM | POA: Diagnosis not present

## 2019-07-24 DIAGNOSIS — M25561 Pain in right knee: Secondary | ICD-10-CM | POA: Diagnosis not present

## 2019-07-24 DIAGNOSIS — M25661 Stiffness of right knee, not elsewhere classified: Secondary | ICD-10-CM | POA: Diagnosis not present

## 2019-07-24 DIAGNOSIS — R269 Unspecified abnormalities of gait and mobility: Secondary | ICD-10-CM | POA: Diagnosis not present

## 2019-07-27 DIAGNOSIS — M25561 Pain in right knee: Secondary | ICD-10-CM | POA: Diagnosis not present

## 2019-07-27 DIAGNOSIS — M62551 Muscle wasting and atrophy, not elsewhere classified, right thigh: Secondary | ICD-10-CM | POA: Diagnosis not present

## 2019-07-27 DIAGNOSIS — M25461 Effusion, right knee: Secondary | ICD-10-CM | POA: Diagnosis not present

## 2019-07-27 DIAGNOSIS — R269 Unspecified abnormalities of gait and mobility: Secondary | ICD-10-CM | POA: Diagnosis not present

## 2019-07-27 DIAGNOSIS — M25661 Stiffness of right knee, not elsewhere classified: Secondary | ICD-10-CM | POA: Diagnosis not present

## 2019-07-30 ENCOUNTER — Other Ambulatory Visit: Payer: Self-pay | Admitting: Cardiology

## 2019-07-30 NOTE — Telephone Encounter (Signed)
29f 80.3kg Scr 1.20 (02/26/19) Lovw/hochrein 11/27/18

## 2019-08-14 ENCOUNTER — Other Ambulatory Visit: Payer: Self-pay

## 2019-08-14 MED ORDER — GABAPENTIN 100 MG PO CAPS: 100.0000 mg | ORAL_CAPSULE | Freq: Every day | ORAL | 0 refills | Status: DC

## 2019-08-20 ENCOUNTER — Ambulatory Visit (INDEPENDENT_AMBULATORY_CARE_PROVIDER_SITE_OTHER): Payer: Medicare Other | Admitting: Family Medicine

## 2019-08-20 ENCOUNTER — Encounter: Payer: Self-pay | Admitting: Family Medicine

## 2019-08-20 ENCOUNTER — Other Ambulatory Visit: Payer: Self-pay

## 2019-08-20 VITALS — BP 114/60 | Wt 177.0 lb

## 2019-08-20 DIAGNOSIS — M25561 Pain in right knee: Secondary | ICD-10-CM | POA: Diagnosis not present

## 2019-08-20 NOTE — Progress Notes (Signed)
PCP: Benay Pike, MD  Subjective:   HPI: Patient is a 68 y.o. female here for osteoarthritis. She has been doing physical therapy and having significant improvement in her symptoms. She mentions doing her daily knee exercises as instructed. She mentions having up to 7/10 pain if she remains standing for >30 minutes but she has no difficulty with walking, much improved She manages these acute episodes with intermittent use of Tylenol (~600mg ), Ice and rest. She does not take any NSAIDs. She does have Voltaren gel at home but she does not use it. She denies any fevers, chills, no redness, warmth or worsening swelling.    Past Medical History:  Diagnosis Date  . Anticoagulated on Coumadin   . Arthritis    wrist, hands -  . Atrial fibrillation, persistent   . Bladder tumor   . CAD (coronary artery disease) cardiologist-  dr hochrien   PTCA RCA 1999  . Cataract   . Diabetes ([place])   . Full dentures   . GERD (gastroesophageal reflux disease)   . History of colon polyps    2005  hyperplastic  . History of MI (myocardial infarction)    1999  . HLD (hyperlipidemia)   . Hypertension   . Hypothyroidism   . SUI (stress urinary incontinence, female)   . Wears glasses     Current Outpatient Medications on File Prior to Visit  Medication Sig Dispense Refill  . calcium carbonate (TUMS - DOSED IN MG ELEMENTAL CALCIUM) 500 MG chewable tablet Chew 1 tablet by mouth as needed for indigestion or heartburn.    . diclofenac sodium (VOLTAREN) 1 % GEL Apply 4 g topically 4 (four) times daily. 100 g 0  . diltiazem (CARDIZEM CD) 240 MG 24 hr capsule TAKE 1 CAPSULE BY MOUTH EVERY DAY 90 capsule 0  . ELIQUIS 5 MG TABS tablet TAKE 1 TABLET(5 MG) BY MOUTH TWICE DAILY 60 tablet 10  . gabapentin (NEURONTIN) 100 MG capsule Take 1 capsule (100 mg total) by mouth at bedtime. 90 capsule 0  . glucose blood (ONETOUCH VERIO) test strip Use as instructed 100 each 12  . Lancet Devices (ONE TOUCH DELICA LANCING DEV)  MISC 1 Units by Does not apply route 2 (two) times daily at 8 am and 10 pm. 1 each 1  . levothyroxine (SYNTHROID, LEVOTHROID) 100 MCG tablet Take 1 tablet (100 mcg total) by mouth daily. 90 tablet 3  . lisinopril (PRINIVIL,ZESTRIL) 2.5 MG tablet Take 1 tablet (2.5 mg total) by mouth daily. 90 tablet 3  . loratadine (CLARITIN) 10 MG tablet Take 10 mg by mouth daily as needed for allergies.    . metFORMIN (GLUCOPHAGE-XR) 500 MG 24 hr tablet Take 1 tablet (500 mg total) by mouth 2 (two) times daily. 180 tablet 3  . ONETOUCH DELICA LANCETS FINE MISC TEST TWICE DAILY( 8AM AND 10PM) 100 each 0  . rosuvastatin (CRESTOR) 40 MG tablet Take 1 tablet (40 mg total) by mouth daily. 90 tablet 1  . traMADol (ULTRAM) 50 MG tablet Take 1 tablet (50 mg total) by mouth every 12 (twelve) hours as needed. 10 tablet 0  . [DISCONTINUED] diltiazem (DILACOR XR) 120 MG 24 hr capsule Take 1 capsule (120 mg total) by mouth daily. 30 capsule 6   No current facility-administered medications on file prior to visit.     Past Surgical History:  Procedure Laterality Date  . CARDIOVASCULAR STRESS TEST  05-01-2009   dr hochrein   normal nuclear study/  no ischemia or  scar/  normal LV function and wall motion, ef 71%  . COLONOSCOPY     multiple  . CORONARY ANGIOPLASTY  Feb 1999   PTCA to RCA  . D & C HYSTEROSCOPY/  POLYPECTOMY  05-08-2009  . HYSTEROSCOPY W/D&C  12/07/2012   Procedure: DILATATION AND CURETTAGE /HYSTEROSCOPY;  Surgeon: Elveria Royals, MD;  Location: [facility];  Service: Gynecology;  Laterality: N/A;  Dilitation and curettage /hysteroscopy/biopsey of lower segment of uterus  . TOTAL HIP ARTHROPLASTY Left 12-11-2010  . TRANSTHORACIC ECHOCARDIOGRAM  09-11-2015   mild LVH, ef 55%,  mildly reduced RVSF,  trivial TR  . TRANSURETHRAL RESECTION OF BLADDER TUMOR N/A 12/01/2015   Procedure: TRANSURETHRAL RESECTION OF BLADDER TUMOR (TURBT);  Surgeon: Kathie Rhodes, MD;  Location: [facility];  Service:  Urology;  Laterality: N/A;  . TUBAL LIGATION  1974    Allergies  Allergen Reactions  . Penicillins Itching    Has patient had a PCN reaction causing immediate rash, facial/tongue/throat swelling, SOB or lightheadedness with hypotension: Yes Has patient had a PCN reaction causing severe rash involving mucus membranes or skin necrosis: No Has patient had a PCN reaction that required hospitalization: No Has patient had a PCN reaction occurring within the last 10 years: Yes If all of the above answers are "NO", then may proceed with Cephalosporin use.   Marland Kitchen Percocet [Oxycodone-Acetaminophen] Itching    Social History   Socioeconomic History  . Marital status: Married    Spouse name: Eddie Dibbles  . Number of children: 5  . Years of education: 27  . Highest education level: 12th grade  Occupational History  . Occupation: retired    Comment: Sport and exercise psychologist  Social Needs  . Financial resource strain: Not hard at all  . Food insecurity    Worry: Never true    Inability: Never true  . Transportation needs    Medical: No    Non-medical: No  Tobacco Use  . Smoking status: Former Smoker    Packs/day: 1.50    Years: 15.00    Pack years: 22.50    Types: Cigarettes    Quit date: 11/09/1981    Years since quitting: 37.8  . Smokeless tobacco: Never Used  . Tobacco comment: no plans to start  Substance and Sexual Activity  . Alcohol use: No  . Drug use: No  . Sexual activity: Yes    Birth control/protection: Post-menopausal  Lifestyle  . Physical activity    Days per week: 3 days    Minutes per session: 30 min  . Stress: Not at all  Relationships  . Social Herbalist on phone: More than three times a week    Gets together: Twice a week    Attends religious service: Never    Active member of club or organization: Yes    Attends meetings of clubs or organizations: More than 4 times per year    Relationship status: Married  . Intimate partner violence    Fear of current or ex  partner: No    Emotionally abused: No    Physically abused: No    Forced sexual activity: No  Other Topics Concern  . Not on file  Social History Narrative   Has retired from [employer].  Has 5 adult children all living in the area.      Emergency Contact: husband, Glennice Marcos, [phone]   End of Life Plan: discussed AD, gave pt pamphlet to fill out   Who  lives with you: husband, one son, one great-grandson   Lives in one level house. No stairs. Smoke alarms, has a grab-bar at the bath tub. No throw rugs on floor.   Diet: Pt has a varied diet of protein, starch and vegetables, fruits. Leans toward vegetable.   Exercise: Pt has no regular exercise routine. Does try to walk at least three times a week.   Seatbelts: Pt reports wearing seatbelt when in vehicles.    Hobbies: likes to play pool, likes to read and do crossword puzzles    Family History  Problem Relation Age of Onset  . Diabetes Mother   . Heart disease Mother   . Colon cancer Mother 66  . Heart disease Father   . Cancer Sister        Lung  . Cancer Brother        intestinal  . Atrial fibrillation Brother   . Cancer Brother        throat ca  . Cancer Brother        bladder  . Breast cancer Neg Hx     Wt 177 lb (80.3 kg)   LMP  (LMP Unknown)   BMI 31.35 kg/m   Review of Systems: See HPI above.     Objective:  Physical Exam:  Gen: NAD, comfortable in exam room  Right knee Mild swelling on medial aspect of patellar without erythema, warmth or tenderness. Active and passive range of motion intact. No crepitus No tenderness to palpation. Strength 5/5 on flexion and extension. Negative anterior/posterior drawer. Negative Lachmann's. Negative McMurray's. Negative valgus/varus.  Assessment & Plan:  1. Right knee osteoarthritis - Significant improvement with physical therapy - Continuing supportive care with ice and tylenol for exacerbations - Discussed likeliness of chronicity with occasional  exacerbations and treatment option of steroid injection if worsening pain - C/w physical therapy

## 2019-08-20 NOTE — Patient Instructions (Signed)
Your pain is due to arthritis. These are the different medications you can take for this: Tylenol 500mg  1-2 tabs three times a day for pain if needed. Capsaicin, aspercreme, or biofreeze topically up to four times a day may also help with pain. Some supplements that may help for arthritis: Boswellia extract, curcumin, pycnogenol You can continue physical therapy if you want or go for 1 more visit and transition just to a home exercise program. Call me if you need anything otherwise follow up as needed.

## 2019-08-31 ENCOUNTER — Other Ambulatory Visit: Payer: Self-pay | Admitting: Cardiology

## 2019-08-31 NOTE — Telephone Encounter (Signed)
Please advise on dosage to refill. Pharmacy requesting 240mg  but chart notes list 300mg  daily. Thank you!

## 2019-09-07 ENCOUNTER — Other Ambulatory Visit: Payer: Self-pay | Admitting: Cardiology

## 2019-09-07 MED ORDER — DILTIAZEM HCL ER COATED BEADS 240 MG PO CP24: ORAL_CAPSULE | ORAL | 1 refills | Status: DC

## 2019-09-07 NOTE — Telephone Encounter (Signed)
Ne message   *STAT* If patient is at the pharmacy, call can be transferred to refill team.   1. Which medications need to be refilled? (please list name of each medication and dose if known) diltiazem (CARDIZEM CD) 240 MG 24 hr capsule  2. Which pharmacy/location (including street and city if local pharmacy) is medication to be sent to?[place], [place] - [address] AT [place]  3. Do they need a 30 day or 90 day supply?[place]

## 2019-09-10 ENCOUNTER — Other Ambulatory Visit: Payer: Self-pay

## 2019-09-10 MED ORDER — DILTIAZEM HCL ER COATED BEADS 240 MG PO CP24: ORAL_CAPSULE | ORAL | 1 refills | Status: DC

## 2019-09-21 ENCOUNTER — Encounter: Payer: Self-pay | Admitting: Family Medicine

## 2019-09-21 ENCOUNTER — Ambulatory Visit (INDEPENDENT_AMBULATORY_CARE_PROVIDER_SITE_OTHER): Payer: Medicare Other | Admitting: Family Medicine

## 2019-09-21 ENCOUNTER — Other Ambulatory Visit: Payer: Self-pay

## 2019-09-21 VITALS — BP 108/62 | HR 81 | Ht 63.0 in | Wt 178.6 lb

## 2019-09-21 DIAGNOSIS — G4733 Obstructive sleep apnea (adult) (pediatric): Secondary | ICD-10-CM

## 2019-09-21 DIAGNOSIS — E119 Type 2 diabetes mellitus without complications: Secondary | ICD-10-CM | POA: Diagnosis not present

## 2019-09-21 LAB — POCT GLYCOSYLATED HEMOGLOBIN (HGB A1C): HbA1c, POC (controlled diabetic range): 6 % (ref 0.0–7.0)

## 2019-09-21 NOTE — Progress Notes (Signed)
   [facility] Phone: [phone]     [name] - 68 y.o. female MRN [id number]  Date of birth: [birth year]  Subjective:   cc: sleep apnea  HPI:  Sleep apnea: she was told by the people who send her her cpap equipment she needed to see a physician every so often.  She uses the machine almost every night.  She hasn't used it in the past few nights because her sinuses are 'messed up'.  She has more energy when she uses it.  No difficulty with using the machine.  She gets replacement parts as needed.  No AM headaches.  No nocturnal dyspnea.  She has bee nusing it for about two years.    Diabetes:  She needs a new meter.  She uses a meter twice a day.  She takes metformin twice a day.  Has never had hypoglycemic episodes.  Does not take insulin.   OPhthalmologist:  Laser surgery for cataracts is scheduled soon.  He is also looking at her diabetic retinopathy.    ROS: See HPI for pertinent positives and negatives  Past Medical History  Family history reviewed for today's visit. No changes.  Social history- patient is a former smoker   Objective:   BP 108/62   Pulse 81   Ht 5\' 3"  (1.6 m)   Wt 178 lb 9.6 oz (81 kg)   LMP  (LMP Unknown)   SpO2 97%   BMI 31.64 kg/m  Gen: NAD, alert and oriented, cooperative with exam CV: normal rate, regular rhythm. No murmurs, no rubs.  Resp: LCTAB, no wheezes, crackles. normal work of breathing Psych: Appropriate behavior  Assessment/Plan:   OSA (obstructive sleep apnea) Was told she needed to have appt with her pcp by the people who prescribe her cpap.  Patient is compliant, tolerating well, notices improvement in symptoms with use.   - continue cpap nightly  Diabetes ([place]) Most recent a1c's have been < 6.5.  Taking metformin BID.  Informed pt she does not need to check her blood glucose if not taking medications which can cuase hypoglycemia.  She is okay with not checking her blood glucose anymore.sees  ophthalmologist  regularly.  States she was recently checked for diabetic retinopathy.  Will need to get her records to document in epic.    [name], MD PGY-2 [facility]

## 2019-09-21 NOTE — Patient Instructions (Signed)
Your diabetes is very well controlled.  You do not need to take your blood sugar anymore at home.  We can check it every six months here.

## 2019-09-22 NOTE — Assessment & Plan Note (Addendum)
Most recent a1c's have been < 6.5.  Taking metformin BID.  Informed pt she does not need to check her blood glucose if not taking medications which can cuase hypoglycemia.  She is okay with not checking her blood glucose anymore.sees ophthalmologist  regularly.  States she was recently checked for diabetic retinopathy.  Will need to get her records to document in epic.

## 2019-09-22 NOTE — Assessment & Plan Note (Signed)
Was told she needed to have appt with her pcp by the people who prescribe her cpap.  Patient is compliant, tolerating well, notices improvement in symptoms with use.   - continue cpap nightly

## 2019-10-11 ENCOUNTER — Other Ambulatory Visit: Payer: Self-pay

## 2019-10-11 ENCOUNTER — Ambulatory Visit (INDEPENDENT_AMBULATORY_CARE_PROVIDER_SITE_OTHER): Payer: Medicare Other | Admitting: Family Medicine

## 2019-10-11 VITALS — BP 104/60 | HR 65 | Ht 64.0 in | Wt 179.6 lb

## 2019-10-11 DIAGNOSIS — M1711 Unilateral primary osteoarthritis, right knee: Secondary | ICD-10-CM

## 2019-10-11 MED ORDER — TRAMADOL HCL 50 MG PO TABS: 50.0000 mg | ORAL_TABLET | Freq: Every evening | ORAL | 0 refills | Status: DC | PRN

## 2019-10-11 NOTE — Patient Instructions (Signed)
It was a pleasure to see you today! Thank you for choosing [facility] for your primary care. [name] was seen for chronic right knee pain. Come back to the clinic if there is anything we can do for you.   Today we talked about your chronic osteoarthritis in your right knee.  I do agree that it is reasonable for you to speak to a surgeon so we can send you back to the surgeon who did your hip.  We will put in a referral for you to go see Dr. French Ana over at [facility] orthopedics.  If you do not hear from them in the next week please call over and ask them about scheduling an appointment.  We also gave you a few tramadol pills to help deal with the pain she can sleep at night until you see Dr. French Ana.  I want to be clear this is not a long-term medication.  We also did a diabetic foot exam today which he passed.  Please go get your annual eye exam and get the results back to [place] so that we can keep them in your chart.   Please bring all your medications to every doctors visit   Sign up for My Chart to have easy access to your labs results, and communication with your Primary care physician.     Please check-out at the front desk before leaving the clinic.     Best,  Dr. Sherene Sires FAMILY MEDICINE RESIDENT - PGY3 10/11/2019 2:33 PM

## 2019-10-12 NOTE — Progress Notes (Signed)
    Subjective:  [name] is a 68 y.o. female who presents to the [facility] today with a chief complaint of right knee pain.   HPI: Osteoarthritis of right knee Patient with worsening osteoarthritis of her knee.  She said she has had no acute changes and no new injuries but that the pain is beginning to inhibit her ability to exercise.  She said that she had a good experience with Dr. French Ana for a hip replacement in the past and would like to go back to see him.  She also request increased pain medicine in the meantime.  Objective:  Physical Exam: BP 104/60   Pulse 65   Ht 5\' 4"  (1.626 m)   Wt 179 lb 9.6 oz (81.5 kg)   LMP  (LMP Unknown)   SpO2 98%   BMI 30.83 kg/m   Gen: NAD, conversing comfortably CV: RRR with no murmurs appreciated Pulm: NWOB, CTAB with no crackles, wheezes, or rhonchi GI: Normal bowel sounds present. Soft, Nontender, Nondistended. MSK: Crepitus to both knees, particular right.  Patient expresses more pain with flexion and extension to the right.  Patient is able to ambulate and there is no sign of effusion or warmth to the knee at this time.  No distal deficits Skin: warm, dry Neuro: grossly normal, moves all extremities Psych: Normal affect and thought content  No results found for this or any previous visit (from the past 72 hour(s)).   Assessment/Plan:  Osteoarthritis of right knee Patient with worsening osteoarthritis of her knee.  She said she has had no acute changes and no new injuries but that the pain is beginning to inhibit her ability to exercise.  She said that she had a good experience with Dr. French Ana for a hip replacement in the past and would like to go back to see him.  She also request increased pain medicine in the meantime.  No sign of infection on knee, there is significant crepitus only mild swelling consistent with osteoarthritis.  Will refer to Dr. French Ana at her request as well as give low-dose tramadol which should allow her enough  time to get to this appointment.  We discussed firmly that this would not be a commitment to long-term prescription of tramadol.  PDMP was reviewed   Sherene Sires, [place] - PGY3 10/12/2019 9:39 PM

## 2019-10-12 NOTE — Assessment & Plan Note (Addendum)
Patient with worsening osteoarthritis of her knee.  She said she has had no acute changes and no new injuries but that the pain is beginning to inhibit her ability to exercise.  She said that she had a good experience with Dr. French Ana for a hip replacement in the past and would like to go back to see him.  She also request increased pain medicine in the meantime.  No sign of infection on knee, there is significant crepitus only mild swelling consistent with osteoarthritis.  Will refer to Dr. French Ana at her request as well as give low-dose tramadol which should allow her enough time to get to this appointment.  We discussed firmly that this would not be a commitment to long-term prescription of tramadol.  PDMP was reviewed

## 2019-10-15 ENCOUNTER — Encounter (HOSPITAL_COMMUNITY): Payer: Self-pay

## 2019-10-15 ENCOUNTER — Ambulatory Visit (INDEPENDENT_AMBULATORY_CARE_PROVIDER_SITE_OTHER): Payer: Medicare Other

## 2019-10-15 ENCOUNTER — Other Ambulatory Visit: Payer: Self-pay

## 2019-10-15 ENCOUNTER — Ambulatory Visit (HOSPITAL_COMMUNITY)
Admission: EM | Admit: 2019-10-15 | Discharge: 2019-10-15 | Disposition: A | Discharge status: Home / Self Care | Payer: Medicare Other | Attending: Family Medicine | Admitting: Family Medicine

## 2019-10-15 DIAGNOSIS — S62347A Nondisplaced fracture of base of fifth metacarpal bone. left hand, initial encounter for closed fracture: Secondary | ICD-10-CM

## 2019-10-15 DIAGNOSIS — S52125A Nondisplaced fracture of head of left radius, initial encounter for closed fracture: Secondary | ICD-10-CM

## 2019-10-15 MED ORDER — HYDROCODONE-ACETAMINOPHEN 5-325 MG PO TABS: 1.0000 | ORAL_TABLET | Freq: Four times a day (QID) | ORAL | 0 refills | Status: DC | PRN

## 2019-10-15 NOTE — Progress Notes (Signed)
Orthopedic Tech Progress Note Patient Details:  [name] [birth year] [id number]  Ortho Devices Type of Ortho Device: Long arm splint, Arm sling Ortho Device/Splint Interventions: Adjustment, Application, Ordered   Post Interventions Patient Tolerated: Well Instructions Provided: Care of device, Adjustment of device   [name] 10/15/2019, 8:00 PM

## 2019-10-15 NOTE — Discharge Instructions (Addendum)
Wear brace and sling until you see specialist Call [facility], Dr. Shelbie Ammons office tomorrow to be seen later this week Take pain medication as needed, if you experience itching take a Benadryl Do not drive until cleared by orthopedist

## 2019-10-15 NOTE — ED Provider Notes (Signed)
[place]    CSN: [id number] Arrival date & time: 10/15/19  1633      History   Chief Complaint Chief Complaint  Patient presents with  . Hand Injury  . Wrist Injury  . Elbow Injury    HPI [name] is a 68 y.o. female.   HPI  Patient lost her balance and fell today.  She fell on her left arm.  Thinks it was her outstretched hand.  She is certain she did not faint.  She is here for left arm pain.  Hurts from her hand all the way up to her elbow.  Pain with finger movement, wrist movement, elbow movement.  She has noticed some swelling and bruising in her hand.  No prior fractures in this arm.  She does not think she has osteoporosis.  Past Medical History:  Diagnosis Date  . Anticoagulated on Coumadin   . Arthritis    wrist, hands -  . Atrial fibrillation, persistent ([place])   . Bladder tumor   . CAD (coronary artery disease) cardiologist-  dr hochrien   PTCA RCA 1999  . Cataract   . Diabetes ([place])   . Full dentures   . GERD (gastroesophageal reflux disease)   . History of colon polyps    2005  hyperplastic  . History of MI (myocardial infarction)    1999  . HLD (hyperlipidemia)   . Hypertension   . Hypothyroidism   . SUI (stress urinary incontinence, female)   . Wears glasses     Patient Active Problem List   Diagnosis Date Noted  . Osteoarthritis of right knee 07/11/2019  . Swollen R ankle 07/11/2019  . Microalbuminuria 12/07/2018  . OSA (obstructive sleep apnea) 03/16/2018  . History of bladder cancer 08/24/2017  . Claudication ([place]) 07/21/2017  . Onychomycosis of toenail 06/09/2017  . Long-term (current) use of anticoagulants 02/19/2015  . Diabetes ([place]) 04/11/2014  . Atrial fibrillation ([place]) 12/18/2013  . HLD (hyperlipidemia) 04/29/2009  . Obesity 04/29/2009  . Essential hypertension, benign 04/29/2009  . Coronary atherosclerosis 04/29/2009  . Hypothyroidism 02/23/2007  . MYOCARDIAL INFARCTION, OLD 02/23/2007  . Personal  history of colonic adenoma 11/11/2004    Past Surgical History:  Procedure Laterality Date  . CARDIOVASCULAR STRESS TEST  05-01-2009   dr hochrein   normal nuclear study/  no ischemia or scar/  normal LV function and wall motion, ef 71%  . COLONOSCOPY     multiple  . CORONARY ANGIOPLASTY  Feb 1999   PTCA to RCA  . D & C HYSTEROSCOPY/  POLYPECTOMY  05-08-2009  . HYSTEROSCOPY W/D&C  12/07/2012   Procedure: DILATATION AND CURETTAGE /HYSTEROSCOPY;  Surgeon: Elveria Royals, MD;  Location: [facility];  Service: Gynecology;  Laterality: N/A;  Dilitation and curettage /hysteroscopy/biopsey of lower segment of uterus  . TOTAL HIP ARTHROPLASTY Left 12-11-2010  . TRANSTHORACIC ECHOCARDIOGRAM  09-11-2015   mild LVH, ef 55%,  mildly reduced RVSF,  trivial TR  . TRANSURETHRAL RESECTION OF BLADDER TUMOR N/A 12/01/2015   Procedure: TRANSURETHRAL RESECTION OF BLADDER TUMOR (TURBT);  Surgeon: Kathie Rhodes, MD;  Location: [facility];  Service: Urology;  Laterality: N/A;  . TUBAL LIGATION  1974    OB History   No obstetric history on file.      Home Medications    Prior to Admission medications   Medication Sig Start Date End Date Taking? Authorizing Provider  calcium carbonate (TUMS - DOSED IN MG ELEMENTAL CALCIUM) 500 MG chewable  tablet Chew 1 tablet by mouth as needed for indigestion or heartburn.    [provider]  diclofenac sodium (VOLTAREN) 1 % GEL Apply 4 g topically 4 (four) times daily. 05/18/19   Glenis Smoker, MD  diltiazem (CARDIZEM CD) 240 MG 24 hr capsule TAKE 1 CAPSULE BY MOUTH EVERY DAY 09/10/19   Minus Breeding, MD  gabapentin (NEURONTIN) 100 MG capsule Take 1 capsule (100 mg total) by mouth at bedtime. 08/14/19   Benay Pike, MD  glucose blood [facility] VERIO) test strip Use as instructed 03/14/17   [place]is Smoker, MD  HYDROcodone-acetaminophen (NORCO/VICODIN) 5-325 MG tablet Take 1-2 tablets by mouth every 6 (six) hours as needed. 10/15/19    Raylene Everts, MD  Lancet Devices (ONE TOUCH DELICA LANCING DEV) MISC 1 Units by Does not apply route 2 (two) times daily at 8 am and 10 pm. 06/08/19   [place] Smoker, MD  levothyroxine (SYNTHROID, LEVOTHROID) 100 MCG tablet Take 1 tablet (100 mcg total) by mouth daily. 02/27/19   [place] Smoker, MD  lisinopril (PRINIVIL,ZESTRIL) 2.5 MG tablet Take 1 tablet (2.5 mg total) by mouth daily. 02/26/19   [place] Smoker, MD  loratadine (CLARITIN) 10 MG tablet Take 10 mg by mouth daily as needed for allergies.    [provider]  metFORMIN (GLUCOPHAGE-XR) 500 MG 24 hr tablet Take 1 tablet (500 mg total) by mouth 2 (two) times daily. 02/26/19   [place] Smoker, MD  [facility] DELICA LANCETS FINE MISC TEST TWICE DAILY( 8AM AND 10PM) 10/03/17   [place] Smoker, MD  rosuvastatin (CRESTOR) 40 MG tablet Take 1 tablet (40 mg total) by mouth daily. 07/02/19   Minus Breeding, MD  traMADol (ULTRAM) 50 MG tablet Take 1 tablet (50 mg total) by mouth at bedtime as needed for severe pain. 10/11/19   Sherene Sires, DO  diltiazem (DILACOR XR) 120 MG 24 hr capsule Take 1 capsule (120 mg total) by mouth daily. 09/15/11 05/15/12  Minus Breeding, MD  ELIQUIS 5 MG TABS tablet TAKE 1 TABLET(5 MG) BY MOUTH TWICE DAILY 07/30/19 10/15/19  Minus Breeding, MD    Family History Family History  Problem Relation Age of Onset  . Diabetes Mother   . Heart disease Mother   . Colon cancer Mother 66  . Heart disease Father   . Cancer Sister        Lung  . Cancer Brother        intestinal  . Atrial fibrillation Brother   . Cancer Brother        throat ca  . Cancer Brother        bladder  . Breast cancer Neg Hx     Social History Social History   Tobacco Use  . Smoking status: Former Smoker    Packs/day: 1.50    Years: 15.00    Pack years: 22.50    Types: Cigarettes    Quit date: 11/09/1981    Years since quitting: 37.9  . Smokeless tobacco: Never Used  . Tobacco comment: no plans to  start  Substance Use Topics  . Alcohol use: No  . Drug use: No     Allergies   Penicillins and Percocet [oxycodone-acetaminophen]   Review of Systems Review of Systems  Constitutional: Negative for chills and fever.  HENT: Negative for ear pain and sore throat.   Eyes: Negative for pain and visual disturbance.  Respiratory: Negative for cough and shortness of breath.   Cardiovascular: Negative  for chest pain and palpitations.  Gastrointestinal: Negative for abdominal pain and vomiting.  Genitourinary: Negative for dysuria and hematuria.  Musculoskeletal: Positive for arthralgias. Negative for back pain.  Skin: Negative for color change and rash.  Neurological: Negative for seizures and syncope.  All other systems reviewed and are negative.    Physical Exam Triage Vital Signs ED Triage Vitals  Enc Vitals Group     BP 10/15/19 1757 122/77     Pulse Rate 10/15/19 1757 79     Resp 10/15/19 1757 15     Temp 10/15/19 1757 98.6 F (37 C)     Temp src --      SpO2 10/15/19 1757 97 %     Weight --      Height --      Head Circumference --      Peak Flow --      Pain Score 10/15/19 1753 8     Pain Loc --      Pain Edu? --      Excl. in [place]? --    No data found.  Updated Vital Signs BP 122/77 (BP Location: Right Arm)   Pulse 79   Temp 98.6 F (37 C)   Resp 15   LMP  (LMP Unknown)   SpO2 97%       Physical Exam Constitutional:      General: She is not in acute distress.    Appearance: Normal appearance. She is well-developed.     Comments: Appears uncomfortable.  Overweight  HENT:     Head: Normocephalic and atraumatic.  Eyes:     Conjunctiva/sclera: Conjunctivae normal.     Pupils: Pupils are equal, round, and reactive to light.  Neck:     Musculoskeletal: Normal range of motion.  Cardiovascular:     Rate and Rhythm: Normal rate.  Pulmonary:     Effort: Pulmonary effort is normal. No respiratory distress.  Abdominal:     General: There is no  distension.     Palpations: Abdomen is soft.  Musculoskeletal: Normal range of motion.     Comments: Presents holding left arm close to the body.  Some visible bruising between the fourth and fifth metacarpals distally.  Tenderness over the fourth and fifth metacarpals distally.  Good range of motion of the fingers.  Patient can flex and extend the wrist but resists pronation supination.  Forearm looks normal.  No tenderness to palpation.  Some tenderness at the elbow lateral epicondyle and olecranon.  No visible ecchymosis.  Patient resists movement of the elbow.  Skin:    General: Skin is warm and dry.  Neurological:     General: No focal deficit present.     Mental Status: She is alert.  Psychiatric:        Mood and Affect: Mood normal.        Behavior: Behavior normal.      UC Treatments / Results  Labs (all labs ordered are listed, but only abnormal results are displayed) Labs Reviewed - No data to display  EKG   Radiology Dg Forearm Left  Result Date: 10/15/2019 CLINICAL DATA:  Fall, pain EXAM: LEFT FOREARM - 2 VIEW COMPARISON:  None. FINDINGS: Suspect a very subtle fracture of the lateral aspect of left radial head, best appreciated on frontal view. There is a large elbow joint effusion. No other fracture or dislocation of the left forearm. Soft tissues are unremarkable. IMPRESSION: Suspect a very subtle fracture of the lateral aspect of  left radial head, best appreciated on frontal view. There is a large elbow joint effusion. No other fracture or dislocation of the left forearm. Electronically Signed   By: Eddie Candle M.D.   On: 10/15/2019 18:50   Dg Hand Complete Left  Result Date: 10/15/2019 CLINICAL DATA:  Fall, pain EXAM: LEFT HAND - COMPLETE 3+ VIEW COMPARISON:  None. FINDINGS: There is a slightly angulated fracture of the most distal left fifth metacarpal. No other fracture or dislocation of the left hand. Mild osteoarthritic pattern degenerative change. Soft tissues  are unremarkable. IMPRESSION: There is a slightly angulated fracture of the most distal left fifth metacarpal. No other fracture or dislocation of the left hand. Electronically Signed   By: Eddie Candle M.D.   On: 10/15/2019 18:54    Procedures Procedures (including critical care time)  Medications Ordered in UC Medications - No data to display  Initial Impression / Assessment and Plan / UC Course  I have reviewed the triage vital signs and the nursing notes.  Pertinent labs & imaging results that were available during my care of the patient were reviewed by me and considered in my medical decision making (see chart for details).     I called and discussed the injuries with Dr. Jeannie Fend on-call for hand injuries.  He recommends bracing, and follow-up with him this week. Final Clinical Impressions(s) / UC Diagnoses   Final diagnoses:  Nondisplaced fracture of base of fifth metacarpal bone, left hand, initial encounter for closed fracture  Closed nondisplaced fracture of head of left radius, initial encounter     Discharge Instructions     Wear brace and sling until you see specialist [facility], Dr. Shelbie Ammons office tomorrow to be seen later this week Take pain medication as needed, if you experience itching take a Benadryl Do not drive until cleared by orthopedist    ED Prescriptions    Medication Sig Dispense Auth. Provider   HYDROcodone-acetaminophen (NORCO/VICODIN) 5-325 MG tablet Take 1-2 tablets by mouth every 6 (six) hours as needed. 12 tablet Raylene Everts, MD     I have reviewed the PDMP during this encounter.   Raylene Everts, MD 10/15/19 2132

## 2019-10-15 NOTE — ED Notes (Signed)
[employer] aware of need for splint

## 2019-10-15 NOTE — ED Triage Notes (Signed)
Pt states she tripped and fell today over her left arm. Pt report she is having left ahnd pain, left wrist pain and left elbow pain.

## 2019-11-25 DIAGNOSIS — E119 Type 2 diabetes mellitus without complications: Secondary | ICD-10-CM | POA: Insufficient documentation

## 2019-11-25 DIAGNOSIS — E785 Hyperlipidemia, unspecified: Secondary | ICD-10-CM | POA: Insufficient documentation

## 2019-11-25 NOTE — Progress Notes (Signed)
Cardiology Office Note   Date:  11/27/2019   ID:  [name], DOB 1951/11/26, MRN [id number]  PCP:  [name]  Cardiologist:   [name]   Chief Complaint  Patient presents with  . Atrial Fibrillation      History of Present Illness: [name] is a 68 y.o. female who presents for evaluation of atrial fibrillation.  She was see in the hospital in July 2019 after syncope but there was not a clear etiology and other than probable dehydration.  She did have a complete work up with an unremarkable echo, negative perfusion study and an event monitor without pauses and with chronic atrial fib.  She returns for follow up.    She is doing well.  She is raising her 24-year-old grandson [name] who is now home schooling. The patient denies any new symptoms such as chest discomfort, neck or arm discomfort. There has been no new shortness of breath, PND or orthopnea. There have been no reported palpitations, presyncope or syncope.     Past Medical History:  Diagnosis Date  . Anticoagulated on Coumadin   . Arthritis    wrist, hands -  . Atrial fibrillation, persistent ([place])   . Bladder tumor   . CAD (coronary artery disease) cardiologist-  dr hochrien   PTCA RCA 1999  . Cataract   . Diabetes ([place])   . Full dentures   . GERD (gastroesophageal reflux disease)   . History of colon polyps    2005  hyperplastic  . History of MI (myocardial infarction)    1999  . HLD (hyperlipidemia)   . Hypertension   . Hypothyroidism   . SUI (stress urinary incontinence, female)   . Wears glasses     Past Surgical History:  Procedure Laterality Date  . CARDIOVASCULAR STRESS TEST  05-01-2009   dr Rahul Malinak   normal nuclear study/  no ischemia or scar/  normal LV function and wall motion, ef 71%  . COLONOSCOPY     multiple  . CORONARY ANGIOPLASTY  Feb 1999   PTCA to RCA  . D & C HYSTEROSCOPY/  POLYPECTOMY  05-08-2009  . HYSTEROSCOPY W/D&C  12/07/2012   Procedure:  DILATATION AND CURETTAGE /HYSTEROSCOPY;  Surgeon: Elveria Royals, Adkins;  Location: [facility];  Service: Gynecology;  Laterality: N/A;  Dilitation and curettage /hysteroscopy/biopsey of lower segment of uterus  . TOTAL HIP ARTHROPLASTY Left 12-11-2010  . TRANSTHORACIC ECHOCARDIOGRAM  09-11-2015   mild LVH, ef 55%,  mildly reduced RVSF,  trivial TR  . TRANSURETHRAL RESECTION OF BLADDER TUMOR N/A 12/01/2015   Procedure: TRANSURETHRAL RESECTION OF BLADDER TUMOR (TURBT);  Surgeon: Kathie Rhodes, Adkins;  Location: [facility];  Service: Urology;  Laterality: N/A;  . TUBAL LIGATION  1974     Current Outpatient Medications  Medication Sig Dispense Refill  . apixaban (ELIQUIS) 5 MG TABS tablet Take 5 mg by mouth 2 (two) times daily.    . calcium carbonate (TUMS - DOSED IN MG ELEMENTAL CALCIUM) 500 MG chewable tablet Chew 1 tablet by mouth as needed for indigestion or heartburn.    . diclofenac sodium (VOLTAREN) 1 % GEL Apply 4 g topically 4 (four) times daily. 100 g 0  . diltiazem (CARDIZEM CD) 240 MG 24 hr capsule TAKE 1 CAPSULE BY MOUTH EVERY DAY 90 capsule 0  . levothyroxine (SYNTHROID, LEVOTHROID) 100 MCG tablet Take 1 tablet (100 mcg total) by mouth daily. 90 tablet 3  .  lisinopril (PRINIVIL,ZESTRIL) 2.5 MG tablet Take 1 tablet (2.5 mg total) by mouth daily. 90 tablet 3  . loratadine (CLARITIN) 10 MG tablet Take 10 mg by mouth daily as needed for allergies.    . metFORMIN (GLUCOPHAGE-XR) 500 MG 24 hr tablet Take 1 tablet (500 mg total) by mouth 2 (two) times daily. 180 tablet 3  . ONETOUCH DELICA LANCETS FINE MISC TEST TWICE DAILY( 8AM AND 10PM) 100 each 0  . rosuvastatin (CRESTOR) 40 MG tablet Take 1 tablet (40 mg total) by mouth daily. 90 tablet 1  . traMADol (ULTRAM) 50 MG tablet Take 1 tablet (50 mg total) by mouth at bedtime as needed for severe pain. 10 tablet 0   No current facility-administered medications for this visit.     Allergies:   Penicillins and Percocet  [oxycodone-acetaminophen]    ROS:  Please see the history of present illness.   Otherwise, review of systems are positive for GERD.   All other systems are reviewed and negative.    PHYSICAL EXAM: VS:  BP 136/72   Pulse 78   Ht 5\' 4"  (1.626 m)   Wt 181 lb 9.6 oz (82.4 kg)   LMP  (LMP Unknown)   BMI 31.17 kg/m  , BMI Body mass index is 31.17 kg/m. GENERAL:  Well appearing NECK:  No jugular venous distention, waveform within normal limits, carotid upstroke brisk and symmetric, no bruits, no thyromegaly LUNGS:  Clear to auscultation bilaterally BACK:  No CVA tenderness CHEST:  Unremarkable HEART:  PMI not displaced or sustained,S1 and S2 within normal limits, no S3, no clicks, no rubs, no murmurs, irregular ABD:  Flat, positive bowel sounds normal in frequency in pitch, no bruits, no rebound, no guarding, no midline pulsatile mass, no hepatomegaly, no splenomegaly EXT:  2 plus pulses throughout, no edema, no cyanosis no clubbing   EKG:  EKG is ordered today. The ekg ordered today demonstrates atrial fibrillation, rate 78, axis within normal limits, intervals within normal limits, no acute ST-T wave changes.   Recent Labs: 12/06/2018: Hemoglobin 12.2; Platelets 294 02/26/2019: BUN 18; Creatinine, Ser 1.20; Potassium 4.8; Sodium 138; TSH 0.938    Lipid Panel    Component Value Date/Time   CHOL 139 09/19/2018 0815   TRIG 115 09/19/2018 0815   HDL 60 09/19/2018 0815   CHOLHDL 2.3 09/19/2018 0815   CHOLHDL 2.6 04/25/2017 0843   VLDL 28 04/25/2017 0843   LDLCALC 56 09/19/2018 0815   LDLDIRECT 125 (H) 06/01/2010 2029      Wt Readings from Last 3 Encounters:  11/27/19 181 lb 9.6 oz (82.4 kg)  10/11/19 179 lb 9.6 oz (81.5 kg)  09/21/19 178 lb 9.6 oz (81 kg)      Other studies Reviewed: Additional studies/ records that were reviewed today include: None. Review of the above records demonstrates:  Please see elsewhere in the note.     ASSESSMENT AND PLAN:  ATRIAL  FIBRILLATION -  [name] has a CHA2DS2 - VASc score of 3.   She is going to have a CBC done this month.  She tolerates anticoagulation and has good rate control which she checks with a pulse ox.  No change in therapy.   ESSENTIAL HYPERTENSION, BENIGN -  The blood pressure is  at target.  No change in therapy.   CAD - She had a negative perfusion study last year.  She is had no new symptoms.  She will continue with risk reduction.   DYSLIPIDEMIA - Her  LDL is last year was excellent and she is getting get this repeated in December.  She will continue on the meds as listed.   DM - A1c was 6.0 last year.  She is getting this repeated.  She will continue the meds as listed.   COVID EDUCATION: We talked about the vaccine.  Current medicines are reviewed at length with the patient today.  The patient does not have concerns regarding medicines.  The following changes have been made:  no change  Labs/ tests ordered today include: None  Orders Placed This Encounter  Procedures  . EKG 12-Lead     Disposition:   FU with me in one year.     Signed, [name]  11/27/2019 5:27 PM    [facility]

## 2019-11-27 ENCOUNTER — Other Ambulatory Visit: Payer: Self-pay

## 2019-11-27 ENCOUNTER — Encounter: Payer: Self-pay | Admitting: Cardiology

## 2019-11-27 ENCOUNTER — Ambulatory Visit (INDEPENDENT_AMBULATORY_CARE_PROVIDER_SITE_OTHER): Payer: Medicare Other | Admitting: Cardiology

## 2019-11-27 VITALS — BP 136/72 | HR 78 | Ht 64.0 in | Wt 181.6 lb

## 2019-11-27 DIAGNOSIS — I2510 Atherosclerotic heart disease of native coronary artery without angina pectoris: Secondary | ICD-10-CM

## 2019-11-27 DIAGNOSIS — E785 Hyperlipidemia, unspecified: Secondary | ICD-10-CM | POA: Diagnosis not present

## 2019-11-27 DIAGNOSIS — I4891 Unspecified atrial fibrillation: Secondary | ICD-10-CM

## 2019-11-27 DIAGNOSIS — E119 Type 2 diabetes mellitus without complications: Secondary | ICD-10-CM

## 2019-11-27 DIAGNOSIS — I10 Essential (primary) hypertension: Secondary | ICD-10-CM

## 2019-11-27 DIAGNOSIS — Z7189 Other specified counseling: Secondary | ICD-10-CM

## 2019-11-27 NOTE — Patient Instructions (Signed)
Medication Instructions:  Your physician recommends that you continue on your current medications as directed. Please refer to the Current Medication list given to you today.  *If you need a refill on your cardiac medications before your next appointment, please call your pharmacy*  Lab Work: none If you have labs (blood work) drawn today and your tests are completely normal, you will receive your results only by: Marland Kitchen MyChart Message (if you have MyChart) OR . A paper copy in the mail If you have any lab test that is abnormal or we need to change your treatment, we will call you to review the results.  Testing/Procedures: none  Follow-Up: At [facility], you and your health needs are our priority.  As part of our continuing mission to provide you with exceptional heart care, we have created designated Provider Care Teams.  These Care Teams include your primary Cardiologist (physician) and Advanced Practice Providers (APPs -  Physician Assistants and Nurse Practitioners) who all work together to provide you with the care you need, when you need it.  Your next appointment:   12 month(s)  The format for your next appointment:   Either In Person or Virtual  Provider:   You may see Minus Breeding, MD or one of the following Advanced Practice Providers on your designated Care Team:    Rosaria Ferries, PA-C  Jory Sims, DNP, ANP  Cadence Kathlen Mody, NP

## 2019-12-04 ENCOUNTER — Ambulatory Visit (INDEPENDENT_AMBULATORY_CARE_PROVIDER_SITE_OTHER): Payer: Medicare Other | Admitting: Family Medicine

## 2019-12-04 ENCOUNTER — Other Ambulatory Visit: Payer: Self-pay

## 2019-12-04 VITALS — BP 118/75 | HR 80 | Wt 180.4 lb

## 2019-12-04 DIAGNOSIS — E785 Hyperlipidemia, unspecified: Secondary | ICD-10-CM

## 2019-12-04 DIAGNOSIS — E119 Type 2 diabetes mellitus without complications: Secondary | ICD-10-CM | POA: Diagnosis not present

## 2019-12-04 DIAGNOSIS — M1711 Unilateral primary osteoarthritis, right knee: Secondary | ICD-10-CM

## 2019-12-04 DIAGNOSIS — N3946 Mixed incontinence: Secondary | ICD-10-CM

## 2019-12-04 DIAGNOSIS — I4891 Unspecified atrial fibrillation: Secondary | ICD-10-CM

## 2019-12-04 DIAGNOSIS — Z7901 Long term (current) use of anticoagulants: Secondary | ICD-10-CM

## 2019-12-04 DIAGNOSIS — N1831 Chronic kidney disease, stage 3a: Secondary | ICD-10-CM

## 2019-12-04 LAB — POCT GLYCOSYLATED HEMOGLOBIN (HGB A1C): HbA1c, POC (controlled diabetic range): 6.1 % (ref 0.0–7.0)

## 2019-12-04 MED ORDER — TRAMADOL HCL 50 MG PO TABS: 50.0000 mg | ORAL_TABLET | Freq: Every evening | ORAL | 0 refills | Status: DC | PRN

## 2019-12-04 NOTE — Progress Notes (Signed)
   [facility] Phone: [phone]     [name] - 68 y.o. female MRN [id number]  Date of birth: [birth year]  Subjective:   cc: diabetes,   HPI:  Patient continues to take her Metformin.  She has not taken her blood sugar since our discussion last visit.  Patient denies polydipsia, polyuria.  She admits she is gained 2 or 3 pounds and "splurged" on the Thanksgiving holiday.  The ophthalmologist she went to for diabetic retinopathy was [place] on [address].  Incontinence: Patient states she has trouble making it to the bathroom on time when she has to urinate.  Also notes she will sometimes urinate/leak urine when she coughs.  This is been a chronic problem.  Atrial fibrillation: Patient recently went to her cardiologist and states he wanted her to get her lab work done by her PCP.  Specifically wants to check for anemia.  Patient is on Eliquis and diltiazem.  No palpitations or chest pain.  Patient denies any hemoptysis/hematemesis/hematuria/hematochezia.  Denies weakness or dyspnea on exertion.  ROS: See HPI for pertinent positives and negatives  Family history reviewed for today's visit. No changes.  Social history- patient is a former smoker  -  reports that she quit smoking about 38 years ago. Her smoking use included cigarettes. She has a 22.50 pack-year smoking history. She has never used smokeless tobacco.  Objective:   BP 118/75   Pulse 80   Wt 180 lb 6.4 oz (81.8 kg)   LMP  (LMP Unknown)   SpO2 98%   BMI 30.97 kg/m  Gen: Alert and oriented.  No acute distress. HEENT: No scleral icterus.  Moist oral mucosa. Neck: No thyromegaly CV: Regular rhythm.  Normal rate. Resp: Lungs clear to auscultation bilaterally. Skin: No bruising seen on extremities  Assessment/Plan:   Diabetes (HCC) A1c 6.1 on Metformin XR 500 twice daily.  This is been stable for the past year.  Will now spread A1c checks to every 6 months.  Advised patient to continue limiting  carbohydrate intake. -We will send in records request for the diabetic retinopathy exam. -Lipid panel  Atrial fibrillation ([place]) Patient on Eliquis and diltiazem.  Well controlled on both.  Asymptomatic.  No bleeding episodes. -CBC -BMP  Osteoarthritis of right knee Continues to have arthritic pain in the knee as well as the hip.  Patient able to control pain enough to function. -10 tabs of tramadol for severe pain.  Urinary incontinence Chronic issue.  Previous note from 2012.  Not worsening at the moment.  Discussed pelvic floor exercises and timed voids as well as limiting fluid intake before bedtime.     [name], MD PGY-2 [facility]

## 2019-12-04 NOTE — Patient Instructions (Signed)
It was nice to see you again today,  Your A1c is great again.  It was only 6.1 after splurging on Thanksgiving dinner.  Keep up the good work.  I do not need to check it every 3 months, every 6 months from here on out is fine as long as it is well controlled.  I have ordered other lab test to check for cholesterol, kidney function, anemia.  I will let you know the results of these when I get them.  For the urinary incontinence, you can try some exercises.  You do this by mentioning that you are stopping your urine midstream.  You contract those muscles 10 times.  Do this multiple times throughout the day.  You can also hold this contraction for 10 seconds at a time for another exercise.  Also tried timed voids.  If you do not need to go the bathroom, you can still try to urinate every 2 hours or sooner if you need to.  This will help your bladder from getting too full.  Please remember to schedule appointment for 6 months from now.  Have a great day,  [name], MD

## 2019-12-05 LAB — BASIC METABOLIC PANEL
BUN/Creatinine Ratio: 15 (ref 12–28)
BUN: 15 mg/dL (ref 8–27)
CO2: 23 mmol/L (ref 20–29)
Calcium: 9.6 mg/dL (ref 8.7–10.3)
Chloride: 103 mmol/L (ref 96–106)
Creatinine, Ser: 0.99 mg/dL (ref 0.57–1.00)
GFR calc Af Amer: 67 mL/min/{1.73_m2} (ref >59)
GFR calc non Af Amer: 58 mL/min/{1.73_m2} — ABNORMAL LOW (ref >59)
Glucose: 115 mg/dL — ABNORMAL HIGH (ref 65–99)
Potassium: 4.6 mmol/L (ref 3.5–5.2)
Sodium: 142 mmol/L (ref 134–144)

## 2019-12-05 LAB — CBC
Hematocrit: 37.5 % (ref 34.0–46.6)
Hemoglobin: 12.5 g/dL (ref 11.1–15.9)
MCH: 28.7 pg (ref 26.6–33.0)
MCHC: 33.3 g/dL (ref 31.5–35.7)
MCV: 86 fL (ref 79–97)
Platelets: 279 10*3/uL (ref 150–450)
RBC: 4.35 x10E6/uL (ref 3.77–5.28)
RDW: 13.3 % (ref 11.7–15.4)
WBC: 8.9 10*3/uL (ref 3.4–10.8)

## 2019-12-05 LAB — LIPID PANEL
Chol/HDL Ratio: 2.3 ratio (ref 0.0–4.4)
Cholesterol, Total: 147 mg/dL (ref 100–199)
HDL: 64 mg/dL (ref >39)
LDL Chol Calc (NIH): 62 mg/dL (ref 0–99)
Triglycerides: 119 mg/dL (ref 0–149)
VLDL Cholesterol Cal: 21 mg/dL (ref 5–40)

## 2019-12-06 ENCOUNTER — Encounter: Payer: Self-pay | Admitting: Family Medicine

## 2019-12-06 DIAGNOSIS — N183 Chronic kidney disease, stage 3 unspecified: Secondary | ICD-10-CM | POA: Insufficient documentation

## 2019-12-06 NOTE — Assessment & Plan Note (Signed)
Chronic issue.  Previous note from 2012.  Not worsening at the moment.  Discussed pelvic floor exercises and timed voids as well as limiting fluid intake before bedtime.

## 2019-12-06 NOTE — Assessment & Plan Note (Signed)
Continues to have arthritic pain in the knee as well as the hip.  Patient able to control pain enough to function. -10 tabs of tramadol for severe pain.

## 2019-12-06 NOTE — Assessment & Plan Note (Signed)
Patient on Eliquis and diltiazem.  Well controlled on both.  Asymptomatic.  No bleeding episodes. -CBC -BMP

## 2019-12-06 NOTE — Assessment & Plan Note (Addendum)
A1c 6.1 on Metformin XR 500 twice daily.  This is been stable for the past year.  Will now spread A1c checks to every 6 months.  Advised patient to continue limiting carbohydrate intake. -We will send in records request for the diabetic retinopathy exam. -Lipid panel

## 2019-12-06 NOTE — Assessment & Plan Note (Addendum)
Discovered on pt's BMP after most recent visit on 12/8.  Consistent GFR in 50s over past year. Pt currently on lisinopril 2.5mg  for HTN with systolic BP consistently < 120.  - will need further workup at next visit.  Consider sending pt to get renal u/s before her next visit.

## 2020-01-01 ENCOUNTER — Other Ambulatory Visit: Payer: Self-pay | Admitting: Cardiology

## 2020-01-02 ENCOUNTER — Other Ambulatory Visit: Payer: Self-pay | Admitting: *Deleted

## 2020-01-07 MED ORDER — METFORMIN HCL ER 500 MG PO TB24: 500.0000 mg | ORAL_TABLET | Freq: Two times a day (BID) | ORAL | 3 refills | Status: DC

## 2020-02-25 ENCOUNTER — Other Ambulatory Visit: Payer: Self-pay | Admitting: Cardiology

## 2020-02-25 ENCOUNTER — Other Ambulatory Visit: Payer: Self-pay

## 2020-02-27 MED ORDER — LEVOTHYROXINE SODIUM 100 MCG PO TABS: 100.0000 ug | ORAL_TABLET | Freq: Every day | ORAL | 3 refills | Status: DC

## 2020-03-09 ENCOUNTER — Encounter: Payer: Self-pay | Admitting: Family Medicine

## 2020-03-10 ENCOUNTER — Other Ambulatory Visit: Payer: Self-pay

## 2020-03-10 DIAGNOSIS — G4733 Obstructive sleep apnea (adult) (pediatric): Secondary | ICD-10-CM | POA: Diagnosis not present

## 2020-03-11 MED ORDER — LISINOPRIL 2.5 MG PO TABS: 2.5000 mg | ORAL_TABLET | Freq: Every day | ORAL | 3 refills | Status: DC

## 2020-03-13 DIAGNOSIS — Z13820 Encounter for screening for osteoporosis: Secondary | ICD-10-CM | POA: Diagnosis not present

## 2020-04-17 ENCOUNTER — Other Ambulatory Visit: Payer: Self-pay | Admitting: Family Medicine

## 2020-04-17 DIAGNOSIS — Z1231 Encounter for screening mammogram for malignant neoplasm of breast: Secondary | ICD-10-CM

## 2020-04-21 ENCOUNTER — Ambulatory Visit
Admission: RE | Admit: 2020-04-21 | Discharge: 2020-04-21 | Disposition: A | Discharge status: Home / Self Care | Payer: Medicare Other | Source: Ambulatory Visit | Attending: Internal Medicine | Admitting: Internal Medicine

## 2020-04-21 ENCOUNTER — Other Ambulatory Visit: Payer: Self-pay

## 2020-04-21 DIAGNOSIS — Z1231 Encounter for screening mammogram for malignant neoplasm of breast: Secondary | ICD-10-CM | POA: Diagnosis not present

## 2020-06-04 ENCOUNTER — Other Ambulatory Visit: Payer: Self-pay | Admitting: Cardiology

## 2020-06-06 ENCOUNTER — Encounter: Payer: Self-pay | Admitting: Family Medicine

## 2020-06-06 DIAGNOSIS — H5201 Hypermetropia, right eye: Secondary | ICD-10-CM | POA: Diagnosis not present

## 2020-06-06 DIAGNOSIS — H5212 Myopia, left eye: Secondary | ICD-10-CM | POA: Diagnosis not present

## 2020-06-06 DIAGNOSIS — H40013 Open angle with borderline findings, low risk, bilateral: Secondary | ICD-10-CM | POA: Diagnosis not present

## 2020-06-06 DIAGNOSIS — H52223 Regular astigmatism, bilateral: Secondary | ICD-10-CM | POA: Diagnosis not present

## 2020-06-06 LAB — HM DIABETES EYE EXAM

## 2020-06-09 DIAGNOSIS — G4733 Obstructive sleep apnea (adult) (pediatric): Secondary | ICD-10-CM | POA: Diagnosis not present

## 2020-06-12 NOTE — Progress Notes (Signed)
    SUBJECTIVE:   CHIEF COMPLAINT / HPI:   Numbness: Patient is complaining of worsening numbness, paresthesias in her fingertips.  This has been happening over a period of at least 1 year.  On the left hand it is constant and all 5 fingertips and the numbness ends at the distal interphalangeal joint.  On the right hand it is mostly the digits 1 through 3 and symptoms wax and wane.  She says sometimes she drops things because of her poor sensation.  She has in the past worn wrist splints bilaterally at night for carpal tunnel syndrome, but she thinks "this makes it worse".  She still wears them occasionally.  She has no sensation issues in her lower extremities.  Hypothyroidism: The patient states that she is compliant with her Synthroid.  She denies hot or cold sensation, arrhythmia.  covid -received her shots in February and march - [facility].    PERTINENT  PMH / PSH: Diabetes, hypothyroidism, hypertension, CKD  OBJECTIVE:   BP 108/64   Pulse 65   Ht 5\' 3"  (1.6 m)   Wt 189 lb 12.8 oz (86.1 kg)   LMP  (LMP Unknown)   SpO2 98%   BMI 33.62 kg/m   General: Alert and oriented.  No distress.  Normal-appearing female, appears slightly younger than stated age. CV: Regular rate and rhythm, no murmurs Pulmonary: Lungs clear to auscultation bilaterally.  No wheezes or crackles. Neuro: Upper extremity strength equal bilaterally.  Grip strength 4/5 bilateral.  Monofilament test negative for anesthesia of the fingertips bilaterally.  ASSESSMENT/PLAN:   Peripheral neuropathy Bilateral upper extremity neuropathy in the distal fingertips, left worse than right that has been progressive since at least a year ago.  Patient has diabetes, but it is very well controlled and her symptoms are not present in the lower extremities, which you would expect in the stocking glove distribution, and to diabetic polyneuropathy.  I also do not think this is a process originating from the spinal column given the  patient's lack of cervical neck pain or tenderness.  We will get labs to look for vitamin deficiencies and refer the patient to physical medicine rehabilitation for neuromuscular studies.  Hypothyroidism Has been over 1 year since TSH was last ordered.  Will order today.  Follow-up with results.  Diabetes ([place]) A1c again below 6.5 with just use of Metformin 500 twice daily.  Encouraged patient to continue what she is doing in regards to diet and exercise and to continue taking her medication.    Benay Pike, MD [place]

## 2020-06-13 ENCOUNTER — Encounter: Payer: Self-pay | Admitting: Family Medicine

## 2020-06-13 ENCOUNTER — Other Ambulatory Visit: Payer: Self-pay

## 2020-06-13 ENCOUNTER — Ambulatory Visit (INDEPENDENT_AMBULATORY_CARE_PROVIDER_SITE_OTHER): Payer: Medicare Other | Admitting: Family Medicine

## 2020-06-13 VITALS — BP 108/64 | HR 65 | Ht 63.0 in | Wt 189.8 lb

## 2020-06-13 DIAGNOSIS — E119 Type 2 diabetes mellitus without complications: Secondary | ICD-10-CM

## 2020-06-13 DIAGNOSIS — E039 Hypothyroidism, unspecified: Secondary | ICD-10-CM | POA: Diagnosis not present

## 2020-06-13 DIAGNOSIS — G629 Polyneuropathy, unspecified: Secondary | ICD-10-CM | POA: Diagnosis not present

## 2020-06-13 DIAGNOSIS — G6289 Other specified polyneuropathies: Secondary | ICD-10-CM

## 2020-06-13 LAB — POCT GLYCOSYLATED HEMOGLOBIN (HGB A1C): HbA1c, POC (controlled diabetic range): 6.2 % (ref 0.0–7.0)

## 2020-06-13 MED ORDER — GABAPENTIN 100 MG PO CAPS: 100.0000 mg | ORAL_CAPSULE | Freq: Three times a day (TID) | ORAL | 3 refills | Status: DC

## 2020-06-13 NOTE — Patient Instructions (Addendum)
I have referred you to physical medicine and rehabilitation so that they can evaluate your numbness further with some nerve conduction testing if they feel like it is necessary.  I have prescribed you gabapentin that you can take at night before bedtime.  I would like to see you back in 3 to 4 weeks so we can discuss the gabapentin effectiveness and your referral to physical medicine rehab.  I will call you with the results of the lab tests when we get them.   Gabapentin capsules or tablets What is this medicine? GABAPENTIN (GA ba pen tin) is used to control seizures in certain types of epilepsy. It is also used to treat certain types of nerve pain. This medicine may be used for other purposes; ask your health care provider or pharmacist if you have questions. COMMON BRAND NAME(S): Active-PAC with Gabapentin, Gabarone, Neurontin What should I tell my health care provider before I take this medicine? They need to know if you have any of these conditions:  history of drug abuse or alcohol abuse problem  kidney disease  lung or breathing disease  suicidal thoughts, plans, or attempt; a previous suicide attempt by you or a family member  an unusual or allergic reaction to gabapentin, other medicines, foods, dyes, or preservatives  pregnant or trying to get pregnant  breast-feeding How should I use this medicine? Take this medicine by mouth with a glass of water. Follow the directions on the prescription label. You can take it with or without food. If it upsets your stomach, take it with food. Take your medicine at regular intervals. Do not take it more often than directed. Do not stop taking except on your doctor's advice. If you are directed to break the 600 or 800 mg tablets in half as part of your dose, the extra half tablet should be used for the next dose. If you have not used the extra half tablet within 28 days, it should be thrown away. A special MedGuide will be given to you by the  pharmacist with each prescription and refill. Be sure to read this information carefully each time. Talk to your pediatrician regarding the use of this medicine in children. While this drug may be prescribed for children as young as 3 years for selected conditions, precautions do apply. Overdosage: If you think you have taken too much of this medicine contact a poison control center or emergency room at once. NOTE: This medicine is only for you. Do not share this medicine with others. What if I miss a dose? If you miss a dose, take it as soon as you can. If it is almost time for your next dose, take only that dose. Do not take double or extra doses. What may interact with this medicine? This medicine may interact with the following medications:  alcohol  antihistamines for allergy, cough, and cold  certain medicines for anxiety or sleep  certain medicines for depression like amitriptyline, fluoxetine, sertraline  certain medicines for seizures like phenobarbital, primidone  certain medicines for stomach problems  general anesthetics like halothane, isoflurane, methoxyflurane, propofol  local anesthetics like lidocaine, pramoxine, tetracaine  medicines that relax muscles for surgery  narcotic medicines for pain  phenothiazines like chlorpromazine, mesoridazine, prochlorperazine, thioridazine This list may not describe all possible interactions. Give your health care provider a list of all the medicines, herbs, non-prescription drugs, or dietary supplements you use. Also tell them if you smoke, drink alcohol, or use illegal drugs. Some items may interact  with your medicine. What should I watch for while using this medicine? Visit your doctor or health care provider for regular checks on your progress. You may want to keep a record at home of how you feel your condition is responding to treatment. You may want to share this information with your doctor or health care provider at each  visit. You should contact your doctor or health care provider if your seizures get worse or if you have any new types of seizures. Do not stop taking this medicine or any of your seizure medicines unless instructed by your doctor or health care provider. Stopping your medicine suddenly can increase your seizures or their severity. This medicine may cause serious skin reactions. They can happen weeks to months after starting the medicine. Contact your health care provider right away if you notice fevers or flu-like symptoms with a rash. The rash may be red or purple and then turn into blisters or peeling of the skin. Or, you might notice a red rash with swelling of the face, lips or lymph nodes in your neck or under your arms. Wear a medical identification bracelet or chain if you are taking this medicine for seizures, and carry a card that lists all your medications. You may get drowsy, dizzy, or have blurred vision. Do not drive, use machinery, or do anything that needs mental alertness until you know how this medicine affects you. To reduce dizzy or fainting spells, do not sit or stand up quickly, especially if you are an older patient. Alcohol can increase drowsiness and dizziness. Avoid alcoholic drinks. Your mouth may get dry. Chewing sugarless gum or sucking hard candy, and drinking plenty of water will help. The use of this medicine may increase the chance of suicidal thoughts or actions. Pay special attention to how you are responding while on this medicine. Any worsening of mood, or thoughts of suicide or dying should be reported to your health care provider right away. Women who become pregnant while using this medicine may enroll in the [place] Pregnancy Registry by calling [phone]. This registry collects information about the safety of antiepileptic drug use during pregnancy. What side effects may I notice from receiving this medicine? Side effects that you should  report to your doctor or health care professional as soon as possible:  allergic reactions like skin rash, itching or hives, swelling of the face, lips, or tongue  breathing problems  rash, fever, and swollen lymph nodes  redness, blistering, peeling or loosening of the skin, including inside the mouth  suicidal thoughts, mood changes Side effects that usually do not require medical attention (report to your doctor or health care professional if they continue or are bothersome):  dizziness  drowsiness  headache  nausea, vomiting  swelling of ankles, feet, hands  tiredness This list may not describe all possible side effects. Call your doctor for medical advice about side effects. You may report side effects to FDA at 1-800-FDA-1088. Where should I keep my medicine? Keep out of reach of children. This medicine may cause accidental overdose and death if it taken by other adults, children, or pets. Mix any unused medicine with a substance like cat litter or coffee grounds. Then throw the medicine away in a sealed container like a sealed bag or a coffee can with a lid. Do not use the medicine after the expiration date. Store at room temperature between 15 and 30 degrees C (59 and 86 degrees F). NOTE: This sheet  is a summary. It may not cover all possible information. If you have questions about this medicine, talk to your doctor, pharmacist, or health care provider.  2020 Elsevier/Gold Standard (2019-03-16 14:16:43)

## 2020-06-15 DIAGNOSIS — G629 Polyneuropathy, unspecified: Secondary | ICD-10-CM | POA: Insufficient documentation

## 2020-06-15 NOTE — Assessment & Plan Note (Signed)
A1c again below 6.5 with just use of Metformin 500 twice daily.  Encouraged patient to continue what she is doing in regards to diet and exercise and to continue taking her medication.

## 2020-06-15 NOTE — Assessment & Plan Note (Signed)
Bilateral upper extremity neuropathy in the distal fingertips, left worse than right that has been progressive since at least a year ago.  Patient has diabetes, but it is very well controlled and her symptoms are not present in the lower extremities, which you would expect in the stocking glove distribution, and to diabetic polyneuropathy.  I also do not think this is a process originating from the spinal column given the patient's lack of cervical neck pain or tenderness.  We will get labs to look for vitamin deficiencies and refer the patient to physical medicine rehabilitation for neuromuscular studies.

## 2020-06-15 NOTE — Assessment & Plan Note (Signed)
Has been over 1 year since TSH was last ordered.  Will order today.  Follow-up with results.

## 2020-06-19 LAB — TSH: TSH: 1.43 u[IU]/mL (ref 0.450–4.500)

## 2020-06-19 LAB — VITAMIN B1: Thiamine: 149.8 nmol/L (ref 66.5–200.0)

## 2020-06-19 LAB — VITAMIN B12: Vitamin B-12: 328 pg/mL (ref 232–1245)

## 2020-06-19 LAB — FOLATE: Folate: 4.9 ng/mL (ref >3.0)

## 2020-07-01 ENCOUNTER — Other Ambulatory Visit: Payer: Self-pay | Admitting: Cardiology

## 2020-07-01 NOTE — Progress Notes (Signed)
    SUBJECTIVE:   CHIEF COMPLAINT / HPI:   Paresthesia of fingers:  Pt states her tingling sensation has improved.  It is now only on the left hand.  She did have some drowsiness with gabapentin.  She has an appointment with the PM&R doctors tomorrow.    PERTINENT  PMH / PSH: DM, afib  OBJECTIVE:   BP 110/70   Pulse 70   Ht 5\' 3"  (1.6 m)   Wt 189 lb (85.7 kg)   LMP  (LMP Unknown)   SpO2 98%   BMI 33.48 kg/m   Gen: alert. Oriented. No acute distress.  Msk: 4/5 grip strength b/l.   Neuro: normal monofilament test in fingers.    ASSESSMENT/PLAN:   Peripheral neuropathy Improved with gabapentin.  Will increase nighttime dose to 300mg .  Discussed w/ pt option of titrating up by 100mg  at a time on her own and importance of not driving same day after increasing dose. Will f/u after the PMR appt.        Benay Pike, MD [place]

## 2020-07-02 ENCOUNTER — Ambulatory Visit (INDEPENDENT_AMBULATORY_CARE_PROVIDER_SITE_OTHER): Payer: Medicare Other | Admitting: Family Medicine

## 2020-07-02 ENCOUNTER — Encounter: Payer: Self-pay | Admitting: Family Medicine

## 2020-07-02 ENCOUNTER — Other Ambulatory Visit: Payer: Self-pay

## 2020-07-02 DIAGNOSIS — G6289 Other specified polyneuropathies: Secondary | ICD-10-CM

## 2020-07-02 MED ORDER — GABAPENTIN 100 MG PO CAPS: ORAL_CAPSULE | ORAL | 3 refills | Status: DC

## 2020-07-02 NOTE — Patient Instructions (Signed)
It was nice to see you today,  I will increase your nightly dose to 300 mg of gabapentin.  You can continue taking the 100 mg doses in the morning and afternoon.  I would like to follow up with you in approximately 1 month so we can discuss what the PM&R doctor has found and how the changes in your medication are going.    Have a great day,   [name], MD

## 2020-07-03 ENCOUNTER — Other Ambulatory Visit: Payer: Self-pay

## 2020-07-03 ENCOUNTER — Encounter: Payer: Medicare Other | Attending: Physical Medicine & Rehabilitation | Admitting: Physical Medicine & Rehabilitation

## 2020-07-03 ENCOUNTER — Encounter: Payer: Self-pay | Admitting: Physical Medicine & Rehabilitation

## 2020-07-03 VITALS — BP 109/69 | HR 75 | Temp 98.7°F | Ht 63.0 in | Wt 191.6 lb

## 2020-07-03 DIAGNOSIS — R202 Paresthesia of skin: Secondary | ICD-10-CM | POA: Diagnosis not present

## 2020-07-03 NOTE — Progress Notes (Signed)
Subjective:    Patient ID: [name], female    DOB: 1951-07-21, 69 y.o.   MRN: [id number] Pt referred by [facility]  Dr Jeannine Kitten for finger paresthesias HPI CC:  TIngling in fingers Tingling and numbness in Left hand starting ~6mo ago  Tingling only in Right hand ~68mo ago  No neck pain or hx of neck injuries  Pt had a fall last Oct 15 2019 Slightly angulated fracture of 5th Metacarpal Subtle fx of Left radial head   Pt feels like all fingers are affected Pt is dropping objects everyday Symptoms most problematic at night in left ulnar palm  Pain /numbness increased while driving , also tingling while reading books   Hx of DM for ~25yrs A1C 6.1- no numbness or tingling in feet  No ETOH Hx Hypothyoid on levothyroxine, Last TSH in normal range  Pain Inventory Average Pain 7 Pain Right Now 5 My pain is intermittent, tingling and aching  In the last 24 hours, has pain interfered with the following? General activity 5 Relation with others 0 Enjoyment of life 0 What TIME of day is your pain at its worst? evening Sleep (in general) Good  Pain is worse with: tingling in right hand finger tips. Pain improves with: medication Relief from Meds: 3  Mobility ability to climb steps?  yes do you drive?  yes Do you have any goals in this area?  yes  Function retired I need assistance with the following:  No help needed.  Neuro/Psych weakness numbness tingling  Prior Studies New Patient.  Physicians involved in your care New Patient   Family History  Problem Relation Age of Onset  . Diabetes Mother   . Heart disease Mother   . Colon cancer Mother 59  . Heart disease Father   . Cancer Sister        Lung  . Cancer Brother        intestinal  . Atrial fibrillation Brother   . Cancer Brother        throat ca  . Cancer Brother        bladder  . Breast cancer Neg Hx    Social History   Socioeconomic History  . Marital status: Married    Spouse name: Eddie Dibbles   . Number of children: 5  . Years of education: 26  . Highest education level: 12th grade  Occupational History  . Occupation: retired    Comment: quality oil  Tobacco Use  . Smoking status: Former Smoker    Packs/day: 1.50    Years: 15.00    Pack years: 22.50    Types: Cigarettes    Quit date: 11/09/1981    Years since quitting: 38.6  . Smokeless tobacco: Never Used  . Tobacco comment: no plans to start  Vaping Use  . Vaping Use: Never used  Substance and Sexual Activity  . Alcohol use: No  . Drug use: No  . Sexual activity: Yes    Birth control/protection: Post-menopausal  Other Topics Concern  . Not on file  Social History Narrative   Has retired from [employer].  Has 5 adult children all living in the area.      Emergency Contact: husband, Kasia Trego, [phone]   End of Life Plan: discussed AD, gave pt pamphlet to fill out   Who lives with you: husband, one son, one great-grandson   Lives in one level house. No stairs. Smoke alarms, has a grab-bar at the bath tub.  No throw rugs on floor.   Diet: Pt has a varied diet of protein, starch and vegetables, fruits. Leans toward vegetable.   Exercise: Pt has no regular exercise routine. Does try to walk at least three times a week.   Seatbelts: Pt reports wearing seatbelt when in vehicles.    Hobbies: likes to play pool, likes to read and do crossword puzzles   Social Determinants of Radio broadcast assistant Strain:   . Difficulty of Paying Living Expenses:   Food Insecurity:   . Worried About Charity fundraiser in the Last Year:   . Arboriculturist in the Last Year:   Transportation Needs:   . Film/video editor (Medical):   Marland Kitchen Lack of Transportation (Non-Medical):   Physical Activity:   . Days of Exercise per Week:   . Minutes of Exercise per Session:   Stress:   . Feeling of Stress :   Social Connections:   . Frequency of Communication with Friends and Family:   . Frequency of Social Gatherings with  Friends and Family:   . Attends Religious Services:   . Active Member of Clubs or Organizations:   . Attends Archivist Meetings:   Marland Kitchen Marital Status:    Past Surgical History:  Procedure Laterality Date  . CARDIOVASCULAR STRESS TEST  05-01-2009   dr hochrein   normal nuclear study/  no ischemia or scar/  normal LV function and wall motion, ef 71%  . COLONOSCOPY     multiple  . CORONARY ANGIOPLASTY  Feb 1999   PTCA to RCA  . D & C HYSTEROSCOPY/  POLYPECTOMY  05-08-2009  . HYSTEROSCOPY WITH D & C  12/07/2012   Procedure: DILATATION AND CURETTAGE /HYSTEROSCOPY;  Surgeon: Elveria Royals, MD;  Location: [facility];  Service: Gynecology;  Laterality: N/A;  Dilitation and curettage /hysteroscopy/biopsey of lower segment of uterus  . TOTAL HIP ARTHROPLASTY Left 12-11-2010  . TRANSTHORACIC ECHOCARDIOGRAM  09-11-2015   mild LVH, ef 55%,  mildly reduced RVSF,  trivial TR  . TRANSURETHRAL RESECTION OF BLADDER TUMOR N/A 12/01/2015   Procedure: TRANSURETHRAL RESECTION OF BLADDER TUMOR (TURBT);  Surgeon: Kathie Rhodes, MD;  Location[facility];  Service: Urology;  Laterality: N/A;  . TUBAL LIGATION  1974   Past Medical History:  Diagnosis Date  . Anticoagulated on Coumadin   . Arthritis    wrist, hands -  . Atrial fibrillation, persistent ([place])   . Bladder tumor   . CAD (coronary artery disease) cardiologist-  dr hochrien   PTCA RCA 1999  . Cataract   . Diabetes ([place])   . Full dentures   . GERD (gastroesophageal reflux disease)   . History of colon polyps    2005  hyperplastic  . History of MI (myocardial infarction)    1999  . HLD (hyperlipidemia)   . Hypertension   . Hypothyroidism   . SUI (stress urinary incontinence, female)   . Wears glasses    BP 109/69   Pulse 75   Temp 98.7 F (37.1 C)   Ht 5\' 3"  (1.6 m)   Wt 191 lb 9.6 oz (86.9 kg)   LMP  (LMP Unknown)   SpO2 94%   BMI 33.94 kg/m   Opioid Risk Score:   Fall Risk Score:  `1  Depression  screen PHQ 2/9  Depression screen [facility] 2/9 07/03/2020 07/02/2020 06/13/2020 12/04/2019 10/11/2019 09/21/2019 07/11/2019  Decreased Interest 0 0 0 0 0 0 0  Down, Depressed, Hopeless 0 0 0 0 0 0 0  PHQ - 2 Score 0 0 0 0 0 0 0  Altered sleeping 1 - - - - - -  Tired, decreased energy 1 - - - - - -  Change in appetite 0 - - - - - -  Feeling bad or failure about yourself  0 - - - - - -  Trouble concentrating 0 - - - - - -  Moving slowly or fidgety/restless 0 - - - - - -  Suicidal thoughts 0 - - - - - -  PHQ-9 Score 2 - - - - - -  Difficult doing work/chores - - - - - - -  Some recent data might be hidden   Review of Systems  Constitutional: Negative.   HENT: Negative.   Eyes: Negative.   Respiratory: Negative.   Cardiovascular: Negative.   Gastrointestinal: Negative.   Endocrine: Negative.   Genitourinary: Negative.   Musculoskeletal: Negative.   Skin: Negative.   Allergic/Immunologic: Negative.   Neurological: Positive for weakness and numbness.       Tingling in finger tips  Hematological: Bruises/bleeds easily.  Psychiatric/Behavioral: Negative.        Objective:   Physical Exam Vitals and nursing note reviewed.  Constitutional:      Appearance: She is obese.  HENT:     Head: Normocephalic and atraumatic.  Eyes:     General: No scleral icterus.       Right eye: No discharge.        Left eye: No discharge.     Extraocular Movements: Extraocular movements intact.     Conjunctiva/sclera: Conjunctivae normal.     Pupils: Pupils are equal, round, and reactive to light.  Cardiovascular:     Rate and Rhythm: Normal rate and regular rhythm.     Heart sounds: Normal heart sounds. No murmur heard.   Pulmonary:     Effort: Pulmonary effort is normal. No respiratory distress.     Breath sounds: Normal breath sounds. No stridor. No wheezing or rhonchi.  Abdominal:     General: Abdomen is flat. Bowel sounds are normal. There is no distension.     Palpations: Abdomen is soft. There is  no mass.  Musculoskeletal:        General: No swelling or tenderness.     Cervical back: Normal range of motion. No rigidity.  Skin:    General: Skin is warm and dry.  Neurological:     Mental Status: She is alert and oriented to person, place, and time.  Psychiatric:        Mood and Affect: Mood normal.        Behavior: Behavior normal.   Motor strength is 5/5 bilateral deltoid bicep tricep grip, hip flexor knee extensor ankle dorsiflexor Sensation intact to light touch proprioception and pinprick bilateral C5 C6-C7-C8 L2-L3-L4 L5-S1 dermatome distribution Negative Tinel's, negative Phalen's No atrophy in the hand intrinsic or foot intrinsic muscles. Negative foraminal compression test Negative straight leg raising test         Assessment & Plan:  #1.  Bilateral hand paresthesias of chronic duration.  Patient has symptoms of dropping objects and exacerbation of numbness with driving. The patient has no lower extremity numbness or tingling so it is unlikely that her upper extremity symptoms represent a distal symmetric polyneuropathy. Diabetics even with good control are of greater likelihood of developing compressive neuropathies. In addition patient has a history of hypothyroidism although this  is well controlled. While the patient has symptoms of carpal tunnel she does not have any localizing neurologic signs to the median nerve distribution.  It would be helpful to further evaluate for either median or ulnar nerve compression with EMG/NCV of the upper extremities Discussed with patient agrees with plan

## 2020-07-03 NOTE — Patient Instructions (Signed)
Carpal Tunnel Syndrome  Carpal tunnel syndrome is a condition that causes pain in your hand and arm. The carpal tunnel is a narrow area that is on the palm side of your wrist. Repeated wrist motion or certain diseases may cause swelling in the tunnel. This swelling can pinch the main nerve in the wrist (median nerve). What are the causes? This condition may be caused by:  Repeated wrist motions.  Wrist injuries.  Arthritis.  A sac of fluid (cyst) or abnormal growth (tumor) in the carpal tunnel.  Fluid buildup during pregnancy. Sometimes the cause is not known. What increases the risk? The following factors may make you more likely to develop this condition:  Having a job in which you move your wrist in the same way many times. This includes jobs like being a butcher or a cashier.  Being a woman.  Having other health conditions, such as: ? Diabetes. ? Obesity. ? A thyroid gland that is not active enough (hypothyroidism). ? Kidney failure. What are the signs or symptoms? Symptoms of this condition include:  A tingling feeling in your fingers.  Tingling or a loss of feeling (numbness) in your hand.  Pain in your entire arm. This pain may get worse when you bend your wrist and elbow for a long time.  Pain in your wrist that goes up your arm to your shoulder.  Pain that goes down into your palm or fingers.  A weak feeling in your hands. You may find it hard to grab and hold items. You may feel worse at night. How is this diagnosed? This condition is diagnosed with a medical history and physical exam. You may also have tests, such as:  Electromyogram (EMG). This test checks the signals that the nerves send to the muscles.  Nerve conduction study. This test checks how well signals pass through your nerves.  Imaging tests, such as X-rays, ultrasound, and MRI. These tests check for what might be the cause of your condition. How is this treated? This condition may be treated  with:  Lifestyle changes. You will be asked to stop or change the activity that caused your problem.  Doing exercise and activities that make bones and muscles stronger (physical therapy).  Learning how to use your hand again (occupational therapy).  Medicines for pain and swelling (inflammation). You may have injections in your wrist.  A wrist splint.  Surgery. Follow these instructions at home: If you have a splint:  Wear the splint as told by your doctor. Remove it only as told by your doctor.  Loosen the splint if your fingers: ? Tingle. ? Lose feeling (become numb). ? Turn cold and blue.  Keep the splint clean.  If the splint is not waterproof: ? Do not let it get wet. ? Cover it with a watertight covering when you take a bath or a shower. Managing pain, stiffness, and swelling   If told, put ice on the painful area: ? If you have a removable splint, remove it as told by your doctor. ? Put ice in a plastic bag. ? Place a towel between your skin and the bag. ? Leave the ice on for 20 minutes, 2-3 times per day. General instructions  Take over-the-counter and prescription medicines only as told by your doctor.  Rest your wrist from any activity that may cause pain. If needed, talk with your boss at work about changes that can help your wrist heal.  Do any exercises as told by your doctor,   physical therapist, or occupational therapist.  Keep all follow-up visits as told by your doctor. This is important. Contact a doctor if:  You have new symptoms.  Medicine does not help your pain.  Your symptoms get worse. Get help right away if:  You have very bad numbness or tingling in your wrist or hand. Summary  Carpal tunnel syndrome is a condition that causes pain in your hand and arm.  It is often caused by repeated wrist motions.  Lifestyle changes and medicines are used to treat this problem. Surgery may help in very bad cases.  Follow your doctor's  instructions about wearing a splint, resting your wrist, keeping follow-up visits, and calling for help. This information is not intended to replace advice given to you by your health care provider. Make sure you discuss any questions you have with your health care provider. Document Revised: 04/21/2018 Document Reviewed: 04/21/2018 Elsevier Patient Education  2020 Elsevier Inc.  

## 2020-07-03 NOTE — Assessment & Plan Note (Signed)
Improved with gabapentin.  Will increase nighttime dose to 300mg .  Discussed w/ pt option of titrating up by 100mg  at a time on her own and importance of not driving same day after increasing dose. Will f/u after the PMR appt.

## 2020-07-23 ENCOUNTER — Ambulatory Visit (INDEPENDENT_AMBULATORY_CARE_PROVIDER_SITE_OTHER): Payer: Medicare Other | Admitting: Family Medicine

## 2020-07-23 ENCOUNTER — Other Ambulatory Visit: Payer: Self-pay

## 2020-07-23 VITALS — BP 106/62 | HR 78 | Ht 63.0 in | Wt 189.6 lb

## 2020-07-23 DIAGNOSIS — H900 Conductive hearing loss, bilateral: Secondary | ICD-10-CM | POA: Diagnosis not present

## 2020-07-23 DIAGNOSIS — I10 Essential (primary) hypertension: Secondary | ICD-10-CM

## 2020-07-23 DIAGNOSIS — E119 Type 2 diabetes mellitus without complications: Secondary | ICD-10-CM

## 2020-07-23 DIAGNOSIS — G6289 Other specified polyneuropathies: Secondary | ICD-10-CM

## 2020-07-23 NOTE — Progress Notes (Signed)
    SUBJECTIVE:   CHIEF COMPLAINT / HPI:   Paresthesias: went to PM&R and is scheduled to get an EMG/NCV next week.. paresthesia is improved but still present. It is mostly in the left hand now.  She takes gabapentin 3 qhs, 1 in am and one in the afternoon.  She says the medication dose not make her drowsy.    HTN: Patient is taking her prescribed medications: Cardizem, lisinopril as prescribed.  No issues with tolerability.  Earwax buildup: Patient states that she has difficulty hearing, mostly in her left ear and mostly at night.  She says that she gets this occasionally and will have to have her earwax buildup removed.  DM: Patient is taking her Metformin as prescribed.  She had a ophthalmology exam a few months ago but cannot remember the name of the doctor.  The name of the business is [place]  PMH / PSH: DM, HTN,   OBJECTIVE:   BP (!) 106/62   Pulse 78   Ht 5\' 3"  (1.6 m)   Wt 189 lb 9.6 oz (86 kg)   LMP  (LMP Unknown)   SpO2 95%   BMI 33.59 kg/m   Gen: alert.  No acute distress.  HEENT: large amount of earwax blocking TM bilaterally.   CV: RRR.  No murmurs.  Pulm: LCTAB.   ASSESSMENT/PLAN:   Peripheral neuropathy Pt being worked up by PM&R which will include nerve conduction studies.  Discussed with pt titrating up her gabapentin by 100mg  at a time.  Advised her she can titrate on her own until she reaches 300mg  TID.  At that point she should call us before titrating further.    Diabetes ([place]) Will send off paperwork for diabetic retinopathy screen that pt believes was performed at [facility].  DM well controlled. No changes to medication.     Essential hypertension, benign Well controlled on current medications.  BP great today.  No changes to medications.    Conductive hearing loss, bilateral Pt states she will periodically need earwax removed when she develops hearing loss, which she currently endorses ( L>R).  Performed earwax removal.  First  irrigation was attempted, with minimal success, so manual removal using a curette was performed.  Able to visualize TMs b/l after removal.  Advised pt to return to clinic if she develops ear pain or if hearing loss persists.       Benay Pike, MD [place]

## 2020-07-23 NOTE — Patient Instructions (Signed)
Your hearing should be better now that we removed the earwax.  I was not able to get all the earwax out of your right ear.  If you develop any pain in your ears in the next week let us know and we can make an appointment to look at them again.  If you still do not feel like his hearing is improved we can always refer you to a specialist.  I would like to see you back in 6 weeks to 3 months to discuss your paresthesias in your hand.  You can titrate up your gabapentin dose by adding a 100 mg in the morning and then another 100 mg in the afternoon.  I would not titrate up the nighttime dose without talking to [place] first.  Once you get to 300 mg each time you take it, I would hold off on titrating up on your own any further before talking to [place].  Have a great day,  [name], MD

## 2020-07-25 DIAGNOSIS — H900 Conductive hearing loss, bilateral: Secondary | ICD-10-CM | POA: Insufficient documentation

## 2020-07-25 NOTE — Assessment & Plan Note (Signed)
Well controlled on current medications.  BP great today.  No changes to medications.

## 2020-07-25 NOTE — Assessment & Plan Note (Signed)
Pt states she will periodically need earwax removed when she develops hearing loss, which she currently endorses ( L>R).  Performed earwax removal.  First irrigation was attempted, with minimal success, so manual removal using a curette was performed.  Able to visualize TMs b/l after removal.  Advised pt to return to clinic if she develops ear pain or if hearing loss persists.

## 2020-07-25 NOTE — Assessment & Plan Note (Signed)
Pt being worked up by [facility] which will include nerve conduction studies.  Discussed with pt titrating up her gabapentin by 100mg  at a time.  Advised her she can titrate on her own until she reaches 300mg  TID.  At that point she should call us before titrating further.

## 2020-07-25 NOTE — Assessment & Plan Note (Signed)
Will send off paperwork for diabetic retinopathy screen that pt believes was performed at [facility].  DM well controlled. No changes to medication.

## 2020-08-01 ENCOUNTER — Encounter: Payer: Medicare Other | Attending: Physical Medicine & Rehabilitation | Admitting: Physical Medicine & Rehabilitation

## 2020-08-01 ENCOUNTER — Other Ambulatory Visit: Payer: Self-pay

## 2020-08-01 ENCOUNTER — Encounter: Payer: Self-pay | Admitting: Physical Medicine & Rehabilitation

## 2020-08-01 VITALS — BP 124/76 | HR 84 | Temp 98.0°F | Ht 63.0 in | Wt 190.4 lb

## 2020-08-01 DIAGNOSIS — R202 Paresthesia of skin: Secondary | ICD-10-CM

## 2020-08-01 NOTE — Progress Notes (Signed)
EMG/NCV performed today to evaluate LUE numbness and tingling in fingers.  Please refer to full report scanned into media section.  Normal EMG/NCV LUE, No evidence of Ulnar or median , or radial sensory neuropathy.

## 2020-08-01 NOTE — Patient Instructions (Signed)
EMG/NCV performed today.  May use ice x 11min every 2 hrs if there is bruising at electrode insertion sites

## 2020-08-19 ENCOUNTER — Ambulatory Visit (HOSPITAL_COMMUNITY)
Admission: EM | Admit: 2020-08-19 | Discharge: 2020-08-19 | Disposition: A | Discharge status: Home / Self Care | Payer: Medicare Other | Attending: Family Medicine | Admitting: Family Medicine

## 2020-08-19 ENCOUNTER — Ambulatory Visit (HOSPITAL_COMMUNITY): Payer: Medicare Other

## 2020-08-19 ENCOUNTER — Ambulatory Visit (INDEPENDENT_AMBULATORY_CARE_PROVIDER_SITE_OTHER): Payer: Medicare Other

## 2020-08-19 ENCOUNTER — Other Ambulatory Visit: Payer: Self-pay

## 2020-08-19 ENCOUNTER — Encounter (HOSPITAL_COMMUNITY): Payer: Self-pay

## 2020-08-19 DIAGNOSIS — M25422 Effusion, left elbow: Secondary | ICD-10-CM

## 2020-08-19 DIAGNOSIS — M25522 Pain in left elbow: Secondary | ICD-10-CM | POA: Diagnosis not present

## 2020-08-19 DIAGNOSIS — W19XXXA Unspecified fall, initial encounter: Secondary | ICD-10-CM

## 2020-08-19 MED ORDER — HYDROCODONE-ACETAMINOPHEN 5-325 MG PO TABS
ORAL_TABLET | ORAL | Status: AC
  Filled 2020-08-19: qty 1

## 2020-08-19 MED ORDER — HYDROCODONE-ACETAMINOPHEN 5-325 MG PO TABS
1.0000 | ORAL_TABLET | Freq: Once | ORAL | Status: AC
  Administered 2020-08-19: 1 via ORAL

## 2020-08-19 MED ORDER — HYDROCODONE-ACETAMINOPHEN 5-325 MG PO TABS: 1.0000 | ORAL_TABLET | Freq: Four times a day (QID) | ORAL | 0 refills | Status: DC | PRN

## 2020-08-19 NOTE — Discharge Instructions (Signed)
X-ray did not show any definitive fracture.  There is some fluid, could be a small underlying fracture.  I would recommend follow up with orthopedics this week.  Recommend rest, ice, elevate. Hydrocodone for pain as needed

## 2020-08-19 NOTE — ED Triage Notes (Signed)
Pt presents with left elbow injury after slipping off of a curb this morning.

## 2020-08-19 NOTE — ED Notes (Signed)
Pt states she has someone driving her and is not driving herself.

## 2020-08-20 NOTE — ED Provider Notes (Signed)
[facility]    CSN: [id number] Arrival date & time: 08/19/20  1054      History   Chief Complaint Chief Complaint  Patient presents with  . Elbow Injury    HPI [name] is a 69 y.o. female.   Pt is a 69 year old female that presents with left elbow pain and injury.  Reporting slipping off a curb and falling on the elbow this morning.  She is limited range of motion of swelling.     Past Medical History:  Diagnosis Date  . Anticoagulated on Coumadin   . Arthritis    wrist, hands -  . Atrial fibrillation, persistent ([place])   . Bladder tumor   . CAD (coronary artery disease) cardiologist-  dr hochrien   PTCA RCA 1999  . Cataract   . Diabetes ([place])   . Full dentures   . GERD (gastroesophageal reflux disease)   . History of colon polyps    2005  hyperplastic  . History of MI (myocardial infarction)    1999  . HLD (hyperlipidemia)   . Hypertension   . Hypothyroidism   . SUI (stress urinary incontinence, female)   . Wears glasses     Patient Active Problem List   Diagnosis Date Noted  . Conductive hearing loss, bilateral 07/25/2020  . Peripheral neuropathy 06/15/2020  . Chronic kidney disease (CKD), stage III (moderate) 12/06/2019  . Dyslipidemia 11/25/2019  . Osteoarthritis of right knee 07/11/2019  . Swollen R ankle 07/11/2019  . Microalbuminuria 12/07/2018  . OSA (obstructive sleep apnea) 03/16/2018  . History of bladder cancer 08/24/2017  . Claudication ([place]) 07/21/2017  . Onychomycosis of toenail 06/09/2017  . Long-term (current) use of anticoagulants 02/19/2015  . Diabetes ([place]) 04/11/2014  . Atrial fibrillation ([place]) 12/18/2013  . Urinary incontinence 05/06/2011  . HLD (hyperlipidemia) 04/29/2009  . Obesity 04/29/2009  . Essential hypertension, benign 04/29/2009  . Coronary atherosclerosis 04/29/2009  . Hypothyroidism 02/23/2007  . MYOCARDIAL INFARCTION, OLD 02/23/2007  . Personal history of colonic adenoma 11/11/2004    Past  Surgical History:  Procedure Laterality Date  . CARDIOVASCULAR STRESS TEST  05-01-2009   dr hochrein   normal nuclear study/  no ischemia or scar/  normal LV function and wall motion, ef 71%  . COLONOSCOPY     multiple  . CORONARY ANGIOPLASTY  Feb 1999   PTCA to RCA  . D & C HYSTEROSCOPY/  POLYPECTOMY  05-08-2009  . HYSTEROSCOPY WITH D & C  12/07/2012   Procedure: DILATATION AND CURETTAGE /HYSTEROSCOPY;  Surgeon: Elveria Royals, MD;  Location: [facility];  Service: Gynecology;  Laterality: N/A;  Dilitation and curettage /hysteroscopy/biopsey of lower segment of uterus  . TOTAL HIP ARTHROPLASTY Left 12-11-2010  . TRANSTHORACIC ECHOCARDIOGRAM  09-11-2015   mild LVH, ef 55%,  mildly reduced RVSF,  trivial TR  . TRANSURETHRAL RESECTION OF BLADDER TUMOR N/A 12/01/2015   Procedure: TRANSURETHRAL RESECTION OF BLADDER TUMOR (TURBT);  Surgeon: Kathie Rhodes, MD;  Location: [facility];  Service: Urology;  Laterality: N/A;  . TUBAL LIGATION  1974    OB History   No obstetric history on file.      Home Medications    Prior to Admission medications   Medication Sig Start Date End Date Taking? Authorizing Provider  calcium carbonate (TUMS - DOSED IN MG ELEMENTAL CALCIUM) 500 MG chewable tablet Chew 1 tablet by mouth as needed for indigestion or heartburn.    [provider]  diclofenac  sodium (VOLTAREN) 1 % GEL Apply 4 g topically 4 (four) times daily. 05/18/19   Glenis Smoker, MD  diltiazem (CARDIZEM CD) 240 MG 24 hr capsule TAKE 1 CAPSULE BY MOUTH EVERY DAY 09/10/19   Minus Breeding, MD  ELIQUIS 5 MG TABS tablet TAKE 1 TABLET(5 MG) BY MOUTH TWICE DAILY 07/01/20   Minus Breeding, MD  gabapentin (NEURONTIN) 100 MG capsule 100mg  in AM and afternoon, and 300mg  at night. 07/02/20   Benay Pike, MD  HYDROcodone-acetaminophen (NORCO/VICODIN) 5-325 MG tablet Take 1-2 tablets by mouth every 6 (six) hours as needed. 08/19/20   Loura Halt A, NP  levothyroxine (SYNTHROID) 100  MCG tablet Take 1 tablet (100 mcg total) by mouth daily. 02/27/20   Benay Pike, MD  lisinopril (ZESTRIL) 2.5 MG tablet Take 1 tablet (2.5 mg total) by mouth daily. 03/11/20   Benay Pike, MD  loratadine (CLARITIN) 10 MG tablet Take 10 mg by mouth daily as needed for allergies.    [provider]  metFORMIN (GLUCOPHAGE-XR) 500 MG 24 hr tablet Take 1 tablet (500 mg total) by mouth 2 (two) times daily. 01/07/20   Benay Pike, MD  [facility] DELICA LANCETS FINE MISC TEST TWICE [phone] AND 10PM) 10/03/17   Glenis Smoker, MD  rosuvastatin (CRESTOR) 40 MG tablet TAKE 1 TABLET(40 MG) BY MOUTH DAILY 06/04/20   Minus Breeding, MD  traMADol (ULTRAM) 50 MG tablet Take 1 tablet (50 mg total) by mouth at bedtime as needed for severe pain. Patient not taking: Reported on 08/01/2020 12/04/19   Benay Pike, MD  diltiazem (DILACOR XR) 120 MG 24 hr capsule Take 1 capsule (120 mg total) by mouth daily. 09/15/11 05/15/12  Minus Breeding, MD    Family History Family History  Problem Relation Age of Onset  . Diabetes Mother   . Heart disease Mother   . Colon cancer Mother 40  . Heart disease Father   . Cancer Sister        Lung  . Cancer Brother        intestinal  . Atrial fibrillation Brother   . Cancer Brother        throat ca  . Cancer Brother        bladder  . Breast cancer Neg Hx     Social History Social History   Tobacco Use  . Smoking status: Former Smoker    Packs/day: 1.50    Years: 15.00    Pack years: 22.50    Types: Cigarettes    Quit date: 11/09/1981    Years since quitting: 38.8  . Smokeless tobacco: Never Used  . Tobacco comment: no plans to start  Vaping Use  . Vaping Use: Never used  Substance Use Topics  . Alcohol use: No  . Drug use: No     Allergies   Penicillins and Percocet [oxycodone-acetaminophen]   Review of Systems Review of Systems   Physical Exam Triage Vital Signs ED Triage Vitals  Enc Vitals Group     BP 08/19/20 1310  117/61     Pulse Rate 08/19/20 1310 68     Resp 08/19/20 1310 18     Temp 08/19/20 1310 98 F (36.7 C)     Temp Source 08/19/20 1310 Oral     SpO2 08/19/20 1310 93 %     Weight --      Height --      Head Circumference --      Peak Flow --  Pain Score 08/19/20 1312 10     Pain Loc --      Pain Edu? --      Excl. in [place]? --    No data found.  Updated Vital Signs BP 117/61 (BP Location: Right Arm)   Pulse 68   Temp 98 F (36.7 C) (Oral)   Resp 18   LMP  (LMP Unknown)   SpO2 93%   Visual Acuity Right Eye Distance:   Left Eye Distance:   Bilateral Distance:    Right Eye Near:   Left Eye Near:    Bilateral Near:     Physical Exam Vitals and nursing note reviewed.  Constitutional:      General: She is not in acute distress.    Appearance: Normal appearance. She is not ill-appearing, toxic-appearing or diaphoretic.  HENT:     Head: Normocephalic.     Nose: Nose normal.  Eyes:     Conjunctiva/sclera: Conjunctivae normal.  Pulmonary:     Effort: Pulmonary effort is normal.  Musculoskeletal:        General: Normal range of motion.     Cervical back: Normal range of motion.  Skin:    General: Skin is warm and dry.     Findings: No rash.  Neurological:     Mental Status: She is alert.  Psychiatric:        Mood and Affect: Mood normal.      UC Treatments / Results  Labs (all labs ordered are listed, but only abnormal results are displayed) Labs Reviewed - No data to display  EKG   Radiology DG Elbow Complete Left  Result Date: 08/19/2020 CLINICAL DATA:  Left elbow pain after fall today. EXAM: LEFT ELBOW - COMPLETE 3+ VIEW COMPARISON:  None. FINDINGS: Abnormal anterior and posterior fat pad displacement is noted suggesting underlying joint effusion. No gross fracture or dislocation is noted. Joint spaces are intact. IMPRESSION: Abnormal fat pad displacement is noted suggesting underlying joint effusion. No gross fracture is noted. Follow-up radiographs  are recommended to evaluate for occult fracture. Electronically Signed   By: Marijo Conception M.D.   On: 08/19/2020 14:56    Procedures Procedures (including critical care time)  Medications Ordered in UC Medications  HYDROcodone-acetaminophen (NORCO/VICODIN) 5-325 MG per tablet 1 tablet (1 tablet Oral Given 08/19/20 1359)    Initial Impression / Assessment and Plan / UC Course  I have reviewed the triage vital signs and the nursing notes.  Pertinent labs & imaging results that were available during my care of the patient were reviewed by me and considered in my medical decision making (see chart for details).     Joint effusion and injury X-ray nonspecific for any definitive fracture.  There is some small joint effusion.  Could be small underlying fracture.  Will place and sling and have her follow-up with orthopedics this week.  Rest, ice, elevate and hydrocodone for pain as needed. Hydrocodone given here with some relief of pain. Final Clinical Impressions(s) / UC Diagnoses   Final diagnoses:  Joint effusion of elbow, left  Fall, initial encounter     Discharge Instructions     X-ray did not show any definitive fracture.  There is some fluid, could be a small underlying fracture.  I would recommend follow up with orthopedics this week.  Recommend rest, ice, elevate. Hydrocodone for pain as needed    ED Prescriptions    Medication Sig Dispense Auth. Provider   HYDROcodone-acetaminophen (NORCO/VICODIN) 5-325 MG tablet  Take 1-2 tablets by mouth every 6 (six) hours as needed. 12 tablet Kelven Flater A, NP     I have reviewed the PDMP during this encounter.   Orvan July, NP 08/20/20 1213

## 2020-08-22 ENCOUNTER — Ambulatory Visit (INDEPENDENT_AMBULATORY_CARE_PROVIDER_SITE_OTHER): Payer: Medicare Other | Admitting: Family Medicine

## 2020-08-22 ENCOUNTER — Telehealth: Payer: Self-pay | Admitting: Family Medicine

## 2020-08-22 ENCOUNTER — Encounter: Payer: Self-pay | Admitting: Family Medicine

## 2020-08-22 ENCOUNTER — Other Ambulatory Visit: Payer: Self-pay

## 2020-08-22 ENCOUNTER — Ambulatory Visit
Admission: RE | Admit: 2020-08-22 | Discharge: 2020-08-22 | Disposition: A | Discharge status: Home / Self Care | Payer: Medicare Other | Source: Ambulatory Visit | Attending: Family Medicine | Admitting: Family Medicine

## 2020-08-22 VITALS — BP 105/47 | Ht 63.0 in | Wt 189.0 lb

## 2020-08-22 DIAGNOSIS — S42402A Unspecified fracture of lower end of left humerus, initial encounter for closed fracture: Secondary | ICD-10-CM

## 2020-08-22 DIAGNOSIS — M25422 Effusion, left elbow: Secondary | ICD-10-CM | POA: Diagnosis not present

## 2020-08-22 MED ORDER — TRAMADOL HCL 50 MG PO TABS: 50.0000 mg | ORAL_TABLET | Freq: Two times a day (BID) | ORAL | 0 refills | Status: DC | PRN

## 2020-08-22 MED ORDER — TRAMADOL HCL 50 MG PO TABS: 50.0000 mg | ORAL_TABLET | Freq: Four times a day (QID) | ORAL | 2 refills | Status: DC | PRN

## 2020-08-22 NOTE — Telephone Encounter (Signed)
Megan I looked at her repeat x ray films. Please call her and tell her: Improved elbow effusion No fracture She has follow up next week and she should keep that appt THANKS! Dorcas Mcmurray

## 2020-08-22 NOTE — Patient Instructions (Addendum)
Thank you for coming in to see [place] today!  Even though your initial x-ray did not show fracture, it is likely that you do a fracture based on the swelling. please see below to review our plan for today's visit:   1.   Please continue to wear the sling as needed, but try to mobilize your elbow once every hour or so. 2.   Please go to get repeat x-rays of your elbow. 3.   You may continue to take Tylenol and/or ibuprofen for pain as needed.  We will plan to follow-up in the next week to reevaluate.   Please call the clinic at [phone] if your symptoms worsen or you have any concerns. It was our pleasure to serve you.       Dr. Dagoberto Ligas Dr. Dorcas Mcmurray [facility] Medicine

## 2020-08-22 NOTE — Progress Notes (Signed)
   PCP: Benay Pike, MD  Subjective:   HPI: Patient is a 69 y.o. female here for evaluation of left elbow pain.  She reports that she was going into her car, slipped on something and landed on her left elbow directly onto the curb of the road.  She had immediate pain and went to urgent care, where x-rays of the left elbow showed a sail sign of the fat pad but no obvious fracture.  She was placed in a sling and referred here for further evaluation.  Today, patient states that her pain is improved.  She continues have swelling mostly over the medial aspect of her elbow and has decreased range of motion.  She has been using a sling.  She has not had any new falls or injuries since the initial injury 3 days ago.  Review of Systems:  Per HPI.   [place], medications and smoking status reviewed.      Objective:  Physical Exam:  Gen: awake, alert, NAD, comfortable in exam room Pulm: breathing unlabored  L Elbow Inspection: Edema noted over the medial aspect of the elbow overlying the medial epicondyle and more proximally.  No erythema, ecchymosis.  Active ROM: Decreased range of motion with flexion extension, 5 to 90 degrees.  Full range of motion to pronation, supination but with pain with both of these motions.  Strength: 5/5 strength to resisted flexion, extension  Tenderness: Positive bony tenderness to palpation of medial epicondyle, lateral epicondyle, and radial head.  Nontender over the extensor bundle or flexor bundle more distally Special tests: Stable to varus/valgus stress. No pain with resisted wrist extension. No pain with resisted wrist flexion.     Assessment & Plan:  1.  Left elbow injury Patient with improving but continued pain in her left elbow.  X-ray done at urgent care showed fat pad displacement but no obvious fracture line consistent with occult fracture.  We will plan to repeat x-rays to investigate for fracture, and try to mobilize out of the sling as soon as able  based on pain.  Follow-up in 1 week for reevaluation.  No problem-specific Assessment & Plan notes found for this encounter.   No orders of the defined types were placed in this encounter.   No orders of the defined types were placed in this encounter.   Dagoberto Ligas, MD [facility] Fellow 08/22/2020 10:49 AM

## 2020-08-22 NOTE — Progress Notes (Signed)
[facility]: Attending Note: I have reviewed the chart, discussed wit the Sports Medicine Fellow. I agree with assessment and treatment plan as detailed in the [place] note. Fallonto elbo (LEFT) Prior hx of left elbow injurry (fracture per report)    Intact motor strength and distally has normal sensory function (although she reports chronic pins and needles in bilateral L.R) finger tips and thumb. reports previous NCS for CTS --normal Hx Dm.  X rays negative. She reports some improvement oin both pain and ROM. Lack 20 degrees full supination and it is still painful. Decreased extension by 20 degrees with pain but can extend fully. TTP olecranon diffusely.  Reviewed her previous X rays which show elbow effusion.   A/P Elbow pain after fall onto it X rays showing effusion so concern for occult fracture Wean out of sling as soon as possible. She is doing and should continue ROM exercises multiple times a day Short course tramadol for pain Repeat films today

## 2020-08-22 NOTE — Progress Notes (Signed)
Improved elbow effusion No fracture on these repeat films She has follow up next week

## 2020-08-29 ENCOUNTER — Other Ambulatory Visit: Payer: Self-pay

## 2020-08-29 ENCOUNTER — Ambulatory Visit (INDEPENDENT_AMBULATORY_CARE_PROVIDER_SITE_OTHER): Payer: Medicare Other | Admitting: Family Medicine

## 2020-08-29 DIAGNOSIS — M25522 Pain in left elbow: Secondary | ICD-10-CM | POA: Diagnosis not present

## 2020-08-29 NOTE — Patient Instructions (Addendum)
It was great to meet you today! Thank you for letting me participate in your care!  Today, we discussed your continued left elbow pain that is not really improved. I am glad the repeat x-rays did not show any fracture or displacement. I do want you coming out of the sling several times per day and moving the elbow. You may have also partially injured your distal biceps tendon so we will see you back in 2 more weeks and if you are improving we will get you in with PT.  You can continue to take Tramadol as needed for pain.  Be well, Harolyn Rutherford, DO PGY-4, Sports Medicine Fellow [place]

## 2020-08-29 NOTE — Progress Notes (Signed)
    SUBJECTIVE:   CHIEF COMPLAINT / HPI:   Left Elbow [name] is a very pleasant 69 year old female who presents to our clinic today for follow-up after having a fall and injuring her left elbow.  She has had two separate sets of x-rays of the left elbow both each not showing any fracture.  Most recent set of x-rays a week ago showed improving soft tissue edema at the posterior fat pad of the elbow.  She has kept it in a sling but has been coming out from it from time to time and is using sling for comfort.  She continues to have elbow pain and is having elbow stiffness.  Also she states now she has noticed onset of the pain occurring at the back she is having some pain at the cubital fossa of her elbow especially when she extends at the elbow.  Otherwise she is having no bruising no swelling and no discoloration of the skin.  PERTINENT  PMH / PSH: Atrial fibrillation, hypertension, coronary arthrosclerosis, sleep apnea, diabetes, hypothyroidism, CKD stage III, hyperlipidemia  OBJECTIVE:   BP (!) 106/54   Ht 5\' 3"  (1.6 m)   Wt 189 lb (85.7 kg)   LMP  (LMP Unknown)   BMI 33.48 kg/m   Elbow, Left: Inspection yields no evidence of bony deformity, effusion, erythema, ecchymosis, or rash. Active and passive ROM decreased in flexion and extension and with supination and pronation. Strength 5/5 throughout. No TTP at the medial or lateral epicondyle. Pain illicited with elbow flexion against resistance and TTP at the distal biceps tendon. Negative hook test.   ASSESSMENT/PLAN:   Elbow pain, left Patient still having elbow pain following a fall however with 2 x-rays with improving soft tissue edema and no evidence of fracture we will continue with conservative management.  Interestingly, she also has elements of distal biceps tendinitis which is new. -Advised patient to prevent further stiffness she should be coming out of the sling multiple times a day and moving elbow around and use sling for  comfort only or when she is out and about for protection. -Continue using over-the-counter pain medications as needed.  She already has tramadol and takes it very sparingly. -Follow-up with [place] in 2 weeks at which time we will start her in formal physical therapy to prevent any long-term loss of range of motion at the elbow.  I decided to delay formal PT start due to the distal biceps tenodesis and wanted this to have time to heal and get better before starting formal PT      Nuala Alpha, DO PGY-4, Sports Medicine Fellow [place]

## 2020-08-30 NOTE — Progress Notes (Signed)
[facility]: Attending Note: I have reviewed the chart, discussed wit the Sports Medicine Fellow. I agree with assessment and treatment plan as detailed in the Fellow's note.  

## 2020-08-31 DIAGNOSIS — M25522 Pain in left elbow: Secondary | ICD-10-CM | POA: Insufficient documentation

## 2020-08-31 NOTE — Assessment & Plan Note (Signed)
Patient still having elbow pain following a fall however with 2 x-rays with improving soft tissue edema and no evidence of fracture we will continue with conservative management.  Interestingly, she also has elements of distal biceps tendinitis which is new. -Advised patient to prevent further stiffness she should be coming out of the sling multiple times a day and moving elbow around and use sling for comfort only or when she is out and about for protection. -Continue using over-the-counter pain medications as needed.  She already has tramadol and takes it very sparingly. -Follow-up with [place] in 2 weeks at which time we will start her in formal physical therapy to prevent any long-term loss of range of motion at the elbow.  I decided to delay formal PT start due to the distal biceps tenodesis and wanted this to have time to heal and get better before starting formal PT

## 2020-09-04 ENCOUNTER — Other Ambulatory Visit: Payer: Self-pay

## 2020-09-04 ENCOUNTER — Ambulatory Visit (INDEPENDENT_AMBULATORY_CARE_PROVIDER_SITE_OTHER): Payer: Medicare Other

## 2020-09-04 DIAGNOSIS — Z23 Encounter for immunization: Secondary | ICD-10-CM

## 2020-09-04 NOTE — Progress Notes (Signed)
Patient presents in nurse clinic for Flu Vaccine.  Vaccine administered RD without complication.  See administration for details.

## 2020-09-08 DIAGNOSIS — G4733 Obstructive sleep apnea (adult) (pediatric): Secondary | ICD-10-CM | POA: Diagnosis not present

## 2020-09-12 ENCOUNTER — Other Ambulatory Visit: Payer: Self-pay

## 2020-09-12 ENCOUNTER — Encounter: Payer: Self-pay | Admitting: Sports Medicine

## 2020-09-12 ENCOUNTER — Ambulatory Visit (INDEPENDENT_AMBULATORY_CARE_PROVIDER_SITE_OTHER): Payer: Medicare Other | Admitting: Sports Medicine

## 2020-09-12 VITALS — BP 121/65 | Ht 63.0 in | Wt 189.0 lb

## 2020-09-12 DIAGNOSIS — M25522 Pain in left elbow: Secondary | ICD-10-CM

## 2020-09-12 NOTE — Patient Instructions (Signed)
Thank you for coming in to see [place] today!  I am glad your elbow is doing so much better.  Please continue to do home exercises to get full range of motion at her elbow and start introducing some light weights to help strengthen your elbow.  Please call the clinic at [phone] if your symptoms worsen or you have any concerns. It was our pleasure to serve you.       Dr. Dagoberto Ligas Dr. Yvetta Coder [facility]

## 2020-09-12 NOTE — Progress Notes (Signed)
   PCP: Benay Pike, MD  Subjective:   HPI: Patient is a 69 y.o. female here for follow up on L elbow injury.  She was initially seen here on 8/27 after falling on her outstretched hand.  X-rays performed in the ED on the day of injury did not show any fracture.  Follow-up x-rays to look for occult fracture again did not show fracture.  She was initially placed in a sling, and then gradually progressed out of it and started on her home therapy protocol for stiffness.  She has been taking tramadol as needed for the pain.  Since last visit, patient has had significant improvement in her symptoms.  She is having minimal pain and feels that her range of motion is almost always back to normal.  She continues to have some very mild lateral elbow pain and triceps tightness.  No new concerns.   Review of Systems:  Per HPI.   [place], medications and smoking status reviewed.      Objective:  Physical Exam:  [place] Adult Exercise 08/22/2020 08/29/2020 09/12/2020  Frequency of aerobic exercise (# of days/week) 3 2 2   Average time in minutes 30 30 30   Frequency of strengthening activities (# of days/week) - 0 0     Gen: awake, alert, NAD, comfortable in exam room Pulm: breathing unlabored  L Elbow Inspection: No erythema, ecchymosis, swelling edema.  Active ROM: Full range of motion to flexion, extension, supination, very mildly decreased pronation.  Strength: 5/5 strength to resisted flexion, extension  Tenderness: No bony tenderness to olecranon, medial epicondyle, lateral epicondyle, radial head, extensor bundle insertion, flexor bundle insertion  Special tests: Stable to varus/valgus stress. No pain with resisted wrist extension. No pain with resisted wrist flexion. No interosseous tenderness or pain with maneuvers    Assessment & Plan:  1.  Left elbow pain Patient with significant provement symptoms in near full return of range of motion at the elbow.  Initial plan was to  start formal PT at this visit, however given her significant improvement I think it would be okay for her to continue home PT.  Patient would also very much prefer not to go to formal PT.  Encouraged her to continue range of motion exercises and to start introducing some gentle weights with wrist flexion and extension.  Follow-up as needed.  Dagoberto Ligas, MD [facility] Fellow 09/12/2020 10:56 AM   Patient seen and evaluated with the sports medicine fellow.  I agree with the above plan of care.  Patient is doing very well.  Gradual resumption of activity as tolerated.  Follow-up for ongoing or recalcitrant issues.

## 2020-09-17 ENCOUNTER — Ambulatory Visit (INDEPENDENT_AMBULATORY_CARE_PROVIDER_SITE_OTHER): Payer: Medicare Other | Admitting: Family Medicine

## 2020-09-17 ENCOUNTER — Encounter: Payer: Self-pay | Admitting: Family Medicine

## 2020-09-17 ENCOUNTER — Other Ambulatory Visit: Payer: Self-pay

## 2020-09-17 DIAGNOSIS — G589 Mononeuropathy, unspecified: Secondary | ICD-10-CM | POA: Diagnosis not present

## 2020-09-17 NOTE — Progress Notes (Signed)
    SUBJECTIVE:   CHIEF COMPLAINT / HPI:   Paresthesia: Patient states that her paresthesia/anesthesia is improving and now only has numbness in the tip of the thumb.  She is taking her gabapentin 100 mg 3 times a day.  She would like to know if there is anything to do after taking the nerve conduction study.  She states that her elbow swelling has resolved after her recent fall.  Did not cause any worsening of her neuropathy.   PERTINENT  PMH / PSH: Mononeuropathy of the left hand, diabetes  OBJECTIVE:   BP 130/78   Pulse 83   Wt 189 lb (85.7 kg)   LMP  (LMP Unknown)   SpO2 96%   BMI 33.48 kg/m   MSK: 5/5 strength in the hands, wrists, triceps/biceps bilaterally.  No tenderness to palpation of the left elbow or wrist. Neuro: Sensation to pinprick intact in the fingers of the left hand.  ASSESSMENT/PLAN:   Peripheral neuropathy Symptoms improving, almost resolved completely.  Taking 100 mg gabapentin 3 times a day.  Nerve conduction study was normal.  Still uncertain of the cause of this.  Still unlikely to be related to diabetes as it is well controlled and not affecting her legs or other hand.  Discussed with patient how gabapentin is for symptomatic treatment and if she desires to come off of the gabapentin if her symptoms resolve, please let us know and we can discuss tapering.     Benay Pike, MD [place]

## 2020-09-17 NOTE — Patient Instructions (Addendum)
It was nice to see you again today,  Your nerve conduction studies were normal.  They did not show any ulnar or median nerve abnormalities.  I am glad that your symptoms are improving.  If your symptoms resolve completely and you want to taper off the gabapentin, please let us know.  Please schedule appointment in 3 months from now or sooner if needed.  Have a great day,  [name], MD

## 2020-09-22 NOTE — Assessment & Plan Note (Signed)
Symptoms improving, almost resolved completely.  Taking 100 mg gabapentin 3 times a day.  Nerve conduction study was normal.  Still uncertain of the cause of this.  Still unlikely to be related to diabetes as it is well controlled and not affecting her legs or other hand.  Discussed with patient how gabapentin is for symptomatic treatment and if she desires to come off of the gabapentin if her symptoms resolve, please let us know and we can discuss tapering.

## 2020-11-09 ENCOUNTER — Emergency Department (HOSPITAL_COMMUNITY): Payer: Medicare Other

## 2020-11-09 ENCOUNTER — Inpatient Hospital Stay (HOSPITAL_COMMUNITY)
Admission: EM | Admit: 2020-11-09 | Discharge: 2020-11-11 | DRG: 493 | Disposition: A | Discharge status: Home Health | Payer: Medicare Other | Attending: Internal Medicine | Admitting: Internal Medicine

## 2020-11-09 ENCOUNTER — Other Ambulatory Visit: Payer: Self-pay

## 2020-11-09 ENCOUNTER — Encounter (HOSPITAL_COMMUNITY): Payer: Self-pay

## 2020-11-09 DIAGNOSIS — S8252XA Displaced fracture of medial malleolus of left tibia, initial encounter for closed fracture: Secondary | ICD-10-CM | POA: Diagnosis not present

## 2020-11-09 DIAGNOSIS — K219 Gastro-esophageal reflux disease without esophagitis: Secondary | ICD-10-CM | POA: Diagnosis not present

## 2020-11-09 DIAGNOSIS — S82899A Other fracture of unspecified lower leg, initial encounter for closed fracture: Secondary | ICD-10-CM | POA: Diagnosis present

## 2020-11-09 DIAGNOSIS — Z96642 Presence of left artificial hip joint: Secondary | ICD-10-CM | POA: Diagnosis present

## 2020-11-09 DIAGNOSIS — R609 Edema, unspecified: Secondary | ICD-10-CM | POA: Diagnosis not present

## 2020-11-09 DIAGNOSIS — M25572 Pain in left ankle and joints of left foot: Secondary | ICD-10-CM | POA: Diagnosis present

## 2020-11-09 DIAGNOSIS — E039 Hypothyroidism, unspecified: Secondary | ICD-10-CM | POA: Diagnosis present

## 2020-11-09 DIAGNOSIS — Z7901 Long term (current) use of anticoagulants: Secondary | ICD-10-CM

## 2020-11-09 DIAGNOSIS — W101XXA Fall (on)(from) sidewalk curb, initial encounter: Secondary | ICD-10-CM | POA: Diagnosis present

## 2020-11-09 DIAGNOSIS — N179 Acute kidney failure, unspecified: Secondary | ICD-10-CM | POA: Diagnosis present

## 2020-11-09 DIAGNOSIS — N393 Stress incontinence (female) (male): Secondary | ICD-10-CM | POA: Diagnosis present

## 2020-11-09 DIAGNOSIS — W19XXXA Unspecified fall, initial encounter: Secondary | ICD-10-CM | POA: Diagnosis not present

## 2020-11-09 DIAGNOSIS — M19031 Primary osteoarthritis, right wrist: Secondary | ICD-10-CM | POA: Diagnosis present

## 2020-11-09 DIAGNOSIS — D62 Acute posthemorrhagic anemia: Secondary | ICD-10-CM | POA: Diagnosis not present

## 2020-11-09 DIAGNOSIS — E119 Type 2 diabetes mellitus without complications: Secondary | ICD-10-CM | POA: Diagnosis not present

## 2020-11-09 DIAGNOSIS — Z880 Allergy status to penicillin: Secondary | ICD-10-CM

## 2020-11-09 DIAGNOSIS — Z79899 Other long term (current) drug therapy: Secondary | ICD-10-CM

## 2020-11-09 DIAGNOSIS — Z9861 Coronary angioplasty status: Secondary | ICD-10-CM

## 2020-11-09 DIAGNOSIS — Z4789 Encounter for other orthopedic aftercare: Secondary | ICD-10-CM | POA: Diagnosis not present

## 2020-11-09 DIAGNOSIS — S8262XA Displaced fracture of lateral malleolus of left fibula, initial encounter for closed fracture: Secondary | ICD-10-CM | POA: Diagnosis not present

## 2020-11-09 DIAGNOSIS — Y9301 Activity, walking, marching and hiking: Secondary | ICD-10-CM | POA: Diagnosis present

## 2020-11-09 DIAGNOSIS — G4733 Obstructive sleep apnea (adult) (pediatric): Secondary | ICD-10-CM | POA: Diagnosis not present

## 2020-11-09 DIAGNOSIS — E1165 Type 2 diabetes mellitus with hyperglycemia: Secondary | ICD-10-CM | POA: Diagnosis not present

## 2020-11-09 DIAGNOSIS — E785 Hyperlipidemia, unspecified: Secondary | ICD-10-CM | POA: Diagnosis not present

## 2020-11-09 DIAGNOSIS — R52 Pain, unspecified: Secondary | ICD-10-CM

## 2020-11-09 DIAGNOSIS — Z87891 Personal history of nicotine dependence: Secondary | ICD-10-CM

## 2020-11-09 DIAGNOSIS — T148XXA Other injury of unspecified body region, initial encounter: Secondary | ICD-10-CM

## 2020-11-09 DIAGNOSIS — G8918 Other acute postprocedural pain: Secondary | ICD-10-CM | POA: Diagnosis not present

## 2020-11-09 DIAGNOSIS — M1711 Unilateral primary osteoarthritis, right knee: Secondary | ICD-10-CM | POA: Diagnosis not present

## 2020-11-09 DIAGNOSIS — S8292XD Unspecified fracture of left lower leg, subsequent encounter for closed fracture with routine healing: Secondary | ICD-10-CM | POA: Diagnosis not present

## 2020-11-09 DIAGNOSIS — S82852A Displaced trimalleolar fracture of left lower leg, initial encounter for closed fracture: Principal | ICD-10-CM | POA: Diagnosis present

## 2020-11-09 DIAGNOSIS — I482 Chronic atrial fibrillation, unspecified: Secondary | ICD-10-CM | POA: Diagnosis not present

## 2020-11-09 DIAGNOSIS — Z9181 History of falling: Secondary | ICD-10-CM

## 2020-11-09 DIAGNOSIS — S0990XA Unspecified injury of head, initial encounter: Secondary | ICD-10-CM | POA: Diagnosis not present

## 2020-11-09 DIAGNOSIS — I252 Old myocardial infarction: Secondary | ICD-10-CM | POA: Diagnosis not present

## 2020-11-09 DIAGNOSIS — M19032 Primary osteoarthritis, left wrist: Secondary | ICD-10-CM | POA: Diagnosis not present

## 2020-11-09 DIAGNOSIS — M19042 Primary osteoarthritis, left hand: Secondary | ICD-10-CM | POA: Diagnosis present

## 2020-11-09 DIAGNOSIS — Z833 Family history of diabetes mellitus: Secondary | ICD-10-CM

## 2020-11-09 DIAGNOSIS — Z885 Allergy status to narcotic agent status: Secondary | ICD-10-CM

## 2020-11-09 DIAGNOSIS — Z743 Need for continuous supervision: Secondary | ICD-10-CM | POA: Diagnosis not present

## 2020-11-09 DIAGNOSIS — Z20822 Contact with and (suspected) exposure to covid-19: Secondary | ICD-10-CM | POA: Diagnosis present

## 2020-11-09 DIAGNOSIS — I2510 Atherosclerotic heart disease of native coronary artery without angina pectoris: Secondary | ICD-10-CM | POA: Diagnosis not present

## 2020-11-09 DIAGNOSIS — I4891 Unspecified atrial fibrillation: Secondary | ICD-10-CM | POA: Diagnosis present

## 2020-11-09 DIAGNOSIS — I4819 Other persistent atrial fibrillation: Secondary | ICD-10-CM | POA: Diagnosis present

## 2020-11-09 DIAGNOSIS — N183 Chronic kidney disease, stage 3 unspecified: Secondary | ICD-10-CM | POA: Diagnosis not present

## 2020-11-09 DIAGNOSIS — I129 Hypertensive chronic kidney disease with stage 1 through stage 4 chronic kidney disease, or unspecified chronic kidney disease: Secondary | ICD-10-CM | POA: Diagnosis not present

## 2020-11-09 DIAGNOSIS — E860 Dehydration: Secondary | ICD-10-CM | POA: Diagnosis present

## 2020-11-09 DIAGNOSIS — E1122 Type 2 diabetes mellitus with diabetic chronic kidney disease: Secondary | ICD-10-CM | POA: Diagnosis present

## 2020-11-09 DIAGNOSIS — T1490XA Injury, unspecified, initial encounter: Secondary | ICD-10-CM

## 2020-11-09 DIAGNOSIS — Z8249 Family history of ischemic heart disease and other diseases of the circulatory system: Secondary | ICD-10-CM

## 2020-11-09 DIAGNOSIS — S8252XD Displaced fracture of medial malleolus of left tibia, subsequent encounter for closed fracture with routine healing: Secondary | ICD-10-CM | POA: Diagnosis not present

## 2020-11-09 DIAGNOSIS — R531 Weakness: Secondary | ICD-10-CM | POA: Diagnosis not present

## 2020-11-09 DIAGNOSIS — S82832D Other fracture of upper and lower end of left fibula, subsequent encounter for closed fracture with routine healing: Secondary | ICD-10-CM | POA: Diagnosis not present

## 2020-11-09 DIAGNOSIS — M19041 Primary osteoarthritis, right hand: Secondary | ICD-10-CM | POA: Diagnosis present

## 2020-11-09 DIAGNOSIS — G629 Polyneuropathy, unspecified: Secondary | ICD-10-CM | POA: Diagnosis not present

## 2020-11-09 DIAGNOSIS — Z8719 Personal history of other diseases of the digestive system: Secondary | ICD-10-CM | POA: Diagnosis not present

## 2020-11-09 DIAGNOSIS — I10 Essential (primary) hypertension: Secondary | ICD-10-CM | POA: Diagnosis not present

## 2020-11-09 DIAGNOSIS — Z79890 Hormone replacement therapy (postmenopausal): Secondary | ICD-10-CM

## 2020-11-09 DIAGNOSIS — Z7984 Long term (current) use of oral hypoglycemic drugs: Secondary | ICD-10-CM

## 2020-11-09 LAB — PROTIME-INR
INR: 1.4 — ABNORMAL HIGH (ref 0.8–1.2)
Prothrombin Time: 16.9 seconds — ABNORMAL HIGH (ref 11.4–15.2)

## 2020-11-09 LAB — BASIC METABOLIC PANEL
Anion gap: 12 (ref 5–15)
BUN: 18 mg/dL (ref 8–23)
CO2: 21 mmol/L — ABNORMAL LOW (ref 22–32)
Calcium: 9.5 mg/dL (ref 8.9–10.3)
Chloride: 103 mmol/L (ref 98–111)
Creatinine, Ser: 1.24 mg/dL — ABNORMAL HIGH (ref 0.44–1.00)
GFR, Estimated: 47 mL/min — ABNORMAL LOW (ref >60)
Glucose, Bld: 107 mg/dL — ABNORMAL HIGH (ref 70–99)
Potassium: 4.5 mmol/L (ref 3.5–5.1)
Sodium: 136 mmol/L (ref 135–145)

## 2020-11-09 LAB — CBC WITH DIFFERENTIAL/PLATELET
Abs Immature Granulocytes: 0.06 10*3/uL (ref 0.00–0.07)
Basophils Absolute: 0 10*3/uL (ref 0.0–0.1)
Basophils Relative: 0 %
Eosinophils Absolute: 0.2 10*3/uL (ref 0.0–0.5)
Eosinophils Relative: 2 %
HCT: 38.4 % (ref 36.0–46.0)
Hemoglobin: 12 g/dL (ref 12.0–15.0)
Immature Granulocytes: 1 %
Lymphocytes Relative: 28 %
Lymphs Abs: 3.5 10*3/uL (ref 0.7–4.0)
MCH: 29 pg (ref 26.0–34.0)
MCHC: 31.3 g/dL (ref 30.0–36.0)
MCV: 92.8 fL (ref 80.0–100.0)
Monocytes Absolute: 1.5 10*3/uL — ABNORMAL HIGH (ref 0.1–1.0)
Monocytes Relative: 12 %
Neutro Abs: 7.1 10*3/uL (ref 1.7–7.7)
Neutrophils Relative %: 57 %
Platelets: 280 10*3/uL (ref 150–400)
RBC: 4.14 MIL/uL (ref 3.87–5.11)
RDW: 14.4 % (ref 11.5–15.5)
WBC: 12.3 10*3/uL — ABNORMAL HIGH (ref 4.0–10.5)
nRBC: 0 % (ref 0.0–0.2)

## 2020-11-09 LAB — RESPIRATORY PANEL BY RT PCR (FLU A&B, COVID)
Influenza A by PCR: NEGATIVE
Influenza B by PCR: NEGATIVE
SARS Coronavirus 2 by RT PCR: NEGATIVE

## 2020-11-09 MED ORDER — DOCUSATE SODIUM 100 MG PO CAPS
100.0000 mg | ORAL_CAPSULE | Freq: Two times a day (BID) | ORAL | Status: DC
  Administered 2020-11-09 – 2020-11-11 (×3): 100 mg via ORAL
  Filled 2020-11-09 (×3): qty 1

## 2020-11-09 MED ORDER — FENTANYL CITRATE (PF) 100 MCG/2ML IJ SOLN
INTRAMUSCULAR | Status: AC
Start: 2020-11-09 — End: 2020-11-10
  Filled 2020-11-09: qty 2

## 2020-11-09 MED ORDER — ONDANSETRON HCL 4 MG PO TABS: 4.0000 mg | ORAL_TABLET | Freq: Four times a day (QID) | ORAL | Status: DC | PRN

## 2020-11-09 MED ORDER — ONDANSETRON HCL 4 MG/2ML IJ SOLN: 4.0000 mg | Freq: Four times a day (QID) | INTRAMUSCULAR | Status: DC | PRN

## 2020-11-09 MED ORDER — PROPOFOL 10 MG/ML IV BOLUS
INTRAVENOUS | Status: AC
  Administered 2020-11-09: 50 mg via INTRAVENOUS
  Filled 2020-11-09: qty 20

## 2020-11-09 MED ORDER — PROPOFOL 10 MG/ML IV BOLUS: 0.5000 mg/kg | Freq: Once | INTRAVENOUS | Status: AC

## 2020-11-09 MED ORDER — ROSUVASTATIN CALCIUM 20 MG PO TABS
40.0000 mg | ORAL_TABLET | Freq: Every day | ORAL | Status: DC
  Administered 2020-11-09 – 2020-11-10 (×2): 40 mg via ORAL
  Filled 2020-11-09: qty 2
  Filled 2020-11-09: qty 8

## 2020-11-09 MED ORDER — POTASSIUM CHLORIDE IN NACL 20-0.9 MEQ/L-% IV SOLN
INTRAVENOUS | Status: DC
  Filled 2020-11-09 (×2): qty 1000

## 2020-11-09 MED ORDER — PROPOFOL 10 MG/ML IV BOLUS
INTRAVENOUS | Status: AC | PRN
  Administered 2020-11-09: 25 mg via INTRAVENOUS

## 2020-11-09 MED ORDER — DILTIAZEM HCL ER COATED BEADS 120 MG PO CP24
240.0000 mg | ORAL_CAPSULE | Freq: Every day | ORAL | Status: DC
  Administered 2020-11-09 – 2020-11-10 (×2): 240 mg via ORAL
  Filled 2020-11-09 (×2): qty 2

## 2020-11-09 MED ORDER — MORPHINE SULFATE (PF) 2 MG/ML IV SOLN
0.5000 mg | INTRAVENOUS | Status: DC | PRN
  Administered 2020-11-10 (×2): 0.5 mg via INTRAVENOUS
  Filled 2020-11-09 (×2): qty 1

## 2020-11-09 MED ORDER — ONDANSETRON HCL 4 MG/2ML IJ SOLN
INTRAMUSCULAR | Status: AC
  Administered 2020-11-09: 4 mg via INTRAVENOUS
  Filled 2020-11-09: qty 2

## 2020-11-09 MED ORDER — HYDROCODONE-ACETAMINOPHEN 7.5-325 MG PO TABS
1.0000 | ORAL_TABLET | ORAL | Status: DC | PRN
  Filled 2020-11-09: qty 2

## 2020-11-09 MED ORDER — SODIUM CHLORIDE 0.9 % IV BOLUS
1000.0000 mL | Freq: Once | INTRAVENOUS | Status: AC
  Administered 2020-11-09: 1000 mL via INTRAVENOUS

## 2020-11-09 MED ORDER — GABAPENTIN 100 MG PO CAPS: 100.0000 mg | ORAL_CAPSULE | Freq: Two times a day (BID) | ORAL | Status: DC

## 2020-11-09 MED ORDER — FENTANYL CITRATE (PF) 100 MCG/2ML IJ SOLN
50.0000 ug | Freq: Once | INTRAMUSCULAR | Status: AC
  Administered 2020-11-09: 50 ug via INTRAVENOUS
  Filled 2020-11-09: qty 2

## 2020-11-09 MED ORDER — ENOXAPARIN SODIUM 40 MG/0.4ML ~~LOC~~ SOLN: 40.0000 mg | SUBCUTANEOUS | Status: DC

## 2020-11-09 MED ORDER — METOCLOPRAMIDE HCL 5 MG/ML IJ SOLN: 5.0000 mg | Freq: Three times a day (TID) | INTRAMUSCULAR | Status: DC | PRN

## 2020-11-09 MED ORDER — FENTANYL CITRATE (PF) 100 MCG/2ML IJ SOLN
50.0000 ug | Freq: Once | INTRAMUSCULAR | Status: AC
  Administered 2020-11-09: 50 ug via INTRAVENOUS

## 2020-11-09 MED ORDER — LEVOTHYROXINE SODIUM 100 MCG PO TABS
100.0000 ug | ORAL_TABLET | Freq: Every day | ORAL | Status: DC
  Administered 2020-11-11: 100 ug via ORAL
  Filled 2020-11-09 (×2): qty 1

## 2020-11-09 MED ORDER — METHOCARBAMOL 500 MG PO TABS
500.0000 mg | ORAL_TABLET | Freq: Four times a day (QID) | ORAL | Status: DC | PRN
  Filled 2020-11-09: qty 1

## 2020-11-09 MED ORDER — HYDROCODONE-ACETAMINOPHEN 5-325 MG PO TABS
1.0000 | ORAL_TABLET | ORAL | Status: DC | PRN
  Administered 2020-11-09: 1 via ORAL
  Filled 2020-11-09: qty 1

## 2020-11-09 MED ORDER — ONDANSETRON HCL 4 MG/2ML IJ SOLN: 4.0000 mg | Freq: Once | INTRAMUSCULAR | Status: AC

## 2020-11-09 MED ORDER — ACETAMINOPHEN 325 MG PO TABS: 325.0000 mg | ORAL_TABLET | Freq: Four times a day (QID) | ORAL | Status: DC | PRN

## 2020-11-09 MED ORDER — METHOCARBAMOL 1000 MG/10ML IJ SOLN
500.0000 mg | Freq: Four times a day (QID) | INTRAVENOUS | Status: DC | PRN
  Filled 2020-11-09: qty 5

## 2020-11-09 MED ORDER — METOCLOPRAMIDE HCL 10 MG PO TABS: 5.0000 mg | ORAL_TABLET | Freq: Three times a day (TID) | ORAL | Status: DC | PRN

## 2020-11-09 MED ORDER — LORATADINE 10 MG PO TABS: 10.0000 mg | ORAL_TABLET | Freq: Every day | ORAL | Status: DC | PRN

## 2020-11-09 NOTE — ED Notes (Signed)
NPO status: pt last ate at 0800

## 2020-11-09 NOTE — ED Triage Notes (Signed)
Pt bib [facility] from home w/ c/o fall after tripping over curb. Pt has obvious deformity to L ankle. Pt currently taking eliquis, did not hit head. No loc, aox4, neuro intact. Pt has +2 pedal pulses. Pt reports 10/10 pain. EMS VSS.

## 2020-11-09 NOTE — Sedation Documentation (Signed)
Ortho at bedside splinting ankle

## 2020-11-09 NOTE — Progress Notes (Signed)
Orthopedic Tech Progress Note Patient Details:  [name] 1951/07/30 [id number]  Ortho Devices Type of Ortho Device: Post (short leg) splint, Stirrup splint Splint Material: Plaster Ortho Device/Splint Location: Left Lower Extremity Ortho Device/Splint Interventions: Ordered, Application   Post Interventions Patient Tolerated: Well Instructions Provided: Adjustment of device, Care of device, Poper ambulation with device  Applied with the assistance of DO [place] 11/09/2020, 5:51 PM

## 2020-11-09 NOTE — Sedation Documentation (Signed)
Xray at bedside, post procedure xray completed

## 2020-11-09 NOTE — ED Provider Notes (Signed)
[facility] EMERGENCY DEPARTMENT Provider Note   CSN: [id number] Arrival date & time: 11/09/20  1411     History Chief Complaint  Patient presents with  . Fall    [name] is a 69 y.o. female.  Patient stepped wrong while stepping off of a curb and heard a pop in her left ankle with obvious deformity. Did not hit her head. She is on blood thinner. Is able to move her lower extremity but having discomfort in the left ankle.  The history is provided by the patient.  Fall This is a new problem. The current episode started 1 to 2 hours ago. The problem has been resolved. Pertinent negatives include no chest pain, no abdominal pain, no headaches and no shortness of breath. Nothing aggravates the symptoms. Nothing relieves the symptoms. She has tried nothing for the symptoms. The treatment provided no relief.       Past Medical History:  Diagnosis Date  . Anticoagulated on Coumadin   . Arthritis    wrist, hands -  . Atrial fibrillation, persistent ([place])   . Bladder tumor   . CAD (coronary artery disease) cardiologist-  dr hochrien   PTCA RCA 1999  . Cataract   . Diabetes ([place])   . Full dentures   . GERD (gastroesophageal reflux disease)   . History of colon polyps    2005  hyperplastic  . History of MI (myocardial infarction)    1999  . HLD (hyperlipidemia)   . Hypertension   . Hypothyroidism   . SUI (stress urinary incontinence, female)   . Wears glasses     Patient Active Problem List   Diagnosis Date Noted  . Elbow pain, left 08/31/2020  . Conductive hearing loss, bilateral 07/25/2020  . Peripheral neuropathy 06/15/2020  . Chronic kidney disease (CKD), stage III (moderate) ([place]) 12/06/2019  . Dyslipidemia 11/25/2019  . Osteoarthritis of right knee 07/11/2019  . Swollen R ankle 07/11/2019  . Microalbuminuria 12/07/2018  . OSA (obstructive sleep apnea) 03/16/2018  . History of bladder cancer 08/24/2017  . Claudication ([place]) 07/21/2017   . Onychomycosis of toenail 06/09/2017  . Long-term (current) use of anticoagulants 02/19/2015  . Diabetes ([place]) 04/11/2014  . Atrial fibrillation ([place]) 12/18/2013  . Urinary incontinence 05/06/2011  . HLD (hyperlipidemia) 04/29/2009  . Obesity 04/29/2009  . Essential hypertension, benign 04/29/2009  . Coronary atherosclerosis 04/29/2009  . Hypothyroidism 02/23/2007  . MYOCARDIAL INFARCTION, OLD 02/23/2007  . Personal history of colonic adenoma 11/11/2004    Past Surgical History:  Procedure Laterality Date  . CARDIOVASCULAR STRESS TEST  05-01-2009   dr hochrein   normal nuclear study/  no ischemia or scar/  normal LV function and wall motion, ef 71%  . COLONOSCOPY     multiple  . CORONARY ANGIOPLASTY  Feb 1999   PTCA to RCA  . D & C HYSTEROSCOPY/  POLYPECTOMY  05-08-2009  . HYSTEROSCOPY WITH D & C  12/07/2012   Procedure: DILATATION AND CURETTAGE /HYSTEROSCOPY;  Surgeon: Elveria Royals, MD;  Location: [facility];  Service: Gynecology;  Laterality: N/A;  Dilitation and curettage /hysteroscopy/biopsey of lower segment of uterus  . TOTAL HIP ARTHROPLASTY Left 12-11-2010  . TRANSTHORACIC ECHOCARDIOGRAM  09-11-2015   mild LVH, ef 55%,  mildly reduced RVSF,  trivial TR  . TRANSURETHRAL RESECTION OF BLADDER TUMOR N/A 12/01/2015   Procedure: TRANSURETHRAL RESECTION OF BLADDER TUMOR (TURBT);  Surgeon: Kathie Rhodes, MD;  Location: [facility];  Service: Urology;  Laterality:  N/A;  . TUBAL LIGATION  1974     OB History   No obstetric history on file.     Family History  Problem Relation Age of Onset  . Diabetes Mother   . Heart disease Mother   . Colon cancer Mother 49  . Heart disease Father   . Cancer Sister        Lung  . Cancer Brother        intestinal  . Atrial fibrillation Brother   . Cancer Brother        throat ca  . Cancer Brother        bladder  . Breast cancer Neg Hx     Social History   Tobacco Use  . Smoking status: Former Smoker     Packs/day: 1.50    Years: 15.00    Pack years: 22.50    Types: Cigarettes    Quit date: 11/09/1981    Years since quitting: 39.0  . Smokeless tobacco: Never Used  . Tobacco comment: no plans to start  Vaping Use  . Vaping Use: Never used  Substance Use Topics  . Alcohol use: No  . Drug use: No    Home Medications Prior to Admission medications   Medication Sig Start Date End Date Taking? Authorizing Provider  calcium carbonate (TUMS - DOSED IN MG ELEMENTAL CALCIUM) 500 MG chewable tablet Chew 1 tablet by mouth as needed for indigestion or heartburn.    [provider]  diclofenac sodium (VOLTAREN) 1 % GEL Apply 4 g topically 4 (four) times daily. 05/18/19   Glenis Smoker, MD  diltiazem (CARDIZEM CD) 240 MG 24 hr capsule TAKE 1 CAPSULE BY MOUTH EVERY DAY 09/10/19   Minus Breeding, MD  ELIQUIS 5 MG TABS tablet TAKE 1 TABLET(5 MG) BY MOUTH TWICE DAILY 07/01/20   Minus Breeding, MD  gabapentin (NEURONTIN) 100 MG capsule 100mg  in AM and afternoon, and 300mg  at night. 07/02/20   Benay Pike, MD  HYDROcodone-acetaminophen (NORCO/VICODIN) 5-325 MG tablet Take 1-2 tablets by mouth every 6 (six) hours as needed. 08/19/20   Loura Halt A, NP  levothyroxine (SYNTHROID) 100 MCG tablet Take 1 tablet (100 mcg total) by mouth daily. 02/27/20   Benay Pike, MD  lisinopril (ZESTRIL) 2.5 MG tablet Take 1 tablet (2.5 mg total) by mouth daily. 03/11/20   Benay Pike, MD  loratadine (CLARITIN) 10 MG tablet Take 10 mg by mouth daily as needed for allergies.    [provider]  metFORMIN (GLUCOPHAGE-XR) 500 MG 24 hr tablet Take 1 tablet (500 mg total) by mouth 2 (two) times daily. 01/07/20   Benay Pike, MD  [facility] DELICA LANCETS FINE MISC TEST TWICE [phone] AND 10PM) 10/03/17   Glenis Smoker, MD  rosuvastatin (CRESTOR) 40 MG tablet TAKE 1 TABLET(40 MG) BY MOUTH DAILY 06/04/20   Minus Breeding, MD  traMADol (ULTRAM) 50 MG tablet Take 1 tablet (50 mg total) by mouth  every 12 (twelve) hours as needed. 08/22/20   Dickie La, MD  diltiazem (DILACOR XR) 120 MG 24 hr capsule Take 1 capsule (120 mg total) by mouth daily. 09/15/11 05/15/12  Minus Breeding, MD    Allergies    Penicillins and Percocet [oxycodone-acetaminophen]  Review of Systems   Review of Systems  Constitutional: Negative for chills and fever.  HENT: Negative for ear pain and sore throat.   Eyes: Negative for pain and visual disturbance.  Respiratory: Negative for cough and shortness  of breath.   Cardiovascular: Negative for chest pain and palpitations.  Gastrointestinal: Negative for abdominal pain and vomiting.  Genitourinary: Negative for dysuria and hematuria.  Musculoskeletal: Positive for arthralgias. Negative for back pain.  Skin: Negative for color change and rash.  Neurological: Negative for seizures, syncope and headaches.  All other systems reviewed and are negative.   Physical Exam Updated Vital Signs  ED Triage Vitals  Enc Vitals Group     BP 11/09/20 1420 120/66     Pulse Rate 11/09/20 1420 89     Resp 11/09/20 1437 16     Temp 11/09/20 1418 98.2 F (36.8 C)     Temp Source 11/09/20 1418 Oral     SpO2 11/09/20 1420 95 %     Weight 11/09/20 1605 189 lb (85.7 kg)     Height 11/09/20 1605 5\' 3"  (1.6 m)     Head Circumference --      Peak Flow --      Pain Score 11/09/20 1416 10     Pain Loc --      Pain Edu? --      Excl. in [place]? --     Physical Exam Vitals and nursing note reviewed.  Constitutional:      General: She is not in acute distress.    Appearance: She is well-developed. She is not ill-appearing.  HENT:     Head: Normocephalic and atraumatic.  Eyes:     Extraocular Movements: Extraocular movements intact.     Conjunctiva/sclera: Conjunctivae normal.     Pupils: Pupils are equal, round, and reactive to light.  Cardiovascular:     Rate and Rhythm: Normal rate and regular rhythm.     Pulses: Normal pulses.     Heart sounds: Normal heart sounds.  No murmur heard.   Pulmonary:     Effort: Pulmonary effort is normal. No respiratory distress.     Breath sounds: Normal breath sounds.  Abdominal:     Palpations: Abdomen is soft.     Tenderness: There is no abdominal tenderness.  Musculoskeletal:        General: Tenderness and deformity present.     Cervical back: Normal range of motion and neck supple. No tenderness.     Comments: Tenderness and deformity to the left ankle, there is some skin tenting to the medial malleolus on the side but there is no breakdown of the skin at this point  Skin:    General: Skin is warm and dry.  Neurological:     General: No focal deficit present.     Mental Status: She is alert.     Sensory: No sensory deficit.     Motor: No weakness.     ED Results / Procedures / Treatments   Labs (all labs ordered are listed, but only abnormal results are displayed) Labs Reviewed  CBC WITH DIFFERENTIAL/PLATELET - Abnormal; Notable for the following components:      Result Value   WBC 12.3 (*)    Monocytes Absolute 1.5 (*)    All other components within normal limits  BASIC METABOLIC PANEL - Abnormal; Notable for the following components:   CO2 21 (*)    Glucose, Bld 107 (*)    Creatinine, Ser 1.24 (*)    GFR, Estimated 47 (*)    All other components within normal limits  RESPIRATORY PANEL BY RT PCR (FLU A&B, COVID)    EKG None  Radiology DG Ankle Complete Left  Result Date: 11/09/2020 CLINICAL  DATA:  Twisting injury, fall, pain EXAM: LEFT FOOT - COMPLETE 3+ VIEW; LEFT ANKLE COMPLETE - 3+ VIEW COMPARISON:  None. FINDINGS: Left ankle: Frontal, oblique, lateral views demonstrate trimalleolar fracture dislocation of the left ankle. There is an oblique mildly comminuted lateral malleolar fracture with significant lateral translation and angulation. Coronal E oriented posterior malleolar fracture is displaced posteriorly. Transverse medial malleolar fracture with significant lateral translation. There is  lateral dislocation of the talus from the tibial plafond. Diffuse soft tissue edema. Left foot: Frontal, oblique, and lateral views of the left foot are obtained. Trimalleolar fracture dislocation of the left ankle again noted. No fractures of the left foot. Diffuse soft tissue swelling of the ankle and hindfoot. IMPRESSION: 1. Trimalleolar fracture dislocation of the left ankle as above, with lateral dislocation of the talus from the tibial plafond. 2. Diffuse soft tissue swelling of the hindfoot and ankle. Electronically Signed   By: Randa Ngo M.D.   On: 11/09/2020 15:33   CT Head Wo Contrast  Result Date: 11/09/2020 CLINICAL DATA:  Head trauma. EXAM: CT HEAD WITHOUT CONTRAST TECHNIQUE: Contiguous axial images were obtained from the base of the skull through the vertex without intravenous contrast. COMPARISON:  May 07, 2018 FINDINGS: Brain: No evidence of acute infarction, hemorrhage, hydrocephalus, extra-axial collection or mass lesion/mass effect. Mild brain parenchymal volume loss and deep white matter microangiopathy. Vascular: Calcific atherosclerotic disease of the intra cavernous carotid arteries. Skull: Normal. Negative for fracture or focal lesion. Sinuses/Orbits: No acute finding. Other: None. IMPRESSION: 1. No acute intracranial abnormality. 2. Mild brain parenchymal atrophy and chronic microvascular disease. Electronically Signed   By: Fidela Salisbury M.D.   On: 11/09/2020 15:45   DG Foot Complete Left  Result Date: 11/09/2020 CLINICAL DATA:  Twisting injury, fall, pain EXAM: LEFT FOOT - COMPLETE 3+ VIEW; LEFT ANKLE COMPLETE - 3+ VIEW COMPARISON:  None. FINDINGS: Left ankle: Frontal, oblique, lateral views demonstrate trimalleolar fracture dislocation of the left ankle. There is an oblique mildly comminuted lateral malleolar fracture with significant lateral translation and angulation. Coronal E oriented posterior malleolar fracture is displaced posteriorly. Transverse medial  malleolar fracture with significant lateral translation. There is lateral dislocation of the talus from the tibial plafond. Diffuse soft tissue edema. Left foot: Frontal, oblique, and lateral views of the left foot are obtained. Trimalleolar fracture dislocation of the left ankle again noted. No fractures of the left foot. Diffuse soft tissue swelling of the ankle and hindfoot. IMPRESSION: 1. Trimalleolar fracture dislocation of the left ankle as above, with lateral dislocation of the talus from the tibial plafond. 2. Diffuse soft tissue swelling of the hindfoot and ankle. Electronically Signed   By: Randa Ngo M.D.   On: 11/09/2020 15:33    Procedures .Sedation  Date/Time: 11/09/2020 3:35 PM Performed by: Lennice Sites, DO Authorized by: Lennice Sites, DO   Consent:    Consent obtained:  Verbal   Consent given by:  Patient   Risks discussed:  Allergic reaction, dysrhythmia, inadequate sedation, nausea, prolonged hypoxia resulting in organ damage, prolonged sedation necessitating reversal, respiratory compromise necessitating ventilatory assistance and intubation and vomiting   Alternatives discussed:  Analgesia without sedation, anxiolysis and regional anesthesia Universal protocol:    Procedure explained and questions answered to patient or proxy's satisfaction: yes     Relevant documents present and verified: yes     Test results available and properly labeled: yes     Imaging studies available: yes     Required blood products, implants, devices, and  special equipment available: yes     Site/side marked: yes     Immediately prior to procedure a time out was called: yes     Patient identity confirmation method:  Verbally with patient Indications:    Procedure necessitating sedation performed by:  Physician performing sedation Pre-sedation assessment:    Time since last food or drink:  8 hours   ASA classification: class 2 - patient with mild systemic disease     Neck mobility:  normal     Mouth opening:  3 or more finger widths   Thyromental distance:  4 finger widths   Mallampati score:  I - soft palate, uvula, fauces, pillars visible   Pre-sedation assessments completed and reviewed: airway patency, cardiovascular function, hydration status, mental status, nausea/vomiting, pain level, respiratory function and temperature     Pre-sedation assessment completed:  11/09/2020 3:36 PM Immediate pre-procedure details:    Reassessment: Patient reassessed immediately prior to procedure     Reviewed: vital signs, relevant labs/tests and NPO status     Verified: bag valve mask available, emergency equipment available, intubation equipment available, IV patency confirmed, oxygen available and suction available   Procedure details (see MAR for exact dosages):    Preoxygenation:  Nasal cannula   Sedation:  Propofol   Intended level of sedation: deep   Intra-procedure monitoring:  Blood pressure monitoring, cardiac monitor, continuous pulse oximetry, frequent LOC assessments, frequent vital sign checks and continuous capnometry   Intra-procedure events: none     Total Provider sedation time (minutes):  25 Post-procedure details:    Post-sedation assessment completed:  11/09/2020 5:21 PM   Attendance: Constant attendance by certified staff until patient recovered     Recovery: Patient returned to pre-procedure baseline     Post-sedation assessments completed and reviewed: airway patency, cardiovascular function, hydration status, mental status, nausea/vomiting, pain level, respiratory function and temperature     Patient is stable for discharge or admission: yes     Patient tolerance:  Tolerated well, no immediate complications  .Ortho Injury Treatment  Date/Time: 11/09/2020 5:21 PM Performed by: Lennice Sites, DO Authorized by: Lennice Sites, DO   Consent:    Consent obtained:  Written   Consent given by:  Patient   Risks discussed:  Fracture, irreducible  dislocation, nerve damage, recurrent dislocation, restricted joint movement, stiffness and vascular damage   Alternatives discussed:  No treatmentInjury location: ankle Location details: left ankle Injury type: fracture-dislocation Fracture type: trimalleolar Pre-procedure neurovascular assessment: neurovascularly intact Pre-procedure distal perfusion: normal Pre-procedure neurological function: normal Pre-procedure range of motion: reduced  Patient sedated: Yes. Refer to sedation procedure documentation for details of sedation. Manipulation performed: yes Reduction successful: yes X-ray confirmed reduction: yes Immobilization: splint Splint type: short leg Supplies used: plaster Post-procedure neurovascular assessment: post-procedure neurovascularly intact Post-procedure distal perfusion: normal Post-procedure neurological function: normal Patient tolerance: patient tolerated the procedure well with no immediate complications    (including critical care time)  Medications Ordered in ED Medications  fentaNYL (SUBLIMAZE) injection 50 mcg (50 mcg Intravenous Not Given 11/09/20 1630)  propofol (DIPRIVAN) 10 mg/mL bolus/IV push 42.9 mg (50 mg Intravenous Given 11/09/20 1649)  ondansetron (ZOFRAN) injection 4 mg (4 mg Intravenous Given 11/09/20 1641)  sodium chloride 0.9 % bolus 1,000 mL (1,000 mLs Intravenous New Bag/Given 11/09/20 1614)  propofol (DIPRIVAN) 10 mg/mL bolus/IV push (25 mg Intravenous Given 11/09/20 1654)    ED Course  I have reviewed the triage vital signs and the nursing notes.  Pertinent labs & imaging results  that were available during my care of the patient were reviewed by me and considered in my medical decision making (see chart for details).    MDM Rules/Calculators/A&P                          [name] is a 69 year old female with history of A. fib on Eliquis who presents to the ED after mechanical fall.  Deformity to left ankle.  Did not think  that she hit her head.  Vital signs overall unremarkable.  CT scan of the head is unremarkable.  Neurologically she is intact.  X-ray of the left ankle confirms trimalleolar fracture.  She is neurovascularly neuromuscularly intact.  Propofol sedation was performed with successful reduction of fracture.  She was neurovascularly neuromuscularly intact after reduction and splint was placed by myself.  X-ray confirmed successful reduction.  Talked with Dr. Doreatha Martin with orthopedics who will evaluate the patient tomorrow for possible surgery.  Given poor ambulation and high risk of falling at home will admit for further pain control, PT and OT and possibly surgical intervention depending on the swelling tomorrow.  To be admitted to medicine in stable condition.  Medical screening labs unremarkable.  This chart was dictated using voice recognition software.  Despite best efforts to proofread,  errors can occur which can change the documentation meaning.    Final Clinical Impression(s) / ED Diagnoses Final diagnoses:  Pain  Closed trimalleolar fracture of left ankle, initial encounter    Rx / DC Orders ED Discharge Orders    None       Lennice Sites, DO 11/09/20 1724

## 2020-11-09 NOTE — Consult Note (Signed)
Orthopaedic Trauma Service (OTS) Consult   Patient ID: [name] MRN: [id number] DOB/AGE: [birth year] 69 y.o.  Reason for Consult: Left ankle injury Referring Physician: Dr. Ermalinda Memos, MD [name])  HPI: [name] is an 69 y.o. female being seen in consultation at the request of Dr. Vernell Barrier for left ankle injury. Patient stepped off a curb earlier today and felt/heard a pop in her left ankle. Had immediate pain in the left ankle and was unable to bear weight. Was brought to [facility] ED where imaging showed left ankle fracture. Orthopaedic trauma service consulted for evaluation and management. Fracture was successfully reduced by ED provider and patient was placed in short leg splint.  Patient evaluated in emergency department this evening. Husband is at bedside. Other than pain in left ankle, denies pain or injury elsewhere. Patient with A.fib which she takes Eliquis and diabetes for which she takes Metformin. Last took Eliquis this morning (11/09/20). Last Hgb A1C was 6.2 in June 2021. Patient lives at home with her husband and grandchildren. Does not use any assistive device at baseline.  Past Medical History:  Diagnosis Date  . Anticoagulated on Coumadin   . Arthritis    wrist, hands -  . Atrial fibrillation, persistent ([place])   . Bladder tumor   . CAD (coronary artery disease) cardiologist-  dr hochrien   PTCA RCA 1999  . Cataract   . Diabetes ([place])   . Full dentures   . GERD (gastroesophageal reflux disease)   . History of colon polyps    2005  hyperplastic  . History of MI (myocardial infarction)    1999  . HLD (hyperlipidemia)   . Hypertension   . Hypothyroidism   . SUI (stress urinary incontinence, female)   . Wears glasses     Past Surgical History:  Procedure Laterality Date  . CARDIOVASCULAR STRESS TEST  05-01-2009   dr hochrein   normal nuclear study/  no ischemia or scar/  normal LV function and wall motion, ef 71%  . COLONOSCOPY      multiple  . CORONARY ANGIOPLASTY  Feb 1999   PTCA to RCA  . D & C HYSTEROSCOPY/  POLYPECTOMY  05-08-2009  . HYSTEROSCOPY WITH D & C  12/07/2012   Procedure: DILATATION AND CURETTAGE /HYSTEROSCOPY;  Surgeon: Elveria Royals, MD;  Location: [facility];  Service: Gynecology;  Laterality: N/A;  Dilitation and curettage /hysteroscopy/biopsey of lower segment of uterus  . TOTAL HIP ARTHROPLASTY Left 12-11-2010  . TRANSTHORACIC ECHOCARDIOGRAM  09-11-2015   mild LVH, ef 55%,  mildly reduced RVSF,  trivial TR  . TRANSURETHRAL RESECTION OF BLADDER TUMOR N/A 12/01/2015   Procedure: TRANSURETHRAL RESECTION OF BLADDER TUMOR (TURBT);  Surgeon: Kathie Rhodes, MD;  Location: [facility];  Service: Urology;  Laterality: N/A;  . TUBAL LIGATION  1974    Family History  Problem Relation Age of Onset  . Diabetes Mother   . Heart disease Mother   . Colon cancer Mother 71  . Heart disease Father   . Cancer Sister        Lung  . Cancer Brother        intestinal  . Atrial fibrillation Brother   . Cancer Brother        throat ca  . Cancer Brother        bladder  . Breast cancer Neg Hx     Social History:  reports that she quit smoking about 39 years ago. Her  smoking use included cigarettes. She has a 22.50 pack-year smoking history. She has never used smokeless tobacco. She reports that she does not drink alcohol and does not use drugs.  Allergies:  Allergies  Allergen Reactions  . Penicillins Itching    Has patient had a PCN reaction causing immediate rash, facial/tongue/throat swelling, SOB or lightheadedness with hypotension: Yes Has patient had a PCN reaction causing severe rash involving mucus membranes or skin necrosis: No Has patient had a PCN reaction that required hospitalization: No Has patient had a PCN reaction occurring within the last 10 years: Yes If all of the above answers are "NO", then may proceed with Cephalosporin use.   Marland Kitchen Percocet [Oxycodone-Acetaminophen] Itching     Medications: I have reviewed the patient's current medications. Prior to Admission: (Not in a hospital admission)   ROS: Constitutional: No fever or chills Vision: No changes in vision ENT: No difficulty swallowing CV: No chest pain Pulm: No SOB or wheezing GI: No nausea or vomiting GU: No urgency or inability to hold urine Skin: No poor wound healing Neurologic: No numbness or tingling Psychiatric: No depression or anxiety Heme: No bruising Allergic: No reaction to medications or food   Exam: Blood pressure 116/75, pulse 87, temperature 98.2 F (36.8 C), temperature source Oral, resp. rate 18, height 5\' 3"  (1.6 m), weight 85.7 kg, SpO2 96 %. General: NAD Orientation: Alert and oriented x3 Mood and Affect: Mood and affect appropriate Gait: Not assessed Coordination and balance: Within normal limits  LLE: Short leg splint in place. Non-tender above splint. Able to wiggle toes. Endorses sensation to light touch distally. Skin warm and dry.   RLE: Skin without lesions. No tenderness to palpation. Full painless ROM, full strength in each muscle groups without evidence of instability.   Medical Decision Making: Data: Imaging: AP, lateral, mortise view left ankle shoes trimalleolar ankle fracture  Labs:  Results for orders placed or performed during the hospital encounter of 11/09/20 (from the past 24 hour(s))  CBC with Differential     Status: Abnormal   Collection Time: 11/09/20  2:54 PM  Result Value Ref Range   WBC 12.3 (H) 4.0 - 10.5 K/uL   RBC 4.14 3.87 - 5.11 MIL/uL   Hemoglobin 12.0 12.0 - 15.0 g/dL   HCT 38.4 36 - 46 %   MCV 92.8 80.0 - 100.0 fL   MCH 29.0 26.0 - 34.0 pg   MCHC 31.3 30.0 - 36.0 g/dL   RDW 14.4 11.5 - 15.5 %   Platelets 280 150 - 400 K/uL   nRBC 0.0 0.0 - 0.2 %   Neutrophils Relative % 57 %   Neutro Abs 7.1 1.7 - 7.7 K/uL   Lymphocytes Relative 28 %   Lymphs Abs 3.5 0.7 - 4.0 K/uL   Monocytes Relative 12 %   Monocytes Absolute 1.5 (H)  0.1 - 1.0 K/uL   Eosinophils Relative 2 %   Eosinophils Absolute 0.2 0.0 - 0.5 K/uL   Basophils Relative 0 %   Basophils Absolute 0.0 0.0 - 0.1 K/uL   Immature Granulocytes 1 %   Abs Immature Granulocytes 0.06 0.00 - 0.07 K/uL  Basic metabolic panel     Status: Abnormal   Collection Time: 11/09/20  2:54 PM  Result Value Ref Range   Sodium 136 135 - 145 mmol/L   Potassium 4.5 3.5 - 5.1 mmol/L   Chloride 103 98 - 111 mmol/L   CO2 21 (L) 22 - 32 mmol/L   Glucose, Bld 107 (  H) 70 - 99 mg/dL   BUN 18 8 - 23 mg/dL   Creatinine, Ser 1.24 (H) 0.44 - 1.00 mg/dL   Calcium 9.5 8.9 - 10.3 mg/dL   GFR, Estimated 47 (L) >60 mL/min   Anion gap 12 5 - 15  Respiratory Panel by RT PCR (Flu A&B, Covid) - Nasopharyngeal Swab     Status: None   Collection Time: 11/09/20  5:04 PM   Specimen: Nasopharyngeal Swab  Result Value Ref Range   SARS Coronavirus 2 by RT PCR NEGATIVE NEGATIVE   Influenza A by PCR NEGATIVE NEGATIVE   Influenza B by PCR NEGATIVE NEGATIVE    Assessment/Plan: 69 year old female s/p fall with left trimalleolar ankle fracture.   Due to high risk of falling if she returns home, plan to admit patient to internal medicine service for pain control. We will continue to monitor her swelling and plan for possible fixation as early as tomorrow morning. Will allow for a diet now. Plan for NPO after midnight. Hold Eliquis.  Marquay Kruse A. Carmie Kanner [facility] [phone] (office) [email or web address]

## 2020-11-10 ENCOUNTER — Inpatient Hospital Stay (HOSPITAL_COMMUNITY): Payer: Medicare Other

## 2020-11-10 ENCOUNTER — Inpatient Hospital Stay (HOSPITAL_COMMUNITY): Payer: Medicare Other | Admitting: Certified Registered"

## 2020-11-10 ENCOUNTER — Encounter (HOSPITAL_COMMUNITY): Payer: Self-pay | Admitting: Internal Medicine

## 2020-11-10 ENCOUNTER — Encounter (HOSPITAL_COMMUNITY)
Admission: EM | Disposition: A | Discharge status: Home Health | Payer: Self-pay | Source: Home / Self Care | Attending: Internal Medicine

## 2020-11-10 DIAGNOSIS — E039 Hypothyroidism, unspecified: Secondary | ICD-10-CM

## 2020-11-10 DIAGNOSIS — E119 Type 2 diabetes mellitus without complications: Secondary | ICD-10-CM

## 2020-11-10 DIAGNOSIS — S82852A Displaced trimalleolar fracture of left lower leg, initial encounter for closed fracture: Secondary | ICD-10-CM | POA: Diagnosis not present

## 2020-11-10 DIAGNOSIS — I482 Chronic atrial fibrillation, unspecified: Secondary | ICD-10-CM

## 2020-11-10 DIAGNOSIS — I10 Essential (primary) hypertension: Secondary | ICD-10-CM | POA: Diagnosis not present

## 2020-11-10 DIAGNOSIS — E785 Hyperlipidemia, unspecified: Secondary | ICD-10-CM

## 2020-11-10 HISTORY — PX: ORIF ANKLE FRACTURE: SHX5408

## 2020-11-10 LAB — BASIC METABOLIC PANEL
Anion gap: 8 (ref 5–15)
BUN: 15 mg/dL (ref 8–23)
CO2: 22 mmol/L (ref 22–32)
Calcium: 8.7 mg/dL — ABNORMAL LOW (ref 8.9–10.3)
Chloride: 105 mmol/L (ref 98–111)
Creatinine, Ser: 1.05 mg/dL — ABNORMAL HIGH (ref 0.44–1.00)
GFR, Estimated: 57 mL/min — ABNORMAL LOW (ref >60)
Glucose, Bld: 119 mg/dL — ABNORMAL HIGH (ref 70–99)
Potassium: 4.1 mmol/L (ref 3.5–5.1)
Sodium: 135 mmol/L (ref 135–145)

## 2020-11-10 LAB — CBC
HCT: 35 % — ABNORMAL LOW (ref 36.0–46.0)
Hemoglobin: 11.2 g/dL — ABNORMAL LOW (ref 12.0–15.0)
MCH: 28.6 pg (ref 26.0–34.0)
MCHC: 32 g/dL (ref 30.0–36.0)
MCV: 89.5 fL (ref 80.0–100.0)
Platelets: 255 10*3/uL (ref 150–400)
RBC: 3.91 MIL/uL (ref 3.87–5.11)
RDW: 14.3 % (ref 11.5–15.5)
WBC: 10.3 10*3/uL (ref 4.0–10.5)
nRBC: 0 % (ref 0.0–0.2)

## 2020-11-10 LAB — SURGICAL PCR SCREEN
MRSA, PCR: NEGATIVE
Staphylococcus aureus: NEGATIVE

## 2020-11-10 LAB — GLUCOSE, CAPILLARY: Glucose-Capillary: 137 mg/dL — ABNORMAL HIGH (ref 70–99)

## 2020-11-10 SURGERY — OPEN REDUCTION INTERNAL FIXATION (ORIF) ANKLE FRACTURE
Anesthesia: Regional | Site: Ankle | Laterality: Left

## 2020-11-10 MED ORDER — POLYETHYLENE GLYCOL 3350 17 G PO PACK
17.0000 g | PACK | Freq: Two times a day (BID) | ORAL | Status: DC
  Administered 2020-11-10 – 2020-11-11 (×3): 17 g via ORAL
  Filled 2020-11-10 (×3): qty 1

## 2020-11-10 MED ORDER — PROPOFOL 10 MG/ML IV BOLUS
INTRAVENOUS | Status: AC
  Filled 2020-11-10: qty 20

## 2020-11-10 MED ORDER — VANCOMYCIN HCL 1000 MG IV SOLR
INTRAVENOUS | Status: AC
  Filled 2020-11-10: qty 1000

## 2020-11-10 MED ORDER — 0.9 % SODIUM CHLORIDE (POUR BTL) OPTIME
TOPICAL | Status: DC | PRN
  Administered 2020-11-10: 1000 mL

## 2020-11-10 MED ORDER — ORAL CARE MOUTH RINSE: 15.0000 mL | Freq: Once | OROMUCOSAL | Status: AC

## 2020-11-10 MED ORDER — BACITRACIN ZINC 500 UNIT/GM EX OINT
TOPICAL_OINTMENT | CUTANEOUS | Status: AC
  Filled 2020-11-10: qty 28.35

## 2020-11-10 MED ORDER — FENTANYL CITRATE (PF) 250 MCG/5ML IJ SOLN
INTRAMUSCULAR | Status: DC | PRN
Start: 2020-11-10 — End: 2020-11-10
  Administered 2020-11-10: 50 ug via INTRAVENOUS

## 2020-11-10 MED ORDER — MIDAZOLAM HCL 2 MG/2ML IJ SOLN
INTRAMUSCULAR | Status: AC
  Filled 2020-11-10: qty 2

## 2020-11-10 MED ORDER — ONDANSETRON HCL 4 MG/2ML IJ SOLN
INTRAMUSCULAR | Status: AC
  Filled 2020-11-10: qty 4

## 2020-11-10 MED ORDER — PHENYLEPHRINE HCL-NACL 10-0.9 MG/250ML-% IV SOLN
INTRAVENOUS | Status: DC | PRN
  Administered 2020-11-10: 30 ug/min via INTRAVENOUS

## 2020-11-10 MED ORDER — PHENYLEPHRINE 40 MCG/ML (10ML) SYRINGE FOR IV PUSH (FOR BLOOD PRESSURE SUPPORT)
PREFILLED_SYRINGE | INTRAVENOUS | Status: AC
  Filled 2020-11-10: qty 10

## 2020-11-10 MED ORDER — CHLORHEXIDINE GLUCONATE 0.12 % MT SOLN: 15.0000 mL | Freq: Once | OROMUCOSAL | Status: AC

## 2020-11-10 MED ORDER — MIDAZOLAM HCL 2 MG/2ML IJ SOLN
INTRAMUSCULAR | Status: DC | PRN
  Administered 2020-11-10: 2 mg via INTRAVENOUS

## 2020-11-10 MED ORDER — FENTANYL CITRATE (PF) 100 MCG/2ML IJ SOLN
INTRAMUSCULAR | Status: AC
  Filled 2020-11-10: qty 2

## 2020-11-10 MED ORDER — LACTATED RINGERS IV SOLN: INTRAVENOUS | Status: DC

## 2020-11-10 MED ORDER — ONDANSETRON HCL 4 MG/2ML IJ SOLN
INTRAMUSCULAR | Status: DC | PRN
  Administered 2020-11-10: 4 mg via INTRAVENOUS

## 2020-11-10 MED ORDER — DEXAMETHASONE SODIUM PHOSPHATE 10 MG/ML IJ SOLN
INTRAMUSCULAR | Status: DC | PRN
  Administered 2020-11-10: 10 mg

## 2020-11-10 MED ORDER — SENNA 8.6 MG PO TABS
1.0000 | ORAL_TABLET | Freq: Every day | ORAL | Status: DC
  Administered 2020-11-10 – 2020-11-11 (×2): 8.6 mg via ORAL
  Filled 2020-11-10 (×2): qty 1

## 2020-11-10 MED ORDER — PHENYLEPHRINE HCL (PRESSORS) 10 MG/ML IV SOLN
INTRAVENOUS | Status: DC | PRN
  Administered 2020-11-10: 40 ug via INTRAVENOUS
  Administered 2020-11-10 (×2): 80 ug via INTRAVENOUS

## 2020-11-10 MED ORDER — BUPIVACAINE HCL (PF) 0.5 % IJ SOLN
INTRAMUSCULAR | Status: AC
  Filled 2020-11-10: qty 30

## 2020-11-10 MED ORDER — CEFAZOLIN SODIUM-DEXTROSE 2-4 GM/100ML-% IV SOLN
2.0000 g | Freq: Three times a day (TID) | INTRAVENOUS | Status: AC
  Administered 2020-11-10 – 2020-11-11 (×3): 2 g via INTRAVENOUS
  Filled 2020-11-10 (×3): qty 100

## 2020-11-10 MED ORDER — CHLORHEXIDINE GLUCONATE 0.12 % MT SOLN
OROMUCOSAL | Status: AC
  Administered 2020-11-10: 15 mL via OROMUCOSAL
  Filled 2020-11-10: qty 15

## 2020-11-10 MED ORDER — FENTANYL CITRATE (PF) 250 MCG/5ML IJ SOLN
INTRAMUSCULAR | Status: AC
  Filled 2020-11-10: qty 5

## 2020-11-10 MED ORDER — PROPOFOL 10 MG/ML IV BOLUS
INTRAVENOUS | Status: DC | PRN
  Administered 2020-11-10: 100 mg via INTRAVENOUS

## 2020-11-10 MED ORDER — PHENYLEPHRINE HCL (PRESSORS) 10 MG/ML IV SOLN
INTRAVENOUS | Status: AC
  Filled 2020-11-10: qty 1

## 2020-11-10 MED ORDER — ACETAMINOPHEN 325 MG PO TABS: 650.0000 mg | ORAL_TABLET | Freq: Four times a day (QID) | ORAL | Status: DC

## 2020-11-10 MED ORDER — LIDOCAINE 2% (20 MG/ML) 5 ML SYRINGE
INTRAMUSCULAR | Status: AC
  Filled 2020-11-10: qty 10

## 2020-11-10 MED ORDER — ACETAMINOPHEN 325 MG PO TABS
650.0000 mg | ORAL_TABLET | Freq: Three times a day (TID) | ORAL | Status: DC
  Administered 2020-11-10 – 2020-11-11 (×4): 650 mg via ORAL
  Filled 2020-11-10 (×4): qty 2

## 2020-11-10 MED ORDER — ROPIVACAINE HCL 5 MG/ML IJ SOLN
INTRAMUSCULAR | Status: DC | PRN
  Administered 2020-11-10: 40 mL via PERINEURAL

## 2020-11-10 MED ORDER — CEFAZOLIN SODIUM 1 G IJ SOLR
INTRAMUSCULAR | Status: AC
  Filled 2020-11-10: qty 40

## 2020-11-10 MED ORDER — DEXAMETHASONE SODIUM PHOSPHATE 4 MG/ML IJ SOLN
INTRAMUSCULAR | Status: DC | PRN
  Administered 2020-11-10: 5 mg via INTRAVENOUS

## 2020-11-10 MED ORDER — CEFAZOLIN SODIUM-DEXTROSE 2-3 GM-%(50ML) IV SOLR
INTRAVENOUS | Status: DC | PRN
  Administered 2020-11-10: 2 g via INTRAVENOUS

## 2020-11-10 MED ORDER — DEXAMETHASONE SODIUM PHOSPHATE 10 MG/ML IJ SOLN
INTRAMUSCULAR | Status: AC
  Filled 2020-11-10: qty 2

## 2020-11-10 MED ORDER — LIDOCAINE HCL (CARDIAC) PF 100 MG/5ML IV SOSY
PREFILLED_SYRINGE | INTRAVENOUS | Status: DC | PRN
  Administered 2020-11-10: 20 mg via INTRATRACHEAL

## 2020-11-10 MED ORDER — APIXABAN 5 MG PO TABS
5.0000 mg | ORAL_TABLET | Freq: Two times a day (BID) | ORAL | Status: DC
  Administered 2020-11-11: 5 mg via ORAL
  Filled 2020-11-10: qty 1

## 2020-11-10 MED ORDER — VANCOMYCIN HCL 1000 MG IV SOLR
INTRAVENOUS | Status: DC | PRN
  Administered 2020-11-10: 1000 mg

## 2020-11-10 SURGICAL SUPPLY — 76 items
APL PRP STRL LF DISP 70% ISPRP (MISCELLANEOUS) ×1
BANDAGE ESMARK 6X9 LF (GAUZE/BANDAGES/DRESSINGS) ×1 IMPLANT
BIT DRILL QC 2.0 SHORT EVOS SM (DRILL) IMPLANT
BIT DRILL QC 2.5MM SHRT EVO SM (DRILL) IMPLANT
BNDG CMPR 9X6 STRL LF SNTH (GAUZE/BANDAGES/DRESSINGS) ×1
BNDG COHESIVE 4X5 TAN STRL (GAUZE/BANDAGES/DRESSINGS) ×2 IMPLANT
BNDG ELASTIC 4X5.8 VLCR STR LF (GAUZE/BANDAGES/DRESSINGS) ×1 IMPLANT
BNDG ELASTIC 6X5.8 VLCR STR LF (GAUZE/BANDAGES/DRESSINGS) IMPLANT
BNDG ESMARK 6X9 LF (GAUZE/BANDAGES/DRESSINGS) ×2
BRUSH SCRUB EZ PLAIN DRY (MISCELLANEOUS) ×4 IMPLANT
CHLORAPREP W/TINT 26 (MISCELLANEOUS) ×2 IMPLANT
COVER SURGICAL LIGHT HANDLE (MISCELLANEOUS) ×2 IMPLANT
COVER WAND RF STERILE (DRAPES) ×2 IMPLANT
DRAPE C-ARM 42X72 X-RAY (DRAPES) ×2 IMPLANT
DRAPE C-ARMOR (DRAPES) ×2 IMPLANT
DRAPE ORTHO SPLIT 77X108 STRL (DRAPES) ×4
DRAPE SURG ORHT 6 SPLT 77X108 (DRAPES) ×2 IMPLANT
DRAPE U-SHAPE 47X51 STRL (DRAPES) ×2 IMPLANT
DRILL QC 2.0 SHORT EVOS SM (DRILL) ×4
DRILL QC 2.5MM SHORT EVOS SM (DRILL) ×2
DRSG ADAPTIC 3X8 NADH LF (GAUZE/BANDAGES/DRESSINGS) IMPLANT
DRSG MEPITEL 4X7.2 (GAUZE/BANDAGES/DRESSINGS) ×1 IMPLANT
ELECT REM PT RETURN 9FT ADLT (ELECTROSURGICAL) ×2
ELECTRODE REM PT RTRN 9FT ADLT (ELECTROSURGICAL) ×1 IMPLANT
GAUZE SPONGE 4X4 12PLY STRL (GAUZE/BANDAGES/DRESSINGS) ×1 IMPLANT
GLOVE BIO SURGEON STRL SZ 6.5 (GLOVE) ×6 IMPLANT
GLOVE BIO SURGEON STRL SZ7.5 (GLOVE) ×6 IMPLANT
GLOVE BIOGEL PI IND STRL 6.5 (GLOVE) ×1 IMPLANT
GLOVE BIOGEL PI IND STRL 7.5 (GLOVE) ×1 IMPLANT
GLOVE BIOGEL PI INDICATOR 6.5 (GLOVE) ×1
GLOVE BIOGEL PI INDICATOR 7.5 (GLOVE) ×1
GOWN STRL REUS W/ TWL LRG LVL3 (GOWN DISPOSABLE) ×2 IMPLANT
GOWN STRL REUS W/TWL LRG LVL3 (GOWN DISPOSABLE) ×4
K-WIRE 1.6 (WIRE) ×6
K-WIRE FX150X1.6XTROC PNT (WIRE) ×3
KIT TURNOVER KIT B (KITS) ×2 IMPLANT
KWIRE FX150X1.6XTROC PNT (WIRE) IMPLANT
MANIFOLD NEPTUNE II (INSTRUMENTS) ×2 IMPLANT
NDL HYPO 21X1.5 SAFETY (NEEDLE) IMPLANT
NDL HYPO 25GX1X1/2 BEV (NEEDLE) ×1 IMPLANT
NEEDLE HYPO 21X1.5 SAFETY (NEEDLE) IMPLANT
NEEDLE HYPO 25GX1X1/2 BEV (NEEDLE) IMPLANT
NS IRRIG 1000ML POUR BTL (IV SOLUTION) ×2 IMPLANT
PACK TOTAL JOINT (CUSTOM PROCEDURE TRAY) ×2 IMPLANT
PAD ABD 8X10 STRL (GAUZE/BANDAGES/DRESSINGS) ×1 IMPLANT
PAD ARMBOARD 7.5X6 YLW CONV (MISCELLANEOUS) ×4 IMPLANT
PAD CAST 3X4 CTTN HI CHSV (CAST SUPPLIES) IMPLANT
PAD CAST 4YDX4 CTTN HI CHSV (CAST SUPPLIES) IMPLANT
PADDING CAST COTTON 3X4 STRL (CAST SUPPLIES) ×2
PADDING CAST COTTON 4X4 STRL (CAST SUPPLIES)
PADDING CAST COTTON 6X4 STRL (CAST SUPPLIES) IMPLANT
PLATE POST FIB 2.7X93 5H (Plate) ×1 IMPLANT
SCREW CORT 2.7X12 EVOS (Screw) IMPLANT
SCREW CORT 2.7X15 T8 ST EVOS (Screw) ×1 IMPLANT
SCREW CORT 2.7X16 STAR T8 EVOS (Screw) ×1 IMPLANT
SCREW CORT 2.7X20 T8 ST EVOS (Screw) ×1 IMPLANT
SCREW CORT 3.5X14 ST EVOS (Screw) ×3 IMPLANT
SCREW CORT EVOS ST T8 2.7X14MM (Screw) ×2 IMPLANT
SCREW CORT VA EVOS 2.7X60 (Screw) ×1 IMPLANT
SCREW CORT VA EVOS 2.7X65 (Screw) ×1 IMPLANT
SCREW LOCK 3.5X14MM EVOS (Screw) ×1 IMPLANT
SPONGE LAP 18X18 RF (DISPOSABLE) ×1 IMPLANT
STAPLER VISISTAT 35W (STAPLE) ×2 IMPLANT
SUCTION FRAZIER HANDLE 10FR (MISCELLANEOUS) ×2
SUCTION TUBE FRAZIER 10FR DISP (MISCELLANEOUS) ×1 IMPLANT
SUT ETHILON 3 0 PS 1 (SUTURE) ×4 IMPLANT
SUT PROLENE 0 CT (SUTURE) IMPLANT
SUT VIC AB 0 CT1 27 (SUTURE) ×2
SUT VIC AB 0 CT1 27XBRD ANBCTR (SUTURE) ×1 IMPLANT
SUT VIC AB 2-0 CT1 27 (SUTURE) ×4
SUT VIC AB 2-0 CT1 TAPERPNT 27 (SUTURE) ×2 IMPLANT
SYR CONTROL 10ML LL (SYRINGE) ×2 IMPLANT
TOWEL GREEN STERILE (TOWEL DISPOSABLE) ×4 IMPLANT
TOWEL GREEN STERILE FF (TOWEL DISPOSABLE) ×2 IMPLANT
UNDERPAD 30X36 HEAVY ABSORB (UNDERPADS AND DIAPERS) ×2 IMPLANT
WATER STERILE IRR 1000ML POUR (IV SOLUTION) ×2 IMPLANT

## 2020-11-10 NOTE — Anesthesia Postprocedure Evaluation (Signed)
Anesthesia Post Note  Patient: [name]  Procedure(s) Performed: OPEN REDUCTION INTERNAL FIXATION (ORIF) ANKLE FRACTURE (Left Ankle)     Patient location during evaluation: PACU Anesthesia Type: Regional and General Level of consciousness: awake and alert, oriented and patient cooperative Pain management: pain level controlled Vital Signs Assessment: post-procedure vital signs reviewed and stable Respiratory status: spontaneous breathing, nonlabored ventilation and respiratory function stable Cardiovascular status: blood pressure returned to baseline and stable Postop Assessment: no apparent nausea or vomiting Anesthetic complications: no   No complications documented.  Last Vitals:  Vitals:   11/10/20 0955 11/10/20 1010  BP: 117/72 105/69  Pulse: 93 (!) 102  Resp: (!) 21 13  Temp: 36.8 C   SpO2: 92% 99%    Last Pain:  Vitals:   11/10/20 1010  TempSrc:   PainSc: 0-No pain                 Pervis Hocking

## 2020-11-10 NOTE — Op Note (Signed)
Orthopaedic Surgery Operative Note (CSN: [id number] ) Date of Surgery: 11/10/2020  Admit Date: 11/09/2020   Diagnoses: Pre-Op Diagnoses: Left trimalleolar ankle fracture dislocation   Post-Op Diagnosis: Same  Procedures: 1. CPT 27822-Open reduction internal fixation of left trimalleolar ankle fracture 2. CPT 77071-Stress examination of left ankle  Surgeons : Primary: Usama Harkless, Thomasene Lot, MD  Assistant: Patrecia Pace, PA-C  Location: OR 3   Anesthesia:General with regional  Antibiotics: Ancef 2g preop with 1 gm vancomycin powder   Tourniquet time:None used  Estimated Blood BMWU:13 mL  Complications:None   Specimens:None   Implants: Implant Name Type Inv. Item Serial No. Manufacturer Lot No. LRB No. Used Action  PLATE POST FIB 2.4M01 5H - [id number] Plate PLATE POST FIB 4.0H47 5H  [employer]  Left 1 Implanted  SCREW CORT 3.5X14 ST EVOS - [id number] Screw SCREW CORT 3.5X14 ST EVOS  [employer]  Left 3 Implanted  SCREW CORT 2.7X20 T8 ST EVOS - [id number] Screw SCREW CORT 2.7X20 T8 ST EVOS  [employer]  Left 1 Implanted  SCREW CORT 2.7X15 T8 ST EVOS - [id number] Screw SCREW CORT 2.7X15 T8 ST EVOS  [employer]  Left 1 Implanted  SCREW CORT 2.7X16 STAR T8 EVOS - [id number] Screw SCREW CORT 2.7X16 STAR T8 EVOS  [employer]  Left 1 Implanted  SCREW CORT EVOS ST T8 2.7X14MM - [id number] Screw SCREW CORT EVOS ST T8 2.7X14MM  [employer]  Left 1 Implanted  SCREW CORT VA EVOS 2.7X65 - [id number] Screw SCREW CORT VA EVOS 2.7X65  [employer]  Left 1 Implanted  SCREW CORT VA EVOS 2.7X60 - [id number] Screw SCREW CORT VA EVOS 2.7X60  [employer]  Left 1 Implanted  SCREW LOCK 3.5X14MM EVOS - [id number] Screw SCREW LOCK 3.5X14MM EVOS  [employer]  Left 1 Implanted     Indications for Surgery: 69 year old female who sustained a ground-level fall and a  left trimalleolar ankle fracture dislocation.  She was brought to the emergency room where x-rays showed the above injury.  She was reduced by the emergency room physician.  She was subsequently admitted due to difficulty with mobilization and her age and medical comorbidities.  Due to the significant unstable nature of her injury I recommended proceeding with open reduction internal fixation.  Risks and benefits were discussed with the patient.  Risks include but not limited to bleeding, infection, malunion, nonunion, hardware failure, hardware irritation, nerve or blood vessel injury, ankle stiffness, ankle arthritis, even the possibility of anesthetic complications.  The patient agreed to proceed with surgery and consent was obtained.  Operative Findings: 1.  Open reduction internal fixation of left trimalleolar ankle fracture using [employer] EVOS posterior lateral locking plate and independent 2.7 millimeter screws for the medial malleolus. 2.  External rotation stress view under fluoroscopy showing no significant medial clear space widening or syndesmotic disruption.  Procedure: The patient was identified in the preoperative holding area. Consent was confirmed with the patient and their family and all questions were answered. The operative extremity was marked after confirmation with the patient. she was then brought back to the operating room by our anesthesia colleagues.  She was carefully transferred over to a radiolucent flat top table.  She was placed under general anesthetic.  A nonsterile tourniquet was placed to the upper thigh.  The left lower extremity was then prepped and draped in usual sterile fashion.  A  timeout was performed to verify the patient, the procedure, and the extremity.  Preoperative antibiotics were dosed.  Fluoroscopic imaging was obtained to show the unstable nature of his injury.  A direct lateral approach to the distal fibula was carried down through skin and  subcutaneous tissue.  I carefully dissected to protect the superficial peroneal nerve.  I exposed the fracture and reduced it with a reduction tenaculum.  I then held it provisionally with a 1.6 mm K wires from anterior to posterior.  I then placed a 5 hole [employer] EVOS posterior lateral locking plate.  I held it provisionally with a K wire.  I placed nonlocking screws into the fibular shaft.  I then placed locking screws into the distal segment.  Reduction was held.  I then made a curvilinear incision along the medial malleolus.  I carried down through skin and subcutaneous tissue.  I exposed the fracture and reduced it with a reduction tenaculum.  Anatomic reduction was confirmed with fluoroscopy.  I then held provisionally with a 1.6 mm K wire.  I then drilled bicortically and placed a 2.7 millimeter screws.  Process was performed anterior and posterior in the medial malleolus.  Excellent fixation was obtained.  I then performed external rotation stress view of the left ankle and I did not appreciate any significant medial clear space widening to require syndesmotic fixation.  I placed a another locking screw into the fibular shaft to reinforce the fixation.  Final fluoroscopic imaging was obtained.  The incision was copiously irrigated.  A gram of vancomycin powder was placed into the incisions.  A layer closure of 2-0 Vicryl and 3-0 nylon was used to close the skin.  Sterile dressing was placed.  Patient was then placed in a boot.  She was awoken from anesthesia and taken to the PACU in stable condition.  Post Op Plan/Instructions: Patient will be weightbearing for transfers only.  She will receive postoperative Ancef.  She may be restarted on her Eliquis starting postoperative day 1.  I would recommend physical and occupational therapy to help mobilize the patient.  I was present and performed the entire surgery.  Patrecia Pace, PA-C did assist me throughout the case. An assistant was  necessary given the difficulty in approach, maintenance of reduction and ability to instrument the fracture.   Katha Hamming, MD [facility]

## 2020-11-10 NOTE — Anesthesia Procedure Notes (Signed)
Procedure Name: LMA Insertion Date/Time: 11/10/2020 8:18 AM Performed by: Griffin Dakin, CRNA Pre-anesthesia Checklist: Patient identified, Emergency Drugs available, Suction available and Patient being monitored Patient Re-evaluated:Patient Re-evaluated prior to induction Oxygen Delivery Method: Circle system utilized Preoxygenation: Pre-oxygenation with 100% oxygen Induction Type: IV induction Ventilation: Mask ventilation without difficulty LMA: LMA inserted LMA Size: 4.0 Number of attempts: 1 Placement Confirmation: positive ETCO2 and breath sounds checked- equal and bilateral Tube secured with: Tape Dental Injury: Teeth and Oropharynx as per pre-operative assessment

## 2020-11-10 NOTE — Evaluation (Addendum)
Physical Therapy Evaluation Patient Details Name: [name] MRN: [id number] DOB: 1951/11/02 Today's Date: 11/10/2020   History of Present Illness  Pt is a 69 y.o. F iwth significant PMH of HTN, A. fib, who presents after a fall off a curb with left trimalleolar ankle fracture. Now s/p ORIF.   Clinical Impression  Prior to admission, pt lives with her spouse and son in a one level home and is independent. Pt reporting fair pain control, but that nerve block was still slightly in effect distally. Pt requiring min assist for stand pivot transfers using a walker. HR elevated up to 162 bpm during mobility. Suspect she will progress well given PLOF. Will need further transfer training and wheelchair mobility prior to discharge. See below for recommendations.     Follow Up Recommendations Home health PT;Supervision for mobility/OOB    Equipment Recommendations  Rolling walker with 5" wheels;3in1 (PT);Wheelchair (measurements PT);Wheelchair cushion (measurements PT)    Recommendations for Other Services OT consult     Precautions / Restrictions Precautions Precautions: Fall;Other (comment) Precaution Comments: watch HR Required Braces or Orthoses: Other Brace Other Brace: L CAM boot Restrictions Weight Bearing Restrictions: Yes LLE Weight Bearing: Weight bearing as tolerated (for transfers only) Other Position/Activity Restrictions: WB for transfers only, no walker ambulation      Mobility  Bed Mobility Overal bed mobility: Needs Assistance Bed Mobility: Supine to Sit     Supine to sit: Min assist     General bed mobility comments: Light minA for LLE negotiation off edge of bed. Cues for use of bed rail to pull trunk to upright    Transfers Overall transfer level: Needs assistance Equipment used: Rolling walker (2 wheeled) Transfers: Sit to/from [facility] Sit to Stand: Min assist Stand pivot transfers: Min assist       General transfer comment:  MinA to rise to stand and pivot to recliner then to [facility]. Cues for hand/foot placement, sequencing, direction.  Ambulation/Gait                Stairs            Wheelchair Mobility    Modified Rankin (Stroke Patients Only)       Balance Overall balance assessment: Needs assistance Sitting-balance support: Feet supported Sitting balance-Leahy Scale: Good     Standing balance support: Bilateral upper extremity supported Standing balance-Leahy Scale: Poor                               Pertinent Vitals/Pain Pain Assessment: Faces Faces Pain Scale: Hurts a little bit Pain Location: posterior LLE Pain Descriptors / Indicators: Guarding Pain Intervention(s): Monitored during session;RN gave pain meds during session    Home Living Family/patient expects to be discharged to:: Private residence Living Arrangements: Spouse/significant other;Children (husband, son) Available Help at Discharge: Family Type of Home: House Home Access: Other (comment) (very small threshold)     Home Layout: One level Home Equipment: None      Prior Function Level of Independence: Independent         Comments: Retired, enjoys crosswords and reading     Journalist, newspaper        Extremity/Trunk Assessment   Upper Extremity Assessment Upper Extremity Assessment: Overall WFL for tasks assessed    Lower Extremity Assessment Lower Extremity Assessment: LLE deficits/detail LLE Deficits / Details: Able to perform limited SLR, reports continued numbness distally from nerve block    Cervical /  Trunk Assessment Cervical / Trunk Assessment: Normal  Communication   Communication: No difficulties  Cognition Arousal/Alertness: Awake/alert Behavior During Therapy: WFL for tasks assessed/performed Overall Cognitive Status: Within Functional Limits for tasks assessed                                        General Comments      Exercises      Assessment/Plan    PT Assessment Patient needs continued PT services  PT Problem List Decreased strength;Decreased activity tolerance;Decreased balance;Decreased mobility;Pain       PT Treatment Interventions DME instruction;Functional mobility training;Therapeutic activities;Therapeutic exercise;Balance training;Patient/family education;Wheelchair mobility training    PT Goals (Current goals can be found in the Care Plan section)  Acute Rehab PT Goals Patient Stated Goal: did not state; agreeable to therapy PT Goal Formulation: With patient Time For Goal Achievement: 11/24/20 Potential to Achieve Goals: Good Additional Goals Additional Goal #1: Pt will propel wheelchair x 250 feet with BUE's modI.    Frequency Min 5X/week   Barriers to discharge        Co-evaluation               AM-PAC PT "6 Clicks" Mobility  Outcome Measure Help needed turning from your back to your side while in a flat bed without using bedrails?: None Help needed moving from lying on your back to sitting on the side of a flat bed without using bedrails?: A Little Help needed moving to and from a bed to a chair (including a wheelchair)?: A Little Help needed standing up from a chair using your arms (e.g., wheelchair or bedside chair)?: A Little Help needed to walk in hospital room?: Total Help needed climbing 3-5 steps with a railing? : Total 6 Click Score: 15    End of Session Equipment Utilized During Treatment: Gait belt Activity Tolerance: Patient tolerated treatment well Patient left: in chair;with call bell/phone within reach;with chair alarm set Nurse Communication: Mobility status PT Visit Diagnosis: Pain;Other abnormalities of gait and mobility (R26.89) Pain - Right/Left: Left Pain - part of body: Ankle and joints of foot    Time: 4158-3094 PT Time Calculation (min) (ACUTE ONLY): 26 min   Charges:   PT Evaluation $PT Eval Moderate Complexity: 1 Mod PT Treatments $Therapeutic  Activity: 8-22 mins        Wyona Almas, PT, DPT Acute Rehabilitation Services Pager [phone] Office [phone]   Deno Etienne 11/10/2020, 5:12 PM

## 2020-11-10 NOTE — Anesthesia Preprocedure Evaluation (Signed)
Anesthesia Evaluation  Patient identified by MRN, date of birth, ID band Patient awake    Reviewed: Allergy & Precautions, NPO status , Patient's Chart, lab work & pertinent test results  Airway Mallampati: III  TM Distance: >3 FB Neck ROM: Full    Dental  (+) Edentulous Upper, Edentulous Lower   Pulmonary sleep apnea and Continuous Positive Airway Pressure Ventilation , former smoker,    Pulmonary exam normal breath sounds clear to auscultation       Cardiovascular hypertension, Pt. on medications + CAD, + Past MI (MI 1999 with PCI, no stents) and +CHF (grade 1 diastolic dysfunction)  Normal cardiovascular exam+ dysrhythmias (eliquis LD yesterday 24h ago) Atrial Fibrillation  Rhythm:Regular Rate:Normal  Echo 2019: - Left ventricle: The cavity size was normal. Wall thickness was  increased in a pattern of mild LVH. Systolic function was normal.  The estimated ejection fraction was in the range of 55% to 60%.  Wall motion was normal; there were no regional wall motion  abnormalities. Doppler parameters are consistent with abnormal  left ventricular relaxation (grade 1 diastolic dysfunction).  - Mitral valve: Calcified annulus. There was trivial regurgitation.  - Left atrium: The atrium was mildly dilated.  - Right ventricle: Systolic function was mildly reduced. Systolic  pressure was increased.  - Right atrium: The atrium was mildly dilated.  - Tricuspid valve: There was mild regurgitation.  - Pulmonic valve: There was no significant regurgitation.  - Pulmonary arteries: PA peak pressure: 33 mm Hg (S).    Neuro/Psych Peripheral neuropathy  Neuromuscular disease negative psych ROS   GI/Hepatic Neg liver ROS, GERD  ,  Endo/Other  diabetes, Well Controlled, Type 2, Oral Hypoglycemic AgentsHypothyroidism Last a1c 6.4  Renal/GU Renal InsufficiencyRenal diseaseCr 1.05  negative genitourinary    Musculoskeletal  (+) Arthritis , Osteoarthritis,  L ankle trimalleolar fx   Abdominal   Peds negative pediatric ROS (+)  Hematology negative hematology ROS (+)   Anesthesia Other Findings   Reproductive/Obstetrics negative OB ROS                             Anesthesia Physical Anesthesia Plan  ASA: III  Anesthesia Plan: General and Regional   Post-op Pain Management: GA combined w/ Regional for post-op pain   Induction: Intravenous  PONV Risk Score and Plan: 3 and Ondansetron, Dexamethasone and Treatment may vary due to age or medical condition  Airway Management Planned: LMA  Additional Equipment: None  Intra-op Plan:   Post-operative Plan: Extubation in OR  Informed Consent: I have reviewed the patients History and Physical, chart, labs and discussed the procedure including the risks, benefits and alternatives for the proposed anesthesia with the patient or authorized representative who has indicated his/her understanding and acceptance.     Dental advisory given  Plan Discussed with: CRNA  Anesthesia Plan Comments:         Anesthesia Quick Evaluation

## 2020-11-10 NOTE — Progress Notes (Signed)
Orthopedic Tech Progress Note Patient Details:  [name] 1951/12/18 [id number]  Ortho Devices Type of Ortho Device: CAM walker Splint Material: Plaster Ortho Device/Splint Location: Left Lower Extremity Ortho Device/Splint Interventions: Ordered, Application   Post Interventions Patient Tolerated: Well Instructions Provided: Adjustment of device, Care of device, Poper ambulation with device   Bethany Hirt P Lorel Monaco 11/10/2020, 12:52 PM

## 2020-11-10 NOTE — Anesthesia Procedure Notes (Signed)
Anesthesia Regional Block: Adductor canal block   Pre-Anesthetic Checklist: ,, timeout performed, Correct Patient, Correct Site, Correct Laterality, Correct Procedure, Correct Position, site marked, Risks and benefits discussed,  Surgical consent,  Pre-op evaluation,  At surgeon's request and post-op pain management  Laterality: Left  Prep: Maximum Sterile Barrier Precautions used, chloraprep       Needles:  Injection technique: Single-shot  Needle Type: Echogenic Stimulator Needle     Needle Length: 9cm  Needle Gauge: 22     Additional Needles:   Procedures:,,,, ultrasound used (permanent image in chart),,,,  Narrative:  Start time: 11/10/2020 8:00 AM End time: 11/10/2020 8:05 AM Injection made incrementally with aspirations every 5 mL.  Performed by: Personally  Anesthesiologist: Pervis Hocking, DO  Additional Notes: Monitors applied. No increased pain on injection. No increased resistance to injection. Injection made in 5cc increments. Good needle visualization. Patient tolerated procedure well.

## 2020-11-10 NOTE — Plan of Care (Signed)

## 2020-11-10 NOTE — TOC Initial Note (Signed)
Transition of Care [facility]) - Initial/Assessment Note    Patient Details  Name: [name] MRN: [id number] Date of Birth: 21-May-1951  Transition of [facility] CM/SW Contact:    Curlene Labrum, RN Phone Number: 11/10/2020, 4:08 PM  Clinical Narrative:                 Case management met with the patient at the bedside to discuss transitions of care to home. The patient lives at home with her husband and currently has a raised toilet seat but does not have a rolling walker.  The patient states that she would prefer to not use crutches.  The patient is awaiting PT evaluation at this time and will continue to follow for dme and possible need for home health PT.  Will continue to follow for discharge needs.  Expected Discharge Plan: [place] Barriers to Discharge: Continued Medical Work up   Patient Goals and CMS Choice Patient states their goals for this hospitalization and ongoing recovery are:: Patient plans to discharge home with her husband. CMS Medicare.gov Compare Post Acute Care list provided to:: Patient Choice offered to / list presented to : Patient  Expected Discharge Plan and Services Expected Discharge Plan: [place]   Discharge Planning Services: CM Consult Post Acute Care Choice: Durable Medical Equipment, Home Health Living arrangements for the past 2 months: Single Family Home                                      Prior Living Arrangements/Services Living arrangements for the past 2 months: Single Family Home Lives with:: Spouse Patient language and need for interpreter reviewed:: Yes Do you feel safe going back to the place where you live?: Yes      Need for Family Participation in Patient Care: Yes (Comment) Care giver support system in place?: Yes (comment) Current home services: DME (has a raised toilet seat but no longer has a rolling walker) Criminal Activity/Legal Involvement Pertinent to Current  Situation/Hospitalization: No - Comment as needed  Activities of Daily Living Home Assistive Devices/Equipment: Eyeglasses, Dentures (specify type) ADL Screening (condition at time of admission) Patient's cognitive ability adequate to safely complete daily activities?: Yes Is the patient deaf or have difficulty hearing?: No Does the patient have difficulty seeing, even when wearing glasses/contacts?: No Does the patient have difficulty concentrating, remembering, or making decisions?: No Patient able to express need for assistance with ADLs?: Yes Does the patient have difficulty dressing or bathing?: Yes Independently performs ADLs?: Yes (appropriate for developmental age) Does the patient have difficulty walking or climbing stairs?: Yes Weakness of Legs: Left Weakness of Arms/Hands: Left  Permission Sought/Granted Permission sought to share information with : Case Manager Permission granted to share information with : Yes, Verbal Permission Granted     Permission granted to share info w AGENCY: home health or dme company  Permission granted to share info w Relationship: husband, Diahn Waidelich     Emotional Assessment Appearance:: Appears stated age Attitude/Demeanor/Rapport: Engaged Affect (typically observed): Accepting Orientation: : Oriented to Self, Oriented to Place, Oriented to  Time, Oriented to Situation Alcohol / Substance Use: Not Applicable Psych Involvement: No (comment)  Admission diagnosis:  Pain [R52] Malleolar fracture [S82.899A] Closed trimalleolar fracture of left ankle [S82.852A] Closed trimalleolar fracture of left ankle, initial encounter [Q75.916B] Patient Active Problem List   Diagnosis Date Noted  .  Closed trimalleolar fracture of left ankle 11/09/2020  . Elbow pain, left 08/31/2020  . Conductive hearing loss, bilateral 07/25/2020  . Peripheral neuropathy 06/15/2020  . Chronic kidney disease (CKD), stage III (moderate) ([employer]) 12/06/2019  . Dyslipidemia  11/25/2019  . Osteoarthritis of right knee 07/11/2019  . Swollen R ankle 07/11/2019  . Microalbuminuria 12/07/2018  . OSA (obstructive sleep apnea) 03/16/2018  . History of bladder cancer 08/24/2017  . Claudication ([place]) 07/21/2017  . Onychomycosis of toenail 06/09/2017  . Long-term (current) use of anticoagulants 02/19/2015  . Diabetes ([place]) 04/11/2014  . Atrial fibrillation ([place]) 12/18/2013  . Urinary incontinence 05/06/2011  . HLD (hyperlipidemia) 04/29/2009  . Obesity 04/29/2009  . Essential hypertension, benign 04/29/2009  . Coronary atherosclerosis 04/29/2009  . Hypothyroidism 02/23/2007  . MYOCARDIAL INFARCTION, OLD 02/23/2007  . Personal history of colonic adenoma 11/11/2004   PCP:  Benay Pike, MD Pharmacy:   [facility] [place], [place] - [address] [place] [address] Phone: [phone] Fax: [phone]     Social Determinants of Health ([place]) Interventions    Readmission Risk Interventions Readmission Risk Prevention Plan 11/10/2020  Transportation Screening Complete  PCP or Specialist Appt within 5-7 Days Complete  Home Care Screening Complete  Medication Review (RN CM) Complete  Some recent data might be hidden

## 2020-11-10 NOTE — Progress Notes (Signed)
Subjective:   Hospital day: 1  Overnight event: No acute event  Patient is seen at bedside.  She appears comfortable and in no acute distress.  She states the pain is well controlled at the moment.  Patient states that she preferred taking p.o. pain medication.  Pending PT/OT.  Objective:  Vital signs in last 24 hours: Vitals:   11/10/20 1010 11/10/20 1020 11/10/20 1025 11/10/20 1055  BP: 105/69  117/75 119/72  Pulse: (!) 102 89 87 72  Resp: 13 15 12 15   Temp:   98.2 F (36.8 C) 98.3 F (36.8 C)  TempSrc:    Oral  SpO2: 99% (!) 89% 97% 94%  Weight:      Height:       CBC Latest Ref Rng & Units 11/10/2020 11/09/2020 12/04/2019  WBC 4.0 - 10.5 K/uL 10.3 12.3(H) 8.9  Hemoglobin 12.0 - 15.0 g/dL 11.2(L) 12.0 12.5  Hematocrit 36 - 46 % 35.0(L) 38.4 37.5  Platelets 150 - 400 K/uL 255 280 279   CMP Latest Ref Rng & Units 11/10/2020 11/09/2020 12/04/2019  Glucose 70 - 99 mg/dL 119(H) 107(H) 115(H)  BUN 8 - 23 mg/dL 15 18 15   Creatinine 0.44 - 1.00 mg/dL 1.05(H) 1.24(H) 0.99  Sodium 135 - 145 mmol/L 135 136 142  Potassium 3.5 - 5.1 mmol/L 4.1 4.5 4.6  Chloride 98 - 111 mmol/L 105 103 103  CO2 22 - 32 mmol/L 22 21(L) 23  Calcium 8.9 - 10.3 mg/dL 8.7(L) 9.5 9.6  Total Protein 6.0 - 8.5 g/dL - - -  Total Bilirubin 0.0 - 1.2 mg/dL - - -  Alkaline Phos 39 - 117 IU/L - - -  AST 0 - 40 IU/L - - -  ALT 0 - 32 IU/L - - -    Physical Exam  Physical Exam Constitutional:      General: She is not in acute distress.    Appearance: She is not toxic-appearing.  HENT:     Head: Normocephalic.  Eyes:     General:        Right eye: No discharge.        Left eye: No discharge.  Cardiovascular:     Rate and Rhythm: Normal rate and regular rhythm.     Heart sounds: Normal heart sounds.  Pulmonary:     Effort: No respiratory distress.     Breath sounds: Normal breath sounds.  Abdominal:     General: Bowel sounds are normal.     Palpations: Abdomen is soft.     Tenderness: There  is no abdominal tenderness.  Musculoskeletal:     Comments: Her left toes are warm to touch.  Patient is able to wiggle her toes.  Bilateral hips stable  Skin:    General: Skin is warm.  Neurological:     Mental Status: She is alert.  Psychiatric:        Mood and Affect: Mood normal.     Assessment/Plan: Sara "Debbie" Jonesis an 69 y.o.female with PMH of arthritis, falls, HTN, HLD, A. fib on Eliquis, hypothyroidism and GERD who presented to [facility] ED for an evaluation of ankle injury after a fall and found to have closed trimalleolar fracture of the left ankle.  Status post ORIF.  Principal Problem:   Closed trimalleolar fracture of left ankle Active Problems:   Hypothyroidism   Atrial fibrillation (HCC)   Diabetes (HCC)   Chronic kidney disease (CKD), stage III (moderate) (HCC)  Closed trimalleolar fracture of the left  ankle Elderly patient with history of falls and on anticoagulation with Eliquis found to have closed fracture of the left ankle after tripping and falling off a curb.  Status post ORIF today.  We will restart Eliquis tomorrow per surgery. --Scheduled Tylenol 625 mg po 3 times daily --Norco 5-325 mg 1-2 tab po q4h prn for moderate pain --Norco 7.5-325 mg 1-2 tab po q4h prn for severe pain --We will DC IV morphine at this time.  Encourage patient to take p.o. pain medication. --IV Robaxin 500 mg q6hr prn for muscle spasm --IV or po Zofran or Reglan prn for nausea --Bowel regimen with Colace, MiraLAX and Senokot -PT/OT  AKI-improving Likely secondary to mild dehydration.  Creatinine 0.99-1.24-1.05 --Encourage p.o. intake --F/u AM BMP  --Avoid nephrotoxic agents  Chronic A. Fib Rate controlled with diltiazem. On Eliquis 5 mg twice daily. EKG shows stable A. fib with heart rate in the 80s. --Diltiazem 240 mg daily at bedtime --Restart Eliquis tomorrow  HTN Controlled and stable.  Normotensive on arrival. On lisinopril 2.5 mg daily at home. --Holding  antihypertensives for now  --Daily vitals  Hypothyroidism TSH normal at 1.43 five months ago. --Continue synthroid 100 mcg daily  HLD Stable an well-controlled. Normal lipid panel 1 year ago. --Continue crestor 40 mg daily  T2DM Well-controlled. Last A1c 6.2 five months ago. On Metformin alone. --Stable  Diet: Carb modified IVF: N/A VTE: SCDs CODE: Full  Prior to Admission Living Arrangement: Home Anticipated Discharge Location: To be determined Barriers to Discharge: PT/OT Dispo: Anticipated discharge in approximately 1-2 day(s).   Gaylan Gerold, DO 11/10/2020, 11:26 AM Pager: [phone] After 5pm on weekdays and 1pm on weekends: On Call pager [phone]

## 2020-11-10 NOTE — Plan of Care (Signed)
  Problem: Education: Goal: Knowledge of General Education information will improve Description: Including pain rating scale, medication(s)/side effects and non-pharmacologic comfort measures Outcome: Progressing   Problem: Pain Managment: Goal: General experience of comfort will improve Outcome: Progressing   Problem: Safety: Goal: Ability to remain free from injury will improve Outcome: Progressing   

## 2020-11-10 NOTE — Progress Notes (Signed)
Ortho Trauma Update  Swelling appears to be reasonable to proceed with ORIF. Did discuss with patient the small possiblity of external fixation. Patient agrees to proceed with surgery and consent was obtained.  Shona Needles, MD [facility] [phone] (office) [email or web address]

## 2020-11-10 NOTE — Transfer of Care (Signed)
Immediate Anesthesia Transfer of Care Note  Patient: [name]  Procedure(s) Performed: OPEN REDUCTION INTERNAL FIXATION (ORIF) ANKLE FRACTURE (Left Ankle)  Patient Location: PACU  Anesthesia Type:General  Level of Consciousness: awake, alert  and oriented  Airway & Oxygen Therapy: Patient Spontanous Breathing and Patient connected to face mask oxygen  Post-op Assessment: Report given to RN and Post -op Vital signs reviewed and stable  Post vital signs: Reviewed and stable  Last Vitals:  Vitals Value Taken Time  BP 93/65 11/10/20 0941  Temp    Pulse 80 11/10/20 0943  Resp 16 11/10/20 0943  SpO2 95 % 11/10/20 0943  Vitals shown include unvalidated device data.  Last Pain:  Vitals:   11/10/20 0343  TempSrc: Oral  PainSc:          Complications: No complications documented.

## 2020-11-10 NOTE — H&P (Signed)
Date: 11/10/2020               Patient Name:  [name] MRN: [id number]  DOB: Jul 21, 1951 Age / Sex: 69 y.o., female   PCP: Benay Pike, MD         Medical Service: Internal Medicine Teaching Service         Attending Physician: Dr. Aldine Contes, MD    First Contact: Dr. Gaylan Gerold Pager: [phone]  Second Contact: Dr. Mitzi Hansen Pager: [phone]       After Hours (After 5p/  First Contact Pager: [phone]  weekends / holidays): Second Contact Pager: [phone]   Chief Complaint: Fall  History of Present Illness: [name] is an 69 y.o. female with PMH of arthritis, falls, HTN, HLD, A. fib on Eliquis, hypothyroidism and GERD who presented to [facility] ED for an evaluation of ankle injury after a fall. Patient states in the afternoon, she stepped the wrong while stepping off a curb and heard/felt a pop in her left ankle. She noticed deformity of the ankle and had significant pain right away described 10/10 on the pain scale. States she was able to move her left leg but unable to put weight on her ankle. Patient denies losing her balance or hitting her head. Patient endorsed some mild back pain secondary to her arthritis. Patient denies any dizziness, headaches, blurry vision, chest pain, bleeding, abdominal pain or trouble walking. States she had a similar fall last year in which she fractured her left wrist and hand.  Patient is on anticoagulation with Eliquis.  In the ED, x-ray of the foot showed left ankle fracture. It was successfully reduced by ED provider and patient was placed in short leg splint.  Meds:  Current Meds  Medication Sig  . acetaminophen (TYLENOL) 500 MG tablet Take 500 mg by mouth every 6 (six) hours as needed for headache (pain).  . CALCIUM CARBONATE ANTACID PO Take 1 tablet by mouth at bedtime as needed for indigestion or heartburn.   . diltiazem (CARDIZEM CD) 240 MG 24 hr capsule TAKE 1 CAPSULE BY MOUTH EVERY DAY (Patient taking  differently: Take 240 mg by mouth at bedtime. )  . ELIQUIS 5 MG TABS tablet TAKE 1 TABLET(5 MG) BY MOUTH TWICE DAILY (Patient taking differently: Take 5 mg by mouth 2 (two) times daily. )  . levothyroxine (SYNTHROID) 100 MCG tablet Take 1 tablet (100 mcg total) by mouth daily.  Marland Kitchen lisinopril (ZESTRIL) 2.5 MG tablet Take 1 tablet (2.5 mg total) by mouth daily. (Patient taking differently: Take 2.5 mg by mouth at bedtime. )  . loratadine (CLARITIN) 10 MG tablet Take 10 mg by mouth daily.   . metFORMIN (GLUCOPHAGE-XR) 500 MG 24 hr tablet Take 1 tablet (500 mg total) by mouth 2 (two) times daily.  Vladimir Faster Glycol-Propyl Glycol (SYSTANE) 0.4-0.3 % SOLN Place 1 drop into both eyes daily as needed (dry eyes).  . rosuvastatin (CRESTOR) 40 MG tablet TAKE 1 TABLET(40 MG) BY MOUTH DAILY (Patient taking differently: Take 40 mg by mouth at bedtime. )     Allergies: Allergies as of 11/09/2020 - Review Complete 11/09/2020  Allergen Reaction Noted  . Penicillins Itching 05/06/2011  . Percocet [oxycodone-acetaminophen] Itching 05/06/2011   Past Medical History:  Diagnosis Date  . Anticoagulated on Coumadin   . Arthritis    wrist, hands -  . Atrial fibrillation, persistent ([place])   . Bladder tumor   . CAD (coronary artery disease) cardiologist-  dr hochrien   PTCA RCA 1999  . Cataract   . Diabetes ([place])   . Full dentures   . GERD (gastroesophageal reflux disease)   . History of colon polyps    2005  hyperplastic  . History of MI (myocardial infarction)    1999  . HLD (hyperlipidemia)   . Hypertension   . Hypothyroidism   . SUI (stress urinary incontinence, female)   . Wears glasses     Family History: Mother had diabetes and colon cancer. Father died from MI. Brothers had throat and intestinal cancers.   Social History: Lives with husband and son. Smoked 1.5 ppd of cigarette for 15 years and quite 38 years ago. Denies any ETOH or illicit drug use.   Review of Systems: A complete ROS was  negative except as per HPI.  Physical Exam: Blood pressure 121/77, pulse 80, temperature 98 F (36.7 C), temperature source Oral, resp. rate 18, height 5\' 3"  (1.6 m), weight 85.7 kg, SpO2 97 %.  General: Pleasant, well-appearing elderly woman laying in bed. No acute distress. Head: Normocephalic. Atraumatic. CV: Tachycardic Irregularly irregular. No murmurs, rubs, or gallops. No LE edema Pulmonary: Lungs CTAB. Normal effort. No wheezing or rales. Abdominal: Soft, nontender, nondistended. Normal bowel sounds. Extremities: Palpable pulses. Left foot/ankle in a splint. Able to wiggle toes.  Skin: Warm and dry. No obvious rash or lesions. Neuro: A&Ox3. 5/5 grip strength. Normal sensation. No focal deficit. Psych: Normal mood and affect  EKG: personally reviewed my interpretation is Afib, low voltage, RAD  Left foot/ankle XR: Trimalleolar fracture dislocation of the left ankle as with lateral dislocation of the talus from the tibial plafond. Diffuse soft tissue swelling of the hindfoot and ankle  CT head w/wo contrast: No acute intracranial abnormality. Mild brain parenchymal atrophy and chronic microvascular disease.  Assessment & Plan by Problem: Principal Problem:   Closed trimalleolar fracture of left ankle  [name] is an 69 y.o. female with PMH of arthritis, falls, HTN, HLD, A. fib on Eliquis, hypothyroidism and GERD who presented to [facility] ED for an evaluation of ankle injury after a fall and found to have closed trimalleolar fracture of the left ankle. Pending possible ORIF.  #Closed trimalleolar fracture of the left ankle Elderly patient with history of falls and on anticoagulation with Eliquis found to have closed fracture of the left ankle after tripping and falling off a curb. Ambulates without an assistive device at baseline. Denied loss of balance or hitting head. CT head negative for acute intracranial abnormality. Ankle successfully reduced. Neurovascularly intact on  exam. Pain well controlled. Orthopedic following and plan for possible open reduction internal fixation in the morning with improvement in swelling. --Orthopedic following, will reassess in the AM for ORIF --Holding anticoagulation --IV NS w/ KCl 20 mEq @ 50 mL/h --Tylenol 325-660 mg po prn q6h for mild pain --Norco 5-325 mg 1-2 tab po q4h prn for moderate pain --Norco 7.5-325 mg 1-2 tab po q4h prn for severe pain --IV Morphine 0.5-1 mg q2h prn for severe pain --IV Robaxin 500 mg q6hr prn for muscle spasm --IV or po Zofran or Reglan  prn for nausea --Colace 100 mg po BID  #AKI Patient found to have a mild creatinine bump to 1.24 from 0.99 eleven months ago. Likely secondary to mild dehydration. Will give fluids and monitor. --IV NS w/ KCl 20 mEq @ 50 mL/h --F/u AM BMP --Avoid nephrotoxic agents  #A. Fib Rate controlled with diltiazem. On Eliquis 5 mg twice  daily. EKG shows stable A. fib with heart rate in the 80s. --Diltiazem 240 mg daily at bedtime --Holding anticoagulation  #HTN Controlled and stable.  Normotensive on arrival. On lisinopril 2.5 mg daily at home. --Holding antihypertensives for now  --Daily vitals  #Hypothyroidism TSH normal at 1.43 five months ago. --Continue synthroid 100 mcg daily  #HLD Stable an well-controlled. Normal lipid panel 1 year ago. --Continue crestor 40 mg daily  #T2DM Well-controlled. Last A1c 6.2 five months ago. On Metformin alone. --Stable   CODE STATUS: Full Code DIET: NPO @ midnight PPx: SCDs  Dispo: Admit patient to Inpatient with expected length of stay greater than 2 midnights.  Signed: Lacinda Axon, MD 11/10/2020, 1:18 AM  Pager: [phone] Internal Medicine Teaching Service After 5pm on weekdays and 1pm on weekends: On Call pager: [phone]

## 2020-11-10 NOTE — Anesthesia Procedure Notes (Signed)
Anesthesia Regional Block: Popliteal block   Pre-Anesthetic Checklist: ,, timeout performed, Correct Patient, Correct Site, Correct Laterality, Correct Procedure, Correct Position, site marked, Risks and benefits discussed,  Surgical consent,  Pre-op evaluation,  At surgeon's request and post-op pain management  Laterality: Left  Prep: Maximum Sterile Barrier Precautions used, chloraprep       Needles:  Injection technique: Single-shot  Needle Type: Echogenic Stimulator Needle     Needle Length: 9cm  Needle Gauge: 22     Additional Needles:   Procedures:,,,, ultrasound used (permanent image in chart),,,,  Narrative:  Start time: 11/10/2020 7:55 AM End time: 11/10/2020 8:00 AM Injection made incrementally with aspirations every 5 mL.  Performed by: Personally  Anesthesiologist: Pervis Hocking, DO  Additional Notes: Monitors applied. No increased pain on injection. No increased resistance to injection. Injection made in 5cc increments. Good needle visualization. Patient tolerated procedure well.

## 2020-11-11 ENCOUNTER — Encounter (HOSPITAL_COMMUNITY): Payer: Self-pay | Admitting: Student

## 2020-11-11 DIAGNOSIS — S82852A Displaced trimalleolar fracture of left lower leg, initial encounter for closed fracture: Secondary | ICD-10-CM | POA: Diagnosis not present

## 2020-11-11 LAB — CBC
HCT: 32.4 % — ABNORMAL LOW (ref 36.0–46.0)
Hemoglobin: 10.5 g/dL — ABNORMAL LOW (ref 12.0–15.0)
MCH: 28.8 pg (ref 26.0–34.0)
MCHC: 32.4 g/dL (ref 30.0–36.0)
MCV: 89 fL (ref 80.0–100.0)
Platelets: 245 10*3/uL (ref 150–400)
RBC: 3.64 MIL/uL — ABNORMAL LOW (ref 3.87–5.11)
RDW: 13.7 % (ref 11.5–15.5)
WBC: 16.2 10*3/uL — ABNORMAL HIGH (ref 4.0–10.5)
nRBC: 0 % (ref 0.0–0.2)

## 2020-11-11 LAB — BASIC METABOLIC PANEL
Anion gap: 11 (ref 5–15)
BUN: 19 mg/dL (ref 8–23)
CO2: 22 mmol/L (ref 22–32)
Calcium: 9 mg/dL (ref 8.9–10.3)
Chloride: 104 mmol/L (ref 98–111)
Creatinine, Ser: 1.22 mg/dL — ABNORMAL HIGH (ref 0.44–1.00)
GFR, Estimated: 47 mL/min — ABNORMAL LOW (ref >60)
Glucose, Bld: 194 mg/dL — ABNORMAL HIGH (ref 70–99)
Potassium: 4.3 mmol/L (ref 3.5–5.1)
Sodium: 137 mmol/L (ref 135–145)

## 2020-11-11 LAB — GLUCOSE, CAPILLARY
Glucose-Capillary: 238 mg/dL — ABNORMAL HIGH (ref 70–99)
Glucose-Capillary: 179 mg/dL — ABNORMAL HIGH (ref 70–99)

## 2020-11-11 MED ORDER — INSULIN ASPART 100 UNIT/ML ~~LOC~~ SOLN
0.0000 [IU] | Freq: Three times a day (TID) | SUBCUTANEOUS | Status: DC
  Administered 2020-11-11: 3 [IU] via SUBCUTANEOUS
  Administered 2020-11-11: 5 [IU] via SUBCUTANEOUS

## 2020-11-11 MED ORDER — HYDROCODONE-ACETAMINOPHEN 5-325 MG PO TABS
1.0000 | ORAL_TABLET | Freq: Four times a day (QID) | ORAL | 0 refills | Status: DC | PRN
Start: 2020-11-11 — End: 2020-11-11

## 2020-11-11 MED ORDER — SODIUM CHLORIDE 0.9 % IV SOLN: INTRAVENOUS | Status: DC

## 2020-11-11 MED ORDER — HYDROCODONE-ACETAMINOPHEN 5-325 MG PO TABS
1.0000 | ORAL_TABLET | Freq: Four times a day (QID) | ORAL | 0 refills | Status: DC | PRN
Start: 2020-11-11 — End: 2021-02-24

## 2020-11-11 NOTE — Progress Notes (Addendum)
Subjective:   Hospital day: 1  Overnight event: No acute event  This morning, patient reports that she is tired due to not being able to sleep last night because of beeping of machines and blood draws. She states that her pain is "fine," but that she is still numb. She states that she worked well with physical therapy yesterday. She looks forward to working with occupational therapy later this morning and hopefully go home later today. She has no further questions or concerns, and understands and agrees with the plan for today.  Objective:  Vital signs in last 24 hours: Vitals:   11/10/20 1753 11/10/20 1951 11/11/20 0322 11/11/20 0600  BP: 99/63 100/61 101/69 93/73  Pulse: 85 73 73 73  Resp: 18 17 16 16   Temp: 97.8 F (36.6 C) 98.5 F (36.9 C) 98.2 F (36.8 C) 97.7 F (36.5 C)  TempSrc: Oral Oral  Oral  SpO2: 97% 92% 99% 98%  Weight:      Height:       CBC Latest Ref Rng & Units 11/11/2020 11/10/2020 11/09/2020  WBC 4.0 - 10.5 K/uL 16.2(H) 10.3 12.3(H)  Hemoglobin 12.0 - 15.0 g/dL 10.5(L) 11.2(L) 12.0  Hematocrit 36 - 46 % 32.4(L) 35.0(L) 38.4  Platelets 150 - 400 K/uL 245 255 280   CMP Latest Ref Rng & Units 11/11/2020 11/10/2020 11/09/2020  Glucose 70 - 99 mg/dL 194(H) 119(H) 107(H)  BUN 8 - 23 mg/dL 19 15 18   Creatinine 0.44 - 1.00 mg/dL 1.22(H) 1.05(H) 1.24(H)  Sodium 135 - 145 mmol/L 137 135 136  Potassium 3.5 - 5.1 mmol/L 4.3 4.1 4.5  Chloride 98 - 111 mmol/L 104 105 103  CO2 22 - 32 mmol/L 22 22 21(L)  Calcium 8.9 - 10.3 mg/dL 9.0 8.7(L) 9.5  Total Protein 6.0 - 8.5 g/dL - - -  Total Bilirubin 0.0 - 1.2 mg/dL - - -  Alkaline Phos 39 - 117 IU/L - - -  AST 0 - 40 IU/L - - -  ALT 0 - 32 IU/L - - -     Physical Exam  Physical Exam Constitutional:      General: She is not in acute distress.    Appearance: She is not toxic-appearing.  HENT:     Head: Normocephalic.  Eyes:     General:        Right eye: No discharge.        Left eye: No discharge.   Cardiovascular:     Rate and Rhythm: Normal rate and regular rhythm.     Heart sounds: Normal heart sounds.  Pulmonary:     Effort: No respiratory distress.     Breath sounds: Normal breath sounds.  Abdominal:     General: Bowel sounds are normal.     Palpations: Abdomen is soft.  Musculoskeletal:     Comments: Her left toes are warm to touch  Skin:    General: Skin is warm.  Neurological:     Mental Status: She is alert.  Psychiatric:        Mood and Affect: Mood normal.     Assessment/Plan: SaraJonesis an 69 y.o.female with PMH ofarthritis,falls,HTN, HLD, A. fib on Eliquis, hypothyroidism and GERD who presented to [facility] ED for an evaluation of ankle injury after a falland found to have closedtrimalleolar fracture of the left ankle.  Status post ORIF.  Principal Problem:   Closed trimalleolar fracture of left ankle Active Problems:   Hypothyroidism   Atrial fibrillation ([place])  Diabetes ([place])   Chronic kidney disease (CKD), stage III (moderate) (HCC)  Closed trimalleolar fracture of the left ankle Elderly patient with history of falls and on anticoagulationwith Eliquisfound to have closed fracture of the left ankle after tripping and falling off a curb.  Status post ORIF. Patient is doing well post surgery. Pain is well controlled with scheduled Tylenol and PRN Vicodin. Eliquis was restarted today. Patient is stable for discharge per Orthopedic. PT recommended home health PT. Wheelchair and rolling walkers ordered. TOC is working on finding home health services.  --Vicodin and Tylenol for pain management per Orthopedic --Home health PT per TOC  AKI Likely secondary to mild dehydration.  Creatinine 0.99-1.24-1.05--1.22. Likely due to NPO yesterday --Encourage p.o. intake --Avoid nephrotoxic agents --Follow up with PCP to repeat BMP  Chronic A. Fib Rate controlled with diltiazem. On Eliquis 5 mg twice daily. EKG shows stable A. fib with heart rate in the  80s. --Diltiazem 240 mg daily at bedtime --Restart Eliquis today  HTN Controlled and stable. Normotensive on arrival. On lisinopril 2.5 mg daily at home. --Resume home meds  Hypothyroidism TSH normal at 1.36fivemonths ago. --Continuesynthroid 100 mcg daily  HLD Stable an well-controlled. Normal lipid panel 1 year ago. --Continuecrestor 40 mg daily  T2DM Well-controlled.Last A1c 6.2 five months ago.On Metformin alone. CBG elevated, likely related to post operation. --F/u with PCP  Diet: Carb modified IVF: N/A VTE: SCDs CODE: Full  Prior to Admission Living Arrangement: Home Anticipated Discharge Location: Home with home health Barriers to Discharge: NA Dispo: Anticipated discharge today.  Gaylan Gerold, DO 11/11/2020, 6:27 AM Pager: [phone] After 5pm on weekdays and 1pm on weekends: On Call pager [phone]

## 2020-11-11 NOTE — Progress Notes (Signed)
ANTICOAGULATION CONSULT NOTE - Initial Consult  Pharmacy Consult:  Fracture care post-op AC (Eliquis)  Allergies  Allergen Reactions  . Penicillins Itching    Has patient had a PCN reaction causing immediate rash, facial/tongue/throat swelling, SOB or lightheadedness with hypotension: Yes Has patient had a PCN reaction causing severe rash involving mucus membranes or skin necrosis: No Has patient had a PCN reaction that required hospitalization: No Has patient had a PCN reaction occurring within the last 10 years: Yes If all of the above answers are "NO", then may proceed with Cephalosporin use.   Marland Kitchen Percocet [Oxycodone-Acetaminophen] Itching    Patient Measurements: Height: 5\' 3"  (160 cm) Weight: 85.7 kg (189 lb) IBW/kg (Calculated) : 52.4  Vital Signs: Temp: 97.7 F (36.5 C) (11/16 0600) Temp Source: Oral (11/16 0600) BP: 93/73 (11/16 0600) Pulse Rate: 73 (11/16 0600)  Labs: Recent Labs    11/09/20 1454 11/09/20 1454 11/09/20 2152 11/10/20 0246 11/11/20 0420  HGB 12.0   < >  --  11.2* 10.5*  HCT 38.4  --   --  35.0* 32.4*  PLT 280  --   --  255 245  LABPROT  --   --  16.9*  --   --   INR  --   --  1.4*  --   --   CREATININE 1.24*  --   --  1.05* 1.22*   < > = values in this interval not displayed.    Estimated Creatinine Clearance: 43.9 mL/min (A) (by C-G formula based on SCr of 1.22 mg/dL (H)).   Medical History: Past Medical History:  Diagnosis Date  . Anticoagulated on Coumadin   . Arthritis    wrist, hands -  . Atrial fibrillation, persistent ([place])   . Bladder tumor   . CAD (coronary artery disease) cardiologist-  dr hochrien   PTCA RCA 1999  . Cataract   . Diabetes ([place])   . Full dentures   . GERD (gastroesophageal reflux disease)   . History of colon polyps    2005  hyperplastic  . History of MI (myocardial infarction)    1999  . HLD (hyperlipidemia)   . Hypertension   . Hypothyroidism   . SUI (stress urinary incontinence, female)   . Wears  glasses     Assessment: 27 YOF presented s/p fall with ankle injury and underwent ORIF ankle fracture on 11/10/20.  Pharmacy consulted to resume Eliquis from PTA for history of Afib on POD1.  Patient meets criteria to resume - does not require transfusion and hemoglobin > 9 g/dL.  No bleeding reported.  Age < 51, weight > 60kg and SCr < 1.5 although trending up.  Will resume full dose.  Goal of Therapy:  Appropriate anticoagulation Monitor platelets by anticoagulation protocol: Yes   Plan:  Resume Eliquis 5mg  PO BID Pharmacy will sign off and follow peripherally.  Thank you for the consult!  Danniel Tones D. Mina Marble, PharmD, BCPS, [place] 11/11/2020, 7:26 AM

## 2020-11-11 NOTE — Care Management (Cosign Needed)
    Durable Medical Equipment  (From admission, onward)         Start     Ordered   11/11/20 1022  For home use only DME 3 n 1  Once        11/11/20 1022   11/11/20 1021  For home use only DME lightweight manual wheelchair with seat cushion  Once       Comments: Patient suffers from  Left ankle fracture which impairs their ability to perform daily activities like [id number] in the home.  A walking FXO:[id number] will not resolve  issue with performing activities of daily living. A wheelchair will allow patient to safely perform daily activities. Patient is not able to propel themselves in the home using a standard weight wheelchair due to weakness:[id number]. Patient can self propel in the lightweight wheelchair. Length of need 6 months. Accessories: elevating leg rests (ELRs), wheel locks, extensions and anti-tippers.   11/11/20 1022   11/11/20 1020  For home use only DME Walker rolling  Once       Question Answer Comment  Walker: With [place]   Patient needs a walker to treat with the following condition Ankle fracture, left      11/11/20 1022

## 2020-11-11 NOTE — Discharge Instructions (Addendum)
Orthopaedic Trauma Service Discharge Instructions   General Discharge Instructions  WEIGHT BEARING STATUS: Weightbearing for transfers on left lower extremity.  Do not want you putting any weight through her left leg when using the walker  RANGE OF MOTION/ACTIVITY: Okay to come out of the CAM boot at rest for gentle ankle motion as tolerated  Wound Care: You may remove your surgical dressings on postoperative day #2 (11/12/2020).  Incisions can be left open to air if there is no drainage. If incision continues to have drainage, follow wound care instructions below. Okay to shower if no drainage from incisions.  DVT/PE prophylaxis: Eliquis 5 mg twice daily  Diet: as you were eating previously.  Can use over the counter stool softeners and bowel preparations, such as Miralax, to help with bowel movements.  Narcotics can be constipating.  Be sure to drink plenty of fluids  PAIN MEDICATION USE AND EXPECTATIONS  You have likely been given narcotic medications to help control your pain.  After a traumatic event that results in an fracture (broken bone) with or without surgery, it is ok to use narcotic pain medications to help control one's pain.  We understand that everyone responds to pain differently and each individual patient will be evaluated on a regular basis for the continued need for narcotic medications. Ideally, narcotic medication use should last no more than 6-8 weeks (coinciding with fracture healing).   As a patient it is your responsibility as well to monitor narcotic medication use and report the amount and frequency you use these medications when you come to your office visit.   We would also advise that if you are using narcotic medications, you should take a dose prior to therapy to maximize you participation.  IF YOU ARE ON NARCOTIC MEDICATIONS IT IS NOT PERMISSIBLE TO OPERATE A MOTOR VEHICLE (MOTORCYCLE/CAR/TRUCK/MOPED) OR HEAVY MACHINERY DO NOT MIX NARCOTICS WITH OTHER CNS  (CENTRAL NERVOUS SYSTEM) DEPRESSANTS SUCH AS ALCOHOL   STOP SMOKING OR USING NICOTINE PRODUCTS!!!!  As discussed nicotine severely impairs your body's ability to heal surgical and traumatic wounds but also impairs bone healing.  Wounds and bone heal by forming microscopic blood vessels (angiogenesis) and nicotine is a vasoconstrictor (essentially, shrinks blood vessels).  Therefore, if vasoconstriction occurs to these microscopic blood vessels they essentially disappear and are unable to deliver necessary nutrients to the healing tissue.  This is one modifiable factor that you can do to dramatically increase your chances of healing your injury.    (This means no smoking, no nicotine gum, patches, etc)  DO NOT USE NONSTEROIDAL ANTI-INFLAMMATORY DRUGS (NSAID'S)  Using products such as Advil (ibuprofen), Aleve (naproxen), Motrin (ibuprofen) for additional pain control during fracture healing can delay and/or prevent the healing response.  If you would like to take over the counter (OTC) medication, Tylenol (acetaminophen) is ok.  However, some narcotic medications that are given for pain control contain acetaminophen as well. Therefore, you should not exceed more than 4000 mg of tylenol in a day if you do not have liver disease.  Also note that there are may OTC medicines, such as cold medicines and allergy medicines that my contain tylenol as well.  If you have any questions about medications and/or interactions please ask your doctor/PA or your pharmacist.      ICE AND ELEVATE INJURED/OPERATIVE EXTREMITY  Using ice and elevating the injured extremity above your heart can help with swelling and pain control.  Icing in a pulsatile fashion, such as 20 minutes on  and 20 minutes off, can be followed.    Do not place ice directly on skin. Make sure there is a barrier between to skin and the ice pack.    Using frozen items such as frozen peas works well as the conform nicely to the are that needs to be  iced.  USE AN ACE WRAP OR TED HOSE FOR SWELLING CONTROL  In addition to icing and elevation, Ace wraps or TED hose are used to help limit and resolve swelling.  It is recommended to use Ace wraps or TED hose until you are informed to stop.    When using Ace Wraps start the wrapping distally (farthest away from the body) and wrap proximally (closer to the body)   Example: If you had surgery on your leg or thing and you do not have a splint on, start the ace wrap at the toes and work your way up to the thigh        If you had surgery on your upper extremity and do not have a splint on, start the ace wrap at your fingers and work your way up to the upper arm   IF YOU ARE IN A CAM BOOT (BLACK BOOT)  You may remove boot periodically. Perform daily dressing changes as noted below.  Wash the liner of the boot regularly and wear a sock when wearing the boot. It is recommended that you sleep in the boot until told otherwise   CALL THE OFFICE WITH ANY QUESTIONS OR CONCERNS: [phone]   VISIT OUR WEBSITE FOR ADDITIONAL INFORMATION: [email or web address]   Discharge Wound Care Instructions  Do NOT apply any ointments, solutions or lotions to pin sites or surgical wounds.  These prevent needed drainage and even though solutions like hydrogen peroxide kill bacteria, they also damage cells lining the pin sites that help fight infection.  Applying lotions or ointments can keep the wounds moist and can cause them to breakdown and open up as well. This can increase the risk for infection. When in doubt call the office.  Surgical incisions should be dressed daily.  If any drainage is noted, use one layer of adaptic, then gauze, Kerlix, and an ace wrap.  Once the incision is completely dry and without drainage, it may be left open to air out.  Showering may begin 36-48 hours later.  Cleaning gently with soap and water.  Traumatic wounds should be dressed daily as well.    One layer of adaptic, gauze, Kerlix,  then ace wrap.  The adaptic can be discontinued once the draining has ceased    If you have a wet to dry dressing: wet the gauze with saline the squeeze as much saline out so the gauze is moist (not soaking wet), place moistened gauze over wound, then place a dry gauze over the moist one, followed by Kerlix wrap, then ace wrap.     Information on my medicine - ELIQUIS (apixaban)  This medication education was reviewed with me or my healthcare representative as part of my discharge preparation.   Why was Eliquis prescribed for you? Eliquis was prescribed for you to reduce the risk of a blood clot forming that can cause a stroke if you have a medical condition called atrial fibrillation (a type of irregular heartbeat).  What do You need to know about Eliquis ? Take your Eliquis TWICE DAILY - one tablet in the morning and one tablet in the evening with or without food. If you have difficulty swallowing the  tablet whole please discuss with your pharmacist how to take the medication safely.  Take Eliquis exactly as prescribed by your doctor and DO NOT stop taking Eliquis without talking to the doctor who prescribed the medication.  Stopping may increase your risk of developing a stroke.  Refill your prescription before you run out.  After discharge, you should have regular check-up appointments with your healthcare provider that is prescribing your Eliquis.  In the future your dose may need to be changed if your kidney function or weight changes by a significant amount or as you get older.  What do you do if you miss a dose? If you miss a dose, take it as soon as you remember on the same day and resume taking twice daily.  Do not take more than one dose of ELIQUIS at the same time to make up a missed dose.  Important Safety Information A possible side effect of Eliquis is bleeding. You should call your healthcare provider right away if you experience any of the following: ? Bleeding  from an injury or your nose that does not stop. ? Unusual colored urine (red or dark brown) or unusual colored stools (red or black). ? Unusual bruising for unknown reasons. ? A serious fall or if you hit your head (even if there is no bleeding).  Some medicines may interact with Eliquis and might increase your risk of bleeding or clotting while on Eliquis. To help avoid this, consult your healthcare provider or pharmacist prior to using any new prescription or non-prescription medications, including herbals, vitamins, non-steroidal anti-inflammatory drugs (NSAIDs) and supplements.  This website has more information on Eliquis (apixaban): http://www.eliquis.com/eliquis/home

## 2020-11-11 NOTE — TOC Progression Note (Signed)
Transition of Care [facility]) - Progression Note    Patient Details  Name: [name] MRN: [id number] Date of Birth: August 01, 1951  Transition of Care [facility]) CM/SW [place], RN Phone Number: 11/11/2020, 10:23 AM  Clinical Narrative:    Case management spoke with [facility], [facility], and [facility] and they were unable to provide home health services due to staffing.  I called [facility] and they are checking to see if they could provide home health for the patient.    PT met with the patient for evaluation yesterday and dme needs were noted.  I spoke with the patient this morning and 3:1, wheelchair, and rolling walker will be ordered for the patient through [employer] and be delivered to the patient's room.  I will continue to follow the patient for discharge needs.   Expected Discharge Plan: [place] Barriers to Discharge: Continued Medical Work up  Expected Discharge Plan and Services Expected Discharge Plan: [place]   Discharge Planning Services: CM Consult Post Acute Care Choice: Durable Medical Equipment, Home Health Living arrangements for the past 2 months: Single Family Home                                       Social Determinants of Health (SDOH) Interventions    Readmission Risk Interventions Readmission Risk Prevention Plan 11/10/2020  Transportation Screening Complete  PCP or Specialist Appt within 5-7 Days Complete  Home Care Screening Complete  Medication Review (RN CM) Complete  Some recent data might be hidden

## 2020-11-11 NOTE — Progress Notes (Addendum)
Orthopaedic Trauma Progress Note  SUBJECTIVE: Reports no pain about operative site, regional nerve block still in effect. Did have a little bit of tingling in her foot earlier.  Is concerned that her blood sugar was elevated at 238 this morning but denies any related symptoms. No chest pain. No SOB. No nausea/vomiting. No dizziness. No other complaints. Husband at bedside. Patient worked with physical therapy yesterday afternoon, did well with this. Feels she is ready to go home today. Asking about using a knee scooter which she already has at home.   OBJECTIVE:  Vitals:   11/11/20 0600 11/11/20 0753  BP: 93/73 (!) 105/55  Pulse: 73 72  Resp: 16 17  Temp: 97.7 F (36.5 C) 97.8 F (36.6 C)  SpO2: 98% 91%    General: Sitting up in bed, NAD Respiratory: No increased work of breathing.  Left lower extremity: Dressing CDI. CAM boot in place. No sensation or motor function currently due to regional block still being in place. Foot warm and well perfused. +DP pulse  IMAGING: Stable post op imaging.   LABS:  Results for orders placed or performed during the hospital encounter of 11/09/20 (from the past 24 hour(s))  Glucose, capillary     Status: Abnormal   Collection Time: 11/10/20 10:08 AM  Result Value Ref Range   Glucose-Capillary 137 (H) 70 - 99 mg/dL  CBC     Status: Abnormal   Collection Time: 11/11/20  4:20 AM  Result Value Ref Range   WBC 16.2 (H) 4.0 - 10.5 K/uL   RBC 3.64 (L) 3.87 - 5.11 MIL/uL   Hemoglobin 10.5 (L) 12.0 - 15.0 g/dL   HCT 32.4 (L) 36 - 46 %   MCV 89.0 80.0 - 100.0 fL   MCH 28.8 26.0 - 34.0 pg   MCHC 32.4 30.0 - 36.0 g/dL   RDW 13.7 11.5 - 15.5 %   Platelets 245 150 - 400 K/uL   nRBC 0.0 0.0 - 0.2 %  Basic metabolic panel     Status: Abnormal   Collection Time: 11/11/20  4:20 AM  Result Value Ref Range   Sodium 137 135 - 145 mmol/L   Potassium 4.3 3.5 - 5.1 mmol/L   Chloride 104 98 - 111 mmol/L   CO2 22 22 - 32 mmol/L   Glucose, Bld 194 (H) 70 - 99  mg/dL   BUN 19 8 - 23 mg/dL   Creatinine, Ser 1.22 (H) 0.44 - 1.00 mg/dL   Calcium 9.0 8.9 - 10.3 mg/dL   GFR, Estimated 47 (L) >60 mL/min   Anion gap 11 5 - 15  Glucose, capillary     Status: Abnormal   Collection Time: 11/11/20  8:38 AM  Result Value Ref Range   Glucose-Capillary 238 (H) 70 - 99 mg/dL    ASSESSMENT: NALEE LIGHTLE is a 69 y.o. female status post fall, 1 Day Post-Op  Injuries: Left trimalleolar ankle fracture s/p Procedure(s): OPEN REDUCTION INTERNAL FIXATION (ORIF) ANKLE FRACTURE  CV/Blood loss: Acute blood loss anemia, Hgb 10.5 this morning.  BP a little soft, will add some fluids  PLAN: Weightbearing: Weightbearing for transfers LLE.  No walker ambulation LLE Incisional and dressing care: Plan to change dressing tomorrow if patient still in hospital, otherwise patient has been given instructions for home wound care Showering: Okay to begin showering with assistance 11/13/2020 Orthopedic device(s): CAM boot LLE. Ok to come out of boot at rest for gentle ankle motion as tolerated Pain management:  1. Tylenol 650  mg q 6 hours scheduled 2. Robaxin 500 mg q 6 hours PRN 3. Norco 5-325 mg q 4 hours PRN moderate pain OR Norco 7.5-325 mg q 4 hours PRN severe pain  VTE prophylaxis: Restart Eliquis today, SCDs ID:  Ancef 2gm post op Foley/Lines:  No foley, KVO IVFs Impediments to Fracture Healing: Vitamin D level pending, will start supplementation as indicated Dispo: Therapies as tolerated, PT recommending home health.  Okay for discharge from ortho standpoint today once cleared by medicine team and therapies. I have sent d/c rx for pain medication to pharmacy on file.  Follow - up plan: 2 weeks for suture removal and repeat x-rays  Contact information:  Katha Hamming MD, Patrecia Pace PA-C. After hours and holidays please check [email or web address] for group call information for [facility]   Maryln Eastham A. Ricci Barker, PA-C [phone] (office) [email or web address]

## 2020-11-11 NOTE — Discharge Summary (Addendum)
Name: [name] MRN: [id number] DOB: October 31, 1951 69 y.o. PCP: Benay Pike, MD  Date of Admission: 11/09/2020  2:11 PM Date of Discharge: 11/11/2020 Attending Physician: Oda Kilts, MD  Discharge Diagnosis: 1.  Trimalleolar fracture dislocation of left ankle 2.  AKI  3.  Chronic atrial fibrillation  Discharge Medications: Allergies as of 11/11/2020       Reactions   Penicillins Itching   Has patient had a PCN reaction causing immediate rash, facial/tongue/throat swelling, SOB or lightheadedness with hypotension: Yes Has patient had a PCN reaction causing severe rash involving mucus membranes or skin necrosis: No Has patient had a PCN reaction that required hospitalization: No Has patient had a PCN reaction occurring within the last 10 years: Yes If all of the above answers are "NO", then may proceed with Cephalosporin use.   Percocet [oxycodone-acetaminophen] Itching        Medication List     STOP taking these medications    traMADol 50 MG tablet Commonly known as: ULTRAM       TAKE these medications    acetaminophen 500 MG tablet Commonly known as: TYLENOL Take 500 mg by mouth every 6 (six) hours as needed for headache (pain).   CALCIUM CARBONATE ANTACID PO Take 1 tablet by mouth at bedtime as needed for indigestion or heartburn. Notes to patient: Resume home regimen   diltiazem 240 MG 24 hr capsule Commonly known as: CARDIZEM CD TAKE 1 CAPSULE BY MOUTH EVERY DAY What changed:  how much to take when to take this   Eliquis 5 MG Tabs tablet Generic drug: apixaban TAKE 1 TABLET(5 MG) BY MOUTH TWICE DAILY What changed: See the new instructions.   gabapentin 100 MG capsule Commonly known as: NEURONTIN 100mg  in AM and afternoon, and 300mg  at night. Notes to patient: Resume home regimen   HYDROcodone-acetaminophen 5-325 MG tablet Commonly known as: NORCO/VICODIN Take 1-2 tablets by mouth every 6 (six) hours as needed for severe  pain. What changed: reasons to take this   levothyroxine 100 MCG tablet Commonly known as: SYNTHROID Take 1 tablet (100 mcg total) by mouth daily.   lisinopril 2.5 MG tablet Commonly known as: ZESTRIL Take 1 tablet (2.5 mg total) by mouth daily. What changed: when to take this Notes to patient: Resume home regimen   loratadine 10 MG tablet Commonly known as: CLARITIN Take 10 mg by mouth daily. Notes to patient: Resume home regimen   metFORMIN 500 MG 24 hr tablet Commonly known as: GLUCOPHAGE-XR Take 1 tablet (500 mg total) by mouth 2 (two) times daily. Notes to patient: Resume home regimen   OneTouch Delica Lancets Fine Misc TEST TWICE DAILY( 8AM AND 10PM) What changed: See the new instructions.   rosuvastatin 40 MG tablet Commonly known as: CRESTOR TAKE 1 TABLET(40 MG) BY MOUTH DAILY What changed: See the new instructions.   Systane 0.4-0.3 % Soln Generic drug: Polyethyl Glycol-Propyl Glycol Place 1 drop into both eyes daily as needed (dry eyes). Notes to patient: Resume home regimen               Durable Medical Equipment  (From admission, onward)           Start     Ordered   11/11/20 1022  For home use only DME 3 n 1  Once        11/11/20 1022   11/11/20 1021  For home use only DME lightweight manual wheelchair with seat cushion  Once  Comments: Patient suffers from  Left ankle fracture which impairs their ability to perform daily activities like [id number] in the home.  A walking [id number] will not resolve  issue with performing activities of daily living. A wheelchair will allow patient to safely perform daily activities. Patient is not able to propel themselves in the home using a standard weight wheelchair due to [id number]. Patient can self propel in the lightweight wheelchair. Length of need 6 months. Accessories: elevating leg rests (ELRs), wheel locks, extensions and anti-tippers.   11/11/20 1022   11/11/20 1020  For home use only DME  Walker rolling  Once       Question Answer Comment  Walker: With [place]   Patient needs a walker to treat with the following condition Ankle fracture, left      11/11/20 1022              Discharge Care Instructions  (From admission, onward)           Start     Ordered   11/11/20 0000  Discharge wound care:       Comments: Per wound care   11/11/20 1304            Disposition and follow-up:   Ms.[name] was discharged from [facility] in Stable condition.  At the hospital follow up visit please address:  1.   Orthopedic -2 weeks follow-up for suture removal and repeat x-ray  Primary care physician -Repeat BMP confirm resolution of AKI  2.  Labs / imaging needed at time of follow-up: BMP, repeat x-ray  3.  Pending labs/ test needing follow-up: N/A  Follow-up Appointments:  Follow-up Information     Haddix, Thomasene Lot, MD. Schedule an appointment as soon as possible for a visit in 2 week(s).   Specialty: Orthopedic Surgery Why: Suture removal and repeat x-rays Contact information: [address] [place] [address] [phone]         [facility] Follow up.   Why: [facility] will be providing you with a 3:1, rolling walker, and wheelchair.  They will be delivered to your room prior to your discharge home. Contact information: [address] [place] [address] [phone]         Benay Pike, MD. Schedule an appointment as soon as possible for a visit.   Specialty: Family Medicine Contact information: [address] [address] [phone]         [facility] Follow up.   Specialty: Home Health Services Why: [facility] will be providing you with home health physical therapy.  They will call you in the next 24-48 hours to set up therapy times. Contact information: [address] [address] [phone]                  Hospital Course by problem list: 1.   Trimalleolar fracture dislocation of left ankle Patient presented to the ED after a mechanical fall which she hit the curb and slipped.  She experience significant pain in the left go right after.  She denies hitting her head or loss of consciousness.  CT head shows no acute intracranial abnormality.  X-ray of left ankle show trimalleolar fracture dislocation with lateral dislocation of the talus from the tibial platfond.  Orthopedic surgery was consulted and performed ORIF on 11/10/2020.  Patient did well postop.  Pain was controlled with Tylenol and Vicodin.  Eliquis was held on admission and restarted postop day 1.  Patient will go home with home health physical therapy.  She will need a wheelchair and rolling walker to assist with ambulation at home.  Patient will follow up with orthopedic in 2 weeks for suture removal and repeat x-ray.  Acute kidney injury Patient presented with a mild AKI likely due to dehydration.  Her creatinine improved with p.o. intake of fluids.  Creatinine trend 0.99-1.24-1.05-1.22.  Patient will follow up with her PCP to have a repeat BMP to confirm resolution of AKI.  Discharge Vitals:   BP (!) 101/54 (BP Location: Left Arm)   Pulse 75   Temp 97.8 F (36.6 C) (Oral)   Resp 17   Ht 5\' 3"  (1.6 m)   Wt 85.7 kg   LMP  (LMP Unknown)   SpO2 92%   BMI 33.48 kg/m   Pertinent Labs, Studies, and Procedures:  CBC Latest Ref Rng & Units 11/11/2020 11/10/2020 11/09/2020  WBC 4.0 - 10.5 K/uL 16.2(H) 10.3 12.3(H)  Hemoglobin 12.0 - 15.0 g/dL 10.5(L) 11.2(L) 12.0  Hematocrit 36 - 46 % 32.4(L) 35.0(L) 38.4  Platelets 150 - 400 K/uL 245 255 280   CMP Latest Ref Rng & Units 11/11/2020 11/10/2020 11/09/2020  Glucose 70 - 99 mg/dL 194(H) 119(H) 107(H)  BUN 8 - 23 mg/dL 19 15 18   Creatinine 0.44 - 1.00 mg/dL 1.22(H) 1.05(H) 1.24(H)  Sodium 135 - 145 mmol/L 137 135 136  Potassium 3.5 - 5.1 mmol/L 4.3 4.1 4.5  Chloride  98 - 111 mmol/L 104 105 103  CO2 22 - 32 mmol/L 22 22 21(L)  Calcium 8.9 - 10.3 mg/dL 9.0 8.7(L) 9.5  Total Protein 6.0 - 8.5 g/dL - - -  Total Bilirubin 0.0 - 1.2 mg/dL - - -  Alkaline Phos 39 - 117 IU/L - - -  AST 0 - 40 IU/L - - -  ALT 0 - 32 IU/L - - -   DG Ankle Complete Left  Result Date: 11/10/2020 CLINICAL DATA:  Postop fixation. EXAM: LEFT ANKLE COMPLETE - 3+ VIEW COMPARISON:  11/09/2020 FINDINGS: Reduction of patient's previously seen tibiotalar dislocation with normal alignment over the ankle mortise. 2 orthopedic screws from inferior to superior fixating patient's medial malleolar fracture as hardware is intact with anatomic alignment over the fracture site. Lateral fixation plate and screws bridging patient's distal fibular fracture with hardware intact and anatomic alignment over the fracture site. Remainder of the exam is unchanged. IMPRESSION: Hardware intact and anatomic alignment over the medial malleolar and distal fibular fractures post fixation. Normal alignment over the ankle mortise with interval reduction of the previously seen tibiotalar dislocation. Electronically Signed   By: Marin Olp M.D.   On: 11/10/2020 11:59   DG Ankle Complete Left  Result Date: 11/10/2020 CLINICAL DATA:  Ankle fracture repair EXAM: LEFT ANKLE COMPLETE - 3+ VIEW; DG C-ARM 1-60 MIN COMPARISON:  Yesterday FINDINGS: Four intraprocedural fluoroscopic images show reduction and then fixation of bimalleolar fractures with medial screws and lateral plate. On the final images the alignment is anatomic. IMPRESSION: Fluoroscopy for ankle fracture ORIF.  No unexpected finding. Electronically Signed   By: Monte Fantasia M.D.   On: 11/10/2020 09:48   DG Ankle Complete Left  Result Date: 11/09/2020 CLINICAL DATA:  Twisting injury, fall, pain EXAM: LEFT FOOT - COMPLETE 3+ VIEW; LEFT ANKLE COMPLETE - 3+ VIEW COMPARISON:  None. FINDINGS: Left ankle: Frontal, oblique, lateral views demonstrate trimalleolar  fracture dislocation of the left ankle.  There is an oblique mildly comminuted lateral malleolar fracture with significant lateral translation and angulation. Coronal E oriented posterior malleolar fracture is displaced posteriorly. Transverse medial malleolar fracture with significant lateral translation. There is lateral dislocation of the talus from the tibial plafond. Diffuse soft tissue edema. Left foot: Frontal, oblique, and lateral views of the left foot are obtained. Trimalleolar fracture dislocation of the left ankle again noted. No fractures of the left foot. Diffuse soft tissue swelling of the ankle and hindfoot. IMPRESSION: 1. Trimalleolar fracture dislocation of the left ankle as above, with lateral dislocation of the talus from the tibial plafond. 2. Diffuse soft tissue swelling of the hindfoot and ankle. Electronically Signed   By: Randa Ngo M.D.   On: 11/09/2020 15:33   CT Head Wo Contrast  Result Date: 11/09/2020 CLINICAL DATA:  Head trauma. EXAM: CT HEAD WITHOUT CONTRAST TECHNIQUE: Contiguous axial images were obtained from the base of the skull through the vertex without intravenous contrast. COMPARISON:  May 07, 2018 FINDINGS: Brain: No evidence of acute infarction, hemorrhage, hydrocephalus, extra-axial collection or mass lesion/mass effect. Mild brain parenchymal volume loss and deep white matter microangiopathy. Vascular: Calcific atherosclerotic disease of the intra cavernous carotid arteries. Skull: Normal. Negative for fracture or focal lesion. Sinuses/Orbits: No acute finding. Other: None. IMPRESSION: 1. No acute intracranial abnormality. 2. Mild brain parenchymal atrophy and chronic microvascular disease. Electronically Signed   By: Fidela Salisbury M.D.   On: 11/09/2020 15:45   DG Ankle Left Port  Result Date: 11/09/2020 CLINICAL DATA:  Post reduction. EXAM: PORTABLE LEFT ANKLE - 2 VIEW COMPARISON:  November 09, 2020 FINDINGS: Post reduction and splinting of left  trimalleolar fracture with improved alignment of the fracture lines. IMPRESSION: Post reduction and splinting of left trimalleolar fracture with improved alignment of the fracture lines. Electronically Signed   By: Fidela Salisbury M.D.   On: 11/09/2020 17:36   DG Foot Complete Left  Result Date: 11/09/2020 CLINICAL DATA:  Twisting injury, fall, pain EXAM: LEFT FOOT - COMPLETE 3+ VIEW; LEFT ANKLE COMPLETE - 3+ VIEW COMPARISON:  None. FINDINGS: Left ankle: Frontal, oblique, lateral views demonstrate trimalleolar fracture dislocation of the left ankle. There is an oblique mildly comminuted lateral malleolar fracture with significant lateral translation and angulation. Coronal E oriented posterior malleolar fracture is displaced posteriorly. Transverse medial malleolar fracture with significant lateral translation. There is lateral dislocation of the talus from the tibial plafond. Diffuse soft tissue edema. Left foot: Frontal, oblique, and lateral views of the left foot are obtained. Trimalleolar fracture dislocation of the left ankle again noted. No fractures of the left foot. Diffuse soft tissue swelling of the ankle and hindfoot. IMPRESSION: 1. Trimalleolar fracture dislocation of the left ankle as above, with lateral dislocation of the talus from the tibial plafond. 2. Diffuse soft tissue swelling of the hindfoot and ankle. Electronically Signed   By: Randa Ngo M.D.   On: 11/09/2020 15:33   DG C-Arm 1-60 Min  Result Date: 11/10/2020 CLINICAL DATA:  Ankle fracture repair EXAM: LEFT ANKLE COMPLETE - 3+ VIEW; DG C-ARM 1-60 MIN COMPARISON:  Yesterday FINDINGS: Four intraprocedural fluoroscopic images show reduction and then fixation of bimalleolar fractures with medial screws and lateral plate. On the final images the alignment is anatomic. IMPRESSION: Fluoroscopy for ankle fracture ORIF.  No unexpected finding. Electronically Signed   By: Monte Fantasia M.D.   On: 11/10/2020 09:48   Discharge  Instructions: Discharge Instructions     Call MD for:  persistant nausea and  vomiting   Complete by: As directed    Call MD for:  severe uncontrolled pain   Complete by: As directed    Diet - low sodium heart healthy   Complete by: As directed    Discharge instructions   Complete by: As directed    Ms. Barnard, it is a pleasure taking care of you during this admission.  You were hospitalized for a ankle fracture and had a open reduction internal fixation surgery with orthopedics.  -Please take Norco/Vicodin and Tylenol as needed for pain.  Please follow instructions per orthopedics. -You can obtain MiraLAX over-the-counter to help with bowel movements.  The pain medication may make you constipated. -Please follow-up with orthopedics outpatient in 2 weeks -Please follow-up with your primary care physician.  Take care   Discharge wound care:   Complete by: As directed    Per wound care   Increase activity slowly   Complete by: As directed        Signed: Gaylan Gerold, DO 11/11/2020, 1:26 PM   Pager: [phone]

## 2020-11-11 NOTE — Progress Notes (Addendum)
Provided discharge education/instructions, all questions and concerns addressed, Pt not in distress, discharged home on a wheelchair, with her RW, [facility] and belongings accompanied by husband.  Dressing to L ankle clean, dry and intact, cam walker in place.

## 2020-11-11 NOTE — Progress Notes (Signed)
Occupational Therapy Evaluation Patient Details Name: [name] MRN: [id number] DOB: 09-12-1951 Today's Date: 11/11/2020    History of Present Illness Pt is a 69 y.o. F with significant PMH of HTN, A. fib, who presents after a fall off a curb with left trimalleolar ankle fracture. Now s/p ORIF.    Clinical Impression   PTA pt PLOF living at home with husband, I with ADLs and IADLs, with interest in reading and crossword puzzles. Pt currently limited with safe ADL engagement due to pain, limited stability due to WB precautions, and safety with functional transfers. Pt demonstrate Mod I to Min guard with OT eval, and set for DC. Educated of tub bench for home environment. DC recommendation to home with HHOT to maximize independence with ADLs and to safely return to PLOF.     Follow Up Recommendations  Home health OT;Supervision - Intermittent    Equipment Recommendations  Tub/shower bench    Recommendations for Other Services       Precautions / Restrictions Precautions Precautions: Fall;Other (comment) Precaution Comments: watch HR Required Braces or Orthoses: Other Brace Other Brace: L CAM boot Restrictions Weight Bearing Restrictions: Yes LLE Weight Bearing: Non weight bearing Other Position/Activity Restrictions: WB for transfers only, no walker ambulation      Mobility Bed Mobility Overal bed mobility: Modified Independent Bed Mobility: Supine to Sit;Sit to Supine     Supine to sit: Modified independent (Device/Increase time) Sit to supine: Modified independent (Device/Increase time)   General bed mobility comments: no physical assist needed, HOB flat    Transfers Overall transfer level: Needs assistance Equipment used: Rolling walker (2 wheeled) Transfers: Sit to/from [facility];Lateral/Scoot Transfers Sit to Stand: Min guard Stand pivot transfers: Min guard      Lateral/Scoot Transfers: Supervision;Min guard General transfer comment:  min guard assist and min cues for safety with RW and sequencing/NWB precs; pt with good carryover of cues; EOB<>WC    Balance Overall balance assessment: Needs assistance Sitting-balance support: Feet supported Sitting balance-Leahy Scale: Good     Standing balance support: Bilateral upper extremity supported Standing balance-Leahy Scale: Poor Standing balance comment: needs BUE support of RW due to WB restrictions                           ADL either performed or assessed with clinical judgement   ADL Overall ADL's : Needs assistance/impaired Eating/Feeding: Independent   Grooming: Wash/dry hands;Wash/dry face;Oral care;Set up;Sitting   Upper Body Bathing: Modified independent;Sitting   Lower Body Bathing: Modified independent;Set up;Sitting/lateral leans   Upper Body Dressing : Modified independent;Sitting   Lower Body Dressing: Min guard;Minimal assistance Lower Body Dressing Details (indicate cue type and reason): No physical assistance required mostly for safety for stability to manage clothing.  Toilet Transfer: Magazine features editor Details (indicate cue type and reason): simulated toilet transfer from bed<>bed.                 Vision         Perception     Praxis      Pertinent Vitals/Pain Pain Assessment: Faces Pain Score: 0-No pain Faces Pain Scale: No hurt Pain Location: posterior LLE Pain Descriptors / Indicators: Guarding Pain Intervention(s): Monitored during session;Repositioned (CAM boot applied once back in bed.)     Hand Dominance Right   Extremity/Trunk Assessment Upper Extremity Assessment Upper Extremity Assessment: Overall WFL for tasks assessed   Lower Extremity Assessment Lower Extremity Assessment: Defer to  PT evaluation   Cervical / Trunk Assessment Cervical / Trunk Assessment: Normal   Communication Communication Communication: No difficulties   Cognition Arousal/Alertness: Awake/alert Behavior During  Therapy: WFL for tasks assessed/performed Overall Cognitive Status: Within Functional Limits for tasks assessed                                     General Comments       Exercises     Shoulder Instructions      Home Living Family/patient expects to be discharged to:: Private residence Living Arrangements: Spouse/significant other;Children (husband, son) Available Help at Discharge: Family Type of Home: House Home Access: Other (comment) (very small threshold)     Home Layout: One level     Bathroom Shower/Tub: Teacher, early years/pre: Standard     Home Equipment: None;Shower seat;Toilet riser (possible shower seat)          Prior Functioning/Environment Level of Independence: Independent        Comments: Retired, enjoys Engineer, structural and reading        OT Problem List: Impaired balance (sitting and/or standing);Decreased safety awareness;Pain;Decreased knowledge of use of DME or AE      OT Treatment/Interventions:      OT Goals(Current goals can be found in the care plan section) Acute Rehab OT Goals Patient Stated Goal: did not state; agreeable to therapy OT Goal Formulation: With patient Time For Goal Achievement: 11/25/20 Potential to Achieve Goals: Good  OT Frequency:     Barriers to D/C:            Co-evaluation              AM-PAC OT "6 Clicks" Daily Activity     Outcome Measure Help from another person eating meals?: None Help from another person taking care of personal grooming?: A Little Help from another person toileting, which includes using toliet, bedpan, or urinal?: A Little Help from another person bathing (including washing, rinsing, drying)?: A Little Help from another person to put on and taking off regular upper body clothing?: None Help from another person to put on and taking off regular lower body clothing?: A Little 6 Click Score: 20   End of Session Nurse Communication: Mobility status;Weight  bearing status  Activity Tolerance: Patient tolerated treatment well Patient left: in bed;with call bell/phone within reach  OT Visit Diagnosis: Unsteadiness on feet (R26.81);Muscle weakness (generalized) (M62.81);Pain Pain - Right/Left: Left Pain - part of body: Ankle and joints of foot                Time: 1335-1402 OT Time Calculation (min): 27 min Charges:  OT General Charges $OT Visit: 1 Visit OT Evaluation $OT Eval Low Complexity: 1 Low OT Treatments $Self Care/Home Management : 8-22 mins  Minus Breeding, MSOT, OTR/L  Supplemental Rehabilitation Services  [phone]   Marius Ditch 11/11/2020, 2:41 PM

## 2020-11-11 NOTE — Plan of Care (Signed)

## 2020-11-11 NOTE — Progress Notes (Signed)
Physical Therapy Treatment Patient Details Name: [name] MRN: [id number] DOB: [birth year] Today's Date: 11/11/2020    History of Present Illness Pt is a 69 y.o. F with significant PMH of HTN, A. fib, who presents after a fall off a curb with left trimalleolar ankle fracture. Now s/p ORIF.     PT Comments    Pt supine on arrival, agreeable to therapy session, with excellent participation and good progress toward functional mobility goals. Pt now modI for bed mobility and up to min guard assist for transfers to/from EOB<>wheelchair. Pt with good compliance with WB restrictions as detailed below. Pt propelled wheelchair in hallway ~220 ft with Supervision/cues and good safety awareness after initial instruction. Pt continues to benefit from PT services to progress toward functional mobility goals. D/C recs below remain appropriate, anticipate safe to D/C home with family assist once medically cleared, will continue to follow acutely.   Follow Up Recommendations  Home health PT;Supervision for mobility/OOB     Equipment Recommendations  Rolling walker with 5" wheels;3in1 (PT);Wheelchair (measurements PT);Wheelchair cushion (measurements PT)    Recommendations for Other Services       Precautions / Restrictions Precautions Precautions: Fall;Other (comment) Precaution Comments: watch HR Restrictions Weight Bearing Restrictions: Yes LLE Weight Bearing: Non weight bearing Other Position/Activity Restrictions: WB for transfers only, no walker ambulation    Mobility  Bed Mobility Overal bed mobility: Modified Independent Bed Mobility: Supine to Sit;Sit to Supine     Supine to sit: Modified independent (Device/Increase time) Sit to supine: Modified independent (Device/Increase time)   General bed mobility comments: no physical assist needed, HOB flat  Transfers Overall transfer level: Needs assistance Equipment used: Rolling walker (2 wheeled) Transfers: Sit to/from  [facility] Sit to Stand: Min guard Stand pivot transfers: Min guard       General transfer comment: min guard assist and min cues for safety with RW and sequencing/NWB precs; pt with good carryover of cues; EOB<>WC  Ambulation/Gait                 Theme park manager mobility: Yes Wheelchair propulsion: Both upper extremities Wheelchair parts: Needs assistance Distance: 220 Wheelchair Assistance Details (indicate cue type and reason): pt given instruction on wc safety, use of brakes and leg rest placement/removal and adjusted leg rests for proper fit. Pt propelled w/c in hallway and demonstrated good technique for forward/retro steering and turns 180 deg.   Modified Rankin (Stroke Patients Only)       Balance Overall balance assessment: Needs assistance Sitting-balance support: Feet supported Sitting balance-Leahy Scale: Good     Standing balance support: Bilateral upper extremity supported Standing balance-Leahy Scale: Poor Standing balance comment: needs BUE support of RW due to WB restrictions                            Cognition Arousal/Alertness: Awake/alert Behavior During Therapy: WFL for tasks assessed/performed Overall Cognitive Status: Within Functional Limits for tasks assessed                                        Exercises      General Comments        Pertinent Vitals/Pain Pain Assessment: 0-10 Pain Score: 0-No pain Pain Intervention(s): Monitored during session;Premedicated before session (  pt had tylenol prior)  HR 90's bpm seated EOB, SpO2 95% on RA, no dizziness reported with transfers/wc mobility  Home Living                      Prior Function            PT Goals (current goals can now be found in the care plan section) Acute Rehab PT Goals Patient Stated Goal: did not state; agreeable to therapy PT Goal Formulation:  With patient Time For Goal Achievement: 11/24/20 Potential to Achieve Goals: Good Progress towards PT goals: Progressing toward goals    Frequency    Min 5X/week      PT Plan Current plan remains appropriate    Co-evaluation              AM-PAC PT "6 Clicks" Mobility   Outcome Measure  Help needed turning from your back to your side while in a flat bed without using bedrails?: None Help needed moving from lying on your back to sitting on the side of a flat bed without using bedrails?: None Help needed moving to and from a bed to a chair (including a wheelchair)?: A Little Help needed standing up from a chair using your arms (e.g., wheelchair or bedside chair)?: A Little Help needed to walk in hospital room?: Total Help needed climbing 3-5 steps with a railing? : Total 6 Click Score: 16    End of Session Equipment Utilized During Treatment: Gait belt Activity Tolerance: Patient tolerated treatment well Patient left: in bed;with call bell/phone within reach;with nursing/sitter in room (RN entering room to replace IV meds) Nurse Communication: Mobility status PT Visit Diagnosis: Pain;Other abnormalities of gait and mobility (R26.89)     Time: 7530-0511 PT Time Calculation (min) (ACUTE ONLY): 41 min  Charges:  $Therapeutic Activity: 23-37 mins $Wheel Chair Management: 8-22 mins                     Jerrie Gullo P., PTA Acute Rehabilitation Services Pager: [phone] Office: [place] 11/11/2020, 12:36 PM

## 2020-11-11 NOTE — TOC CAGE-AID Note (Signed)
Transition of Care [facility]) - CAGE-AID Screening   Patient Details  Name: [name] MRN: [id number] Date of Birth: [birth year]  Transition of Care [facility]) CM/SW Contact:    Emeterio Reeve, [place] Phone Number: 11/11/2020, 2:31 PM   Clinical Narrative:  CSW met with pt at bedside. CSW introduced self and explained role at the hospital.  Pt denies alcohol use. Pt denies substance use. Pt did not need any resources at this time.    CAGE-AID Screening:    Have You Ever Felt You Ought to Cut Down on Your Drinking or Drug Use?: No Have People Annoyed You By Critizing Your Drinking Or Drug Use?: No Have You Felt Bad Or Guilty About Your Drinking Or Drug Use?: No Have You Ever Had a Drink or Used Drugs First Thing In The Morning to Steady Your Nerves or to Get Rid of a Hangover?: No CAGE-AID Score: 0  Substance Abuse Education Offered: Yes     Blima Ledger, [place] Social Worker [phone]

## 2020-11-12 ENCOUNTER — Telehealth: Payer: Self-pay | Admitting: Family Medicine

## 2020-11-12 DIAGNOSIS — E1140 Type 2 diabetes mellitus with diabetic neuropathy, unspecified: Secondary | ICD-10-CM | POA: Diagnosis not present

## 2020-11-12 DIAGNOSIS — I4891 Unspecified atrial fibrillation: Secondary | ICD-10-CM | POA: Diagnosis not present

## 2020-11-12 DIAGNOSIS — S82852S Displaced trimalleolar fracture of left lower leg, sequela: Secondary | ICD-10-CM | POA: Diagnosis not present

## 2020-11-12 NOTE — Telephone Encounter (Signed)
I have called and scheduled patient to be seen tomorrow 11/13/20

## 2020-11-12 NOTE — Telephone Encounter (Signed)
This patient was recently discharged from the hospital.  She was mistakenly assigned to the internal medicine teaching service.  There is no hospital followup scheduled.  Can we call her to schedule a hospital follow up with me?

## 2020-11-13 ENCOUNTER — Encounter: Payer: Self-pay | Admitting: Family Medicine

## 2020-11-13 ENCOUNTER — Ambulatory Visit (INDEPENDENT_AMBULATORY_CARE_PROVIDER_SITE_OTHER): Payer: Medicare Other | Admitting: Family Medicine

## 2020-11-13 ENCOUNTER — Other Ambulatory Visit: Payer: Self-pay

## 2020-11-13 VITALS — BP 110/62 | HR 85 | Ht 63.0 in

## 2020-11-13 DIAGNOSIS — S82852D Displaced trimalleolar fracture of left lower leg, subsequent encounter for closed fracture with routine healing: Secondary | ICD-10-CM

## 2020-11-13 DIAGNOSIS — N179 Acute kidney failure, unspecified: Secondary | ICD-10-CM

## 2020-11-13 MED ORDER — SENNA 8.6 MG PO TABS: 1.0000 | ORAL_TABLET | Freq: Every day | ORAL | 0 refills | Status: DC | PRN

## 2020-11-13 NOTE — Progress Notes (Signed)
    SUBJECTIVE:   CHIEF COMPLAINT / HPI:   Patient doing well since her discharge from the hospital.  She ha a f/u scheduled with the orhopedist.  She has the supplies she needs.  Still has pain in the leg but the medication makes it manageable.  Has some constipation and miralax was giving her gas.  dulcolox works for her.     PERTINENT  PMH / PSH: trimalleolar fracture  OBJECTIVE:   BP 110/62   Pulse 85   Ht 5\' 3"  (1.6 m)   LMP  (LMP Unknown)   SpO2 97%   BMI 33.48 kg/m   Gen: alert, oriented.  In wheelchair.  Accompanied by husband.  CV: RRR.  Pulm: LCTAB MSK: left foot in boot.    ASSESSMENT/PLAN:   Closed trimalleolar fracture of left ankle Doing well since discharge.  Pain manageable with medication.  Has supplies she needs and will f/u with orhthopedist as scheduled.   AKI (acute kidney injury) ([place]) AKI seen during hospitalization.  Will get f/u BMP today to check for improvement.       Benay Pike, MD [place]

## 2020-11-13 NOTE — Patient Instructions (Signed)
It was nice to see you today,  I would like you to hold the lisinopril until one of the following occurs: -You have multiple readings with the systolic or top blood pressure number being greater than 150, or -You have multiple readings with the bottom number (diastolic) being higher than 70.  I would like to see you back at some point in January for regular routine visits.   have a great day,  [name], MD

## 2020-11-14 LAB — BASIC METABOLIC PANEL
BUN/Creatinine Ratio: 19 (ref 12–28)
BUN: 20 mg/dL (ref 8–27)
CO2: 23 mmol/L (ref 20–29)
Calcium: 9.1 mg/dL (ref 8.7–10.3)
Chloride: 102 mmol/L (ref 96–106)
Creatinine, Ser: 1.05 mg/dL — ABNORMAL HIGH (ref 0.57–1.00)
GFR calc Af Amer: 62 mL/min/{1.73_m2} (ref >59)
GFR calc non Af Amer: 54 mL/min/{1.73_m2} — ABNORMAL LOW (ref >59)
Glucose: 111 mg/dL — ABNORMAL HIGH (ref 65–99)
Potassium: 4.2 mmol/L (ref 3.5–5.2)
Sodium: 140 mmol/L (ref 134–144)

## 2020-11-16 DIAGNOSIS — N179 Acute kidney failure, unspecified: Secondary | ICD-10-CM | POA: Insufficient documentation

## 2020-11-16 NOTE — Assessment & Plan Note (Signed)
AKI seen during hospitalization.  Will get f/u BMP today to check for improvement.

## 2020-11-16 NOTE — Assessment & Plan Note (Signed)
Doing well since discharge.  Pain manageable with medication.  Has supplies she needs and will f/u with orhthopedist as scheduled.

## 2020-11-17 ENCOUNTER — Other Ambulatory Visit: Payer: Self-pay | Admitting: Cardiology

## 2020-11-17 ENCOUNTER — Other Ambulatory Visit: Payer: Self-pay | Admitting: Family Medicine

## 2020-11-18 DIAGNOSIS — E1140 Type 2 diabetes mellitus with diabetic neuropathy, unspecified: Secondary | ICD-10-CM | POA: Diagnosis not present

## 2020-11-18 DIAGNOSIS — I4891 Unspecified atrial fibrillation: Secondary | ICD-10-CM | POA: Diagnosis not present

## 2020-11-25 DIAGNOSIS — E1140 Type 2 diabetes mellitus with diabetic neuropathy, unspecified: Secondary | ICD-10-CM | POA: Diagnosis not present

## 2020-11-25 DIAGNOSIS — S82852S Displaced trimalleolar fracture of left lower leg, sequela: Secondary | ICD-10-CM | POA: Diagnosis not present

## 2020-11-25 DIAGNOSIS — I4891 Unspecified atrial fibrillation: Secondary | ICD-10-CM | POA: Diagnosis not present

## 2020-11-25 DIAGNOSIS — S82852D Displaced trimalleolar fracture of left lower leg, subsequent encounter for closed fracture with routine healing: Secondary | ICD-10-CM | POA: Diagnosis not present

## 2020-12-02 DIAGNOSIS — S82852S Displaced trimalleolar fracture of left lower leg, sequela: Secondary | ICD-10-CM | POA: Diagnosis not present

## 2020-12-02 DIAGNOSIS — I4891 Unspecified atrial fibrillation: Secondary | ICD-10-CM | POA: Diagnosis not present

## 2020-12-02 DIAGNOSIS — E1140 Type 2 diabetes mellitus with diabetic neuropathy, unspecified: Secondary | ICD-10-CM | POA: Diagnosis not present

## 2020-12-08 DIAGNOSIS — G4733 Obstructive sleep apnea (adult) (pediatric): Secondary | ICD-10-CM | POA: Diagnosis not present

## 2020-12-09 DIAGNOSIS — I4891 Unspecified atrial fibrillation: Secondary | ICD-10-CM | POA: Diagnosis not present

## 2020-12-09 DIAGNOSIS — E1140 Type 2 diabetes mellitus with diabetic neuropathy, unspecified: Secondary | ICD-10-CM | POA: Diagnosis not present

## 2020-12-09 DIAGNOSIS — S82852S Displaced trimalleolar fracture of left lower leg, sequela: Secondary | ICD-10-CM | POA: Diagnosis not present

## 2020-12-15 DIAGNOSIS — E1140 Type 2 diabetes mellitus with diabetic neuropathy, unspecified: Secondary | ICD-10-CM | POA: Diagnosis not present

## 2020-12-15 DIAGNOSIS — I4891 Unspecified atrial fibrillation: Secondary | ICD-10-CM | POA: Diagnosis not present

## 2020-12-19 ENCOUNTER — Other Ambulatory Visit: Payer: Self-pay | Admitting: Cardiology

## 2020-12-23 DIAGNOSIS — S82852S Displaced trimalleolar fracture of left lower leg, sequela: Secondary | ICD-10-CM | POA: Diagnosis not present

## 2020-12-23 DIAGNOSIS — E1140 Type 2 diabetes mellitus with diabetic neuropathy, unspecified: Secondary | ICD-10-CM | POA: Diagnosis not present

## 2020-12-23 DIAGNOSIS — S82852D Displaced trimalleolar fracture of left lower leg, subsequent encounter for closed fracture with routine healing: Secondary | ICD-10-CM | POA: Diagnosis not present

## 2020-12-23 DIAGNOSIS — I4891 Unspecified atrial fibrillation: Secondary | ICD-10-CM | POA: Diagnosis not present

## 2020-12-26 ENCOUNTER — Other Ambulatory Visit: Payer: Self-pay | Admitting: Cardiology

## 2020-12-27 DIAGNOSIS — S82852A Displaced trimalleolar fracture of left lower leg, initial encounter for closed fracture: Secondary | ICD-10-CM | POA: Diagnosis not present

## 2020-12-27 DIAGNOSIS — M1711 Unilateral primary osteoarthritis, right knee: Secondary | ICD-10-CM | POA: Diagnosis not present

## 2020-12-27 DIAGNOSIS — G629 Polyneuropathy, unspecified: Secondary | ICD-10-CM | POA: Diagnosis not present

## 2020-12-27 DIAGNOSIS — R531 Weakness: Secondary | ICD-10-CM | POA: Diagnosis not present

## 2020-12-27 DIAGNOSIS — G4733 Obstructive sleep apnea (adult) (pediatric): Secondary | ICD-10-CM | POA: Diagnosis not present

## 2020-12-29 ENCOUNTER — Other Ambulatory Visit: Payer: Self-pay | Admitting: Cardiology

## 2020-12-30 DIAGNOSIS — S82852S Displaced trimalleolar fracture of left lower leg, sequela: Secondary | ICD-10-CM | POA: Diagnosis not present

## 2020-12-30 DIAGNOSIS — E1140 Type 2 diabetes mellitus with diabetic neuropathy, unspecified: Secondary | ICD-10-CM | POA: Diagnosis not present

## 2020-12-30 DIAGNOSIS — I4891 Unspecified atrial fibrillation: Secondary | ICD-10-CM | POA: Diagnosis not present

## 2021-01-09 ENCOUNTER — Ambulatory Visit: Payer: Medicare Other | Admitting: Family Medicine

## 2021-01-14 DIAGNOSIS — E1140 Type 2 diabetes mellitus with diabetic neuropathy, unspecified: Secondary | ICD-10-CM | POA: Diagnosis not present

## 2021-01-14 DIAGNOSIS — S82852S Displaced trimalleolar fracture of left lower leg, sequela: Secondary | ICD-10-CM | POA: Diagnosis not present

## 2021-01-14 DIAGNOSIS — I4891 Unspecified atrial fibrillation: Secondary | ICD-10-CM | POA: Diagnosis not present

## 2021-01-19 DIAGNOSIS — S82852S Displaced trimalleolar fracture of left lower leg, sequela: Secondary | ICD-10-CM | POA: Diagnosis not present

## 2021-01-19 DIAGNOSIS — E1140 Type 2 diabetes mellitus with diabetic neuropathy, unspecified: Secondary | ICD-10-CM | POA: Diagnosis not present

## 2021-01-19 DIAGNOSIS — I4891 Unspecified atrial fibrillation: Secondary | ICD-10-CM | POA: Diagnosis not present

## 2021-01-26 DIAGNOSIS — S82852S Displaced trimalleolar fracture of left lower leg, sequela: Secondary | ICD-10-CM | POA: Diagnosis not present

## 2021-01-26 DIAGNOSIS — E1140 Type 2 diabetes mellitus with diabetic neuropathy, unspecified: Secondary | ICD-10-CM | POA: Diagnosis not present

## 2021-01-26 DIAGNOSIS — I4891 Unspecified atrial fibrillation: Secondary | ICD-10-CM | POA: Diagnosis not present

## 2021-01-30 ENCOUNTER — Other Ambulatory Visit: Payer: Self-pay | Admitting: Family Medicine

## 2021-01-30 ENCOUNTER — Other Ambulatory Visit: Payer: Self-pay | Admitting: Cardiology

## 2021-02-01 NOTE — Progress Notes (Signed)
    SUBJECTIVE:   CHIEF COMPLAINT / HPI:   Dm: Patient has been taking her Metformin 500 twice a day.  No side effects with this medication.  Hld: Patient is taking her rosuvastatin daily.  No concerns.  Neuropathy: Patient is no longer having paresthesias in the hand.  She is no longer taking gabapentin.  Ankle: Patient states her ankle feels much better.  She goes to see the orthopedist tomorrow for follow-up.  He is currently using a walker but thinks she may be able to get switch to a cane tomorrow.  Not painful but still tender to touch.  Back pain: Patient has low back pain affecting the middle and left side of her back.  She says it is her arthritis flaring up.  She currently takes Tylenol 500 mg up to 6 times a day.  She does not take NSAIDs or aspirin.  She says Voltaren gel last for maybe "15 minutes".  She has not put on any topical gels but does use heat.  PERTINENT  PMH / PSH: DM  OBJECTIVE:   BP 102/60   Pulse 96   Ht 5\' 3"  (1.6 m)   Wt 185 lb 6.4 oz (84.1 kg)   LMP  (LMP Unknown)   SpO2 93%   BMI 32.84 kg/m   General: Alert and oriented.  No acute distress.  Accompanied by husband.  Using a walker. CV: Regular rate and rhythm Pulmonary: Lungs clear auscultation bilaterally. MSK: Gait is slowed and deliberate.  Needs assistant stepping up to the examining table.  Tender to palpation at the left ankle joint, left greater trochanter, sacrum and left SI joint.  No pain with internal/external rotation of the hip.  Negative straight leg test.  ASSESSMENT/PLAN:   Chronic midline low back pain without sciatica Chronic.  Advised pt to discuss this with her orthopedist whom she sees this week.  Prescribed lidocaine patch.   Diabetes ([place]) Well controlled.  a1c 6.2 today.  Continue metformin.   HLD (hyperlipidemia) Continue rosuvastatin.  ldl below goal on last check.    Peripheral neuropathy Resolved. No longer on gabapentin.  Continue to monitor.      Benay Pike, MD [place]

## 2021-02-02 ENCOUNTER — Other Ambulatory Visit: Payer: Self-pay

## 2021-02-02 ENCOUNTER — Ambulatory Visit (INDEPENDENT_AMBULATORY_CARE_PROVIDER_SITE_OTHER): Payer: Medicare Other | Admitting: Family Medicine

## 2021-02-02 ENCOUNTER — Encounter: Payer: Self-pay | Admitting: Family Medicine

## 2021-02-02 VITALS — BP 102/60 | HR 96 | Ht 63.0 in | Wt 185.4 lb

## 2021-02-02 DIAGNOSIS — E785 Hyperlipidemia, unspecified: Secondary | ICD-10-CM | POA: Diagnosis not present

## 2021-02-02 DIAGNOSIS — M545 Low back pain, unspecified: Secondary | ICD-10-CM | POA: Diagnosis not present

## 2021-02-02 DIAGNOSIS — E119 Type 2 diabetes mellitus without complications: Secondary | ICD-10-CM | POA: Diagnosis not present

## 2021-02-02 DIAGNOSIS — G8929 Other chronic pain: Secondary | ICD-10-CM | POA: Diagnosis not present

## 2021-02-02 DIAGNOSIS — G589 Mononeuropathy, unspecified: Secondary | ICD-10-CM

## 2021-02-02 DIAGNOSIS — S82852S Displaced trimalleolar fracture of left lower leg, sequela: Secondary | ICD-10-CM | POA: Diagnosis not present

## 2021-02-02 DIAGNOSIS — E1140 Type 2 diabetes mellitus with diabetic neuropathy, unspecified: Secondary | ICD-10-CM | POA: Diagnosis not present

## 2021-02-02 DIAGNOSIS — I4891 Unspecified atrial fibrillation: Secondary | ICD-10-CM | POA: Diagnosis not present

## 2021-02-02 LAB — POCT GLYCOSYLATED HEMOGLOBIN (HGB A1C): Hemoglobin A1C: 6.2 % — AB (ref 4.0–5.6)

## 2021-02-02 MED ORDER — LIDOCAINE 4 % EX PTCH: MEDICATED_PATCH | CUTANEOUS | 1 refills | Status: DC

## 2021-02-02 NOTE — Progress Notes (Deleted)
Arthritis in back is flaring up.  Taking tylenol 6x 500mg .  Using heating pad.  voltaren gel doesn't last long. Lower back, shoulder blade.    Not taking gabbapentin.

## 2021-02-02 NOTE — Patient Instructions (Addendum)
It was nice to see you again today,  We do not have any of those lidocaine samples here, but I sent in a prescription you can also pick them up over-the-counter.  Just apply 1 patch per day at most to the area that hurts.  I would also recommend talking to your orthopedist tomorrow about your back pain for other possible treatments that are not available here such as an injection into the joint.  Continue taking your diabetes medications, cholesterol and blood pressure medications.  I will be finished here in June so if you would like to see me once more, you should make an appointment before June.  Otherwise follow-up as needed.  Have a great day,  [name], MD

## 2021-02-03 DIAGNOSIS — M545 Low back pain, unspecified: Secondary | ICD-10-CM | POA: Insufficient documentation

## 2021-02-03 DIAGNOSIS — S82852D Displaced trimalleolar fracture of left lower leg, subsequent encounter for closed fracture with routine healing: Secondary | ICD-10-CM | POA: Diagnosis not present

## 2021-02-03 DIAGNOSIS — G8929 Other chronic pain: Secondary | ICD-10-CM | POA: Insufficient documentation

## 2021-02-03 NOTE — Assessment & Plan Note (Signed)
Continue rosuvastatin.  ldl below goal on last check.

## 2021-02-03 NOTE — Assessment & Plan Note (Signed)
Well controlled.  a1c 6.2 today.  Continue metformin.

## 2021-02-03 NOTE — Assessment & Plan Note (Signed)
Resolved. No longer on gabapentin.  Continue to monitor.

## 2021-02-03 NOTE — Assessment & Plan Note (Signed)
Chronic.  Advised pt to discuss this with her orthopedist whom she sees this week.  Prescribed lidocaine patch.

## 2021-02-05 DIAGNOSIS — Z8551 Personal history of malignant neoplasm of bladder: Secondary | ICD-10-CM | POA: Diagnosis not present

## 2021-02-11 DIAGNOSIS — G4733 Obstructive sleep apnea (adult) (pediatric): Secondary | ICD-10-CM | POA: Diagnosis not present

## 2021-02-11 DIAGNOSIS — G629 Polyneuropathy, unspecified: Secondary | ICD-10-CM | POA: Diagnosis not present

## 2021-02-11 DIAGNOSIS — M1711 Unilateral primary osteoarthritis, right knee: Secondary | ICD-10-CM | POA: Diagnosis not present

## 2021-02-11 DIAGNOSIS — R531 Weakness: Secondary | ICD-10-CM | POA: Diagnosis not present

## 2021-02-11 DIAGNOSIS — S82852A Displaced trimalleolar fracture of left lower leg, initial encounter for closed fracture: Secondary | ICD-10-CM | POA: Diagnosis not present

## 2021-02-22 NOTE — Progress Notes (Addendum)
Cardiology Office Note   Date:  02/24/2021   ID:  Lorain, Fettes 09/15/1951, MRN [id number]  PCP:  Benay Pike, MD  Cardiologist:   Minus Breeding, MD   Chief Complaint  Patient presents with  . Atrial Fibrillation      History of Present Illness: [name] is a 70 y.o. female who presents for evaluation of atrial fibrillation.  She was see in the hospital in July 2019 after syncope but there was not a clear etiology and other than probable dehydration.  She did have a complete work up with an unremarkable echo, negative perfusion study and an event monitor without pauses and with chronic atrial fib.   Since I last saw her she fell and broke her ankle and had hip surgery.  She was in the hospital just.  She had no new cardiac complaints other than the fact that her heart rate goes a little bit faster because she is now a little deconditioned and walking with a cane.  She has some back pain.  She has some pain with the ankle.  She says that when she sits and relaxes it comes down very quickly to the 80s and after she spends most of her time and that heart rate range.  She watches up with a pulse ox.  She is raising her 77-year-old great grandson Darlyn Chamber.   Past Medical History:  Diagnosis Date  . Arthritis    wrist, hands -  . Atrial fibrillation, persistent ([place])   . Bladder tumor   . CAD (coronary artery disease) cardiologist-  dr hochrien   PTCA RCA 1999  . Cataract   . Diabetes ([place])   . Full dentures   . GERD (gastroesophageal reflux disease)   . History of colon polyps    2005  hyperplastic  . History of MI (myocardial infarction)    1999  . HLD (hyperlipidemia)   . Hypertension   . Hypothyroidism   . SUI (stress urinary incontinence, female)     Past Surgical History:  Procedure Laterality Date  . CARDIOVASCULAR STRESS TEST  05-01-2009   dr Kamdyn Colborn   normal nuclear study/  no ischemia or scar/  normal LV function and wall motion, ef 71%  .  COLONOSCOPY     multiple  . CORONARY ANGIOPLASTY  Feb 1999   PTCA to RCA  . D & C HYSTEROSCOPY/  POLYPECTOMY  05-08-2009  . HYSTEROSCOPY WITH D & C  12/07/2012   Procedure: DILATATION AND CURETTAGE /HYSTEROSCOPY;  Surgeon: Elveria Royals, MD;  Location: [facility];  Service: Gynecology;  Laterality: N/A;  Dilitation and curettage /hysteroscopy/biopsey of lower segment of uterus  . ORIF ANKLE FRACTURE Left 11/10/2020   Procedure: OPEN REDUCTION INTERNAL FIXATION (ORIF) ANKLE FRACTURE;  Surgeon: Shona Needles, MD;  Location: [place] [facility];  Service: Orthopedics;  Laterality: Left;  . TOTAL HIP ARTHROPLASTY Left 12-11-2010  . TRANSTHORACIC ECHOCARDIOGRAM  09-11-2015   mild LVH, ef 55%,  mildly reduced RVSF,  trivial TR  . TRANSURETHRAL RESECTION OF BLADDER TUMOR N/A 12/01/2015   Procedure: TRANSURETHRAL RESECTION OF BLADDER TUMOR (TURBT);  Surgeon: Kathie Rhodes, MD;  Location: [facility];  Service: Urology;  Laterality: N/A;  . TUBAL LIGATION  1974     Current Outpatient Medications  Medication Sig Dispense Refill  . acetaminophen (TYLENOL) 500 MG tablet Take 500 mg by mouth every 6 (six) hours as needed for headache (pain).    . CALCIUM CARBONATE ANTACID  PO Take 1 tablet by mouth at bedtime as needed for indigestion or heartburn.     . diltiazem (CARDIZEM CD) 240 MG 24 hr capsule TAKE 1 CAPSULE BY MOUTH EVERY DAY 90 capsule 0  . ELIQUIS 5 MG TABS tablet TAKE 1 TABLET(5 MG) BY MOUTH TWICE DAILY 180 tablet 1  . levothyroxine (SYNTHROID) 100 MCG tablet TAKE 1 TABLET(100 MCG) BY MOUTH DAILY 90 tablet 3  . Lidocaine (HM LIDOCAINE PATCH) 4 % PTCH Apply once per day as needed to lower back. 10 patch 1  . lisinopril (ZESTRIL) 2.5 MG tablet TAKE 1 TABLET(2.5 MG) BY MOUTH DAILY 90 tablet 3  . loratadine (CLARITIN) 10 MG tablet Take 10 mg by mouth daily.    . metFORMIN (GLUCOPHAGE-XR) 500 MG 24 hr tablet TAKE 1 TABLET(500 MG) BY MOUTH TWICE DAILY 180 tablet 3  . methocarbamol (ROBAXIN) 500 MG  tablet Take 1 tablet (500 mg total) by mouth 3 (three) times daily. 30 tablet 0  . ONETOUCH DELICA LANCETS FINE MISC TEST TWICE DAILY( 8AM AND 10PM) (Patient taking differently: 2 (two) times daily.) 100 each 0  . Polyethyl Glycol-Propyl Glycol (SYSTANE) 0.4-0.3 % SOLN Place 1 drop into both eyes daily as needed (dry eyes).    . predniSONE (DELTASONE) 20 MG tablet Take 2 tablets daily with breakfast. 10 tablet 0  . rosuvastatin (CRESTOR) 40 MG tablet TAKE 1 TABLET(40 MG) BY MOUTH DAILY 90 tablet 2  . senna (SENOKOT) 8.6 MG TABS tablet Take 1 tablet (8.6 mg total) by mouth daily as needed for mild constipation. 30 tablet 0   No current facility-administered medications for this visit.    Allergies:   Penicillins and Percocet [oxycodone-acetaminophen]    ROS:  Please see the history of present illness.   Otherwise, review of systems are positive for none.   All other systems are reviewed and negative.    PHYSICAL EXAM: VS:  BP 124/64   Pulse 96   Ht 5\' 3"  (1.6 m)   Wt 186 lb 12.8 oz (84.7 kg)   LMP  (LMP Unknown)   SpO2 96%   BMI 33.09 kg/m  , BMI Body mass index is 33.09 kg/m. GENERAL:  Well appearing NECK:  No jugular venous distention, waveform within normal limits, carotid upstroke brisk and symmetric, no bruits, no thyromegaly LUNGS:  Clear to auscultation bilaterally CHEST:  Unremarkable HEART:  PMI not displaced or sustained,S1 and S2 within normal limits, no S3, no clicks, no rubs, no murmurs, irregular ABD:  Flat, positive bowel sounds normal in frequency in pitch, no bruits, no rebound, no guarding, no midline pulsatile mass, no hepatomegaly, no splenomegaly EXT:  2 plus pulses throughout, no edema, no cyanosis no clubbing   EKG:  EKG is  ordered today. The ekg ordered today demonstrates atrial fibrillation, rate 96, axis within normal limits, intervals within normal limits, no acute ST-T wave changes.   Recent Labs: 06/13/2020: TSH 1.430 11/11/2020: Hemoglobin 10.5;  Platelets 245 11/13/2020: BUN 20; Creatinine, Ser 1.05; Potassium 4.2; Sodium 140    Lipid Panel    Component Value Date/Time   CHOL 147 12/04/2019 1726   TRIG 119 12/04/2019 1726   HDL 64 12/04/2019 1726   CHOLHDL 2.3 12/04/2019 1726   CHOLHDL 2.6 04/25/2017 0843   VLDL 28 04/25/2017 0843   LDLCALC 62 12/04/2019 1726   LDLDIRECT 125 (H) 06/01/2010 2029      Wt Readings from Last 3 Encounters:  02/24/21 186 lb 12.8 oz (84.7 kg)  02/02/21 185  lb 6.4 oz (84.1 kg)  11/09/20 189 lb (85.7 kg)      Other studies Reviewed: Additional studies/ records that were reviewed today include:  Review of the above records demonstrates:  Please see elsewhere in the note.     ASSESSMENT AND PLAN:  ATRIAL FIBRILLATION -  [name] has a CHA2DS2 - VASc score of 5.  She tolerates anticoagulation.  She has reasonable rate control.  No change in therapy.  ESSENTIAL HYPERTENSION, BENIGN -  The blood pressure is  at target.  No change in therapy.   CAD - She had a negative perfusion study in 2019.  She has had no new symptoms.  No change in therapy.   DYSLIPIDEMIA - She has not had a recent lipid profile and she is going to come back for fasting lipid profile liver enzymes.  DM - A1c was 6.2.  No change in therapy.    Current medicines are reviewed at length with the patient today.  The patient does not have concerns regarding medicines.  The following changes have been made:  None  Labs/ tests ordered today include:  None  Orders Placed This Encounter  Procedures  . EKG 12-Lead     Disposition:   FU with me in 12 months   Signed, Minus Breeding, MD  02/24/2021 2:05 PM    [facility]

## 2021-02-23 ENCOUNTER — Ambulatory Visit (INDEPENDENT_AMBULATORY_CARE_PROVIDER_SITE_OTHER): Payer: Medicare Other

## 2021-02-23 ENCOUNTER — Other Ambulatory Visit: Payer: Self-pay

## 2021-02-23 ENCOUNTER — Ambulatory Visit (HOSPITAL_COMMUNITY)
Admission: EM | Admit: 2021-02-23 | Discharge: 2021-02-23 | Disposition: A | Discharge status: Home / Self Care | Payer: Medicare Other | Attending: Urgent Care | Admitting: Urgent Care

## 2021-02-23 ENCOUNTER — Encounter (HOSPITAL_COMMUNITY): Payer: Self-pay

## 2021-02-23 DIAGNOSIS — M545 Low back pain, unspecified: Secondary | ICD-10-CM

## 2021-02-23 DIAGNOSIS — M8580 Other specified disorders of bone density and structure, unspecified site: Secondary | ICD-10-CM

## 2021-02-23 DIAGNOSIS — M5136 Other intervertebral disc degeneration, lumbar region: Secondary | ICD-10-CM

## 2021-02-23 MED ORDER — PREDNISONE 20 MG PO TABS: ORAL_TABLET | ORAL | 0 refills | Status: DC

## 2021-02-23 MED ORDER — METHOCARBAMOL 500 MG PO TABS: 500.0000 mg | ORAL_TABLET | Freq: Three times a day (TID) | ORAL | 0 refills | Status: DC

## 2021-02-23 NOTE — ED Provider Notes (Signed)
[place]   MRN: [id number] DOB: February 09, 1951  Subjective:   [name] is a 70 y.o. female presenting for 3-week history of persistent moderate to severe low back pain.  Denies any falls, trauma, numbness or tingling, weakness.  Patient does have a history of degenerative disc disease of the lumbar region.  Has chronic low back pain.  She has not followed by a back specialist.  Has CKD 3.  Has type 2 diabetes, last A1c was 6.2% on 02/02/2021.  No current facility-administered medications for this encounter.  Current Outpatient Medications:  .  acetaminophen (TYLENOL) 500 MG tablet, Take 500 mg by mouth every 6 (six) hours as needed for headache (pain)., Disp: , Rfl:  .  CALCIUM CARBONATE ANTACID PO, Take 1 tablet by mouth at bedtime as needed for indigestion or heartburn. , Disp: , Rfl:  .  diltiazem (CARDIZEM CD) 240 MG 24 hr capsule, TAKE 1 CAPSULE BY MOUTH EVERY DAY, Disp: 90 capsule, Rfl: 0 .  ELIQUIS 5 MG TABS tablet, TAKE 1 TABLET(5 MG) BY MOUTH TWICE DAILY, Disp: 180 tablet, Rfl: 1 .  gabapentin (NEURONTIN) 100 MG capsule, 100mg  in AM and afternoon, and 300mg  at night. (Patient not taking: Reported on 11/09/2020), Disp: 90 capsule, Rfl: 3 .  HYDROcodone-acetaminophen (NORCO/VICODIN) 5-325 MG tablet, Take 1-2 tablets by mouth every 6 (six) hours as needed for severe pain., Disp: 40 tablet, Rfl: 0 .  levothyroxine (SYNTHROID) 100 MCG tablet, TAKE 1 TABLET(100 MCG) BY MOUTH DAILY, Disp: 90 tablet, Rfl: 3 .  Lidocaine (HM LIDOCAINE PATCH) 4 % PTCH, Apply once per day as needed to lower back., Disp: 10 patch, Rfl: 1 .  lisinopril (ZESTRIL) 2.5 MG tablet, TAKE 1 TABLET(2.5 MG) BY MOUTH DAILY, Disp: 90 tablet, Rfl: 3 .  loratadine (CLARITIN) 10 MG tablet, Take 10 mg by mouth daily. , Disp: , Rfl:  .  metFORMIN (GLUCOPHAGE-XR) 500 MG 24 hr tablet, TAKE 1 TABLET(500 MG) BY MOUTH TWICE DAILY, Disp: 180 tablet, Rfl: 3 .  ONETOUCH DELICA LANCETS FINE MISC, TEST TWICE  DAILY( 8AM AND 10PM) (Patient taking differently: 2 (two) times daily. ), Disp: 100 each, Rfl: 0 .  Polyethyl Glycol-Propyl Glycol (SYSTANE) 0.4-0.3 % SOLN, Place 1 drop into both eyes daily as needed (dry eyes)., Disp: , Rfl:  .  rosuvastatin (CRESTOR) 40 MG tablet, TAKE 1 TABLET(40 MG) BY MOUTH DAILY, Disp: 90 tablet, Rfl: 2 .  senna (SENOKOT) 8.6 MG TABS tablet, Take 1 tablet (8.6 mg total) by mouth daily as needed for mild constipation., Disp: 30 tablet, Rfl: 0   Allergies  Allergen Reactions  . Penicillins Itching    Has patient had a PCN reaction causing immediate rash, facial/tongue/throat swelling, SOB or lightheadedness with hypotension: Yes Has patient had a PCN reaction causing severe rash involving mucus membranes or skin necrosis: No Has patient had a PCN reaction that required hospitalization: No Has patient had a PCN reaction occurring within the last 10 years: Yes If all of the above answers are "NO", then may proceed with Cephalosporin use.   Marland Kitchen Percocet [Oxycodone-Acetaminophen] Itching    Past Medical History:  Diagnosis Date  . Anticoagulated on Coumadin   . Arthritis    wrist, hands -  . Atrial fibrillation, persistent ([place])   . Bladder tumor   . CAD (coronary artery disease) cardiologist-  dr hochrien   PTCA RCA 1999  . Cataract   . Diabetes ([place])   . Full dentures   .  GERD (gastroesophageal reflux disease)   . History of colon polyps    2005  hyperplastic  . History of MI (myocardial infarction)    1999  . HLD (hyperlipidemia)   . Hypertension   . Hypothyroidism   . SUI (stress urinary incontinence, female)   . Wears glasses      Past Surgical History:  Procedure Laterality Date  . CARDIOVASCULAR STRESS TEST  05-01-2009   dr hochrein   normal nuclear study/  no ischemia or scar/  normal LV function and wall motion, ef 71%  . COLONOSCOPY     multiple  . CORONARY ANGIOPLASTY  Feb 1999   PTCA to RCA  . D & C HYSTEROSCOPY/  POLYPECTOMY  05-08-2009   . HYSTEROSCOPY WITH D & C  12/07/2012   Procedure: DILATATION AND CURETTAGE /HYSTEROSCOPY;  Surgeon: Elveria Royals, MD;  Location: [facility];  Service: Gynecology;  Laterality: N/A;  Dilitation and curettage /hysteroscopy/biopsey of lower segment of uterus  . ORIF ANKLE FRACTURE Left 11/10/2020   Procedure: OPEN REDUCTION INTERNAL FIXATION (ORIF) ANKLE FRACTURE;  Surgeon: Shona Needles, MD;  Location: [facility];  Service: Orthopedics;  Laterality: Left;  . TOTAL HIP ARTHROPLASTY Left 12-11-2010  . TRANSTHORACIC ECHOCARDIOGRAM  09-11-2015   mild LVH, ef 55%,  mildly reduced RVSF,  trivial TR  . TRANSURETHRAL RESECTION OF BLADDER TUMOR N/A 12/01/2015   Procedure: TRANSURETHRAL RESECTION OF BLADDER TUMOR (TURBT);  Surgeon: Kathie Rhodes, MD;  Location: [facility];  Service: Urology;  Laterality: N/A;  . TUBAL LIGATION  1974    Family History  Problem Relation Age of Onset  . Diabetes Mother   . Heart disease Mother   . Colon cancer Mother 27  . Heart disease Father   . Cancer Sister        Lung  . Cancer Brother        intestinal  . Atrial fibrillation Brother   . Cancer Brother        throat ca  . Cancer Brother        bladder  . Breast cancer Neg Hx     Social History   Tobacco Use  . Smoking status: Former Smoker    Packs/day: 1.50    Years: 15.00    Pack years: 22.50    Types: Cigarettes    Quit date: 11/09/1981    Years since quitting: 39.3  . Smokeless tobacco: Never Used  . Tobacco comment: no plans to start  Vaping Use  . Vaping Use: Never used  Substance Use Topics  . Alcohol use: No  . Drug use: No    ROS   Objective:   Vitals: BP (!) 144/68   Pulse 85   Temp 98 F (36.7 C)   Resp 18   LMP  (LMP Unknown)   SpO2 100%   Physical Exam Constitutional:      General: She is not in acute distress.    Appearance: Normal appearance. She is well-developed. She is not ill-appearing, toxic-appearing or diaphoretic.  HENT:     Head:  Normocephalic and atraumatic.     Nose: Nose normal.     Mouth/Throat:     Mouth: Mucous membranes are moist.     Pharynx: Oropharynx is clear.  Eyes:     General: No scleral icterus.       Right eye: No discharge.        Left eye: No discharge.     Extraocular Movements: Extraocular movements  intact.     Conjunctiva/sclera: Conjunctivae normal.     Pupils: Pupils are equal, round, and reactive to light.  Cardiovascular:     Rate and Rhythm: Normal rate.  Pulmonary:     Effort: Pulmonary effort is normal.  Musculoskeletal:     Lumbar back: Spasms and tenderness present. No swelling, edema, deformity, signs of trauma, lacerations or bony tenderness. Decreased range of motion. Negative right straight leg raise test and negative left straight leg raise test. No scoliosis.     Comments: Ambulates with a cane secondary to the left ankle fracture she suffered in November.  Skin:    General: Skin is warm and dry.  Neurological:     General: No focal deficit present.     Mental Status: She is alert and oriented to person, place, and time.     Motor: No weakness.     Coordination: Coordination normal.     Gait: Gait normal.     Deep Tendon Reflexes: Reflexes normal.  Psychiatric:        Mood and Affect: Mood normal.        Behavior: Behavior normal.        Thought Content: Thought content normal.        Judgment: Judgment normal.     DG Lumbar Spine Complete  Result Date: 02/23/2021 CLINICAL DATA:  Low back pain. EXAM: LUMBAR SPINE - COMPLETE 4+ VIEW COMPARISON:  None. FINDINGS: Bones are diffusely demineralized. Loss of disc height noted L4-5. Mild facet degeneration noted lower lumbar spine. SI joints unremarkable. Left hip replacement. Atherosclerotic calcification noted abdominal aorta. IMPRESSION: Diffuse demineralization with degenerative changes in the lower lumbar spine. Aortic Atherosclerois (ICD10-170.0) Electronically Signed   By: Misty Stanley M.D.   On: 02/23/2021 10:16      Assessment and Plan :   PDMP not reviewed this encounter.  1. Acute bilateral low back pain without sciatica   2. Osteopenia determined by x-ray   3. Chronic bilateral low back pain without sciatica   4. Degenerative disc disease, lumbar     Recommend starting prednisone course, scheduling Tylenol for pain control.  Follow-up with neuro spine specialist. Counseled patient on potential for adverse effects with medications prescribed/recommended today, ER and return-to-clinic precautions discussed, patient verbalized understanding.    Jaynee Eagles, PA-C 02/23/21 1026

## 2021-02-23 NOTE — ED Triage Notes (Signed)
Pt in with c/o lower back and shoulder blade pain that has been going on for 3 weeks now  Pt has been taking tylenol with temporary relief

## 2021-02-23 NOTE — Discharge Instructions (Addendum)
We will use prednisone to help with your back pain given your arthritis and decreased bone density.  I highly recommend that you establish care with the neurosurgery spine specialist. Do not use any nonsteroidal anti-inflammatories (NSAIDs) like ibuprofen, Motrin, naproxen, Aleve, etc. which are all available over-the-counter.  Please just use Tylenol at a dose of 500mg -650mg  once every 6 hours as needed for your aches, pains, fevers.  Okay to use Robaxin (methocarbamol) for your back pain as well.

## 2021-02-24 ENCOUNTER — Encounter: Payer: Self-pay | Admitting: Cardiology

## 2021-02-24 ENCOUNTER — Ambulatory Visit (INDEPENDENT_AMBULATORY_CARE_PROVIDER_SITE_OTHER): Payer: Medicare Other | Admitting: Cardiology

## 2021-02-24 VITALS — BP 124/64 | HR 96 | Ht 63.0 in | Wt 186.8 lb

## 2021-02-24 DIAGNOSIS — I2510 Atherosclerotic heart disease of native coronary artery without angina pectoris: Secondary | ICD-10-CM | POA: Diagnosis not present

## 2021-02-24 DIAGNOSIS — I482 Chronic atrial fibrillation, unspecified: Secondary | ICD-10-CM

## 2021-02-24 DIAGNOSIS — E785 Hyperlipidemia, unspecified: Secondary | ICD-10-CM

## 2021-02-24 DIAGNOSIS — I10 Essential (primary) hypertension: Secondary | ICD-10-CM

## 2021-02-24 DIAGNOSIS — E118 Type 2 diabetes mellitus with unspecified complications: Secondary | ICD-10-CM | POA: Diagnosis not present

## 2021-02-24 NOTE — Patient Instructions (Signed)
Medication Instructions:  No changes *If you need a refill on your cardiac medications before your next appointment, please call your pharmacy*  Follow-Up: At [facility], you and your health needs are our priority.  As part of our continuing mission to provide you with exceptional heart care, we have created designated Provider Care Teams.  These Care Teams include your primary Cardiologist (physician) and Advanced Practice Providers (APPs -  Physician Assistants and Nurse Practitioners) who all work together to provide you with the care you need, when you need it.    Your next appointment:   12 month(s) You will receive a reminder letter in the mail two months in advance. If you don't receive a letter, please call our office to schedule the follow-up appointment.  The format for your next appointment:   In Person  Provider:   Minus Breeding, MD

## 2021-02-25 ENCOUNTER — Other Ambulatory Visit: Payer: Self-pay | Admitting: Neurosurgery

## 2021-02-25 DIAGNOSIS — M4310 Spondylolisthesis, site unspecified: Secondary | ICD-10-CM

## 2021-03-08 ENCOUNTER — Ambulatory Visit (HOSPITAL_COMMUNITY)
Admission: EM | Admit: 2021-03-08 | Discharge: 2021-03-08 | Disposition: A | Discharge status: Home / Self Care | Payer: Medicare Other

## 2021-03-08 ENCOUNTER — Encounter (HOSPITAL_COMMUNITY): Payer: Self-pay

## 2021-03-08 ENCOUNTER — Other Ambulatory Visit: Payer: Self-pay

## 2021-03-08 DIAGNOSIS — M545 Low back pain, unspecified: Secondary | ICD-10-CM | POA: Diagnosis not present

## 2021-03-08 DIAGNOSIS — R1030 Lower abdominal pain, unspecified: Secondary | ICD-10-CM

## 2021-03-08 DIAGNOSIS — G8929 Other chronic pain: Secondary | ICD-10-CM

## 2021-03-08 DIAGNOSIS — M549 Dorsalgia, unspecified: Secondary | ICD-10-CM

## 2021-03-08 NOTE — ED Provider Notes (Signed)
[place]    CSN: [id number] Arrival date & time: 03/08/21  1033      History   Chief Complaint Chief Complaint  Patient presents with  . Back Pain    HPI [name] is a 70 y.o. female.   Patient presents with chronic low back pain which she reports is worse x5 weeks.  No recent falls or injury.  She also reports mid to upper back pain and lower abdominal "spasm" at night.  She denies fever, chills, cough, shortness of breath, vomiting, diarrhea, dysuria, or other symptoms.  Treatment attempted at home with Tylenol.  Patient was seen here for back pain on 02/23/2021; diagnosed with acute bilateral low back pain without sciatica, osteopenia, chronic low back pain, DDD; x-ray at that time showed degenerative changes in lower lumbar spine; treated with prednisone, Tylenol; instructed patient to follow-up with neuro spine specialist.  She reports the prednisone did not help her back pain.  She states she was seen by Dr. Trenton Gammon, neurosurgeon, last week and has an MRI scheduled for the 03/14/2021.  Patient's medical history includes hypertension, atrial fibrillation, old MI, CKD, diabetes, peripheral neuropathy, osteoarthritis, chronic low back pain.  The history is provided by the patient and medical records.    Past Medical History:  Diagnosis Date  . Arthritis    wrist, hands -  . Atrial fibrillation, persistent ([place])   . Bladder tumor   . CAD (coronary artery disease) cardiologist-  dr hochrien   PTCA RCA 1999  . Cataract   . Diabetes ([place])   . Full dentures   . GERD (gastroesophageal reflux disease)   . History of colon polyps    2005  hyperplastic  . History of MI (myocardial infarction)    1999  . HLD (hyperlipidemia)   . Hypertension   . Hypothyroidism   . SUI (stress urinary incontinence, female)     Patient Active Problem List   Diagnosis Date Noted  . Chronic midline low back pain without sciatica 02/03/2021  . AKI (acute kidney injury) ([place])  11/16/2020  . Closed trimalleolar fracture of left ankle 11/09/2020  . Elbow pain, left 08/31/2020  . Conductive hearing loss, bilateral 07/25/2020  . Peripheral neuropathy 06/15/2020  . Chronic kidney disease (CKD), stage III (moderate) ([place]) 12/06/2019  . Dyslipidemia 11/25/2019  . Osteoarthritis of right knee 07/11/2019  . Microalbuminuria 12/07/2018  . OSA (obstructive sleep apnea) 03/16/2018  . History of bladder cancer 08/24/2017  . Claudication ([place]) 07/21/2017  . Onychomycosis of toenail 06/09/2017  . Long-term (current) use of anticoagulants 02/19/2015  . Diabetes ([place]) 04/11/2014  . Atrial fibrillation ([place]) 12/18/2013  . Urinary incontinence 05/06/2011  . HLD (hyperlipidemia) 04/29/2009  . Obesity 04/29/2009  . Essential hypertension, benign 04/29/2009  . Coronary atherosclerosis 04/29/2009  . Hypothyroidism 02/23/2007  . MYOCARDIAL INFARCTION, OLD 02/23/2007  . Personal history of colonic adenoma 11/11/2004    Past Surgical History:  Procedure Laterality Date  . CARDIOVASCULAR STRESS TEST  05-01-2009   dr hochrein   normal nuclear study/  no ischemia or scar/  normal LV function and wall motion, ef 71%  . COLONOSCOPY     multiple  . CORONARY ANGIOPLASTY  Feb 1999   PTCA to RCA  . D & C HYSTEROSCOPY/  POLYPECTOMY  05-08-2009  . HYSTEROSCOPY WITH D & C  12/07/2012   Procedure: DILATATION AND CURETTAGE /HYSTEROSCOPY;  Surgeon: Elveria Royals, MD;  Location: [facility];  Service: Gynecology;  Laterality: N/A;  Dilitation  and curettage /hysteroscopy/biopsey of lower segment of uterus  . ORIF ANKLE FRACTURE Left 11/10/2020   Procedure: OPEN REDUCTION INTERNAL FIXATION (ORIF) ANKLE FRACTURE;  Surgeon: Shona Needles, MD;  Location: [facility];  Service: Orthopedics;  Laterality: Left;  . TOTAL HIP ARTHROPLASTY Left 12-11-2010  . TRANSTHORACIC ECHOCARDIOGRAM  09-11-2015   mild LVH, ef 55%,  mildly reduced RVSF,  trivial TR  . TRANSURETHRAL RESECTION OF BLADDER TUMOR N/A  12/01/2015   Procedure: TRANSURETHRAL RESECTION OF BLADDER TUMOR (TURBT);  Surgeon: Kathie Rhodes, MD;  Location: [facility];  Service: Urology;  Laterality: N/A;  . TUBAL LIGATION  1974    OB History   No obstetric history on file.      Home Medications    Prior to Admission medications   Medication Sig Start Date End Date Taking? Authorizing Provider  acetaminophen (TYLENOL) 500 MG tablet Take 500 mg by mouth every 6 (six) hours as needed for headache (pain).    [provider]  CALCIUM CARBONATE ANTACID PO Take 1 tablet by mouth at bedtime as needed for indigestion or heartburn.     [provider]  diltiazem (CARDIZEM CD) 240 MG 24 hr capsule TAKE 1 CAPSULE BY MOUTH EVERY DAY 01/30/21   Minus Breeding, MD  ELIQUIS 5 MG TABS tablet TAKE 1 TABLET(5 MG) BY MOUTH TWICE DAILY 12/22/20   Minus Breeding, MD  levothyroxine (SYNTHROID) 100 MCG tablet TAKE 1 TABLET(100 MCG) BY MOUTH DAILY 01/30/21   Benay Pike, MD  Lidocaine (HM LIDOCAINE PATCH) 4 % PTCH Apply once per day as needed to lower back. 02/02/21   Benay Pike, MD  lisinopril (ZESTRIL) 2.5 MG tablet TAKE 1 TABLET(2.5 MG) BY MOUTH DAILY 01/30/21   Benay Pike, MD  loratadine (CLARITIN) 10 MG tablet Take 10 mg by mouth daily.    [provider]  metFORMIN (GLUCOPHAGE-XR) 500 MG 24 hr tablet TAKE 1 TABLET(500 MG) BY MOUTH TWICE DAILY 11/18/20   Benay Pike, MD  methocarbamol (ROBAXIN) 500 MG tablet Take 1 tablet (500 mg total) by mouth 3 (three) times daily. 02/23/21   Jaynee Eagles, PA-C  ONETOUCH DELICA LANCETS FINE MISC TEST TWICE DAILY( 8AM AND 10PM) Patient taking differently: 2 (two) times daily. 10/03/17   Glenis Smoker, MD  Polyethyl Glycol-Propyl Glycol (SYSTANE) 0.4-0.3 % SOLN Place 1 drop into both eyes daily as needed (dry eyes).    [provider]  predniSONE (DELTASONE) 20 MG tablet Take 2 tablets daily with breakfast. 02/23/21   Jaynee Eagles, PA-C   rosuvastatin (CRESTOR) 40 MG tablet TAKE 1 TABLET(40 MG) BY MOUTH DAILY 12/29/20   Minus Breeding, MD  senna (SENOKOT) 8.6 MG TABS tablet Take 1 tablet (8.6 mg total) by mouth daily as needed for mild constipation. 11/13/20   Benay Pike, MD  diltiazem (DILACOR XR) 120 MG 24 hr capsule Take 1 capsule (120 mg total) by mouth daily. 09/15/11 05/15/12  Minus Breeding, MD    Family History Family History  Problem Relation Age of Onset  . Diabetes Mother   . Heart disease Mother   . Colon cancer Mother 61  . Heart disease Father   . Cancer Sister        Lung  . Cancer Brother        intestinal  . Atrial fibrillation Brother   . Cancer Brother        throat ca  . Cancer Brother        bladder  .  Breast cancer Neg Hx     Social History Social History   Tobacco Use  . Smoking status: Former Smoker    Packs/day: 1.50    Years: 15.00    Pack years: 22.50    Types: Cigarettes    Quit date: 11/09/1981    Years since quitting: 39.3  . Smokeless tobacco: Never Used  . Tobacco comment: no plans to start  Vaping Use  . Vaping Use: Never used  Substance Use Topics  . Alcohol use: No  . Drug use: No     Allergies   Acetaminophen, Oxycodone hcl, Penicillins, and Percocet [oxycodone-acetaminophen]   Review of Systems Review of Systems  Constitutional: Negative for chills and fever.  HENT: Negative for ear pain and sore throat.   Eyes: Negative for pain and visual disturbance.  Respiratory: Negative for cough and shortness of breath.   Cardiovascular: Negative for chest pain and palpitations.  Gastrointestinal: Positive for abdominal pain. Negative for diarrhea and vomiting.  Genitourinary: Negative for dysuria and hematuria.  Musculoskeletal: Positive for back pain. Negative for arthralgias.  Skin: Negative for color change and rash.  Neurological: Negative for syncope, weakness and numbness.  All other systems reviewed and are negative.    Physical Exam Triage Vital  Signs ED Triage Vitals  Enc Vitals Group     BP      Pulse      Resp      Temp      Temp src      SpO2      Weight      Height      Head Circumference      Peak Flow      Pain Score      Pain Loc      Pain Edu?      Excl. in [place]?    No data found.  Updated Vital Signs BP 124/62 (BP Location: Right Arm)   Pulse 84   Temp 97.6 F (36.4 C) (Oral)   Resp (!) 27   LMP  (LMP Unknown)   SpO2 97%   Visual Acuity Right Eye Distance:   Left Eye Distance:   Bilateral Distance:    Right Eye Near:   Left Eye Near:    Bilateral Near:     Physical Exam Vitals and nursing note reviewed.  Constitutional:      General: She is not in acute distress.    Appearance: She is well-developed. She is not ill-appearing.  HENT:     Head: Normocephalic and atraumatic.     Mouth/Throat:     Mouth: Mucous membranes are moist.  Eyes:     Conjunctiva/sclera: Conjunctivae normal.  Cardiovascular:     Rate and Rhythm: Normal rate and regular rhythm.     Heart sounds: Normal heart sounds.  Pulmonary:     Effort: Pulmonary effort is normal. No respiratory distress.     Breath sounds: Normal breath sounds.  Abdominal:     Palpations: Abdomen is soft.     Tenderness: There is no abdominal tenderness. There is no guarding or rebound.  Musculoskeletal:        General: No swelling, tenderness or deformity. Normal range of motion.     Cervical back: Neck supple.  Skin:    General: Skin is warm and dry.     Findings: No bruising, erythema, lesion or rash.  Neurological:     General: No focal deficit present.     Mental Status: She is  alert and oriented to person, place, and time.     Sensory: No sensory deficit.     Motor: No weakness.     Gait: Gait abnormal.     Comments: Ambulatory with cane.  Psychiatric:        Mood and Affect: Mood normal.        Behavior: Behavior normal.      UC Treatments / Results  Labs (all labs ordered are listed, but only abnormal results are  displayed) Labs Reviewed - No data to display  EKG   Radiology No results found.  Procedures Procedures (including critical care time)  Medications Ordered in UC Medications - No data to display  Initial Impression / Assessment and Plan / UC Course  I have reviewed the triage vital signs and the nursing notes.  Pertinent labs & imaging results that were available during my care of the patient were reviewed by me and considered in my medical decision making (see chart for details).   Chronic bilateral low back pain without sciatica.  Instructed patient to continue taking Tylenol and using lidocaine patches for her low back pain.  To follow-up with her PCP or neurosurgeon tomorrow morning to discuss pain control.  Patient agrees to plan of care.   Final Clinical Impressions(s) / UC Diagnoses   Final diagnoses:  Chronic bilateral low back pain without sciatica  Mid back pain  Lower abdominal pain     Discharge Instructions     According to your medical record, you have a refill available at the pharmacy for your lidocaine patches.    Continue to take Tylenol and use the lidocaine patches as needed for your lower back pain.  Follow-up with your primary care provider tomorrow morning to discuss pain control.        ED Prescriptions    None     I have reviewed the PDMP during this encounter.   Sharion Balloon, NP 03/08/21 1114

## 2021-03-08 NOTE — Discharge Instructions (Signed)
According to your medical record, you have a refill available at the pharmacy for your lidocaine patches.    Continue to take Tylenol and use the lidocaine patches as needed for your lower back pain.  Follow-up with your primary care provider tomorrow morning to discuss pain control.

## 2021-03-08 NOTE — ED Triage Notes (Signed)
Pt presents with lower back pan, right scapular pain, lowe abdominal spasm at night  x 5 weeks.Denies chest pan, weakness. Tylenol gives relief x 1 hr.   Reports she has an MRI on 03/14/21.

## 2021-03-09 ENCOUNTER — Encounter: Payer: Self-pay | Admitting: Family Medicine

## 2021-03-09 ENCOUNTER — Ambulatory Visit (INDEPENDENT_AMBULATORY_CARE_PROVIDER_SITE_OTHER): Payer: Medicare Other | Admitting: Family Medicine

## 2021-03-09 ENCOUNTER — Other Ambulatory Visit: Payer: Self-pay

## 2021-03-09 VITALS — BP 120/70 | HR 95 | Ht 63.0 in | Wt 186.0 lb

## 2021-03-09 DIAGNOSIS — M545 Low back pain, unspecified: Secondary | ICD-10-CM

## 2021-03-09 DIAGNOSIS — G8929 Other chronic pain: Secondary | ICD-10-CM

## 2021-03-09 MED ORDER — OXYCODONE HCL 5 MG PO TABS
2.5000 mg | ORAL_TABLET | Freq: Four times a day (QID) | ORAL | 0 refills | Status: DC | PRN
Start: 2021-03-09 — End: 2021-03-31

## 2021-03-09 NOTE — Progress Notes (Signed)
    SUBJECTIVE:   CHIEF COMPLAINT / HPI:   Patient continues to have low back pain since our last visit.  She has gone to the urgent care on the 28th and was prescribed muscle relaxants and steroids.  She has finished her course of both of these.  States that may have helped a little bit.  Currently only taking Tylenol 650 every 4 hours. The lidocaine patch previously given on our last visit did not help much either.  She uses Voltaren gel 4-5 times a day.  Patient has never experienced back pain this severe before.  Has urge incontinence at night but no saddle anesthesia and no urinating without realizing she needs to go.  Patient has no recollection of inciting trauma, but states her daughter thinks it may have been due to when she fell and broke her ankle.  Patient has taken tramadol and oxycodone in the past.  Has tolerated both of these before.  Denies any numbness or tingling down the legs.  In the past week she is started to have upper thoracic middle back pain as well.  The patient has talked with her orthopedist and schedule an appointment for the end of this month and has an MRI scheduled for this Saturday.   PERTINENT  PMH / PSH: A. fib, arthritis, peripheral neuropathy, fracture of the ankle.  OBJECTIVE:   BP 120/70   Pulse 95   Ht 5\' 3"  (1.6 m)   Wt 186 lb (84.4 kg)   LMP  (LMP Unknown)   SpO2 97%   BMI 32.95 kg/m   General: Alert, oriented.  Moderate amount of pain.  Using a cane.  Appears stated age. CV: Regular rate and rhythm, no murmurs Pulmonary: Lungs clear to auscultation bilaterally, no wheezes or crackles. MSK: Tender to palpation in the thoracic region between the shoulder blades.  Tender to palpation in the L4/L5 region 9 and radiating out bilaterally.  No tenderness of the trochanter.  Normal straight leg and cross leg test bilaterally.  Normal sensation in lower extremities bilaterally.  ASSESSMENT/PLAN:   Chronic midline low back pain without sciatica Ongoing  for over 1 month now.  Appears to be worsening.  No red flag signs or symptoms.  No sciatica.  Likely secondary to degenerative disc disease or arthritis.  Most recent x-ray of the lumbar spine shows anterolisthesis of the L4-L5 and degenerative changes.  Getting MRI this Saturday and has an appointment with the orthopedist at the end of this month.  Will prescribe if patient 2.5 to 5 mg of oxycodone every 6 hours as needed to help with the pain.  Advised her she can take 1000 mg of Tylenol  3 times a day scheduled.  Advised her to continue using her topicals.  Cannot use NSAIDs due to her anticoagulant use.  Just finished a course of steroids so we will not start a new course.  Not prescribing muscle relaxants as this does not appear to be muscular in nature.     Benay Pike, MD [place]

## 2021-03-09 NOTE — Patient Instructions (Signed)
It was nice to see you today,  I have prescribed oxycodone for you to use as needed.  Try to use half a tablet every 6 hours.  If that does not help try using a full tablet.  I would also like you to use scheduled Tylenol you can use 1000 mg of Tylenol every 8 hours.  I will follow results of your MRI.  If this does not help or you have any questions, please call us.  Otherwise schedule appointment with me as needed.  Have a great day,  [name], MD

## 2021-03-10 NOTE — Assessment & Plan Note (Signed)
Ongoing for over 1 month now.  Appears to be worsening.  No red flag signs or symptoms.  No sciatica.  Likely secondary to degenerative disc disease or arthritis.  Most recent x-ray of the lumbar spine shows anterolisthesis of the L4-L5 and degenerative changes.  Getting MRI this Saturday and has an appointment with the orthopedist at the end of this month.  Will prescribe if patient 2.5 to 5 mg of oxycodone every 6 hours as needed to help with the pain.  Advised her she can take 1000 mg of Tylenol  3 times a day scheduled.  Advised her to continue using her topicals.  Cannot use NSAIDs due to her anticoagulant use.  Just finished a course of steroids so we will not start a new course.  Not prescribing muscle relaxants as this does not appear to be muscular in nature.

## 2021-03-11 DIAGNOSIS — G4733 Obstructive sleep apnea (adult) (pediatric): Secondary | ICD-10-CM | POA: Diagnosis not present

## 2021-03-11 DIAGNOSIS — R531 Weakness: Secondary | ICD-10-CM | POA: Diagnosis not present

## 2021-03-11 DIAGNOSIS — M1711 Unilateral primary osteoarthritis, right knee: Secondary | ICD-10-CM | POA: Diagnosis not present

## 2021-03-11 DIAGNOSIS — S82852A Displaced trimalleolar fracture of left lower leg, initial encounter for closed fracture: Secondary | ICD-10-CM | POA: Diagnosis not present

## 2021-03-11 DIAGNOSIS — G629 Polyneuropathy, unspecified: Secondary | ICD-10-CM | POA: Diagnosis not present

## 2021-03-14 ENCOUNTER — Ambulatory Visit
Admission: RE | Admit: 2021-03-14 | Discharge: 2021-03-14 | Disposition: A | Discharge status: Home / Self Care | Payer: Medicare Other | Source: Ambulatory Visit | Attending: Neurosurgery | Admitting: Neurosurgery

## 2021-03-14 ENCOUNTER — Other Ambulatory Visit: Payer: Self-pay

## 2021-03-14 DIAGNOSIS — M4310 Spondylolisthesis, site unspecified: Secondary | ICD-10-CM

## 2021-03-14 DIAGNOSIS — M48061 Spinal stenosis, lumbar region without neurogenic claudication: Secondary | ICD-10-CM | POA: Diagnosis not present

## 2021-03-14 DIAGNOSIS — M545 Low back pain, unspecified: Secondary | ICD-10-CM | POA: Diagnosis not present

## 2021-03-15 ENCOUNTER — Emergency Department (HOSPITAL_COMMUNITY): Payer: Medicare Other

## 2021-03-15 ENCOUNTER — Other Ambulatory Visit: Payer: Self-pay

## 2021-03-15 ENCOUNTER — Encounter (HOSPITAL_COMMUNITY): Payer: Self-pay | Admitting: Emergency Medicine

## 2021-03-15 ENCOUNTER — Emergency Department (HOSPITAL_COMMUNITY)
Admission: EM | Admit: 2021-03-15 | Discharge: 2021-03-15 | Disposition: A | Discharge status: Home / Self Care | Payer: Medicare Other | Attending: Emergency Medicine | Admitting: Emergency Medicine

## 2021-03-15 DIAGNOSIS — Z7984 Long term (current) use of oral hypoglycemic drugs: Secondary | ICD-10-CM | POA: Insufficient documentation

## 2021-03-15 DIAGNOSIS — Z87891 Personal history of nicotine dependence: Secondary | ICD-10-CM | POA: Insufficient documentation

## 2021-03-15 DIAGNOSIS — I2510 Atherosclerotic heart disease of native coronary artery without angina pectoris: Secondary | ICD-10-CM | POA: Insufficient documentation

## 2021-03-15 DIAGNOSIS — N3000 Acute cystitis without hematuria: Secondary | ICD-10-CM | POA: Diagnosis not present

## 2021-03-15 DIAGNOSIS — Z79899 Other long term (current) drug therapy: Secondary | ICD-10-CM | POA: Diagnosis not present

## 2021-03-15 DIAGNOSIS — E039 Hypothyroidism, unspecified: Secondary | ICD-10-CM | POA: Insufficient documentation

## 2021-03-15 DIAGNOSIS — Z7901 Long term (current) use of anticoagulants: Secondary | ICD-10-CM | POA: Diagnosis not present

## 2021-03-15 DIAGNOSIS — K219 Gastro-esophageal reflux disease without esophagitis: Secondary | ICD-10-CM | POA: Diagnosis not present

## 2021-03-15 DIAGNOSIS — R918 Other nonspecific abnormal finding of lung field: Secondary | ICD-10-CM | POA: Insufficient documentation

## 2021-03-15 DIAGNOSIS — R109 Unspecified abdominal pain: Secondary | ICD-10-CM | POA: Diagnosis not present

## 2021-03-15 DIAGNOSIS — I700 Atherosclerosis of aorta: Secondary | ICD-10-CM | POA: Diagnosis not present

## 2021-03-15 DIAGNOSIS — N183 Chronic kidney disease, stage 3 unspecified: Secondary | ICD-10-CM | POA: Diagnosis not present

## 2021-03-15 DIAGNOSIS — Z8551 Personal history of malignant neoplasm of bladder: Secondary | ICD-10-CM | POA: Insufficient documentation

## 2021-03-15 DIAGNOSIS — Z96652 Presence of left artificial knee joint: Secondary | ICD-10-CM | POA: Insufficient documentation

## 2021-03-15 DIAGNOSIS — I129 Hypertensive chronic kidney disease with stage 1 through stage 4 chronic kidney disease, or unspecified chronic kidney disease: Secondary | ICD-10-CM | POA: Insufficient documentation

## 2021-03-15 DIAGNOSIS — E119 Type 2 diabetes mellitus without complications: Secondary | ICD-10-CM | POA: Diagnosis not present

## 2021-03-15 DIAGNOSIS — J929 Pleural plaque without asbestos: Secondary | ICD-10-CM | POA: Diagnosis not present

## 2021-03-15 DIAGNOSIS — J432 Centrilobular emphysema: Secondary | ICD-10-CM | POA: Diagnosis not present

## 2021-03-15 DIAGNOSIS — I517 Cardiomegaly: Secondary | ICD-10-CM | POA: Diagnosis not present

## 2021-03-15 LAB — COMPREHENSIVE METABOLIC PANEL
ALT: 18 U/L (ref 0–44)
AST: 23 U/L (ref 15–41)
Albumin: 3.4 g/dL — ABNORMAL LOW (ref 3.5–5.0)
Alkaline Phosphatase: 65 U/L (ref 38–126)
Anion gap: 12 (ref 5–15)
BUN: 21 mg/dL (ref 8–23)
CO2: 22 mmol/L (ref 22–32)
Calcium: 9.2 mg/dL (ref 8.9–10.3)
Chloride: 101 mmol/L (ref 98–111)
Creatinine, Ser: 1.04 mg/dL — ABNORMAL HIGH (ref 0.44–1.00)
GFR, Estimated: 57 mL/min — ABNORMAL LOW (ref >60)
Glucose, Bld: 131 mg/dL — ABNORMAL HIGH (ref 70–99)
Potassium: 4.3 mmol/L (ref 3.5–5.1)
Sodium: 135 mmol/L (ref 135–145)
Total Bilirubin: 0.6 mg/dL (ref 0.3–1.2)
Total Protein: 7.8 g/dL (ref 6.5–8.1)

## 2021-03-15 LAB — URINALYSIS, ROUTINE W REFLEX MICROSCOPIC
Bacteria, UA: NONE SEEN
Bilirubin Urine: NEGATIVE
Glucose, UA: NEGATIVE mg/dL
Ketones, ur: NEGATIVE mg/dL
Nitrite: NEGATIVE
Protein, ur: 100 mg/dL — AB
Specific Gravity, Urine: 1.034 — ABNORMAL HIGH (ref 1.005–1.030)
pH: 5 (ref 5.0–8.0)

## 2021-03-15 LAB — CBC WITH DIFFERENTIAL/PLATELET
Abs Immature Granulocytes: 0.05 10*3/uL (ref 0.00–0.07)
Basophils Absolute: 0 10*3/uL (ref 0.0–0.1)
Basophils Relative: 0 %
Eosinophils Absolute: 0.1 10*3/uL (ref 0.0–0.5)
Eosinophils Relative: 1 %
HCT: 35.1 % — ABNORMAL LOW (ref 36.0–46.0)
Hemoglobin: 11.2 g/dL — ABNORMAL LOW (ref 12.0–15.0)
Immature Granulocytes: 0 %
Lymphocytes Relative: 19 %
Lymphs Abs: 2.1 10*3/uL (ref 0.7–4.0)
MCH: 28.1 pg (ref 26.0–34.0)
MCHC: 31.9 g/dL (ref 30.0–36.0)
MCV: 88 fL (ref 80.0–100.0)
Monocytes Absolute: 1.1 10*3/uL — ABNORMAL HIGH (ref 0.1–1.0)
Monocytes Relative: 10 %
Neutro Abs: 7.8 10*3/uL — ABNORMAL HIGH (ref 1.7–7.7)
Neutrophils Relative %: 70 %
Platelets: 321 10*3/uL (ref 150–400)
RBC: 3.99 MIL/uL (ref 3.87–5.11)
RDW: 14.6 % (ref 11.5–15.5)
WBC: 11.2 10*3/uL — ABNORMAL HIGH (ref 4.0–10.5)
nRBC: 0 % (ref 0.0–0.2)

## 2021-03-15 LAB — D-DIMER, QUANTITATIVE: D-Dimer, Quant: 0.37 ug/mL-FEU (ref 0.00–0.50)

## 2021-03-15 MED ORDER — IOHEXOL 300 MG/ML  SOLN
75.0000 mL | Freq: Once | INTRAMUSCULAR | Status: AC | PRN
  Administered 2021-03-15: 75 mL via INTRAVENOUS

## 2021-03-15 MED ORDER — ONDANSETRON HCL 4 MG/2ML IJ SOLN: 4.0000 mg | Freq: Once | INTRAMUSCULAR | Status: DC

## 2021-03-15 MED ORDER — HYDROMORPHONE HCL 1 MG/ML IJ SOLN
1.0000 mg | Freq: Once | INTRAMUSCULAR | Status: AC
  Administered 2021-03-15: 1 mg via INTRAVENOUS
  Filled 2021-03-15: qty 1

## 2021-03-15 MED ORDER — LEVOFLOXACIN 500 MG PO TABS: 500.0000 mg | ORAL_TABLET | Freq: Every day | ORAL | 0 refills | Status: DC

## 2021-03-15 MED ORDER — HYDROMORPHONE HCL 4 MG PO TABS: 4.0000 mg | ORAL_TABLET | Freq: Four times a day (QID) | ORAL | 0 refills | Status: DC | PRN

## 2021-03-15 MED ORDER — ONDANSETRON HCL 4 MG/2ML IJ SOLN
4.0000 mg | Freq: Once | INTRAMUSCULAR | Status: AC
  Administered 2021-03-15: 4 mg via INTRAVENOUS
  Filled 2021-03-15: qty 2

## 2021-03-15 MED ORDER — LEVOFLOXACIN 500 MG PO TABS
500.0000 mg | ORAL_TABLET | Freq: Once | ORAL | Status: AC
  Administered 2021-03-15: 500 mg via ORAL
  Filled 2021-03-15: qty 1

## 2021-03-15 NOTE — ED Provider Notes (Signed)
[facility] DEPT Provider Note   CSN: [id number] Arrival date & time: 03/15/21  1758     History Chief Complaint  Patient presents with  . Back Pain    [name] is a 70 y.o. female.  Patient complains of left flank pain.  No fever no chills no cough.  The history is provided by the patient. No language interpreter was used.  Back Pain Pain location: Left flank pain. Quality:  Aching Radiates to:  Does not radiate Pain severity:  Moderate Pain is:  Same all the time Onset quality:  Gradual Timing:  Constant Progression:  Worsening Associated symptoms: no abdominal pain, no chest pain and no headaches        Past Medical History:  Diagnosis Date  . Arthritis    wrist, hands -  . Atrial fibrillation, persistent ([place])   . Bladder tumor   . CAD (coronary artery disease) cardiologist-  dr hochrien   PTCA RCA 1999  . Cataract   . Diabetes ([place])   . Full dentures   . GERD (gastroesophageal reflux disease)   . History of colon polyps    2005  hyperplastic  . History of MI (myocardial infarction)    1999  . HLD (hyperlipidemia)   . Hypertension   . Hypothyroidism   . SUI (stress urinary incontinence, female)     Patient Active Problem List   Diagnosis Date Noted  . Chronic midline low back pain without sciatica 02/03/2021  . AKI (acute kidney injury) ([place]) 11/16/2020  . Closed trimalleolar fracture of left ankle 11/09/2020  . Elbow pain, left 08/31/2020  . Conductive hearing loss, bilateral 07/25/2020  . Peripheral neuropathy 06/15/2020  . Chronic kidney disease (CKD), stage III (moderate) ([place]) 12/06/2019  . Dyslipidemia 11/25/2019  . Osteoarthritis of right knee 07/11/2019  . Microalbuminuria 12/07/2018  . OSA (obstructive sleep apnea) 03/16/2018  . History of bladder cancer 08/24/2017  . Claudication ([place]) 07/21/2017  . Onychomycosis of toenail 06/09/2017  . Long-term (current) use of anticoagulants 02/19/2015  .  Diabetes ([place]) 04/11/2014  . Atrial fibrillation ([place]) 12/18/2013  . Urinary incontinence 05/06/2011  . HLD (hyperlipidemia) 04/29/2009  . Obesity 04/29/2009  . Essential hypertension, benign 04/29/2009  . Coronary atherosclerosis 04/29/2009  . Hypothyroidism 02/23/2007  . MYOCARDIAL INFARCTION, OLD 02/23/2007  . Personal history of colonic adenoma 11/11/2004    Past Surgical History:  Procedure Laterality Date  . CARDIOVASCULAR STRESS TEST  05-01-2009   dr hochrein   normal nuclear study/  no ischemia or scar/  normal LV function and wall motion, ef 71%  . COLONOSCOPY     multiple  . CORONARY ANGIOPLASTY  Feb 1999   PTCA to RCA  . D & C HYSTEROSCOPY/  POLYPECTOMY  05-08-2009  . HYSTEROSCOPY WITH D & C  12/07/2012   Procedure: DILATATION AND CURETTAGE /HYSTEROSCOPY;  Surgeon: Elveria Royals, MD;  Location: [facility];  Service: Gynecology;  Laterality: N/A;  Dilitation and curettage /hysteroscopy/biopsey of lower segment of uterus  . ORIF ANKLE FRACTURE Left 11/10/2020   Procedure: OPEN REDUCTION INTERNAL FIXATION (ORIF) ANKLE FRACTURE;  Surgeon: Shona Needles, MD;  Location: [facility];  Service: Orthopedics;  Laterality: Left;  . TOTAL HIP ARTHROPLASTY Left 12-11-2010  . TRANSTHORACIC ECHOCARDIOGRAM  09-11-2015   mild LVH, ef 55%,  mildly reduced RVSF,  trivial TR  . TRANSURETHRAL RESECTION OF BLADDER TUMOR N/A 12/01/2015   Procedure: TRANSURETHRAL RESECTION OF BLADDER TUMOR (TURBT);  Surgeon: Kathie Rhodes, MD;  Location: [facility];  Service: Urology;  Laterality: N/A;  . TUBAL LIGATION  1974     OB History   No obstetric history on file.     Family History  Problem Relation Age of Onset  . Diabetes Mother   . Heart disease Mother   . Colon cancer Mother 1  . Heart disease Father   . Cancer Sister        Lung  . Cancer Brother        intestinal  . Atrial fibrillation Brother   . Cancer Brother        throat ca  . Cancer Brother        bladder  .  Breast cancer Neg Hx     Social History   Tobacco Use  . Smoking status: Former Smoker    Packs/day: 1.50    Years: 15.00    Pack years: 22.50    Types: Cigarettes    Quit date: 11/09/1981    Years since quitting: 39.3  . Smokeless tobacco: Never Used  . Tobacco comment: no plans to start  Vaping Use  . Vaping Use: Never used  Substance Use Topics  . Alcohol use: No  . Drug use: No    Home Medications Prior to Admission medications   Medication Sig Start Date End Date Taking? Authorizing Provider  HYDROmorphone (DILAUDID) 4 MG tablet Take 1 tablet (4 mg total) by mouth every 6 (six) hours as needed for severe pain. 03/15/21  Yes [name] Ferguson, MD  levofloxacin (LEVAQUIN) 500 MG tablet Take 1 tablet (500 mg total) by mouth daily. 03/15/21  Yes [name] Ferguson, MD  acetaminophen (TYLENOL) 500 MG tablet Take 500 mg by mouth every 6 (six) hours as needed for headache (pain).    [provider]  CALCIUM CARBONATE ANTACID PO Take 1 tablet by mouth at bedtime as needed for indigestion or heartburn.     [provider]  diltiazem (CARDIZEM CD) 240 MG 24 hr capsule TAKE 1 CAPSULE BY MOUTH EVERY DAY 01/30/21   Minus Breeding, MD  ELIQUIS 5 MG TABS tablet TAKE 1 TABLET(5 MG) BY MOUTH TWICE DAILY 12/22/20   Minus Breeding, MD  levothyroxine (SYNTHROID) 100 MCG tablet TAKE 1 TABLET(100 MCG) BY MOUTH DAILY 01/30/21   Benay Pike, MD  Lidocaine (HM LIDOCAINE PATCH) 4 % PTCH Apply once per day as needed to lower back. 02/02/21   Benay Pike, MD  lisinopril (ZESTRIL) 2.5 MG tablet TAKE 1 TABLET(2.5 MG) BY MOUTH DAILY 01/30/21   Benay Pike, MD  loratadine (CLARITIN) 10 MG tablet Take 10 mg by mouth daily.    [provider]  metFORMIN (GLUCOPHAGE-XR) 500 MG 24 hr tablet TAKE 1 TABLET(500 MG) BY MOUTH TWICE DAILY 11/18/20   Benay Pike, MD  methocarbamol (ROBAXIN) 500 MG tablet Take 1 tablet (500 mg total) by mouth 3 (three) times daily. 02/23/21   Jaynee Eagles,  PA-C  ONETOUCH DELICA LANCETS FINE MISC TEST TWICE DAILY( 8AM AND 10PM) Patient taking differently: 2 (two) times daily. 10/03/17   Glenis Smoker, MD  oxyCODONE (OXY IR/ROXICODONE) 5 MG immediate release tablet Take 0.5-1 tablets (2.5-5 mg total) by mouth every 6 (six) hours as needed for severe pain. 03/09/21   Benay Pike, MD  Polyethyl Glycol-Propyl Glycol (SYSTANE) 0.4-0.3 % SOLN Place 1 drop into both eyes daily as needed (dry eyes).    [provider]  predniSONE (DELTASONE) 20 MG tablet Take 2 tablets  daily with breakfast. 02/23/21   Jaynee Eagles, PA-C  rosuvastatin (CRESTOR) 40 MG tablet TAKE 1 TABLET(40 MG) BY MOUTH DAILY 12/29/20   Minus Breeding, MD  senna (SENOKOT) 8.6 MG TABS tablet Take 1 tablet (8.6 mg total) by mouth daily as needed for mild constipation. 11/13/20   Benay Pike, MD  diltiazem (DILACOR XR) 120 MG 24 hr capsule Take 1 capsule (120 mg total) by mouth daily. 09/15/11 05/15/12  Minus Breeding, MD    Allergies    Penicillins and Percocet [oxycodone-acetaminophen]  Review of Systems   Review of Systems  Constitutional: Negative for appetite change and fatigue.  HENT: Negative for congestion, ear discharge and sinus pressure.   Eyes: Negative for discharge.  Respiratory: Negative for cough.   Cardiovascular: Negative for chest pain.  Gastrointestinal: Negative for abdominal pain and diarrhea.  Genitourinary: Negative for frequency and hematuria.  Musculoskeletal: Positive for back pain.  Skin: Negative for rash.  Neurological: Negative for seizures and headaches.  Psychiatric/Behavioral: Negative for hallucinations.    Physical Exam Updated Vital Signs BP 122/73 (BP Location: Left Arm)   Pulse 86   Temp 98.1 F (36.7 C) (Oral)   Resp 18   LMP  (LMP Unknown)   SpO2 94%   Physical Exam Vitals reviewed.  Constitutional:      Appearance: She is well-developed.  HENT:     Head: Normocephalic.     Nose: Nose normal.  Eyes:      General: No scleral icterus.    Conjunctiva/sclera: Conjunctivae normal.  Neck:     Thyroid: No thyromegaly.  Cardiovascular:     Rate and Rhythm: Normal rate and regular rhythm.     Heart sounds: No murmur heard. No friction rub. No gallop.   Pulmonary:     Breath sounds: No stridor. No wheezing or rales.  Chest:     Chest wall: No tenderness.  Abdominal:     General: There is no distension.     Tenderness: There is no abdominal tenderness. There is no rebound.  Genitourinary:    Comments: Tender left flank Musculoskeletal:        General: Normal range of motion.     Cervical back: Neck supple.  Lymphadenopathy:     Cervical: No cervical adenopathy.  Skin:    Findings: No erythema or rash.  Neurological:     Mental Status: She is oriented to person, place, and time.     Motor: No abnormal muscle tone.     Coordination: Coordination normal.  Psychiatric:        Behavior: Behavior normal.     ED Results / Procedures / Treatments   Labs (all labs ordered are listed, but only abnormal results are displayed) Labs Reviewed  CBC WITH DIFFERENTIAL/PLATELET - Abnormal; Notable for the following components:      Result Value   WBC 11.2 (*)    Hemoglobin 11.2 (*)    HCT 35.1 (*)    Neutro Abs 7.8 (*)    Monocytes Absolute 1.1 (*)    All other components within normal limits  COMPREHENSIVE METABOLIC PANEL - Abnormal; Notable for the following components:   Glucose, Bld 131 (*)    Creatinine, Ser 1.04 (*)    Albumin 3.4 (*)    GFR, Estimated 57 (*)    All other components within normal limits  URINALYSIS, ROUTINE W REFLEX MICROSCOPIC - Abnormal; Notable for the following components:   Specific Gravity, Urine 1.034 (*)    Hgb urine dipstick  SMALL (*)    Protein, ur 100 (*)    Leukocytes,Ua TRACE (*)    All other components within normal limits  URINE CULTURE  D-DIMER, QUANTITATIVE    EKG None  Radiology CT Chest W Contrast  Result Date: 03/15/2021 CLINICAL DATA:   Chest pain and shortnessof breath EXAM: CT CHEST WITH CONTRAST TECHNIQUE: Multidetector CT imaging of the chest was performed during intravenous contrast administration. CONTRAST:  25mL OMNIPAQUE IOHEXOL 300 MG/ML  SOLN COMPARISON:  None. FINDINGS: Cardiovascular: Scattered aortic atherosclerosis is noted. There is coronary artery calcifications present. The heart size is normal. There is no pericardial thickening or effusion. Mediastinum/Nodes: There are no enlarged mediastinal, hilar or axillary lymph nodes. The thyroid gland, trachea and esophagus demonstrate no significant findings. Lungs/Pleura: Rounded area of airspace consolidation seen within the anterior right upper lobe measuring approximately 2.8 x 2.4 cm. There is area of bronchial wall thickening seen adjacent to region. There is centrilobular emphysematous changes present. Right upper lobe thickening is also seen. Hazy ground-glass opacity seen at both lung bases. No pleural effusion is present. Upper abdomen: The visualized portion of the upper abdomen is unremarkable. Musculoskeletal/Chest wall: There is no chest wall mass or suspicious osseous finding. No acute osseous abnormality IMPRESSION: 1. Rounded spiculated masslike consolidation in the anterior right upper lobe which could represent a focal area of pneumonia, however cannot exclude underlying pulmonary neoplasm. Would recommend short interval follow-up in 3-4 weeks to determine resolution. 2.  Emphysema (ICD10-J43.9). 3.  Aortic Atherosclerosis (ICD10-I70.0). Electronically Signed   By: Prudencio Pair M.D.   On: 03/15/2021 21:11   MR LUMBAR SPINE WO CONTRAST  Result Date: 03/15/2021 CLINICAL DATA:  History of fall in November 2021. Persistent low back pain and bilateral leg weakness. EXAM: MRI LUMBAR SPINE WITHOUT CONTRAST TECHNIQUE: Multiplanar, multisequence MR imaging of the lumbar spine was performed. No intravenous contrast was administered. COMPARISON:  CT scan 03/15/2021 in lumbar  radiographs 02/23/2021 FINDINGS: Segmentation: There are five lumbar type vertebral bodies. The last full intervertebral disc space is labeled L5-S1. Alignment: Stable degenerative anterolisthesis of L4. The other lumbar vertebral bodies are normally aligned. Vertebrae: Marked edema like signal changes noted in the L4 vertebral body. This appears to be due to 2 sizable adjacent Schmorl's nodes involving the superior endplate with associated surrounding reactive marrow edema. I do not see any evidence of a compression fracture. The other vertebral bodies are unremarkable. Conus medullaris and cauda equina: Conus extends to the top of L1 level. Conus and cauda equina appear normal. Paraspinal and other soft tissues: No significant paraspinal or retroperitoneal findings. Disc levels: T12-L1: No significant findings. L1-2: Mild disc desiccation and slight bulging annulus with mild flattening of the ventral thecal sac and mild lateral recess encroachment. No significant spinal or foraminal stenosis. L2-3: Bulging degenerated annulus with mild mass effect on the ventral thecal sac and mild bilateral lateral recess stenosis. There is also mild right foraminal narrowing. No significant spinal stenosis. L3-4: Bulging annulus, mild facet disease and slight ligamentum flavum thickening contributing to early spinal stenosis and mild bilateral lateral recess stenosis. Right foraminal disc osteophyte complex appears to contact and slightly displace the right L3 nerve root. L4-5: Bulging uncovered disc in combination with advanced facet disease and ligamentum flavum thickening contributing to mild to moderate spinal stenosis and moderate bilateral lateral recess stenosis. No significant foraminal stenosis. L5-S1: No significant findings. IMPRESSION: 1. Marked edema like signal changes in the L4 vertebral body likely related to a 2 sizable adjacent Schmorl's  nodes involving the superior endplate with associated reactive marrow  edema. No evidence of a compression fracture. 2. Multilevel disc disease and facet disease. The most significant level appears to be L4-5 where there is mild to moderate spinal stenosis and moderate bilateral lateral recess stenosis. 3. Right foraminal disc osteophyte complex at L3-4 appears to contact and slightly displace the right L3 nerve root. Electronically Signed   By: Marijo Sanes M.D.   On: 03/15/2021 21:46   DG Chest Port 1 View  Result Date: 03/15/2021 CLINICAL DATA:  Upper back pain EXAM: PORTABLE CHEST 1 VIEW COMPARISON:  02/02/2017 FINDINGS: Cardiomegaly. Somewhat ovoid airspace opacity noted peripherally in the right upper lobe. This could reflect area of pneumonia although mass cannot be completely excluded. Left lung clear. No effusions or acute bony abnormality. IMPRESSION: Ovoid masslike density peripherally in the right upper lobe could reflect pulmonary nodule/mass or pneumonia. Consider further evaluation with chest CT with IV contrast or followup PA and lateral chest X-ray in 3-4 weeks following trial of antibiotic therapy to ensure resolution and exclude underlying malignancy. Electronically Signed   By: Rolm Baptise M.D.   On: 03/15/2021 18:49   CT Renal Stone Study  Result Date: 03/15/2021 CLINICAL DATA:  Left flank pain EXAM: CT ABDOMEN AND PELVIS WITHOUT CONTRAST TECHNIQUE: Multidetector CT imaging of the abdomen and pelvis was performed following the standard protocol without IV contrast. COMPARISON:  11/06/2015 FINDINGS: Lower chest: Coronary artery disease. Aortic atherosclerosis. Mild cardiomegaly. Scarring in the lung bases. Hepatobiliary: No focal hepatic abnormality. Gallbladder unremarkable. Pancreas: No focal abnormality or ductal dilatation. Spleen: No focal abnormality.  Normal size. Adrenals/Urinary Tract: No adrenal abnormality. No focal renal abnormality. No stones or hydronephrosis. Urinary bladder is unremarkable. Stomach/Bowel: Moderate stool burden throughout  the colon. Stomach, large and small bowel grossly unremarkable. Vascular/Lymphatic: Heavily calcified aorta and iliac vessels. No evidence of aneurysm or adenopathy. Reproductive: Uterus and adnexa unremarkable.  No mass. Other: No free fluid or free air. Musculoskeletal: Left hip replacement.  No acute bony abnormality. IMPRESSION: No renal or ureteral stones.  No hydronephrosis. Cardiomegaly, coronary artery disease, aortic atherosclerosis. Moderate stool burden throughout the colon. Electronically Signed   By: Rolm Baptise M.D.   On: 03/15/2021 19:32    Procedures Procedures   Medications Ordered in ED Medications  levofloxacin (LEVAQUIN) tablet 500 mg (has no administration in time range)  HYDROmorphone (DILAUDID) injection 1 mg (1 mg Intravenous Given 03/15/21 1901)  ondansetron (ZOFRAN) injection 4 mg (4 mg Intravenous Given 03/15/21 1901)  iohexol (OMNIPAQUE) 300 MG/ML solution 75 mL (75 mLs Intravenous Contrast Given 03/15/21 2051)    ED Course  I have reviewed the triage vital signs and the nursing notes.  Pertinent labs & imaging results that were available during my care of the patient were reviewed by me and considered in my medical decision making (see chart for details). Patient with urinary tract infection and also a lung mass that could be infection or tumor.  She will be started on some Levaquin and given Dilaudid for pain.  She will follow up with her PCP   MDM Rules/Calculators/A&P                          Patient with UTI and either lung infection or mass.  She is treated with Levaquin Final Clinical Impression(s) / ED Diagnoses Final diagnoses:  Acute cystitis without hematuria  Lung mass    Rx / DC Orders ED Discharge Orders  Ordered    levofloxacin (LEVAQUIN) 500 MG tablet  Daily        03/15/21 2203    HYDROmorphone (DILAUDID) 4 MG tablet  Every 6 hours PRN        03/15/21 2203           [name] Ferguson, MD 03/15/21 2206

## 2021-03-15 NOTE — Discharge Instructions (Addendum)
Follow-up with your primary care doctor this week.  They can follow-up on your urine culture and also follow-up on the results of your CT scan of your chest.  There is a questionable infection that they need to follow-up on

## 2021-03-15 NOTE — ED Triage Notes (Signed)
Patient reports back pain since 2/7. Reports pain worsens with movement.

## 2021-03-17 LAB — URINE CULTURE

## 2021-03-18 ENCOUNTER — Emergency Department (HOSPITAL_COMMUNITY)
Admission: EM | Admit: 2021-03-18 | Discharge: 2021-03-19 | Disposition: A | Discharge status: Home / Self Care | Payer: Medicare Other | Attending: Emergency Medicine | Admitting: Emergency Medicine

## 2021-03-18 ENCOUNTER — Encounter (HOSPITAL_COMMUNITY): Payer: Self-pay

## 2021-03-18 ENCOUNTER — Other Ambulatory Visit: Payer: Self-pay

## 2021-03-18 DIAGNOSIS — Z7984 Long term (current) use of oral hypoglycemic drugs: Secondary | ICD-10-CM | POA: Diagnosis not present

## 2021-03-18 DIAGNOSIS — M545 Low back pain, unspecified: Secondary | ICD-10-CM | POA: Insufficient documentation

## 2021-03-18 DIAGNOSIS — N183 Chronic kidney disease, stage 3 unspecified: Secondary | ICD-10-CM | POA: Insufficient documentation

## 2021-03-18 DIAGNOSIS — Z7901 Long term (current) use of anticoagulants: Secondary | ICD-10-CM | POA: Insufficient documentation

## 2021-03-18 DIAGNOSIS — Z96642 Presence of left artificial hip joint: Secondary | ICD-10-CM | POA: Insufficient documentation

## 2021-03-18 DIAGNOSIS — G8929 Other chronic pain: Secondary | ICD-10-CM | POA: Insufficient documentation

## 2021-03-18 DIAGNOSIS — E039 Hypothyroidism, unspecified: Secondary | ICD-10-CM | POA: Diagnosis not present

## 2021-03-18 DIAGNOSIS — I2510 Atherosclerotic heart disease of native coronary artery without angina pectoris: Secondary | ICD-10-CM | POA: Diagnosis not present

## 2021-03-18 DIAGNOSIS — I129 Hypertensive chronic kidney disease with stage 1 through stage 4 chronic kidney disease, or unspecified chronic kidney disease: Secondary | ICD-10-CM | POA: Insufficient documentation

## 2021-03-18 DIAGNOSIS — E1122 Type 2 diabetes mellitus with diabetic chronic kidney disease: Secondary | ICD-10-CM | POA: Diagnosis not present

## 2021-03-18 DIAGNOSIS — Z87891 Personal history of nicotine dependence: Secondary | ICD-10-CM | POA: Insufficient documentation

## 2021-03-18 DIAGNOSIS — Z8551 Personal history of malignant neoplasm of bladder: Secondary | ICD-10-CM | POA: Diagnosis not present

## 2021-03-18 DIAGNOSIS — Z79899 Other long term (current) drug therapy: Secondary | ICD-10-CM | POA: Diagnosis not present

## 2021-03-18 DIAGNOSIS — M5459 Other low back pain: Secondary | ICD-10-CM | POA: Diagnosis not present

## 2021-03-18 LAB — CBC WITH DIFFERENTIAL/PLATELET
Abs Immature Granulocytes: 0.1 10*3/uL — ABNORMAL HIGH (ref 0.00–0.07)
Basophils Absolute: 0 10*3/uL (ref 0.0–0.1)
Basophils Relative: 0 %
Eosinophils Absolute: 0.1 10*3/uL (ref 0.0–0.5)
Eosinophils Relative: 1 %
HCT: 35.3 % — ABNORMAL LOW (ref 36.0–46.0)
Hemoglobin: 11.2 g/dL — ABNORMAL LOW (ref 12.0–15.0)
Immature Granulocytes: 1 %
Lymphocytes Relative: 18 %
Lymphs Abs: 2.1 10*3/uL (ref 0.7–4.0)
MCH: 27.9 pg (ref 26.0–34.0)
MCHC: 31.7 g/dL (ref 30.0–36.0)
MCV: 87.8 fL (ref 80.0–100.0)
Monocytes Absolute: 1.2 10*3/uL — ABNORMAL HIGH (ref 0.1–1.0)
Monocytes Relative: 11 %
Neutro Abs: 7.7 10*3/uL (ref 1.7–7.7)
Neutrophils Relative %: 69 %
Platelets: 329 10*3/uL (ref 150–400)
RBC: 4.02 MIL/uL (ref 3.87–5.11)
RDW: 14.6 % (ref 11.5–15.5)
WBC: 11.2 10*3/uL — ABNORMAL HIGH (ref 4.0–10.5)
nRBC: 0 % (ref 0.0–0.2)

## 2021-03-18 LAB — URINALYSIS, ROUTINE W REFLEX MICROSCOPIC
Glucose, UA: NEGATIVE mg/dL
Ketones, ur: NEGATIVE mg/dL
Leukocytes,Ua: NEGATIVE
Nitrite: NEGATIVE
Protein, ur: 100 mg/dL — AB
Specific Gravity, Urine: >1.03 — ABNORMAL HIGH (ref 1.005–1.030)
pH: 5.5 (ref 5.0–8.0)

## 2021-03-18 LAB — URINALYSIS, MICROSCOPIC (REFLEX): WBC, UA: NONE SEEN WBC/hpf (ref 0–5)

## 2021-03-18 LAB — BASIC METABOLIC PANEL
Anion gap: 11 (ref 5–15)
BUN: 15 mg/dL (ref 8–23)
CO2: 24 mmol/L (ref 22–32)
Calcium: 9.5 mg/dL (ref 8.9–10.3)
Chloride: 99 mmol/L (ref 98–111)
Creatinine, Ser: 1.14 mg/dL — ABNORMAL HIGH (ref 0.44–1.00)
GFR, Estimated: 51 mL/min — ABNORMAL LOW (ref >60)
Glucose, Bld: 104 mg/dL — ABNORMAL HIGH (ref 70–99)
Potassium: 3.7 mmol/L (ref 3.5–5.1)
Sodium: 134 mmol/L — ABNORMAL LOW (ref 135–145)

## 2021-03-18 MED ORDER — METHYLPREDNISOLONE 4 MG PO TBPK: ORAL_TABLET | ORAL | 0 refills | Status: DC

## 2021-03-18 MED ORDER — DEXAMETHASONE SODIUM PHOSPHATE 10 MG/ML IJ SOLN
10.0000 mg | Freq: Once | INTRAMUSCULAR | Status: AC
  Administered 2021-03-18: 10 mg via INTRAVENOUS
  Filled 2021-03-18: qty 1

## 2021-03-18 MED ORDER — HYDROMORPHONE HCL 1 MG/ML IJ SOLN
1.0000 mg | Freq: Once | INTRAMUSCULAR | Status: AC
  Administered 2021-03-18: 1 mg via INTRAVENOUS
  Filled 2021-03-18: qty 1

## 2021-03-18 NOTE — ED Notes (Signed)
Pt assisted to patient restroom with x1 assist, pt ambulatory, co of back pain increasing with ambulation.

## 2021-03-18 NOTE — ED Triage Notes (Signed)
Patient complains of ongoing lower back and thoracic back pain since February. Patient has had MRI for same and has appointment to see Dr. Trenton Gammon on 3/30

## 2021-03-18 NOTE — ED Provider Notes (Signed)
[facility] EMERGENCY DEPARTMENT Provider Note   CSN: [id number] Arrival date & time: 03/18/21  1510     History No chief complaint on file.   [name] is a 70 y.o. female.  Pt presents to the ED today with back pain.  Pt had a MRI of her back on 3/19 which showed:   IMPRESSION: 1. Marked edema like signal changes in the L4 vertebral body likely related to a 2 sizable adjacent Schmorl's nodes involving the superior endplate with associated reactive marrow edema. No evidence of a compression fracture. 2. Multilevel disc disease and facet disease. The most significant level appears to be L4-5 where there is mild to moderate spinal stenosis and moderate bilateral lateral recess stenosis. 3. Right foraminal disc osteophyte complex at L3-4 appears to contact and slightly displace the right L3 nerve root.  She was seen in the ED on 3/20 for severe pain and was sent home with oral dilaudid because the oxycodone she'd been taking has not been helping.  She said it is worse at night and she's not been able to sleep.  She has an appt with Dr. Annette Stable on 3/30, but her pain is so bad that she came back.  She denies any urinary retention.  She's been constipated since she's been on the pain medicine.  She is able to walk, but it hurts to walk.  No pain radiating down her leg.  She has pain only in her back.        Past Medical History:  Diagnosis Date  . Arthritis    wrist, hands -  . Atrial fibrillation, persistent ([place])   . Bladder tumor   . CAD (coronary artery disease) cardiologist-  dr hochrien   PTCA RCA 1999  . Cataract   . Diabetes ([place])   . Full dentures   . GERD (gastroesophageal reflux disease)   . History of colon polyps    2005  hyperplastic  . History of MI (myocardial infarction)    1999  . HLD (hyperlipidemia)   . Hypertension   . Hypothyroidism   . SUI (stress urinary incontinence, female)     Patient Active Problem List    Diagnosis Date Noted  . Chronic midline low back pain without sciatica 02/03/2021  . AKI (acute kidney injury) ([place]) 11/16/2020  . Closed trimalleolar fracture of left ankle 11/09/2020  . Elbow pain, left 08/31/2020  . Conductive hearing loss, bilateral 07/25/2020  . Peripheral neuropathy 06/15/2020  . Chronic kidney disease (CKD), stage III (moderate) ([place]) 12/06/2019  . Dyslipidemia 11/25/2019  . Osteoarthritis of right knee 07/11/2019  . Microalbuminuria 12/07/2018  . OSA (obstructive sleep apnea) 03/16/2018  . History of bladder cancer 08/24/2017  . Claudication ([place]) 07/21/2017  . Onychomycosis of toenail 06/09/2017  . Long-term (current) use of anticoagulants 02/19/2015  . Diabetes ([place]) 04/11/2014  . Atrial fibrillation ([place]) 12/18/2013  . Urinary incontinence 05/06/2011  . HLD (hyperlipidemia) 04/29/2009  . Obesity 04/29/2009  . Essential hypertension, benign 04/29/2009  . Coronary atherosclerosis 04/29/2009  . Hypothyroidism 02/23/2007  . MYOCARDIAL INFARCTION, OLD 02/23/2007  . Personal history of colonic adenoma 11/11/2004    Past Surgical History:  Procedure Laterality Date  . CARDIOVASCULAR STRESS TEST  05-01-2009   dr hochrein   normal nuclear study/  no ischemia or scar/  normal LV function and wall motion, ef 71%  . COLONOSCOPY     multiple  . CORONARY ANGIOPLASTY  Feb 1999   PTCA to RCA  .  D & C HYSTEROSCOPY/  POLYPECTOMY  05-08-2009  . HYSTEROSCOPY WITH D & C  12/07/2012   Procedure: DILATATION AND CURETTAGE /HYSTEROSCOPY;  Surgeon: Elveria Royals, MD;  Location: [facility];  Service: Gynecology;  Laterality: N/A;  Dilitation and curettage /hysteroscopy/biopsey of lower segment of uterus  . ORIF ANKLE FRACTURE Left 11/10/2020   Procedure: OPEN REDUCTION INTERNAL FIXATION (ORIF) ANKLE FRACTURE;  Surgeon: Shona Needles, MD;  Location: [facility];  Service: Orthopedics;  Laterality: Left;  . TOTAL HIP ARTHROPLASTY Left 12-11-2010  . TRANSTHORACIC ECHOCARDIOGRAM   09-11-2015   mild LVH, ef 55%,  mildly reduced RVSF,  trivial TR  . TRANSURETHRAL RESECTION OF BLADDER TUMOR N/A 12/01/2015   Procedure: TRANSURETHRAL RESECTION OF BLADDER TUMOR (TURBT);  Surgeon: Kathie Rhodes, MD;  Location: [facility];  Service: Urology;  Laterality: N/A;  . TUBAL LIGATION  1974     OB History   No obstetric history on file.     Family History  Problem Relation Age of Onset  . Diabetes Mother   . Heart disease Mother   . Colon cancer Mother 51  . Heart disease Father   . Cancer Sister        Lung  . Cancer Brother        intestinal  . Atrial fibrillation Brother   . Cancer Brother        throat ca  . Cancer Brother        bladder  . Breast cancer Neg Hx     Social History   Tobacco Use  . Smoking status: Former Smoker    Packs/day: 1.50    Years: 15.00    Pack years: 22.50    Types: Cigarettes    Quit date: 11/09/1981    Years since quitting: 39.3  . Smokeless tobacco: Never Used  . Tobacco comment: no plans to start  Vaping Use  . Vaping Use: Never used  Substance Use Topics  . Alcohol use: No  . Drug use: No    Home Medications Prior to Admission medications   Medication Sig Start Date End Date Taking? Authorizing Provider  acetaminophen (TYLENOL) 500 MG tablet Take 500 mg by mouth every 6 (six) hours as needed for headache (pain).   Yes [provider]  diltiazem (CARDIZEM CD) 240 MG 24 hr capsule TAKE 1 CAPSULE BY MOUTH EVERY DAY Patient taking differently: Take 240 mg by mouth at bedtime. 01/30/21  Yes Hochrein, Jeneen Rinks, MD  ELIQUIS 5 MG TABS tablet TAKE 1 TABLET(5 MG) BY MOUTH TWICE DAILY Patient taking differently: Take 5 mg by mouth in the morning and at bedtime. 12/22/20  Yes Minus Breeding, MD  HYDROmorphone (DILAUDID) 4 MG tablet Take 1 tablet (4 mg total) by mouth every 6 (six) hours as needed for severe pain. 03/15/21  Yes [name] Ferguson, MD  levofloxacin (LEVAQUIN) 500 MG tablet Take 1 tablet (500 mg total)  by mouth daily. 03/15/21  Yes [name] Ferguson, MD  levothyroxine (SYNTHROID) 100 MCG tablet TAKE 1 TABLET(100 MCG) BY MOUTH DAILY Patient taking differently: Take 100 mcg by mouth daily before breakfast. 01/30/21  Yes Benay Pike, MD  lisinopril (ZESTRIL) 2.5 MG tablet TAKE 1 TABLET(2.5 MG) BY MOUTH DAILY Patient taking differently: Take 2.5 mg by mouth at bedtime. 01/30/21  Yes Benay Pike, MD  loratadine (CLARITIN) 10 MG tablet Take 10 mg by mouth daily.   Yes [provider]  metFORMIN (GLUCOPHAGE-XR) 500 MG 24 hr tablet TAKE 1 TABLET(500  MG) BY MOUTH TWICE DAILY Patient taking differently: Take 500 mg by mouth in the morning and at bedtime. 11/18/20  Yes Benay Pike, MD  methocarbamol (ROBAXIN) 500 MG tablet Take 1 tablet (500 mg total) by mouth 3 (three) times daily. Patient taking differently: Take 500 mg by mouth at bedtime as needed for muscle spasms. 02/23/21  Yes Jaynee Eagles, PA-C  methylPREDNISolone (MEDROL DOSEPAK) 4 MG TBPK tablet Day 1: 24 mg on day 1 administered as 8 mg (2 tablets) before breakfast, 4 mg (1 tablet) after lunch, 4 mg (1 tablet) after supper, and 8 mg (2 tablets) at bedtime or 24 mg (6 tablets) as a single dose or divided into 2 or 3 doses upon initiation (regardless of time of day). Day 2: 20 mg on day 2 administered as 4 mg (1 tablet) before breakfast, 4 mg (1 tablet) after lunch, 4 mg (1 tablet) after supper, and 8 mg (2 tablets) at bedtime. Day 3: 16 mg on day 3 administered as 4 mg (1 tablet) before breakfast, 4 mg (1 tablet) after lunch, 4 mg (1 tablet) after supper, and 4 mg (1 tablet) at bedtime. Day 4: 12 mg on day 4 administered as 4 mg (1 tablet) before breakfast, 4 mg (1 tablet) after lunch, and 4 mg (1 tablet) at bedtime. Day 5: 8 mg on day 5 administered as 4 mg (1 tablet) before breakfast and 4 mg (1 tablet) at bedtime. Day 6: 4 mg on day 6 administered as 4 mg (1 tablet) before breakfast. 03/18/21  Yes Isla Pence, MD  oxyCODONE (OXY  IR/ROXICODONE) 5 MG immediate release tablet Take 0.5-1 tablets (2.5-5 mg total) by mouth every 6 (six) hours as needed for severe pain. 03/09/21  Yes Benay Pike, MD  PEPCID [facility] MAXIMUM STRENGTH 20 MG tablet Take 20 mg by mouth daily as needed for heartburn or indigestion.   Yes [provider]  Polyethyl Glycol-Propyl Glycol (SYSTANE) 0.4-0.3 % SOLN Place 1 drop into both eyes 3 (three) times daily as needed (for dryness).   Yes [provider]  rosuvastatin (CRESTOR) 40 MG tablet TAKE 1 TABLET(40 MG) BY MOUTH DAILY Patient taking differently: Take 40 mg by mouth at bedtime. 12/29/20  Yes Minus Breeding, MD  senna (SENOKOT) 8.6 MG TABS tablet Take 1 tablet (8.6 mg total) by mouth daily as needed for mild constipation. 11/13/20  Yes Benay Pike, MD  Lidocaine (HM LIDOCAINE PATCH) 4 % PTCH Apply once per day as needed to lower back. Patient not taking: No sig reported 02/02/21   Benay Pike, MD  [facility] DELICA LANCETS FINE MISC TEST TWICE DAILY( 8AM AND 10PM) Patient taking differently: 2 (two) times daily. 10/03/17   Glenis Smoker, MD  diltiazem (DILACOR XR) 120 MG 24 hr capsule Take 1 capsule (120 mg total) by mouth daily. 09/15/11 05/15/12  Minus Breeding, MD    Allergies    Penicillins and Percocet [oxycodone-acetaminophen]  Review of Systems   Review of Systems  Musculoskeletal: Positive for back pain.  All other systems reviewed and are negative.   Physical Exam Updated Vital Signs BP 109/67   Pulse 90   Temp 98 F (36.7 C)   Resp 12   LMP  (LMP Unknown)   SpO2 96%   Physical Exam Vitals and nursing note reviewed.  Constitutional:      Appearance: Normal appearance.  HENT:     Head: Normocephalic and atraumatic.     Right Ear: External ear normal.  Left Ear: External ear normal.     Nose: Nose normal.     Mouth/Throat:     Mouth: Mucous membranes are moist.     Pharynx: Oropharynx is clear.  Eyes:     Extraocular Movements:  Extraocular movements intact.     Conjunctiva/sclera: Conjunctivae normal.     Pupils: Pupils are equal, round, and reactive to light.  Cardiovascular:     Rate and Rhythm: Normal rate and regular rhythm.     Pulses: Normal pulses.     Heart sounds: Normal heart sounds.  Pulmonary:     Effort: Pulmonary effort is normal.     Breath sounds: Normal breath sounds.  Abdominal:     General: Abdomen is flat. Bowel sounds are normal.     Palpations: Abdomen is soft.  Musculoskeletal:     Cervical back: Normal range of motion and neck supple.       Back:  Skin:    General: Skin is warm.     Capillary Refill: Capillary refill takes less than 2 seconds.  Neurological:     General: No focal deficit present.     Mental Status: She is alert and oriented to person, place, and time.  Psychiatric:        Mood and Affect: Mood normal.        Behavior: Behavior normal.        Thought Content: Thought content normal.        Judgment: Judgment normal.     ED Results / Procedures / Treatments   Labs (all labs ordered are listed, but only abnormal results are displayed) Labs Reviewed  BASIC METABOLIC PANEL - Abnormal; Notable for the following components:      Result Value   Sodium 134 (*)    Glucose, Bld 104 (*)    Creatinine, Ser 1.14 (*)    GFR, Estimated 51 (*)    All other components within normal limits  CBC WITH DIFFERENTIAL/PLATELET - Abnormal; Notable for the following components:   WBC 11.2 (*)    Hemoglobin 11.2 (*)    HCT 35.3 (*)    Monocytes Absolute 1.2 (*)    Abs Immature Granulocytes 0.10 (*)    All other components within normal limits  URINALYSIS, ROUTINE W REFLEX MICROSCOPIC - Abnormal; Notable for the following components:   Specific Gravity, Urine >1.030 (*)    Hgb urine dipstick MODERATE (*)    Bilirubin Urine SMALL (*)    Protein, ur 100 (*)    All other components within normal limits  URINALYSIS, MICROSCOPIC (REFLEX) - Abnormal; Notable for the following  components:   Bacteria, UA RARE (*)    All other components within normal limits    EKG None  Radiology No results found.  Procedures Procedures   Medications Ordered in ED Medications  HYDROmorphone (DILAUDID) injection 1 mg (1 mg Intravenous Given 03/18/21 1808)  HYDROmorphone (DILAUDID) injection 1 mg (1 mg Intravenous Given 03/18/21 1935)  dexamethasone (DECADRON) injection 10 mg (10 mg Intravenous Given 03/18/21 1935)    ED Course  I have reviewed the triage vital signs and the nursing notes.  Pertinent labs & imaging results that were available during my care of the patient were reviewed by me and considered in my medical decision making (see chart for details).    MDM Rules/Calculators/A&P                          Pt d/w NS.  They recommend adding decadron IV here and a medrol dose pack.  Pt was on steroids about a month ago, but none now.   Pt also has Meloxicam at home which she can take.  Pt said she's tried lidocaine patches and voltaren cream without improvement.  Pt is to f/u with Dr. Annette Stable as scheduled.  Final Clinical Impression(s) / ED Diagnoses Final diagnoses:  Chronic bilateral low back pain without sciatica    Rx / DC Orders ED Discharge Orders         Ordered    methylPREDNISolone (MEDROL DOSEPAK) 4 MG TBPK tablet        03/18/21 1945           Isla Pence, MD 03/18/21 2322

## 2021-03-18 NOTE — Discharge Instructions (Signed)
Take the Robaxin (methocarbamol) as well to help with the pain.

## 2021-03-18 NOTE — ED Notes (Signed)
Pt unable to provide specimen due to pt having bladder spasms, pt placed on purewick at this time.

## 2021-03-19 ENCOUNTER — Telehealth: Payer: Self-pay | Admitting: *Deleted

## 2021-03-19 ENCOUNTER — Ambulatory Visit (INDEPENDENT_AMBULATORY_CARE_PROVIDER_SITE_OTHER): Payer: Medicare Other | Admitting: Family Medicine

## 2021-03-19 ENCOUNTER — Encounter: Payer: Self-pay | Admitting: Family Medicine

## 2021-03-19 VITALS — BP 94/52 | HR 83 | Ht 63.0 in | Wt 183.0 lb

## 2021-03-19 DIAGNOSIS — R918 Other nonspecific abnormal finding of lung field: Secondary | ICD-10-CM

## 2021-03-19 DIAGNOSIS — G8929 Other chronic pain: Secondary | ICD-10-CM | POA: Diagnosis not present

## 2021-03-19 DIAGNOSIS — M545 Low back pain, unspecified: Secondary | ICD-10-CM | POA: Diagnosis not present

## 2021-03-19 DIAGNOSIS — I10 Essential (primary) hypertension: Secondary | ICD-10-CM

## 2021-03-19 NOTE — Progress Notes (Signed)
    SUBJECTIVE:   CHIEF COMPLAINT / HPI:   Back pain: Patient has had worsening back pain.  She went to the ED twice in the last 3 days.  She was given oral Dilaudid at 1 of these visits.  Still has some of the tablets left.  She has an appointment with the neurosurgeon on 3/30 to discuss her back pain and recent MRI results.  She was also given a steroid Dosepak when she went to the ED yesterday.  This helped her get a little bit of sleep.  Pulmonary nodule: When the patient was in the ED she received a CT of her chest that showed a area of consolidation that may have been an infection or malignancy.  Patient denies any respiratory symptoms including cough, shortness of breath, chest pain.  She does not smoke currently and has not smoked in over 30 years.  She was given Levaquin at the ED for possible UTI and this possible lung infection.  She states they called her and told her she does not need to take the Levaquin anymore after her urine culture results came back.Marland Kitchen    PERTINENT  PMH / PSH: A. fib, HTN, back pain  OBJECTIVE:   BP (!) 94/52   Pulse 83   Ht 5\' 3"  (1.6 m)   Wt 183 lb (83 kg)   LMP  (LMP Unknown)   SpO2 97%   BMI 32.42 kg/m   General: Alert, oriented.  Mild discomfort. CV: Regular rate and rhythm, no murmurs Pulmonary: Lungs clear to auscultation bilaterally, no wheezes crackles.  ASSESSMENT/PLAN:   Lung mass Incidental lung mass finding in the right upper lobe that her recent ED visit.  The read was infection versus malignancy, but patient not having any symptoms of infection at this time.  We will follow up in 4 weeks with another CT.  This has been scheduled.  We will not do antibiotics due to lack of symptoms and normal lung exam findings..  Chronic midline low back pain without sciatica MRI showed tear level lumbar disc and facet disease.  Currently taking Tylenol, steroid Dosepak, and has oral Dilaudid and 2.5 mg oxycodone as needed.  The Dilaudid was prescribed  at recent ED visit, oxycodone prescribed by me prior to that.  She has leftovers of both as she has tried to avoid using them unless absolutely necessary.  Advised patient to schedule her Tylenol.  Cannot use NSAIDs due to anticoagulant use.  Advised her to use the oxycodone before the Dilaudid and not both within 6 hours of each other.  Essential hypertension, benign Patient is mildly hypotensive today with a BP of 94/52.  Repeat was similar.  Advised patient to stop her lisinopril and to continue checking blood pressure and record them and let us know if it becomes elevated over 140/90     Benay Pike, MD [place]

## 2021-03-19 NOTE — Telephone Encounter (Signed)
Dr. Jeannine Kitten ordered CT for this pt for 1 month, please call and schedule once PA is authorized if needed.[name], CMA

## 2021-03-19 NOTE — Patient Instructions (Addendum)
It was nice to see you today,  Since you are not having any symptoms of respiratory infection we do not need to treat anything with antibiotics.  We will schedule you your CT scan in 1 month from now.  We will call you once it is scheduled.  Please continue to take your pain medicines as needed.  You can take Tylenol 500 mg every 4-6 hours or 1000 mg every 8 hours.  Try scheduling the Tylenol so that you can use the opioid pain medication as needed.  Please stop taking your lisinopril.  Check your blood pressure at home and record them.  Bring them back to me so we can check on again in a few weeks.  Have a great day,  [name], MD

## 2021-03-19 NOTE — Telephone Encounter (Signed)
Tried to contact pt to be sure that she was aware of the appointment with [facility].  It looks like they called and scheduled that with the patient.Makynna Manocchio Zimmerman Rumple, CMA

## 2021-03-21 DIAGNOSIS — R918 Other nonspecific abnormal finding of lung field: Secondary | ICD-10-CM | POA: Insufficient documentation

## 2021-03-21 NOTE — Assessment & Plan Note (Addendum)
Incidental lung mass finding in the right upper lobe that her recent ED visit.  The read was infection versus malignancy, but patient not having any symptoms of infection at this time.  We will follow up in 4 weeks with another CT.  This has been scheduled.  We will not do antibiotics due to lack of symptoms and normal lung exam findings.Marland Kitchen

## 2021-03-21 NOTE — Assessment & Plan Note (Signed)
Patient is mildly hypotensive today with a BP of 94/52.  Repeat was similar.  Advised patient to stop her lisinopril and to continue checking blood pressure and record them and let us know if it becomes elevated over 140/90

## 2021-03-21 NOTE — Assessment & Plan Note (Signed)
MRI showed tear level lumbar disc and facet disease.  Currently taking Tylenol, steroid Dosepak, and has oral Dilaudid and 2.5 mg oxycodone as needed.  The Dilaudid was prescribed at recent ED visit, oxycodone prescribed by me prior to that.  She has leftovers of both as she has tried to avoid using them unless absolutely necessary.  Advised patient to schedule her Tylenol.  Cannot use NSAIDs due to anticoagulant use.  Advised her to use the oxycodone before the Dilaudid and not both within 6 hours of each other.

## 2021-03-25 ENCOUNTER — Telehealth: Payer: Self-pay | Admitting: *Deleted

## 2021-03-25 DIAGNOSIS — S32040A Wedge compression fracture of fourth lumbar vertebra, initial encounter for closed fracture: Secondary | ICD-10-CM | POA: Diagnosis not present

## 2021-03-25 NOTE — Telephone Encounter (Signed)
   [facility] Pre-operative Risk Assessment     Request for surgical clearance:  1. What type of surgery is being performed? L4 KYPHOPLASTY    2. When is this surgery scheduled? 04/03/21   3. What type of clearance is required (medical clearance vs. Pharmacy clearance to hold med vs. Both)? BOTH  4. Are there any medications that need to be held prior to surgery and how long?ELIQUIS   5. Practice name and name of physician performing surgery? DR Mallie Mussel [facility]   6. What is the office phone number? [phone]   7.   What is the office fax number? [place]  8.   Anesthesia type (None, local, MAC, general) ? GENERAL

## 2021-03-26 NOTE — Telephone Encounter (Signed)
   Primary Cardiologist: Minus Breeding, MD  Chart reviewed as part of pre-operative protocol coverage. Given past medical history and time since last visit, based on ACC/AHA guidelines, [name] would be at acceptable risk for the planned procedure without further cardiovascular testing.   Her RCRI is a class II risk 0.9% risk of major cardiac event.  Patient with diagnosis of afib on Eliquis for anticoagulation.    Procedure: L4 kyphoplasty Date of procedure: 04/03/21  CHA2DS2-VASc Score = 5  This indicates a 7.2% annual risk of stroke. The patient's score is based upon: CHF History: No HTN History: Yes Diabetes History: Yes Stroke History: No Vascular Disease History: Yes Age Score: 1 Gender Score: 1  CrCl 46 mL/min using adjusted body weight Platelet count 329K  Per office protocol, patient can hold Eliquis for 3 days prior to procedure.  I will route this recommendation to the requesting party via Epic fax function and remove from pre-op pool.  Please call with questions.  Jossie Ng. [name] NP-C    03/26/2021, [address] Office [phone] Fax [phone]

## 2021-03-26 NOTE — Telephone Encounter (Addendum)
Patient with diagnosis of afib on Eliquis for anticoagulation.    Procedure: L4 kyphoplasty Date of procedure: 04/03/21  CHA2DS2-VASc Score = 5  This indicates a 7.2% annual risk of stroke. The patient's score is based upon: CHF History: No HTN History: Yes Diabetes History: Yes Stroke History: No Vascular Disease History: Yes Age Score: 1 Gender Score: 1  CrCl 46 mL/min using adjusted body weight Platelet count 329K  Per office protocol, patient can hold Eliquis for 3 days prior to procedure.

## 2021-03-31 ENCOUNTER — Other Ambulatory Visit: Payer: Self-pay

## 2021-03-31 NOTE — Telephone Encounter (Signed)
Daughter calls nurse line from patients home requesting a refill on Oxycodone. Patients daughter reports she has back surgery scheduled for 4/8, however is out of pain medication. Daughter reports the patient has been crying in pain and not sleeping through the night. Daughter advised will send a refill request to PCP, however he is on night float. Daughter advised to take patient to an UC or ED if worsening uncontrolled pain continues. Will forward to PCP. We have no apts this week.

## 2021-04-01 MED ORDER — OXYCODONE HCL 5 MG PO TABS: 2.5000 mg | ORAL_TABLET | Freq: Four times a day (QID) | ORAL | 0 refills | Status: DC | PRN

## 2021-04-03 DIAGNOSIS — Y929 Unspecified place or not applicable: Secondary | ICD-10-CM | POA: Diagnosis not present

## 2021-04-03 DIAGNOSIS — S32040A Wedge compression fracture of fourth lumbar vertebra, initial encounter for closed fracture: Secondary | ICD-10-CM | POA: Diagnosis not present

## 2021-04-03 DIAGNOSIS — Y939 Activity, unspecified: Secondary | ICD-10-CM | POA: Diagnosis not present

## 2021-04-03 DIAGNOSIS — Y999 Unspecified external cause status: Secondary | ICD-10-CM | POA: Diagnosis not present

## 2021-04-06 ENCOUNTER — Other Ambulatory Visit: Payer: Medicare Other

## 2021-04-11 DIAGNOSIS — G4733 Obstructive sleep apnea (adult) (pediatric): Secondary | ICD-10-CM | POA: Diagnosis not present

## 2021-04-11 DIAGNOSIS — S82852A Displaced trimalleolar fracture of left lower leg, initial encounter for closed fracture: Secondary | ICD-10-CM | POA: Diagnosis not present

## 2021-04-11 DIAGNOSIS — G629 Polyneuropathy, unspecified: Secondary | ICD-10-CM | POA: Diagnosis not present

## 2021-04-11 DIAGNOSIS — M1711 Unilateral primary osteoarthritis, right knee: Secondary | ICD-10-CM | POA: Diagnosis not present

## 2021-04-11 DIAGNOSIS — R531 Weakness: Secondary | ICD-10-CM | POA: Diagnosis not present

## 2021-04-14 ENCOUNTER — Ambulatory Visit: Payer: Medicare Other | Admitting: Family Medicine

## 2021-04-14 NOTE — Progress Notes (Deleted)
    SUBJECTIVE:   CHIEF COMPLAINT / HPI:   HTN: should keep the lisinopril due to hx of MI and ckd 3.   Back pain:   Surgery: when?   PERTINENT  PMH / PSH: ***  OBJECTIVE:   LMP  (LMP Unknown)   ***  ASSESSMENT/PLAN:   No problem-specific Assessment & Plan notes found for this encounter.     Benay Pike, MD [place]   {    This will disappear when note is signed, click to select method of visit    :1}

## 2021-04-15 DIAGNOSIS — S32040A Wedge compression fracture of fourth lumbar vertebra, initial encounter for closed fracture: Secondary | ICD-10-CM | POA: Diagnosis not present

## 2021-04-20 ENCOUNTER — Inpatient Hospital Stay: Admission: RE | Admit: 2021-04-20 | Payer: Medicare Other | Source: Ambulatory Visit

## 2021-04-22 ENCOUNTER — Telehealth: Payer: Self-pay

## 2021-04-22 NOTE — Telephone Encounter (Signed)
Patients daughter calls nurse line reporting positive covid test today. Daughter states the only symptom she has is a cough. Daughter reported they thought it was just allergies until a family member tested positive. Daughter denies any fever, chills, or SOB. ED precautions given as well as CDC guidelines for quarantine. Patient is vaccinated. Will forward to PCP.

## 2021-04-23 ENCOUNTER — Other Ambulatory Visit (HOSPITAL_COMMUNITY): Payer: Self-pay

## 2021-04-23 ENCOUNTER — Other Ambulatory Visit: Payer: Self-pay | Admitting: Family Medicine

## 2021-04-23 MED ORDER — MOLNUPIRAVIR 200 MG PO CAPS
800.0000 mg | ORAL_CAPSULE | Freq: Two times a day (BID) | ORAL | 0 refills | Status: AC
  Filled 2021-04-23: qty 40, 5d supply, fill #0

## 2021-04-23 NOTE — Telephone Encounter (Signed)
Please let the patient know I have called in a medication for her that helps prevent progression to severe covid in patients. It is 4 pills taken twice a day for 5 days.  She will have to pick it up at the [facility].  It does not resolve her symptoms but is used to reduce the risk of worsening symptoms that could lead to hospitalization.

## 2021-04-23 NOTE — Progress Notes (Signed)
Pt on a DOAC that could lead to increased bleeding if she takes paxlovid.  Will treat with monupiravir.

## 2021-04-28 NOTE — Telephone Encounter (Signed)
I called patient to see how she is doing. Patient reports she finished the medication and does feel better. However, she reports some continued congestion. Patient would like to know some OTC options that will not interfere with any of her medications or if you could call something in. Please advise.

## 2021-04-29 NOTE — Telephone Encounter (Signed)
Called and left voicemail advising pt she can use otc cough meds that list guaifenesin as their active ingredient.  Recommended mucinex as an example.

## 2021-04-30 ENCOUNTER — Other Ambulatory Visit: Payer: Self-pay | Admitting: Cardiology

## 2021-04-30 NOTE — Telephone Encounter (Signed)
3f, 83kg, scr 1.14 03/18/21, lovw/hochrein 02/24/21

## 2021-05-02 ENCOUNTER — Emergency Department (HOSPITAL_COMMUNITY): Payer: Medicare Other

## 2021-05-02 ENCOUNTER — Inpatient Hospital Stay (HOSPITAL_COMMUNITY)
Admission: EM | Admit: 2021-05-02 | Discharge: 2021-05-17 | DRG: 166 | Disposition: A | Discharge status: Hospice | Payer: Medicare Other | Attending: Internal Medicine | Admitting: Internal Medicine

## 2021-05-02 ENCOUNTER — Other Ambulatory Visit: Payer: Self-pay

## 2021-05-02 ENCOUNTER — Encounter (HOSPITAL_COMMUNITY): Payer: Self-pay

## 2021-05-02 DIAGNOSIS — L89220 Pressure ulcer of left hip, unstageable: Secondary | ICD-10-CM | POA: Diagnosis present

## 2021-05-02 DIAGNOSIS — Z66 Do not resuscitate: Secondary | ICD-10-CM | POA: Diagnosis not present

## 2021-05-02 DIAGNOSIS — M5030 Other cervical disc degeneration, unspecified cervical region: Secondary | ICD-10-CM | POA: Diagnosis not present

## 2021-05-02 DIAGNOSIS — N189 Chronic kidney disease, unspecified: Secondary | ICD-10-CM

## 2021-05-02 DIAGNOSIS — E785 Hyperlipidemia, unspecified: Secondary | ICD-10-CM | POA: Diagnosis present

## 2021-05-02 DIAGNOSIS — J1282 Pneumonia due to coronavirus disease 2019: Secondary | ICD-10-CM | POA: Diagnosis not present

## 2021-05-02 DIAGNOSIS — Z9861 Coronary angioplasty status: Secondary | ICD-10-CM

## 2021-05-02 DIAGNOSIS — E1165 Type 2 diabetes mellitus with hyperglycemia: Secondary | ICD-10-CM | POA: Diagnosis not present

## 2021-05-02 DIAGNOSIS — M8458XA Pathological fracture in neoplastic disease, other specified site, initial encounter for fracture: Secondary | ICD-10-CM | POA: Diagnosis not present

## 2021-05-02 DIAGNOSIS — R109 Unspecified abdominal pain: Secondary | ICD-10-CM | POA: Diagnosis not present

## 2021-05-02 DIAGNOSIS — Z880 Allergy status to penicillin: Secondary | ICD-10-CM

## 2021-05-02 DIAGNOSIS — C781 Secondary malignant neoplasm of mediastinum: Secondary | ICD-10-CM | POA: Diagnosis not present

## 2021-05-02 DIAGNOSIS — N1831 Chronic kidney disease, stage 3a: Secondary | ICD-10-CM | POA: Diagnosis not present

## 2021-05-02 DIAGNOSIS — E669 Obesity, unspecified: Secondary | ICD-10-CM | POA: Diagnosis present

## 2021-05-02 DIAGNOSIS — J9601 Acute respiratory failure with hypoxia: Secondary | ICD-10-CM | POA: Diagnosis not present

## 2021-05-02 DIAGNOSIS — J188 Other pneumonia, unspecified organism: Secondary | ICD-10-CM | POA: Diagnosis not present

## 2021-05-02 DIAGNOSIS — K828 Other specified diseases of gallbladder: Secondary | ICD-10-CM | POA: Diagnosis present

## 2021-05-02 DIAGNOSIS — Z6832 Body mass index (BMI) 32.0-32.9, adult: Secondary | ICD-10-CM

## 2021-05-02 DIAGNOSIS — D8481 Immunodeficiency due to conditions classified elsewhere: Secondary | ICD-10-CM | POA: Diagnosis not present

## 2021-05-02 DIAGNOSIS — E039 Hypothyroidism, unspecified: Secondary | ICD-10-CM | POA: Diagnosis not present

## 2021-05-02 DIAGNOSIS — Z79890 Hormone replacement therapy (postmenopausal): Secondary | ICD-10-CM

## 2021-05-02 DIAGNOSIS — E119 Type 2 diabetes mellitus without complications: Secondary | ICD-10-CM

## 2021-05-02 DIAGNOSIS — C3411 Malignant neoplasm of upper lobe, right bronchus or lung: Principal | ICD-10-CM | POA: Diagnosis present

## 2021-05-02 DIAGNOSIS — C771 Secondary and unspecified malignant neoplasm of intrathoracic lymph nodes: Secondary | ICD-10-CM | POA: Diagnosis present

## 2021-05-02 DIAGNOSIS — Z800 Family history of malignant neoplasm of digestive organs: Secondary | ICD-10-CM

## 2021-05-02 DIAGNOSIS — M4802 Spinal stenosis, cervical region: Secondary | ICD-10-CM | POA: Diagnosis not present

## 2021-05-02 DIAGNOSIS — J432 Centrilobular emphysema: Secondary | ICD-10-CM | POA: Diagnosis present

## 2021-05-02 DIAGNOSIS — I10 Essential (primary) hypertension: Secondary | ICD-10-CM | POA: Diagnosis not present

## 2021-05-02 DIAGNOSIS — I4821 Permanent atrial fibrillation: Secondary | ICD-10-CM | POA: Diagnosis present

## 2021-05-02 DIAGNOSIS — R7989 Other specified abnormal findings of blood chemistry: Secondary | ICD-10-CM | POA: Diagnosis present

## 2021-05-02 DIAGNOSIS — I2510 Atherosclerotic heart disease of native coronary artery without angina pectoris: Secondary | ICD-10-CM | POA: Diagnosis present

## 2021-05-02 DIAGNOSIS — M549 Dorsalgia, unspecified: Secondary | ICD-10-CM | POA: Diagnosis not present

## 2021-05-02 DIAGNOSIS — Z885 Allergy status to narcotic agent status: Secondary | ICD-10-CM

## 2021-05-02 DIAGNOSIS — Z79899 Other long term (current) drug therapy: Secondary | ICD-10-CM

## 2021-05-02 DIAGNOSIS — R0602 Shortness of breath: Secondary | ICD-10-CM

## 2021-05-02 DIAGNOSIS — Z7901 Long term (current) use of anticoagulants: Secondary | ICD-10-CM

## 2021-05-02 DIAGNOSIS — I4891 Unspecified atrial fibrillation: Secondary | ICD-10-CM | POA: Diagnosis not present

## 2021-05-02 DIAGNOSIS — M47816 Spondylosis without myelopathy or radiculopathy, lumbar region: Secondary | ICD-10-CM | POA: Diagnosis present

## 2021-05-02 DIAGNOSIS — Z7189 Other specified counseling: Secondary | ICD-10-CM | POA: Diagnosis not present

## 2021-05-02 DIAGNOSIS — M4804 Spinal stenosis, thoracic region: Secondary | ICD-10-CM | POA: Diagnosis not present

## 2021-05-02 DIAGNOSIS — M48061 Spinal stenosis, lumbar region without neurogenic claudication: Secondary | ICD-10-CM | POA: Diagnosis present

## 2021-05-02 DIAGNOSIS — K219 Gastro-esophageal reflux disease without esophagitis: Secondary | ICD-10-CM | POA: Diagnosis present

## 2021-05-02 DIAGNOSIS — R200 Anesthesia of skin: Secondary | ICD-10-CM | POA: Diagnosis present

## 2021-05-02 DIAGNOSIS — Z7401 Bed confinement status: Secondary | ICD-10-CM

## 2021-05-02 DIAGNOSIS — Z7984 Long term (current) use of oral hypoglycemic drugs: Secondary | ICD-10-CM

## 2021-05-02 DIAGNOSIS — Z808 Family history of malignant neoplasm of other organs or systems: Secondary | ICD-10-CM

## 2021-05-02 DIAGNOSIS — N179 Acute kidney failure, unspecified: Secondary | ICD-10-CM | POA: Diagnosis not present

## 2021-05-02 DIAGNOSIS — C3490 Malignant neoplasm of unspecified part of unspecified bronchus or lung: Secondary | ICD-10-CM | POA: Diagnosis not present

## 2021-05-02 DIAGNOSIS — R079 Chest pain, unspecified: Secondary | ICD-10-CM | POA: Diagnosis not present

## 2021-05-02 DIAGNOSIS — Z8551 Personal history of malignant neoplasm of bladder: Secondary | ICD-10-CM

## 2021-05-02 DIAGNOSIS — I129 Hypertensive chronic kidney disease with stage 1 through stage 4 chronic kidney disease, or unspecified chronic kidney disease: Secondary | ICD-10-CM | POA: Diagnosis not present

## 2021-05-02 DIAGNOSIS — C799 Secondary malignant neoplasm of unspecified site: Secondary | ICD-10-CM | POA: Diagnosis not present

## 2021-05-02 DIAGNOSIS — I252 Old myocardial infarction: Secondary | ICD-10-CM

## 2021-05-02 DIAGNOSIS — S22060A Wedge compression fracture of T7-T8 vertebra, initial encounter for closed fracture: Secondary | ICD-10-CM

## 2021-05-02 DIAGNOSIS — Z515 Encounter for palliative care: Secondary | ICD-10-CM | POA: Diagnosis not present

## 2021-05-02 DIAGNOSIS — E1122 Type 2 diabetes mellitus with diabetic chronic kidney disease: Secondary | ICD-10-CM | POA: Diagnosis present

## 2021-05-02 DIAGNOSIS — R911 Solitary pulmonary nodule: Secondary | ICD-10-CM | POA: Diagnosis not present

## 2021-05-02 DIAGNOSIS — K5900 Constipation, unspecified: Secondary | ICD-10-CM | POA: Diagnosis not present

## 2021-05-02 DIAGNOSIS — I509 Heart failure, unspecified: Secondary | ICD-10-CM

## 2021-05-02 DIAGNOSIS — I4819 Other persistent atrial fibrillation: Secondary | ICD-10-CM

## 2021-05-02 DIAGNOSIS — Z96642 Presence of left artificial hip joint: Secondary | ICD-10-CM | POA: Diagnosis present

## 2021-05-02 DIAGNOSIS — Z743 Need for continuous supervision: Secondary | ICD-10-CM | POA: Diagnosis not present

## 2021-05-02 DIAGNOSIS — U071 COVID-19: Secondary | ICD-10-CM | POA: Diagnosis not present

## 2021-05-02 DIAGNOSIS — E876 Hypokalemia: Secondary | ICD-10-CM | POA: Diagnosis present

## 2021-05-02 DIAGNOSIS — Z87891 Personal history of nicotine dependence: Secondary | ICD-10-CM

## 2021-05-02 DIAGNOSIS — C7951 Secondary malignant neoplasm of bone: Secondary | ICD-10-CM | POA: Diagnosis present

## 2021-05-02 DIAGNOSIS — G4733 Obstructive sleep apnea (adult) (pediatric): Secondary | ICD-10-CM | POA: Diagnosis present

## 2021-05-02 DIAGNOSIS — Z833 Family history of diabetes mellitus: Secondary | ICD-10-CM

## 2021-05-02 DIAGNOSIS — K8689 Other specified diseases of pancreas: Secondary | ICD-10-CM | POA: Diagnosis present

## 2021-05-02 DIAGNOSIS — Z8249 Family history of ischemic heart disease and other diseases of the circulatory system: Secondary | ICD-10-CM

## 2021-05-02 DIAGNOSIS — R410 Disorientation, unspecified: Secondary | ICD-10-CM | POA: Diagnosis present

## 2021-05-02 DIAGNOSIS — Z419 Encounter for procedure for purposes other than remedying health state, unspecified: Secondary | ICD-10-CM

## 2021-05-02 HISTORY — DX: Malignant neoplasm of unspecified part of unspecified bronchus or lung: C34.90

## 2021-05-02 HISTORY — DX: Sleep apnea, unspecified: G47.30

## 2021-05-02 LAB — URINALYSIS, ROUTINE W REFLEX MICROSCOPIC
Bilirubin Urine: NEGATIVE
Glucose, UA: NEGATIVE mg/dL
Ketones, ur: NEGATIVE mg/dL
Nitrite: NEGATIVE
Protein, ur: 100 mg/dL — AB
Specific Gravity, Urine: 1.013 (ref 1.005–1.030)
pH: 5 (ref 5.0–8.0)

## 2021-05-02 LAB — COMPREHENSIVE METABOLIC PANEL
ALT: 99 U/L — ABNORMAL HIGH (ref 0–44)
AST: 125 U/L — ABNORMAL HIGH (ref 15–41)
Albumin: 2.8 g/dL — ABNORMAL LOW (ref 3.5–5.0)
Alkaline Phosphatase: 115 U/L (ref 38–126)
Anion gap: 16 — ABNORMAL HIGH (ref 5–15)
BUN: 20 mg/dL (ref 8–23)
CO2: 26 mmol/L (ref 22–32)
Calcium: 10.7 mg/dL — ABNORMAL HIGH (ref 8.9–10.3)
Chloride: 94 mmol/L — ABNORMAL LOW (ref 98–111)
Creatinine, Ser: 1.37 mg/dL — ABNORMAL HIGH (ref 0.44–1.00)
GFR, Estimated: 41 mL/min — ABNORMAL LOW (ref >60)
Glucose, Bld: 133 mg/dL — ABNORMAL HIGH (ref 70–99)
Potassium: 3 mmol/L — ABNORMAL LOW (ref 3.5–5.1)
Sodium: 136 mmol/L (ref 135–145)
Total Bilirubin: 0.6 mg/dL (ref 0.3–1.2)
Total Protein: 8.5 g/dL — ABNORMAL HIGH (ref 6.5–8.1)

## 2021-05-02 LAB — CBC WITH DIFFERENTIAL/PLATELET
Abs Immature Granulocytes: 0.13 10*3/uL — ABNORMAL HIGH (ref 0.00–0.07)
Basophils Absolute: 0 10*3/uL (ref 0.0–0.1)
Basophils Relative: 0 %
Eosinophils Absolute: 0 10*3/uL (ref 0.0–0.5)
Eosinophils Relative: 0 %
HCT: 37.7 % (ref 36.0–46.0)
Hemoglobin: 12.1 g/dL (ref 12.0–15.0)
Immature Granulocytes: 1 %
Lymphocytes Relative: 10 %
Lymphs Abs: 1.8 10*3/uL (ref 0.7–4.0)
MCH: 27.3 pg (ref 26.0–34.0)
MCHC: 32.1 g/dL (ref 30.0–36.0)
MCV: 84.9 fL (ref 80.0–100.0)
Monocytes Absolute: 1.5 10*3/uL — ABNORMAL HIGH (ref 0.1–1.0)
Monocytes Relative: 8 %
Neutro Abs: 15.3 10*3/uL — ABNORMAL HIGH (ref 1.7–7.7)
Neutrophils Relative %: 81 %
Platelets: 331 10*3/uL (ref 150–400)
RBC: 4.44 MIL/uL (ref 3.87–5.11)
RDW: 15.6 % — ABNORMAL HIGH (ref 11.5–15.5)
WBC: 18.7 10*3/uL — ABNORMAL HIGH (ref 4.0–10.5)
nRBC: 0 % (ref 0.0–0.2)

## 2021-05-02 LAB — BASIC METABOLIC PANEL
Anion gap: 12 (ref 5–15)
BUN: 16 mg/dL (ref 8–23)
CO2: 23 mmol/L (ref 22–32)
Calcium: 9.4 mg/dL (ref 8.9–10.3)
Chloride: 99 mmol/L (ref 98–111)
Creatinine, Ser: 1.18 mg/dL — ABNORMAL HIGH (ref 0.44–1.00)
GFR, Estimated: 49 mL/min — ABNORMAL LOW (ref >60)
Glucose, Bld: 106 mg/dL — ABNORMAL HIGH (ref 70–99)
Potassium: 3.3 mmol/L — ABNORMAL LOW (ref 3.5–5.1)
Sodium: 134 mmol/L — ABNORMAL LOW (ref 135–145)

## 2021-05-02 LAB — LACTIC ACID, PLASMA
Lactic Acid, Venous: 2 mmol/L (ref 0.5–1.9)
Lactic Acid, Venous: 1.5 mmol/L (ref 0.5–1.9)

## 2021-05-02 LAB — RESP PANEL BY RT-PCR (FLU A&B, COVID) ARPGX2
Influenza A by PCR: NEGATIVE
Influenza B by PCR: NEGATIVE
SARS Coronavirus 2 by RT PCR: POSITIVE — AB

## 2021-05-02 LAB — MAGNESIUM: Magnesium: 2 mg/dL (ref 1.7–2.4)

## 2021-05-02 MED ORDER — MORPHINE SULFATE (PF) 4 MG/ML IV SOLN
4.0000 mg | Freq: Once | INTRAVENOUS | Status: AC
  Administered 2021-05-02: 4 mg via INTRAVENOUS
  Filled 2021-05-02: qty 1

## 2021-05-02 MED ORDER — ENOXAPARIN SODIUM 40 MG/0.4ML IJ SOSY: 40.0000 mg | PREFILLED_SYRINGE | INTRAMUSCULAR | Status: DC

## 2021-05-02 MED ORDER — LORAZEPAM 2 MG/ML IJ SOLN: 2.0000 mg | Freq: Once | INTRAMUSCULAR | Status: DC

## 2021-05-02 MED ORDER — POTASSIUM CHLORIDE 10 MEQ/100ML IV SOLN
10.0000 meq | INTRAVENOUS | Status: AC
Start: 2021-05-02 — End: 2021-05-03
  Administered 2021-05-02 (×2): 10 meq via INTRAVENOUS
  Filled 2021-05-02 (×2): qty 100

## 2021-05-02 MED ORDER — APIXABAN 5 MG PO TABS: 5.0000 mg | ORAL_TABLET | Freq: Two times a day (BID) | ORAL | Status: DC

## 2021-05-02 MED ORDER — ACETAMINOPHEN 500 MG PO TABS: 500.0000 mg | ORAL_TABLET | Freq: Four times a day (QID) | ORAL | Status: DC | PRN

## 2021-05-02 MED ORDER — SODIUM CHLORIDE 0.9 % IV SOLN: INTRAVENOUS | Status: DC

## 2021-05-02 MED ORDER — POTASSIUM CHLORIDE CRYS ER 20 MEQ PO TBCR
40.0000 meq | EXTENDED_RELEASE_TABLET | Freq: Once | ORAL | Status: AC
  Administered 2021-05-02: 40 meq via ORAL
  Filled 2021-05-02: qty 2

## 2021-05-02 MED ORDER — ONDANSETRON HCL 4 MG/2ML IJ SOLN
4.0000 mg | Freq: Once | INTRAMUSCULAR | Status: AC
  Administered 2021-05-02: 4 mg via INTRAVENOUS
  Filled 2021-05-02: qty 2

## 2021-05-02 MED ORDER — LEVOTHYROXINE SODIUM 100 MCG PO TABS
100.0000 ug | ORAL_TABLET | Freq: Every day | ORAL | Status: DC
  Administered 2021-05-03 – 2021-05-16 (×14): 100 ug via ORAL
  Filled 2021-05-02 (×14): qty 1

## 2021-05-02 MED ORDER — SODIUM CHLORIDE 0.9 % IV BOLUS
30.0000 mL/kg | Freq: Once | INTRAVENOUS | Status: AC
  Administered 2021-05-02: 2532 mL via INTRAVENOUS

## 2021-05-02 MED ORDER — MORPHINE SULFATE (PF) 2 MG/ML IV SOLN
2.0000 mg | INTRAVENOUS | Status: DC | PRN
Start: 2021-05-02 — End: 2021-05-12
  Administered 2021-05-02 – 2021-05-12 (×26): 2 mg via INTRAVENOUS
  Filled 2021-05-02 (×28): qty 1

## 2021-05-02 MED ORDER — IOHEXOL 350 MG/ML SOLN
80.0000 mL | Freq: Once | INTRAVENOUS | Status: AC | PRN
  Administered 2021-05-02: 80 mL via INTRAVENOUS

## 2021-05-02 MED ORDER — HYDROCODONE-ACETAMINOPHEN 5-325 MG PO TABS
1.0000 | ORAL_TABLET | ORAL | Status: DC | PRN
  Administered 2021-05-03 – 2021-05-04 (×4): 1 via ORAL
  Filled 2021-05-02 (×5): qty 1

## 2021-05-02 MED ORDER — DILTIAZEM HCL ER COATED BEADS 240 MG PO CP24
240.0000 mg | ORAL_CAPSULE | Freq: Every day | ORAL | Status: DC
  Administered 2021-05-02 – 2021-05-03 (×2): 240 mg via ORAL
  Filled 2021-05-02 (×2): qty 1

## 2021-05-02 MED ORDER — ROSUVASTATIN CALCIUM 20 MG PO TABS
40.0000 mg | ORAL_TABLET | Freq: Every day | ORAL | Status: DC
  Administered 2021-05-02 – 2021-05-05 (×3): 40 mg via ORAL
  Filled 2021-05-02 (×4): qty 2

## 2021-05-02 MED ORDER — SENNA 8.6 MG PO TABS
1.0000 | ORAL_TABLET | Freq: Every day | ORAL | Status: DC
  Filled 2021-05-02: qty 1

## 2021-05-02 MED ORDER — PIPERACILLIN-TAZOBACTAM 3.375 G IVPB 30 MIN
3.3750 g | Freq: Once | INTRAVENOUS | Status: AC
  Administered 2021-05-02: 3.375 g via INTRAVENOUS
  Filled 2021-05-02: qty 50

## 2021-05-02 MED ORDER — DILTIAZEM HCL 25 MG/5ML IV SOLN
15.0000 mg | Freq: Once | INTRAVENOUS | Status: AC
  Administered 2021-05-02: 15 mg via INTRAVENOUS
  Filled 2021-05-02: qty 5

## 2021-05-02 MED ORDER — LORAZEPAM 2 MG/ML IJ SOLN
1.0000 mg | Freq: Once | INTRAMUSCULAR | Status: AC
  Administered 2021-05-02: 1 mg via INTRAVENOUS
  Filled 2021-05-02: qty 1

## 2021-05-02 MED ORDER — GADOBUTROL 1 MMOL/ML IV SOLN
8.0000 mL | Freq: Once | INTRAVENOUS | Status: AC | PRN
  Administered 2021-05-02: 8 mL via INTRAVENOUS

## 2021-05-02 NOTE — ED Notes (Signed)
Pillow wedge support under (L) buttock due to sore

## 2021-05-02 NOTE — ED Notes (Signed)
Pt transported to MRI 

## 2021-05-02 NOTE — ED Provider Notes (Signed)
[facility] DEPT Provider Note   CSN: [id number] Arrival date & time: 05/02/21  1304     History No chief complaint on file.   [name] is a 70 y.o. female.  Patient presents with continued back pain.  She states that she had back pain for several weeks now.  Describes an aching sharp pain in the mid to lower back nonradiating.  No associated fevers no cough no vomiting or diarrhea.  She had lower back repair by neurosurgery about 3 to 4 weeks ago, however she states she is still in significant pain.  She has been laying in bed most of the last several weeks and has developed some bedsores.        Past Medical History:  Diagnosis Date  . Arthritis    wrist, hands -  . Atrial fibrillation, persistent ([place])   . Bladder tumor   . CAD (coronary artery disease) cardiologist-  dr hochrien   PTCA RCA 1999  . Cataract   . Diabetes ([place])   . Full dentures   . GERD (gastroesophageal reflux disease)   . History of colon polyps    2005  hyperplastic  . History of MI (myocardial infarction)    1999  . HLD (hyperlipidemia)   . Hypertension   . Hypothyroidism   . SUI (stress urinary incontinence, female)     Patient Active Problem List   Diagnosis Date Noted  . Lung mass 03/21/2021  . Chronic midline low back pain without sciatica 02/03/2021  . AKI (acute kidney injury) ([place]) 11/16/2020  . Closed trimalleolar fracture of left ankle 11/09/2020  . Elbow pain, left 08/31/2020  . Conductive hearing loss, bilateral 07/25/2020  . Peripheral neuropathy 06/15/2020  . Chronic kidney disease (CKD), stage III (moderate) ([place]) 12/06/2019  . Dyslipidemia 11/25/2019  . Osteoarthritis of right knee 07/11/2019  . Microalbuminuria 12/07/2018  . OSA (obstructive sleep apnea) 03/16/2018  . History of bladder cancer 08/24/2017  . Claudication ([place]) 07/21/2017  . Onychomycosis of toenail 06/09/2017  . Long-term (current) use of anticoagulants 02/19/2015  .  Diabetes ([place]) 04/11/2014  . Atrial fibrillation ([place]) 12/18/2013  . Urinary incontinence 05/06/2011  . HLD (hyperlipidemia) 04/29/2009  . Obesity 04/29/2009  . Essential hypertension, benign 04/29/2009  . Coronary atherosclerosis 04/29/2009  . Hypothyroidism 02/23/2007  . MYOCARDIAL INFARCTION, OLD 02/23/2007  . Personal history of colonic adenoma 11/11/2004    Past Surgical History:  Procedure Laterality Date  . CARDIOVASCULAR STRESS TEST  05-01-2009   dr hochrein   normal nuclear study/  no ischemia or scar/  normal LV function and wall motion, ef 71%  . COLONOSCOPY     multiple  . CORONARY ANGIOPLASTY  Feb 1999   PTCA to RCA  . D & C HYSTEROSCOPY/  POLYPECTOMY  05-08-2009  . HYSTEROSCOPY WITH D & C  12/07/2012   Procedure: DILATATION AND CURETTAGE /HYSTEROSCOPY;  Surgeon: Elveria Royals, MD;  Location: [facility];  Service: Gynecology;  Laterality: N/A;  Dilitation and curettage /hysteroscopy/biopsey of lower segment of uterus  . ORIF ANKLE FRACTURE Left 11/10/2020   Procedure: OPEN REDUCTION INTERNAL FIXATION (ORIF) ANKLE FRACTURE;  Surgeon: Shona Needles, MD;  Location: [facility];  Service: Orthopedics;  Laterality: Left;  . TOTAL HIP ARTHROPLASTY Left 12-11-2010  . TRANSTHORACIC ECHOCARDIOGRAM  09-11-2015   mild LVH, ef 55%,  mildly reduced RVSF,  trivial TR  . TRANSURETHRAL RESECTION OF BLADDER TUMOR N/A 12/01/2015   Procedure: TRANSURETHRAL RESECTION OF BLADDER TUMOR (TURBT);  Surgeon: Kathie Rhodes, MD;  Location: [facility];  Service: Urology;  Laterality: N/A;  . TUBAL LIGATION  1974     OB History   No obstetric history on file.     Family History  Problem Relation Age of Onset  . Diabetes Mother   . Heart disease Mother   . Colon cancer Mother 64  . Heart disease Father   . Cancer Sister        Lung  . Cancer Brother        intestinal  . Atrial fibrillation Brother   . Cancer Brother        throat ca  . Cancer Brother        bladder  .  Breast cancer Neg Hx     Social History   Tobacco Use  . Smoking status: Former Smoker    Packs/day: 1.50    Years: 15.00    Pack years: 22.50    Types: Cigarettes    Quit date: 11/09/1981    Years since quitting: 39.5  . Smokeless tobacco: Never Used  . Tobacco comment: no plans to start  Vaping Use  . Vaping Use: Never used  Substance Use Topics  . Alcohol use: No  . Drug use: No    Home Medications Prior to Admission medications   Medication Sig Start Date End Date Taking? Authorizing Provider  acetaminophen (TYLENOL) 500 MG tablet Take 500 mg by mouth every 6 (six) hours as needed for headache (pain).   Yes [provider]  diltiazem (CARDIZEM CD) 240 MG 24 hr capsule TAKE 1 CAPSULE BY MOUTH EVERY DAY Patient taking differently: Take 240 mg by mouth at bedtime. 01/30/21  Yes Hochrein, Jeneen Rinks, MD  ELIQUIS 5 MG TABS tablet TAKE 1 TABLET(5 MG) BY MOUTH TWICE DAILY Patient taking differently: Take 5 mg by mouth 2 (two) times daily. 04/30/21  Yes Minus Breeding, MD  HYDROcodone-acetaminophen (NORCO/VICODIN) 5-325 MG tablet Take 1 tablet by mouth every 4 (four) hours as needed for moderate pain. 04/24/21  Yes [provider]  HYDROmorphone (DILAUDID) 4 MG tablet Take 1 tablet (4 mg total) by mouth every 6 (six) hours as needed for severe pain. 03/15/21  Yes [name] Ferguson, MD  levothyroxine (SYNTHROID) 100 MCG tablet TAKE 1 TABLET(100 MCG) BY MOUTH DAILY Patient taking differently: Take 100 mcg by mouth daily before breakfast. 01/30/21  Yes Benay Pike, MD  metFORMIN (GLUCOPHAGE-XR) 500 MG 24 hr tablet TAKE 1 TABLET(500 MG) BY MOUTH TWICE DAILY Patient taking differently: Take 500 mg by mouth in the morning and at bedtime. 11/18/20  Yes Benay Pike, MD  Polyethyl Glycol-Propyl Glycol (SYSTANE) 0.4-0.3 % SOLN Place 1 drop into both eyes 3 (three) times daily as needed (for dryness).   Yes [provider]  rosuvastatin (CRESTOR) 40 MG tablet TAKE 1  TABLET(40 MG) BY MOUTH DAILY Patient taking differently: Take 40 mg by mouth at bedtime. 12/29/20  Yes Minus Breeding, MD  senna (SENOKOT) 8.6 MG TABS tablet Take 1 tablet (8.6 mg total) by mouth daily as needed for mild constipation. 11/13/20  Yes Benay Pike, MD  lisinopril (ZESTRIL) 2.5 MG tablet TAKE 1 TABLET(2.5 MG) BY MOUTH DAILY Patient not taking: No sig reported 01/30/21   Benay Pike, MD  methocarbamol (ROBAXIN) 500 MG tablet Take 1 tablet (500 mg total) by mouth 3 (three) times daily. Patient not taking: No sig reported 02/23/21   Jaynee Eagles, PA-C  methylPREDNISolone (MEDROL DOSEPAK) 4 MG  TBPK tablet Day 1: 24 mg on day 1 administered as 8 mg (2 tablets) before breakfast, 4 mg (1 tablet) after lunch, 4 mg (1 tablet) after supper, and 8 mg (2 tablets) at bedtime or 24 mg (6 tablets) as a single dose or divided into 2 or 3 doses upon initiation (regardless of time of day). Day 2: 20 mg on day 2 administered as 4 mg (1 tablet) before breakfast, 4 mg (1 tablet) after lunch, 4 mg (1 tablet) after supper, and 8 mg (2 tablets) at bedtime. Day 3: 16 mg on day 3 administered as 4 mg (1 tablet) before breakfast, 4 mg (1 tablet) after lunch, 4 mg (1 tablet) after supper, and 4 mg (1 tablet) at bedtime. Day 4: 12 mg on day 4 administered as 4 mg (1 tablet) before breakfast, 4 mg (1 tablet) after lunch, and 4 mg (1 tablet) at bedtime. Day 5: 8 mg on day 5 administered as 4 mg (1 tablet) before breakfast and 4 mg (1 tablet) at bedtime. Day 6: 4 mg on day 6 administered as 4 mg (1 tablet) before breakfast. Patient not taking: No sig reported 03/18/21   Isla Pence, MD  [facility] DELICA LANCETS FINE MISC TEST TWICE DAILY( 8AM AND 10PM) Patient taking differently: 2 (two) times daily. 10/03/17   Glenis Smoker, MD  oxyCODONE (OXY IR/ROXICODONE) 5 MG immediate release tablet Take 0.5-1 tablets (2.5-5 mg total) by mouth every 6 (six) hours as needed for severe pain. Patient not taking: No sig  reported 04/01/21   Benay Pike, MD  diltiazem (DILACOR XR) 120 MG 24 hr capsule Take 1 capsule (120 mg total) by mouth daily. 09/15/11 05/15/12  Minus Breeding, MD    Allergies    Penicillins and Percocet [oxycodone-acetaminophen]  Review of Systems   Review of Systems  Constitutional: Negative for fever.  HENT: Negative for ear pain.   Eyes: Negative for pain.  Respiratory: Negative for cough.   Cardiovascular: Negative for chest pain.  Gastrointestinal: Negative for abdominal pain.  Genitourinary: Negative for flank pain.  Musculoskeletal: Positive for back pain.  Skin: Negative for rash.  Neurological: Negative for headaches.    Physical Exam Updated Vital Signs BP (!) 148/102   Pulse (!) 117   Temp 98.3 F (36.8 C)   Resp (!) 30   Ht 5\' 3"  (1.6 m)   Wt 84.4 kg   LMP  (LMP Unknown)   SpO2 92%   BMI 32.95 kg/m   Physical Exam Constitutional:      General: She is not in acute distress.    Appearance: Normal appearance.  HENT:     Head: Normocephalic.     Nose: Nose normal.  Eyes:     Extraocular Movements: Extraocular movements intact.  Cardiovascular:     Rate and Rhythm: Normal rate.  Pulmonary:     Effort: Pulmonary effort is normal.  Genitourinary:    Rectum: Normal.     Comments: Normal rectal sensation and tone. Musculoskeletal:        General: Normal range of motion.     Cervical back: Normal range of motion.     Comments: Tenderness palpation in the L2-3 midline area no step-offs noted.  Neurological:     General: No focal deficit present.     Mental Status: She is alert. Mental status is at baseline.     ED Results / Procedures / Treatments   Labs (all labs ordered are listed, but only abnormal results are  displayed) Labs Reviewed  RESP PANEL BY RT-PCR (FLU A&B, COVID) ARPGX2 - Abnormal; Notable for the following components:      Result Value   SARS Coronavirus 2 by RT PCR POSITIVE (*)    All other components within normal limits  CBC  WITH DIFFERENTIAL/PLATELET - Abnormal; Notable for the following components:   WBC 18.7 (*)    RDW 15.6 (*)    Neutro Abs 15.3 (*)    Monocytes Absolute 1.5 (*)    Abs Immature Granulocytes 0.13 (*)    All other components within normal limits  COMPREHENSIVE METABOLIC PANEL - Abnormal; Notable for the following components:   Potassium 3.0 (*)    Chloride 94 (*)    Glucose, Bld 133 (*)    Creatinine, Ser 1.37 (*)    Calcium 10.7 (*)    Total Protein 8.5 (*)    Albumin 2.8 (*)    AST 125 (*)    ALT 99 (*)    GFR, Estimated 41 (*)    Anion gap 16 (*)    All other components within normal limits  URINALYSIS, ROUTINE W REFLEX MICROSCOPIC - Abnormal; Notable for the following components:   APPearance HAZY (*)    Hgb urine dipstick LARGE (*)    Protein, ur 100 (*)    Leukocytes,Ua TRACE (*)    Bacteria, UA RARE (*)    All other components within normal limits  LACTIC ACID, PLASMA - Abnormal; Notable for the following components:   Lactic Acid, Venous 2.0 (*)    All other components within normal limits  URINE CULTURE  CULTURE, BLOOD (ROUTINE X 2)  CULTURE, BLOOD (ROUTINE X 2)  LACTIC ACID, PLASMA  MAGNESIUM  POC SARS CORONAVIRUS 2 AG -  ED    EKG EKG Interpretation  Date/Time:  Saturday May 02 2021 15:32:33 EDT Ventricular Rate:  109 PR Interval:    QRS Duration: 89 QT Interval:  337 QTC Calculation: 454 R Axis:   143 Text Interpretation: Atrial fibrillation Anteroseptal infarct, age indeterminate Confirmed by Thamas Jaegers ([id number]) on 05/02/2021 5:59:53 PM   Radiology CT Angio Chest PE W/Cm &/Or Wo Cm  Result Date: 05/02/2021 CLINICAL DATA:  Abdominal pain and acute, non localized lower to mid back pain. Back surgery 3 weeks ago. Recently diagnosed COVID-19. EXAM: CT ANGIOGRAPHY CHEST CT ABDOMEN AND PELVIS WITH CONTRAST TECHNIQUE: Multidetector CT imaging of the chest was performed using the standard protocol during bolus administration of intravenous contrast. Multiplanar  CT image reconstructions and MIPs were obtained to evaluate the vascular anatomy. Multidetector CT imaging of the abdomen and pelvis was performed using the standard protocol during bolus administration of intravenous contrast. CONTRAST:  26mL OMNIPAQUE IOHEXOL 350 MG/ML SOLN COMPARISON:  Chest, abdomen and pelvis CT dated 03/15/2021 FINDINGS: CTA CHEST FINDINGS Cardiovascular: Mildly enlarged heart. Normally opacified pulmonary arteries with no pulmonary arterial filling defects seen. Atheromatous calcifications, including the coronary arteries and aorta. Mediastinum/Nodes: Multiple significantly enlarged, confluent, low density the right hilar lymph nodes with vascular encasement with mild progression. This includes a lateral right hilar nodal mass with a short axis diameter of 3.3 cm on image number 41/4. Enlarged left hilar node without significant change, measuring 1.1 cm in short axis diameter on image number 41/4 Enlarged precarinal node with a short axis diameter of 1.3 cm on image number 35/4. Unremarkable esophagus.  Small, poorly visualized thyroid gland. Lungs/Pleura: Irregular right upper lobe mass measuring 5.1 x 3.2 cm on image number 39/11. This previously measured 4.4 x  2.3 cm in corresponding dimensions. 3 mm nodule more inferiorly in the outer right upper lobe on image number 47/11, without significant change. Four additional smaller nodules in the superior aspect of the right middle lobe, without significant change. Mild bilateral centrilobular bullous changes without significant change. Mild peripheral eccentric or a shin of the interstitial markings in both lungs without significant change. No pleural fluid. Musculoskeletal: Lytic and sclerotic changes involving the T8 vertebral body with interval 50% collapse of the vertebral body and soft tissue extending into the anterior aspect of the spinal canal, causing moderate canal stenosis. There is also mild diffuse paraspinal extension extending  superiorly and inferiorly. This is also involving the pedicles and posterior elements bilaterally with a pars fracture on the left. The fracture is nondisplaced. Lytic lesion in the posterior, superior aspect of the T9 vertebral body. Additional lytic lesion involving the inferior aspects of the T9 inferior facets without canal extension. There is a suggestion of poorly defined, lytic areas in additional thoracic vertebral bodies. Stable old, mild T11 and T12 superior endplate compression deformities. Thoracic and lower cervical spine degenerative changes. There is also an oval lytic lesion in the posterior aspect of the left 3rd rib. Review of the MIP images confirms the above findings. CT ABDOMEN and PELVIS FINDINGS Hepatobiliary: Interval dilated gallbladder without visible gallstones or wall thickening. No pericholecystic fluid. Unremarkable liver. Pancreas: Moderate diffuse pancreatic atrophy. Spleen: Normal in size without focal abnormality. Adrenals/Urinary Tract: Adrenal glands are unremarkable. Kidneys are normal, without renal calculi, focal lesion, or hydronephrosis. Bladder is unremarkable. Stomach/Bowel: Stomach is within normal limits. Appendix appears normal. No evidence of bowel wall thickening, distention, or inflammatory changes. Vascular/Lymphatic: Atheromatous arterial calcifications without aneurysm. No enlarged lymph nodes. Reproductive: Uterus and bilateral adnexa are unremarkable. Other: Small umbilical hernia containing fat. Musculoskeletal: Left hip prosthesis with associated streak artifacts. Posterior left iliac bone lytic lesion. Anterior, inferior L2 vertebral body lytic lesion with mild paraspinal extension. Anterior, superior S1 vertebral lytic lesion. L4 kyphoplasty and small lytic areas. Review of the MIP images confirms the above findings. IMPRESSION: 1. Right upper lobe lung cancer with metastatic mediastinal and right hilar adenopathy and possible metastatic left hilar  adenopathy. 2. Multiple bony metastases with interval collapse of the T8 vertebral body with paraspinal extension of tumor, including into the anterior aspect of the spinal canal, causing moderate canal stenosis. This could progressed to cord compression. 3. Multiple small right lung nodules, suspicious for possible metastases. 4. No pulmonary emboli. 5. No acute abnormality in the abdomen or pelvis other than interval gallbladder dilatation. 6.  Calcific coronary artery and aortic atherosclerosis. Critical Value/emergent results were called by telephone at the time of interpretation on 05/02/2021 at 6:02 pm to provider [facility] , who verbally acknowledged these results. Electronically Signed   By: Claudie Revering M.D.   On: 05/02/2021 18:02   CT Abdomen Pelvis W Contrast  Result Date: 05/02/2021 CLINICAL DATA:  Abdominal pain and acute, non localized lower to mid back pain. Back surgery 3 weeks ago. Recently diagnosed COVID-19. EXAM: CT ANGIOGRAPHY CHEST CT ABDOMEN AND PELVIS WITH CONTRAST TECHNIQUE: Multidetector CT imaging of the chest was performed using the standard protocol during bolus administration of intravenous contrast. Multiplanar CT image reconstructions and MIPs were obtained to evaluate the vascular anatomy. Multidetector CT imaging of the abdomen and pelvis was performed using the standard protocol during bolus administration of intravenous contrast. CONTRAST:  9mL OMNIPAQUE IOHEXOL 350 MG/ML SOLN COMPARISON:  Chest, abdomen and pelvis CT dated  03/15/2021 FINDINGS: CTA CHEST FINDINGS Cardiovascular: Mildly enlarged heart. Normally opacified pulmonary arteries with no pulmonary arterial filling defects seen. Atheromatous calcifications, including the coronary arteries and aorta. Mediastinum/Nodes: Multiple significantly enlarged, confluent, low density the right hilar lymph nodes with vascular encasement with mild progression. This includes a lateral right hilar nodal mass with a short axis  diameter of 3.3 cm on image number 41/4. Enlarged left hilar node without significant change, measuring 1.1 cm in short axis diameter on image number 41/4 Enlarged precarinal node with a short axis diameter of 1.3 cm on image number 35/4. Unremarkable esophagus.  Small, poorly visualized thyroid gland. Lungs/Pleura: Irregular right upper lobe mass measuring 5.1 x 3.2 cm on image number 39/11. This previously measured 4.4 x 2.3 cm in corresponding dimensions. 3 mm nodule more inferiorly in the outer right upper lobe on image number 47/11, without significant change. Four additional smaller nodules in the superior aspect of the right middle lobe, without significant change. Mild bilateral centrilobular bullous changes without significant change. Mild peripheral eccentric or a shin of the interstitial markings in both lungs without significant change. No pleural fluid. Musculoskeletal: Lytic and sclerotic changes involving the T8 vertebral body with interval 50% collapse of the vertebral body and soft tissue extending into the anterior aspect of the spinal canal, causing moderate canal stenosis. There is also mild diffuse paraspinal extension extending superiorly and inferiorly. This is also involving the pedicles and posterior elements bilaterally with a pars fracture on the left. The fracture is nondisplaced. Lytic lesion in the posterior, superior aspect of the T9 vertebral body. Additional lytic lesion involving the inferior aspects of the T9 inferior facets without canal extension. There is a suggestion of poorly defined, lytic areas in additional thoracic vertebral bodies. Stable old, mild T11 and T12 superior endplate compression deformities. Thoracic and lower cervical spine degenerative changes. There is also an oval lytic lesion in the posterior aspect of the left 3rd rib. Review of the MIP images confirms the above findings. CT ABDOMEN and PELVIS FINDINGS Hepatobiliary: Interval dilated gallbladder without  visible gallstones or wall thickening. No pericholecystic fluid. Unremarkable liver. Pancreas: Moderate diffuse pancreatic atrophy. Spleen: Normal in size without focal abnormality. Adrenals/Urinary Tract: Adrenal glands are unremarkable. Kidneys are normal, without renal calculi, focal lesion, or hydronephrosis. Bladder is unremarkable. Stomach/Bowel: Stomach is within normal limits. Appendix appears normal. No evidence of bowel wall thickening, distention, or inflammatory changes. Vascular/Lymphatic: Atheromatous arterial calcifications without aneurysm. No enlarged lymph nodes. Reproductive: Uterus and bilateral adnexa are unremarkable. Other: Small umbilical hernia containing fat. Musculoskeletal: Left hip prosthesis with associated streak artifacts. Posterior left iliac bone lytic lesion. Anterior, inferior L2 vertebral body lytic lesion with mild paraspinal extension. Anterior, superior S1 vertebral lytic lesion. L4 kyphoplasty and small lytic areas. Review of the MIP images confirms the above findings. IMPRESSION: 1. Right upper lobe lung cancer with metastatic mediastinal and right hilar adenopathy and possible metastatic left hilar adenopathy. 2. Multiple bony metastases with interval collapse of the T8 vertebral body with paraspinal extension of tumor, including into the anterior aspect of the spinal canal, causing moderate canal stenosis. This could progressed to cord compression. 3. Multiple small right lung nodules, suspicious for possible metastases. 4. No pulmonary emboli. 5. No acute abnormality in the abdomen or pelvis other than interval gallbladder dilatation. 6.  Calcific coronary artery and aortic atherosclerosis. Critical Value/emergent results were called by telephone at the time of interpretation on 05/02/2021 at 6:02 pm to provider [facility] , who verbally acknowledged  these results. Electronically Signed   By: Claudie Revering M.D.   On: 05/02/2021 18:02    Procedures .Critical  Care Performed by: Luna Fuse, MD Authorized by: Luna Fuse, MD   Critical care provider statement:    Critical care time (minutes):  40   Critical care time was exclusive of:  Separately billable procedures and treating other patients   Critical care was necessary to treat or prevent imminent or life-threatening deterioration of the following conditions:  Cardiac failure     Medications Ordered in ED Medications  morphine 4 MG/ML injection 4 mg (has no administration in time range)  LORazepam (ATIVAN) injection 1 mg (has no administration in time range)  sodium chloride 0.9 % bolus 2,532 mL (0 mLs Intravenous Stopped 05/02/21 1622)  morphine 4 MG/ML injection 4 mg (4 mg Intravenous Given 05/02/21 1440)  ondansetron (ZOFRAN) injection 4 mg (4 mg Intravenous Given 05/02/21 1439)  potassium chloride SA (KLOR-CON) CR tablet 40 mEq (40 mEq Oral Given 05/02/21 1617)  piperacillin-tazobactam (ZOSYN) IVPB 3.375 g (0 g Intravenous Stopped 05/02/21 1758)  iohexol (OMNIPAQUE) 350 MG/ML injection 80 mL (80 mLs Intravenous Contrast Given 05/02/21 1635)  diltiazem (CARDIZEM) injection 15 mg (15 mg Intravenous Given 05/02/21 1846)    ED Course  I have reviewed the triage vital signs and the nursing notes.  Pertinent labs & imaging results that were available during my care of the patient were reviewed by me and considered in my medical decision making (see chart for details).    MDM Rules/Calculators/A&P                          Patient returns to the ER postoperative tachycardic with mid to lower back pain.  Work-up included CT imaging and cardiac work-up.  She has history of atrial fibrillation appears to be in rapid rate.  Given IV fluids and pain medication with improvement of heart rate.  Imaging however is concerning for new diagnosed metastatic disease with compression fracture of T8 region with moderate stenosis noted.  Neurosurgery has been paged.  Hospitalist have been paged for admission.   MRI of the spine has been ordered and pending.  Patient otherwise moving all extremities with normal rectal tone no saddle anesthesia noted.  Heart rate appears improved with interventions given here in the ER.  Final Clinical Impression(s) / ED Diagnoses Final diagnoses:  Metastatic malignant neoplasm, unspecified site [facility])  Compression fracture of T8 vertebra, initial encounter ([place])  Atrial fibrillation with [facility])    Rx / DC Orders ED Discharge Orders    None       Luna Fuse, MD 05/02/21 1857

## 2021-05-02 NOTE — ED Triage Notes (Signed)
Per EMS pt from home, 3 weeks age pt had back surgery and states they injected "cement". Tested positive for covid one week ago and has not been out of bed. States she is constipated but will not take stool softner. Pressure wound to left hip that has been there a week and has a rash in the groin area.  130/70 P 100 R 22 95 % RA

## 2021-05-02 NOTE — ED Notes (Signed)
Contact precautions caddy placed outside of patients door

## 2021-05-02 NOTE — ED Notes (Signed)
Bed alarm on.

## 2021-05-02 NOTE — ED Notes (Signed)
External female catheter placed on patient.

## 2021-05-02 NOTE — H&P (Addendum)
History and Physical    [name]:[id number] DOB: 07-24-1951 DOA: 05/02/2021  PCP: Benay Pike, MD  Patient coming from: Home  I have personally briefly reviewed patient's old medical records in [place]  Chief Complaint: worsening back pain  HPI: [name] is a 70 y.o. female with medical history significant for persistent atrial fibrillation on Eliquis, hx of bladder cancer, HTN, CAD, OSA, Hypothyroidism, CKD 3A, Type 2 DM and recent COVID infection (04/22/21) who presents with worsening back pain.   Pt unable to provide history during my evaluation since she received IV Ativan and morphine for MRI and was disoriented.    Patient had been evaluated in the ED several times in the last 2 months for worsening back pain and treated with steroids and opioid pain management.  She followed up with neurosurgery Dr. Trenton Gammon and had MRI of the L spine on 03/14/21 with Schmorl's nodes to L4 vertebral body and multilevel disease and facet disease especially around L4-5 with mild to moderate spinal stenosis and moderate bilateral lateral recess stenosis. She then underwent L4 kyphoplasty with Dr. Trenton Gammon early April.   On one of those visits, she was also diagnosed with UTI and possible lung infection versus mass.  She had CT chest on 3/20 showing a spiculated solid lesion in the right upper lobe with recommended follow-up imaging. She followed up with PCP who recommended repeat but does not seem like she followed up with this.   She also noted to have tested positive for COVID on 04/22/21.    ED Course: She was afebrile, tachycardic and tachypneic and placed on 2 L.  Her heart rate improved following pain medication and 10 mg of diltiazem.  CBC shows leukocytosis of 18.7, lactate of 2.  Potassium of 3.  Creatinine of 1.37 from prior of 1.14.  Anion gap of 16.  AST of 125.  ALT of 99.  CTA chest negative for PE but now shows right upper lobe lung cancer with metastasis to  mediastinal and bony metastasis to T8 vertebral body with paraspinal extension of tumor into the anterior aspect of the spinal canal.  ED physician Dr. Almyra Free discussed new findings with her neurosurgeon Dr. Trenton Gammon who recommended MRI of total spine.  Review of Systems: Unable to obtain given patient's disorientation after receiving IV Ativan  Past Medical History:  Diagnosis Date  . Arthritis    wrist, hands -  . Atrial fibrillation, persistent ([place])   . Bladder tumor   . CAD (coronary artery disease) cardiologist-  dr hochrien   PTCA RCA 1999  . Cataract   . Diabetes ([place])   . Full dentures   . GERD (gastroesophageal reflux disease)   . History of colon polyps    2005  hyperplastic  . History of MI (myocardial infarction)    1999  . HLD (hyperlipidemia)   . Hypertension   . Hypothyroidism   . 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 SUI (stress urinary incontinence, female)     Past Surgical History:  Procedure Laterality Date  . CARDIOVASCULAR STRESS TEST  05-01-2009   dr hochrein   normal nuclear study/  no ischemia or scar/  normal LV function and wall motion, ef 71%  . COLONOSCOPY     multiple  . CORONARY ANGIOPLASTY  Feb 1999   PTCA to RCA  . D & C HYSTEROSCOPY/  POLYPECTOMY  05-08-2009  . HYSTEROSCOPY WITH D & C  12/07/2012   Procedure:  DILATATION AND CURETTAGE /HYSTEROSCOPY;  Surgeon: Elveria Royals, MD;  Location: [facility];  Service: Gynecology;  Laterality: N/A;  Dilitation and curettage /hysteroscopy/biopsey of lower segment of uterus  . ORIF ANKLE FRACTURE Left 11/10/2020   Procedure: OPEN REDUCTION INTERNAL FIXATION (ORIF) ANKLE FRACTURE;  Surgeon: Shona Needles, MD;  Location: [facility];  Service: Orthopedics;  Laterality: Left;  . TOTAL HIP ARTHROPLASTY Left 12-11-2010  . TRANSTHORACIC ECHOCARDIOGRAM  09-11-2015   mild LVH, ef 55%,  mildly reduced RVSF,  trivial TR  . TRANSURETHRAL RESECTION OF BLADDER TUMOR N/A 12/01/2015   Procedure: TRANSURETHRAL  RESECTION OF BLADDER TUMOR (TURBT);  Surgeon: Kathie Rhodes, MD;  Location: [facility];  Service: Urology;  Laterality: N/A;  . TUBAL LIGATION  1974     reports that she quit smoking about 39 years ago. Her smoking use included cigarettes. She has a 22.50 pack-year smoking history. She has never used smokeless tobacco. She reports that she does not drink alcohol and does not use drugs. Social History  Allergies  Allergen Reactions  . Penicillins Itching    Has patient had a PCN reaction causing immediate rash, facial/tongue/throat swelling, SOB or lightheadedness with hypotension: Yes Has patient had a PCN reaction causing severe rash involving mucus membranes or skin necrosis: No Has patient had a PCN reaction that required hospitalization: No Has patient had a PCN reaction occurring within the last 10 years: Yes If all of the above answers are "NO", then may proceed with Cephalosporin use.   Marland Kitchen Percocet [Oxycodone-Acetaminophen] Itching    Family History  Problem Relation Age of Onset  . Diabetes Mother   . Heart disease Mother   . Colon cancer Mother 76  . Heart disease Father   . Cancer Sister        Lung  . Cancer Brother        intestinal  . Atrial fibrillation Brother   . Cancer Brother        throat ca  . Cancer Brother        bladder  . Breast cancer Neg Hx      Prior to Admission medications   Medication Sig Start Date End Date Taking? Authorizing Provider  acetaminophen (TYLENOL) 500 MG tablet Take 500 mg by mouth every 6 (six) hours as needed for headache (pain).   Yes [provider]  diltiazem (CARDIZEM CD) 240 MG 24 hr capsule TAKE 1 CAPSULE BY MOUTH EVERY DAY Patient taking differently: Take 240 mg by mouth at bedtime. 01/30/21  Yes Hochrein, Jeneen Rinks, MD  ELIQUIS 5 MG TABS tablet TAKE 1 TABLET(5 MG) BY MOUTH TWICE DAILY Patient taking differently: Take 5 mg by mouth 2 (two) times daily. 04/30/21  Yes Minus Breeding, MD   HYDROcodone-acetaminophen (NORCO/VICODIN) 5-325 MG tablet Take 1 tablet by mouth every 4 (four) hours as needed for moderate pain. 04/24/21  Yes [provider]  HYDROmorphone (DILAUDID) 4 MG tablet Take 1 tablet (4 mg total) by mouth every 6 (six) hours as needed for severe pain. 03/15/21  Yes [name] Ferguson, MD  levothyroxine (SYNTHROID) 100 MCG tablet TAKE 1 TABLET(100 MCG) BY MOUTH DAILY Patient taking differently: Take 100 mcg by mouth daily before breakfast. 01/30/21  Yes Benay Pike, MD  metFORMIN (GLUCOPHAGE-XR) 500 MG 24 hr tablet TAKE 1 TABLET(500 MG) BY MOUTH TWICE DAILY Patient taking differently: Take 500 mg by mouth in the morning and at bedtime. 11/18/20  Yes Benay Pike, MD  Polyethyl Glycol-Propyl Glycol (SYSTANE) 0.4-0.3 %  SOLN Place 1 drop into both eyes 3 (three) times daily as needed (for dryness).   Yes [provider]  rosuvastatin (CRESTOR) 40 MG tablet TAKE 1 TABLET(40 MG) BY MOUTH DAILY Patient taking differently: Take 40 mg by mouth at bedtime. 12/29/20  Yes Minus Breeding, MD  senna (SENOKOT) 8.6 MG TABS tablet Take 1 tablet (8.6 mg total) by mouth daily as needed for mild constipation. 11/13/20  Yes Benay Pike, MD  lisinopril (ZESTRIL) 2.5 MG tablet TAKE 1 TABLET(2.5 MG) BY MOUTH DAILY Patient not taking: No sig reported 01/30/21   Benay Pike, MD  methocarbamol (ROBAXIN) 500 MG tablet Take 1 tablet (500 mg total) by mouth 3 (three) times daily. Patient not taking: No sig reported 02/23/21   Jaynee Eagles, PA-C  methylPREDNISolone (MEDROL DOSEPAK) 4 MG TBPK tablet Day 1: 24 mg on day 1 administered as 8 mg (2 tablets) before breakfast, 4 mg (1 tablet) after lunch, 4 mg (1 tablet) after supper, and 8 mg (2 tablets) at bedtime or 24 mg (6 tablets) as a single dose or divided into 2 or 3 doses upon initiation (regardless of time of day). Day 2: 20 mg on day 2 administered as 4 mg (1 tablet) before breakfast, 4 mg (1 tablet) after lunch, 4 mg (1  tablet) after supper, and 8 mg (2 tablets) at bedtime. Day 3: 16 mg on day 3 administered as 4 mg (1 tablet) before breakfast, 4 mg (1 tablet) after lunch, 4 mg (1 tablet) after supper, and 4 mg (1 tablet) at bedtime. Day 4: 12 mg on day 4 administered as 4 mg (1 tablet) before breakfast, 4 mg (1 tablet) after lunch, and 4 mg (1 tablet) at bedtime. Day 5: 8 mg on day 5 administered as 4 mg (1 tablet) before breakfast and 4 mg (1 tablet) at bedtime. Day 6: 4 mg on day 6 administered as 4 mg (1 tablet) before breakfast. Patient not taking: No sig reported 03/18/21   Isla Pence, MD  [facility] DELICA LANCETS FINE MISC TEST TWICE DAILY( 8AM AND 10PM) Patient taking differently: 2 (two) times daily. 10/03/17   Glenis Smoker, MD  oxyCODONE (OXY IR/ROXICODONE) 5 MG immediate release tablet Take 0.5-1 tablets (2.5-5 mg total) by mouth every 6 (six) hours as needed for severe pain. Patient not taking: No sig reported 04/01/21   Benay Pike, MD  diltiazem (DILACOR XR) 120 MG 24 hr capsule Take 1 capsule (120 mg total) by mouth daily. 09/15/11 05/15/12  Minus Breeding, MD    Physical Exam: Vitals:   05/02/21 1815 05/02/21 1830 05/02/21 1845 05/02/21 1900  BP:  (!) 138/96  (!) 135/102  Pulse: (!) 113 (!) 109  (!) 117  Resp: 15 (!) 21 (!) 23 18  Temp:      TempSrc:      SpO2: 91% 91%  92%  Weight:      Height:        Constitutional: NAD, alert and disoriented elderly female laying flat in bed in discomfort Vitals:   05/02/21 1815 05/02/21 1830 05/02/21 1845 05/02/21 1900  BP:  (!) 138/96  (!) 135/102  Pulse: (!) 113 (!) 109  (!) 117  Resp: 15 (!) 21 (!) 23 18  Temp:      TempSrc:      SpO2: 91% 91%  92%  Weight:      Height:       Eyes: PERRL, lids and conjunctivae normal ENMT: Mucous membranes are moist.  Neck: normal, supple Respiratory: clear to auscultation bilaterally, no wheezing, no crackles. Normal respiratory effort. No accessory muscle use.  Cardiovascular: Regular rate  and rhythm, no murmurs / rubs / gallops. No extremity edema. Abdomen: no tenderness, no masses palpated.  Bowel sounds positive.  Musculoskeletal: no clubbing / cyanosis. No joint deformity upper and lower extremities. Good ROM, no contractures. Normal muscle tone.  Skin: no rashes, lesions, ulcers. No induration Neurologic: Alert but disoriented following receiving IV Ativan.  Patient only endorsed pain and confusion.  Unable to answer any other questions.  Unable to follow simple commands.  She was able to move all 4 extremities. Psychiatric: Alert but disoriented.    Labs on Admission: I have personally reviewed following labs and imaging studies  CBC: Recent Labs  Lab 05/02/21 1450  WBC 18.7*  NEUTROABS 15.3*  HGB 12.1  HCT 37.7  MCV 84.9  PLT 426   Basic Metabolic Panel: Recent Labs  Lab 05/02/21 1450  NA 136  K 3.0*  CL 94*  CO2 26  GLUCOSE 133*  BUN 20  CREATININE 1.37*  CALCIUM 10.7*  MG 2.0   GFR: Estimated Creatinine Clearance: 38.2 mL/min (A) (by C-G formula based on SCr of 1.37 mg/dL (H)). Liver Function Tests: Recent Labs  Lab 05/02/21 1450  AST 125*  ALT 99*  ALKPHOS 115  BILITOT 0.6  PROT 8.5*  ALBUMIN 2.8*   No results for input(s): LIPASE, AMYLASE in the last 168 hours. No results for input(s): AMMONIA in the last 168 hours. Coagulation Profile: No results for input(s): INR, PROTIME in the last 168 hours. Cardiac Enzymes: No results for input(s): CKTOTAL, CKMB, CKMBINDEX, TROPONINI in the last 168 hours. BNP (last 3 results) No results for input(s): PROBNP in the last 8760 hours. HbA1C: No results for input(s): HGBA1C in the last 72 hours. CBG: No results for input(s): GLUCAP in the last 168 hours. Lipid Profile: No results for input(s): CHOL, HDL, LDLCALC, TRIG, CHOLHDL, LDLDIRECT in the last 72 hours. Thyroid Function Tests: No results for input(s): TSH, T4TOTAL, FREET4, T3FREE, THYROIDAB in the last 72 hours. Anemia Panel: No  results for input(s): VITAMINB12, FOLATE, FERRITIN, TIBC, IRON, RETICCTPCT in the last 72 hours. Urine analysis:    Component Value Date/Time   COLORURINE YELLOW 05/02/2021 1701   APPEARANCEUR HAZY (A) 05/02/2021 1701   LABSPEC 1.013 05/02/2021 1701   PHURINE 5.0 05/02/2021 1701   GLUCOSEU NEGATIVE 05/02/2021 1701   HGBUR LARGE (A) 05/02/2021 1701   BILIRUBINUR NEGATIVE 05/02/2021 1701   BILIRUBINUR SMALL 09/03/2015 0924   KETONESUR NEGATIVE 05/02/2021 1701   PROTEINUR 100 (A) 05/02/2021 1701   UROBILINOGEN 1.0 09/11/2015 1151   NITRITE NEGATIVE 05/02/2021 1701   LEUKOCYTESUR TRACE (A) 05/02/2021 1701    Radiological Exams on Admission: CT Angio Chest PE W/Cm &/Or Wo Cm  Result Date: 05/02/2021 CLINICAL DATA:  Abdominal pain and acute, non localized lower to mid back pain. Back surgery 3 weeks ago. Recently diagnosed COVID-19. EXAM: CT ANGIOGRAPHY CHEST CT ABDOMEN AND PELVIS WITH CONTRAST TECHNIQUE: Multidetector CT imaging of the chest was performed using the standard protocol during bolus administration of intravenous contrast. Multiplanar CT image reconstructions and MIPs were obtained to evaluate the vascular anatomy. Multidetector CT imaging of the abdomen and pelvis was performed using the standard protocol during bolus administration of intravenous contrast. CONTRAST:  65mL OMNIPAQUE IOHEXOL 350 MG/ML SOLN COMPARISON:  Chest, abdomen and pelvis CT dated 03/15/2021 FINDINGS: CTA CHEST FINDINGS Cardiovascular: Mildly enlarged heart. Normally opacified pulmonary arteries  with no pulmonary arterial filling defects seen. Atheromatous calcifications, including the coronary arteries and aorta. Mediastinum/Nodes: Multiple significantly enlarged, confluent, low density the right hilar lymph nodes with vascular encasement with mild progression. This includes a lateral right hilar nodal mass with a short axis diameter of 3.3 cm on image number 41/4. Enlarged left hilar node without significant  change, measuring 1.1 cm in short axis diameter on image number 41/4 Enlarged precarinal node with a short axis diameter of 1.3 cm on image number 35/4. Unremarkable esophagus.  Small, poorly visualized thyroid gland. Lungs/Pleura: Irregular right upper lobe mass measuring 5.1 x 3.2 cm on image number 39/11. This previously measured 4.4 x 2.3 cm in corresponding dimensions. 3 mm nodule more inferiorly in the outer right upper lobe on image number 47/11, without significant change. Four additional smaller nodules in the superior aspect of the right middle lobe, without significant change. Mild bilateral centrilobular bullous changes without significant change. Mild peripheral eccentric or a shin of the interstitial markings in both lungs without significant change. No pleural fluid. Musculoskeletal: Lytic and sclerotic changes involving the T8 vertebral body with interval 50% collapse of the vertebral body and soft tissue extending into the anterior aspect of the spinal canal, causing moderate canal stenosis. There is also mild diffuse paraspinal extension extending superiorly and inferiorly. This is also involving the pedicles and posterior elements bilaterally with a pars fracture on the left. The fracture is nondisplaced. Lytic lesion in the posterior, superior aspect of the T9 vertebral body. Additional lytic lesion involving the inferior aspects of the T9 inferior facets without canal extension. There is a suggestion of poorly defined, lytic areas in additional thoracic vertebral bodies. Stable old, mild T11 and T12 superior endplate compression deformities. Thoracic and lower cervical spine degenerative changes. There is also an oval lytic lesion in the posterior aspect of the left 3rd rib. Review of the MIP images confirms the above findings. CT ABDOMEN and PELVIS FINDINGS Hepatobiliary: Interval dilated gallbladder without visible gallstones or wall thickening. No pericholecystic fluid. Unremarkable liver.  Pancreas: Moderate diffuse pancreatic atrophy. Spleen: Normal in size without focal abnormality. Adrenals/Urinary Tract: Adrenal glands are unremarkable. Kidneys are normal, without renal calculi, focal lesion, or hydronephrosis. Bladder is unremarkable. Stomach/Bowel: Stomach is within normal limits. Appendix appears normal. No evidence of bowel wall thickening, distention, or inflammatory changes. Vascular/Lymphatic: Atheromatous arterial calcifications without aneurysm. No enlarged lymph nodes. Reproductive: Uterus and bilateral adnexa are unremarkable. Other: Small umbilical hernia containing fat. Musculoskeletal: Left hip prosthesis with associated streak artifacts. Posterior left iliac bone lytic lesion. Anterior, inferior L2 vertebral body lytic lesion with mild paraspinal extension. Anterior, superior S1 vertebral lytic lesion. L4 kyphoplasty and small lytic areas. Review of the MIP images confirms the above findings. IMPRESSION: 1. Right upper lobe lung cancer with metastatic mediastinal and right hilar adenopathy and possible metastatic left hilar adenopathy. 2. Multiple bony metastases with interval collapse of the T8 vertebral body with paraspinal extension of tumor, including into the anterior aspect of the spinal canal, causing moderate canal stenosis. This could progressed to cord compression. 3. Multiple small right lung nodules, suspicious for possible metastases. 4. No pulmonary emboli. 5. No acute abnormality in the abdomen or pelvis other than interval gallbladder dilatation. 6.  Calcific coronary artery and aortic atherosclerosis. Critical Value/emergent results were called by telephone at the time of interpretation on 05/02/2021 at 6:02 pm to provider [facility] , who verbally acknowledged these results. Electronically Signed   By: Percell Locus.D.  On: 05/02/2021 18:02   CT Abdomen Pelvis W Contrast  Result Date: 05/02/2021 CLINICAL DATA:  Abdominal pain and acute, non localized lower to  mid back pain. Back surgery 3 weeks ago. Recently diagnosed COVID-19. EXAM: CT ANGIOGRAPHY CHEST CT ABDOMEN AND PELVIS WITH CONTRAST TECHNIQUE: Multidetector CT imaging of the chest was performed using the standard protocol during bolus administration of intravenous contrast. Multiplanar CT image reconstructions and MIPs were obtained to evaluate the vascular anatomy. Multidetector CT imaging of the abdomen and pelvis was performed using the standard protocol during bolus administration of intravenous contrast. CONTRAST:  98mL OMNIPAQUE IOHEXOL 350 MG/ML SOLN COMPARISON:  Chest, abdomen and pelvis CT dated 03/15/2021 FINDINGS: CTA CHEST FINDINGS Cardiovascular: Mildly enlarged heart. Normally opacified pulmonary arteries with no pulmonary arterial filling defects seen. Atheromatous calcifications, including the coronary arteries and aorta. Mediastinum/Nodes: Multiple significantly enlarged, confluent, low density the right hilar lymph nodes with vascular encasement with mild progression. This includes a lateral right hilar nodal mass with a short axis diameter of 3.3 cm on image number 41/4. Enlarged left hilar node without significant change, measuring 1.1 cm in short axis diameter on image number 41/4 Enlarged precarinal node with a short axis diameter of 1.3 cm on image number 35/4. Unremarkable esophagus.  Small, poorly visualized thyroid gland. Lungs/Pleura: Irregular right upper lobe mass measuring 5.1 x 3.2 cm on image number 39/11. This previously measured 4.4 x 2.3 cm in corresponding dimensions. 3 mm nodule more inferiorly in the outer right upper lobe on image number 47/11, without significant change. Four additional smaller nodules in the superior aspect of the right middle lobe, without significant change. Mild bilateral centrilobular bullous changes without significant change. Mild peripheral eccentric or a shin of the interstitial markings in both lungs without significant change. No pleural fluid.  Musculoskeletal: Lytic and sclerotic changes involving the T8 vertebral body with interval 50% collapse of the vertebral body and soft tissue extending into the anterior aspect of the spinal canal, causing moderate canal stenosis. There is also mild diffuse paraspinal extension extending superiorly and inferiorly. This is also involving the pedicles and posterior elements bilaterally with a pars fracture on the left. The fracture is nondisplaced. Lytic lesion in the posterior, superior aspect of the T9 vertebral body. Additional lytic lesion involving the inferior aspects of the T9 inferior facets without canal extension. There is a suggestion of poorly defined, lytic areas in additional thoracic vertebral bodies. Stable old, mild T11 and T12 superior endplate compression deformities. Thoracic and lower cervical spine degenerative changes. There is also an oval lytic lesion in the posterior aspect of the left 3rd rib. Review of the MIP images confirms the above findings. CT ABDOMEN and PELVIS FINDINGS Hepatobiliary: Interval dilated gallbladder without visible gallstones or wall thickening. No pericholecystic fluid. Unremarkable liver. Pancreas: Moderate diffuse pancreatic atrophy. Spleen: Normal in size without focal abnormality. Adrenals/Urinary Tract: Adrenal glands are unremarkable. Kidneys are normal, without renal calculi, focal lesion, or hydronephrosis. Bladder is unremarkable. Stomach/Bowel: Stomach is within normal limits. Appendix appears normal. No evidence of bowel wall thickening, distention, or inflammatory changes. Vascular/Lymphatic: Atheromatous arterial calcifications without aneurysm. No enlarged lymph nodes. Reproductive: Uterus and bilateral adnexa are unremarkable. Other: Small umbilical hernia containing fat. Musculoskeletal: Left hip prosthesis with associated streak artifacts. Posterior left iliac bone lytic lesion. Anterior, inferior L2 vertebral body lytic lesion with mild paraspinal  extension. Anterior, superior S1 vertebral lytic lesion. L4 kyphoplasty and small lytic areas. Review of the MIP images confirms the above findings. IMPRESSION: 1.  Right upper lobe lung cancer with metastatic mediastinal and right hilar adenopathy and possible metastatic left hilar adenopathy. 2. Multiple bony metastases with interval collapse of the T8 vertebral body with paraspinal extension of tumor, including into the anterior aspect of the spinal canal, causing moderate canal stenosis. This could progressed to cord compression. 3. Multiple small right lung nodules, suspicious for possible metastases. 4. No pulmonary emboli. 5. No acute abnormality in the abdomen or pelvis other than interval gallbladder dilatation. 6.  Calcific coronary artery and aortic atherosclerosis. Critical Value/emergent results were called by telephone at the time of interpretation on 05/02/2021 at 6:02 pm to provider [facility] , who verbally acknowledged these results. Electronically Signed   By: Claudie Revering M.D.   On: 05/02/2021 18:02      Assessment/PlaN  New lung mass with metastasis to spine  -Collapse of T8 vertebral body with paraspinal extension of tumor causing moderate canal stenosis  -Her primary neurosurgeon Dr. Trenton Gammon was notified by ED physician Dr. Almyra Free.  Recommend MRI of total spine. -PRN Pain control overnight -Needs oncology consult in the morning  AKI on CKD stage IIIa - creatinine of 1.37 on admit with baseline of 1-1.2 - continuous IV fluids   COVID infection -tested positive on 04/22/2021 -Continue airborne and contact precaution - appears asymptomatic at this time  Persistent atrial fibrillation -Continue diltiazem - CH2AD2Vas score of 4- hold Eliquis for now as she likely will need biopsy of lung mass  Hypertension -Continue diltiazem  Type 2 DM - Last HbA1C 6.2 in March   Hypothyroidism -Continue levothyroxine  Admission status: obs   Level of care: Telemetry  Status is:  Observation  The patient remains OBS appropriate and will d/c before 2 midnights.  Dispo: The patient is from: Home              Anticipated d/c is to: Home              Patient currently is not medically stable to d/c.   Difficult to place patient No         Orene Desanctis DO [facility]   If 7PM-7AM, please contact night-coverage [email or web address]   05/02/2021, 7:46 PM

## 2021-05-03 ENCOUNTER — Encounter (HOSPITAL_COMMUNITY): Payer: Self-pay | Admitting: Family Medicine

## 2021-05-03 DIAGNOSIS — R0602 Shortness of breath: Secondary | ICD-10-CM | POA: Diagnosis not present

## 2021-05-03 DIAGNOSIS — M1711 Unilateral primary osteoarthritis, right knee: Secondary | ICD-10-CM | POA: Diagnosis not present

## 2021-05-03 DIAGNOSIS — I517 Cardiomegaly: Secondary | ICD-10-CM | POA: Diagnosis not present

## 2021-05-03 DIAGNOSIS — M4804 Spinal stenosis, thoracic region: Secondary | ICD-10-CM | POA: Diagnosis present

## 2021-05-03 DIAGNOSIS — D638 Anemia in other chronic diseases classified elsewhere: Secondary | ICD-10-CM | POA: Diagnosis not present

## 2021-05-03 DIAGNOSIS — U071 COVID-19: Secondary | ICD-10-CM | POA: Diagnosis present

## 2021-05-03 DIAGNOSIS — E039 Hypothyroidism, unspecified: Secondary | ICD-10-CM | POA: Diagnosis not present

## 2021-05-03 DIAGNOSIS — C801 Malignant (primary) neoplasm, unspecified: Secondary | ICD-10-CM | POA: Diagnosis not present

## 2021-05-03 DIAGNOSIS — J1282 Pneumonia due to coronavirus disease 2019: Secondary | ICD-10-CM | POA: Diagnosis present

## 2021-05-03 DIAGNOSIS — S82852A Displaced trimalleolar fracture of left lower leg, initial encounter for closed fracture: Secondary | ICD-10-CM | POA: Diagnosis not present

## 2021-05-03 DIAGNOSIS — Z66 Do not resuscitate: Secondary | ICD-10-CM | POA: Diagnosis not present

## 2021-05-03 DIAGNOSIS — E1122 Type 2 diabetes mellitus with diabetic chronic kidney disease: Secondary | ICD-10-CM | POA: Diagnosis present

## 2021-05-03 DIAGNOSIS — C7951 Secondary malignant neoplasm of bone: Secondary | ICD-10-CM | POA: Diagnosis not present

## 2021-05-03 DIAGNOSIS — I129 Hypertensive chronic kidney disease with stage 1 through stage 4 chronic kidney disease, or unspecified chronic kidney disease: Secondary | ICD-10-CM | POA: Diagnosis present

## 2021-05-03 DIAGNOSIS — N1831 Chronic kidney disease, stage 3a: Secondary | ICD-10-CM | POA: Diagnosis present

## 2021-05-03 DIAGNOSIS — G629 Polyneuropathy, unspecified: Secondary | ICD-10-CM | POA: Diagnosis not present

## 2021-05-03 DIAGNOSIS — C799 Secondary malignant neoplasm of unspecified site: Secondary | ICD-10-CM | POA: Diagnosis not present

## 2021-05-03 DIAGNOSIS — C3490 Malignant neoplasm of unspecified part of unspecified bronchus or lung: Secondary | ICD-10-CM | POA: Diagnosis not present

## 2021-05-03 DIAGNOSIS — N179 Acute kidney failure, unspecified: Secondary | ICD-10-CM | POA: Diagnosis not present

## 2021-05-03 DIAGNOSIS — R918 Other nonspecific abnormal finding of lung field: Secondary | ICD-10-CM | POA: Diagnosis not present

## 2021-05-03 DIAGNOSIS — E1165 Type 2 diabetes mellitus with hyperglycemia: Secondary | ICD-10-CM | POA: Diagnosis present

## 2021-05-03 DIAGNOSIS — L89220 Pressure ulcer of left hip, unstageable: Secondary | ICD-10-CM | POA: Diagnosis present

## 2021-05-03 DIAGNOSIS — R590 Localized enlarged lymph nodes: Secondary | ICD-10-CM | POA: Diagnosis not present

## 2021-05-03 DIAGNOSIS — Z515 Encounter for palliative care: Secondary | ICD-10-CM | POA: Diagnosis not present

## 2021-05-03 DIAGNOSIS — J188 Other pneumonia, unspecified organism: Secondary | ICD-10-CM | POA: Diagnosis present

## 2021-05-03 DIAGNOSIS — I4821 Permanent atrial fibrillation: Secondary | ICD-10-CM | POA: Diagnosis not present

## 2021-05-03 DIAGNOSIS — C771 Secondary and unspecified malignant neoplasm of intrathoracic lymph nodes: Secondary | ICD-10-CM | POA: Diagnosis not present

## 2021-05-03 DIAGNOSIS — I2510 Atherosclerotic heart disease of native coronary artery without angina pectoris: Secondary | ICD-10-CM | POA: Diagnosis present

## 2021-05-03 DIAGNOSIS — J90 Pleural effusion, not elsewhere classified: Secondary | ICD-10-CM | POA: Diagnosis not present

## 2021-05-03 DIAGNOSIS — D8481 Immunodeficiency due to conditions classified elsewhere: Secondary | ICD-10-CM | POA: Diagnosis not present

## 2021-05-03 DIAGNOSIS — M8458XA Pathological fracture in neoplastic disease, other specified site, initial encounter for fracture: Secondary | ICD-10-CM | POA: Diagnosis not present

## 2021-05-03 DIAGNOSIS — J9811 Atelectasis: Secondary | ICD-10-CM | POA: Diagnosis not present

## 2021-05-03 DIAGNOSIS — C781 Secondary malignant neoplasm of mediastinum: Secondary | ICD-10-CM | POA: Diagnosis present

## 2021-05-03 DIAGNOSIS — C3411 Malignant neoplasm of upper lobe, right bronchus or lung: Secondary | ICD-10-CM | POA: Diagnosis not present

## 2021-05-03 DIAGNOSIS — G4733 Obstructive sleep apnea (adult) (pediatric): Secondary | ICD-10-CM | POA: Diagnosis not present

## 2021-05-03 DIAGNOSIS — J432 Centrilobular emphysema: Secondary | ICD-10-CM | POA: Diagnosis present

## 2021-05-03 DIAGNOSIS — J9601 Acute respiratory failure with hypoxia: Secondary | ICD-10-CM | POA: Diagnosis not present

## 2021-05-03 DIAGNOSIS — Z7189 Other specified counseling: Secondary | ICD-10-CM | POA: Diagnosis not present

## 2021-05-03 DIAGNOSIS — R531 Weakness: Secondary | ICD-10-CM | POA: Diagnosis not present

## 2021-05-03 DIAGNOSIS — E669 Obesity, unspecified: Secondary | ICD-10-CM | POA: Diagnosis present

## 2021-05-03 LAB — URINE CULTURE: Culture: 60000 — AB

## 2021-05-03 LAB — CBC
HCT: 35.6 % — ABNORMAL LOW (ref 36.0–46.0)
Hemoglobin: 10.9 g/dL — ABNORMAL LOW (ref 12.0–15.0)
MCH: 27 pg (ref 26.0–34.0)
MCHC: 30.6 g/dL (ref 30.0–36.0)
MCV: 88.3 fL (ref 80.0–100.0)
Platelets: 261 10*3/uL (ref 150–400)
RBC: 4.03 MIL/uL (ref 3.87–5.11)
RDW: 15.8 % — ABNORMAL HIGH (ref 11.5–15.5)
WBC: 17.3 10*3/uL — ABNORMAL HIGH (ref 4.0–10.5)
nRBC: 0 % (ref 0.0–0.2)

## 2021-05-03 LAB — BASIC METABOLIC PANEL
Anion gap: 11 (ref 5–15)
BUN: 15 mg/dL (ref 8–23)
CO2: 23 mmol/L (ref 22–32)
Calcium: 9.6 mg/dL (ref 8.9–10.3)
Chloride: 104 mmol/L (ref 98–111)
Creatinine, Ser: 1.16 mg/dL — ABNORMAL HIGH (ref 0.44–1.00)
GFR, Estimated: 50 mL/min — ABNORMAL LOW (ref >60)
Glucose, Bld: 85 mg/dL (ref 70–99)
Potassium: 3.7 mmol/L (ref 3.5–5.1)
Sodium: 138 mmol/L (ref 135–145)

## 2021-05-03 MED ORDER — SODIUM CHLORIDE 0.9 % IV SOLN
2.0000 g | INTRAVENOUS | Status: DC
  Administered 2021-05-03 – 2021-05-05 (×3): 2 g via INTRAVENOUS
  Filled 2021-05-03 (×2): qty 20
  Filled 2021-05-03 (×2): qty 2

## 2021-05-03 MED ORDER — AZITHROMYCIN 250 MG PO TABS
500.0000 mg | ORAL_TABLET | Freq: Every day | ORAL | Status: DC
  Administered 2021-05-03 – 2021-05-06 (×4): 500 mg via ORAL
  Filled 2021-05-03 (×4): qty 2

## 2021-05-03 MED ORDER — SENNOSIDES-DOCUSATE SODIUM 8.6-50 MG PO TABS
1.0000 | ORAL_TABLET | Freq: Two times a day (BID) | ORAL | Status: DC
  Administered 2021-05-03 – 2021-05-15 (×25): 1 via ORAL
  Filled 2021-05-03 (×26): qty 1

## 2021-05-03 MED ORDER — SODIUM CHLORIDE 0.9 % IV SOLN
3.0000 g | Freq: Four times a day (QID) | INTRAVENOUS | Status: DC
  Filled 2021-05-03: qty 8

## 2021-05-03 MED ORDER — ENSURE ENLIVE PO LIQD
237.0000 mL | Freq: Two times a day (BID) | ORAL | Status: DC
  Administered 2021-05-03 – 2021-05-15 (×17): 237 mL via ORAL

## 2021-05-03 NOTE — Progress Notes (Signed)
Pharmacy Antibiotic Note  [name] is a 70 y.o. female admitted on 05/02/2021.  Pharmacy has been consulted for Unasyn dosing for CAP, post obstructive pneumonia.  Plan: Unasyn 3g IV q6h Follow up renal function, culture results, and clinical course.    Height: 5\' 3"  (160 cm) Weight: 84.4 kg (186 lb) IBW/kg (Calculated) : 52.4  Temp (24hrs), Avg:98.2 F (36.8 C), Min:97.7 F (36.5 C), Max:98.6 F (37 C)  Recent Labs  Lab 05/02/21 1450 05/02/21 1621 05/02/21 2123 05/03/21 0400 05/03/21 0813  WBC 18.7*  --   --   --  17.3*  CREATININE 1.37*  --  1.18* 1.16*  --   LATICACIDVEN 2.0* 1.5  --   --   --     Estimated Creatinine Clearance: 45.1 mL/min (A) (by C-G formula based on SCr of 1.16 mg/dL (H)).    Allergies  Allergen Reactions  . Penicillins Itching    Has patient had a PCN reaction causing immediate rash, facial/tongue/throat swelling, SOB or lightheadedness with hypotension: Yes Has patient had a PCN reaction causing severe rash involving mucus membranes or skin necrosis: No Has patient had a PCN reaction that required hospitalization: No Has patient had a PCN reaction occurring within the last 10 years: Yes If all of the above answers are "NO", then may proceed with Cephalosporin use.   Marland Kitchen Percocet [Oxycodone-Acetaminophen] Itching    Antimicrobials this admission: 5/7 Zosyn x1 5/8 Unasyn >>   Dose adjustments this admission:  Microbiology results: 5/7 BCx: ngtd 5/7 UCx:   Thank you for allowing pharmacy to be a part of this patient's care.  Gretta Arab PharmD, BCPS Clinical Pharmacist [facility] main pharmacy [phone] 05/03/2021 11:30 AM

## 2021-05-03 NOTE — Progress Notes (Signed)
Pt's husband updated on the phone about the visiting policy for positive COVID pts. that only 1 person can visit during the hospitalization.

## 2021-05-03 NOTE — Plan of Care (Signed)

## 2021-05-03 NOTE — Progress Notes (Addendum)
[facility] Progress Note  Patient: [name]    IOE:[id number]  DOA: 05/02/2021     Date of Service: the patient was seen and examined on 05/03/2021  Brief hospital course: Past medical history of chronic A. fib on Eliquis, bladder cancer, HTN, CAD, OSA, hypothyroidism, CKD 3A, type II DM, COVID infection.  Presents with complaints over biopsing back pain. Appears to have postobstructive pneumonia as well as compression fracture on T8 Also has a lung mass that requires biopsy Currently plan is treat postoperative pneumonia.  Assessment and Plan: 1.  Postobstructive pneumonia. Identified to have a lung mass. Leukocytosis as well as cough also present. Hypoxic, currently does not use any oxygen at her baseline. Blood cultures currently negative. Lung mass appears to have increased significantly in size and therefore suspected to have postobstructive pneumonia and therefore we will stay tomorrow with IV antibiotics.  2.  Lung mass, right upper lobe with metastasis CT scan shows right upper lobe lung mass with metastatic mediastinal and right hilar adenopathy as well as bony metastasis. Recently treated for L4 compression fracture and appears to have T8 compression fracture. CT scan negative for PE. The mass appears to have increased in size over few weeks. Patient requires bronchoscopy and biopsy. We will consult pulmonary. Likely not a candidate for biopsy until her COVID isolation is completed.  3.  COVID-19 infection Reportedly first positive test on 04/22/2021. Treated with monupiravir, 4/28-5/3. Currently while the patient does have worsening hypoxia and bilateral crackles CT scan negative for any worsening infiltrate bilaterally and therefore no further therapy for now. CT value 34.2  4.  T8 compression fracture Seen on CT scan. Patient presented with worsening pain but Likely pathological fracture. Discussed with neurosurgery, currently does not think that the  patient is a good candidate for intervention.  Recommend outpatient follow-up. Patient does not appear to have any radicular finding although reports left leg numbness.  5.  Persistent A. fib with RVR Continue Cardizem, continue to hold Eliquis in case pulmonary performed biopsy  6.  Type 2 diabetes mellitus, uncontrolled with hyperglycemia Hemoglobin A1c 6.2. Continue to monitor with sliding scale insulin.  7.  Hypothyroidism  continue Synthroid.  8.  Acute kidney injury on CKD 3A. Baseline serum creatinine 1.05. On presentation serum creatinine 1.37. Currently improving.  Monitor.  9.  Obesity Placing the patient at high risk of poor outcome in the setting of COVID-19 illness.  10.  Hypokalemia Replaced.  Monitor.  11.  Elevated LFT Monitor.  Likely in the setting of infection. Body mass index is 32.95 kg/m.   12.  Left buttock unstageable ulcer, POA Continue foam dressing.  Pressure Injury 05/02/21 Buttocks Left Unstageable - Full thickness tissue loss in which the base of the injury is covered by slough (yellow, tan, gray, green or brown) and/or eschar (tan, brown or black) in the wound bed. black wound bed (Active)  05/02/21 2130  Location: Buttocks  Location Orientation: Left  Staging: Unstageable - Full thickness tissue loss in which the base of the injury is covered by slough (yellow, tan, gray, green or brown) and/or eschar (tan, brown or black) in the wound bed.  Wound Description (Comments): black wound bed  Present on Admission: Yes     Diet: Cardiac diet DVT Prophylaxis:   Place and maintain sequential compression device Start: 05/03/21 1142    Advance goals of care discussion: Full code  Family Communication: family was present at bedside, at the time of interview.  The pt  provided permission to discuss medical plan with the family. Opportunity was given to ask question and all questions were answered satisfactorily.   Disposition:  Status is:  Inpatient  Remains inpatient appropriate because:IV treatments appropriate due to intensity of illness or inability to take PO  Dispo: The patient is from: Home              Anticipated d/c is to: Home              Patient currently is not medically stable to d/c.   Difficult to place patient No  Subjective: To have cough, continues to have back pain.  No nausea no vomiting.  No fever no chills.  No chest pain.  No abdominal pain.  No diarrhea no constipation reported.  Physical Exam: General: Appear in mild distress, no Rash; Oral Mucosa Clear, moist. no Abnormal Neck Mass Or lumps, Conjunctiva normal  Cardiovascular: S1 and S2 Present, no Murmur, Respiratory: increased respiratory effort, Bilateral Air entry present and bilateral  Crackles, no wheezes Abdomen: Bowel Sound present, Soft and no tenderness Extremities: trace Pedal edema Neurology: alert and oriented to time, place, and person affect appropriate. no new focal deficit Gait not checked due to patient safety concerns  Vitals:   05/03/21 0244 05/03/21 0702 05/03/21 0831 05/03/21 1218  BP: (!) 143/79 129/77 (!) 147/76 (!) 142/67  Pulse: (!) 47 (!) 107 85 90  Resp: 20 20 18  (!) 22  Temp: 98.6 F (37 C) 97.9 F (36.6 C) 98.3 F (36.8 C) 98.7 F (37.1 C)  TempSrc: Oral Oral Oral Oral  SpO2: (!) 89% 90% 94% 94%  Weight:      Height:        Intake/Output Summary (Last 24 hours) at 05/03/2021 1658 Last data filed at 05/03/2021 3474 Gross per 24 hour  Intake 579.88 ml  Output 850 ml  Net -270.12 ml   Filed Weights   05/02/21 1347  Weight: 84.4 kg    Data Reviewed: I have personally reviewed and interpreted daily labs, tele strips, imaging. I reviewed all nursing notes, pharmacy notes, vitals, pertinent old records I have discussed plan of care as described above with RN and patient/family.  CBC: Recent Labs  Lab 05/02/21 1450 05/03/21 0813  WBC 18.7* 17.3*  NEUTROABS 15.3*  --   HGB 12.1 10.9*  HCT 37.7  35.6*  MCV 84.9 88.3  PLT 331 259   Basic Metabolic Panel: Recent Labs  Lab 05/02/21 1450 05/02/21 2123 05/03/21 0400  NA 136 134* 138  K 3.0* 3.3* 3.7  CL 94* 99 104  CO2 26 23 23   GLUCOSE 133* 106* 85  BUN 20 16 15   CREATININE 1.37* 1.18* 1.16*  CALCIUM 10.7* 9.4 9.6  MG 2.0  --   --     Studies: CT Angio Chest PE W/Cm &/Or Wo Cm  Result Date: 05/02/2021 CLINICAL DATA:  Abdominal pain and acute, non localized lower to mid back pain. Back surgery 3 weeks ago. Recently diagnosed COVID-19. EXAM: CT ANGIOGRAPHY CHEST CT ABDOMEN AND PELVIS WITH CONTRAST TECHNIQUE: Multidetector CT imaging of the chest was performed using the standard protocol during bolus administration of intravenous contrast. Multiplanar CT image reconstructions and MIPs were obtained to evaluate the vascular anatomy. Multidetector CT imaging of the abdomen and pelvis was performed using the standard protocol during bolus administration of intravenous contrast. CONTRAST:  72mL OMNIPAQUE IOHEXOL 350 MG/ML SOLN COMPARISON:  Chest, abdomen and pelvis CT dated 03/15/2021 FINDINGS: CTA CHEST FINDINGS Cardiovascular: Mildly  enlarged heart. Normally opacified pulmonary arteries with no pulmonary arterial filling defects seen. Atheromatous calcifications, including the coronary arteries and aorta. Mediastinum/Nodes: Multiple significantly enlarged, confluent, low density the right hilar lymph nodes with vascular encasement with mild progression. This includes a lateral right hilar nodal mass with a short axis diameter of 3.3 cm on image number 41/4. Enlarged left hilar node without significant change, measuring 1.1 cm in short axis diameter on image number 41/4 Enlarged precarinal node with a short axis diameter of 1.3 cm on image number 35/4. Unremarkable esophagus.  Small, poorly visualized thyroid gland. Lungs/Pleura: Irregular right upper lobe mass measuring 5.1 x 3.2 cm on image number 39/11. This previously measured 4.4 x 2.3 cm  in corresponding dimensions. 3 mm nodule more inferiorly in the outer right upper lobe on image number 47/11, without significant change. Four additional smaller nodules in the superior aspect of the right middle lobe, without significant change. Mild bilateral centrilobular bullous changes without significant change. Mild peripheral eccentric or a shin of the interstitial markings in both lungs without significant change. No pleural fluid. Musculoskeletal: Lytic and sclerotic changes involving the T8 vertebral body with interval 50% collapse of the vertebral body and soft tissue extending into the anterior aspect of the spinal canal, causing moderate canal stenosis. There is also mild diffuse paraspinal extension extending superiorly and inferiorly. This is also involving the pedicles and posterior elements bilaterally with a pars fracture on the left. The fracture is nondisplaced. Lytic lesion in the posterior, superior aspect of the T9 vertebral body. Additional lytic lesion involving the inferior aspects of the T9 inferior facets without canal extension. There is a suggestion of poorly defined, lytic areas in additional thoracic vertebral bodies. Stable old, mild T11 and T12 superior endplate compression deformities. Thoracic and lower cervical spine degenerative changes. There is also an oval lytic lesion in the posterior aspect of the left 3rd rib. Review of the MIP images confirms the above findings. CT ABDOMEN and PELVIS FINDINGS Hepatobiliary: Interval dilated gallbladder without visible gallstones or wall thickening. No pericholecystic fluid. Unremarkable liver. Pancreas: Moderate diffuse pancreatic atrophy. Spleen: Normal in size without focal abnormality. Adrenals/Urinary Tract: Adrenal glands are unremarkable. Kidneys are normal, without renal calculi, focal lesion, or hydronephrosis. Bladder is unremarkable. Stomach/Bowel: Stomach is within normal limits. Appendix appears normal. No evidence of bowel  wall thickening, distention, or inflammatory changes. Vascular/Lymphatic: Atheromatous arterial calcifications without aneurysm. No enlarged lymph nodes. Reproductive: Uterus and bilateral adnexa are unremarkable. Other: Small umbilical hernia containing fat. Musculoskeletal: Left hip prosthesis with associated streak artifacts. Posterior left iliac bone lytic lesion. Anterior, inferior L2 vertebral body lytic lesion with mild paraspinal extension. Anterior, superior S1 vertebral lytic lesion. L4 kyphoplasty and small lytic areas. Review of the MIP images confirms the above findings. IMPRESSION: 1. Right upper lobe lung cancer with metastatic mediastinal and right hilar adenopathy and possible metastatic left hilar adenopathy. 2. Multiple bony metastases with interval collapse of the T8 vertebral body with paraspinal extension of tumor, including into the anterior aspect of the spinal canal, causing moderate canal stenosis. This could progressed to cord compression. 3. Multiple small right lung nodules, suspicious for possible metastases. 4. No pulmonary emboli. 5. No acute abnormality in the abdomen or pelvis other than interval gallbladder dilatation. 6.  Calcific coronary artery and aortic atherosclerosis. Critical Value/emergent results were called by telephone at the time of interpretation on 05/02/2021 at 6:02 pm to provider [facility] , who verbally acknowledged these results. Electronically Signed   By:  Claudie Revering M.D.   On: 05/02/2021 18:02   CT Abdomen Pelvis W Contrast  Result Date: 05/02/2021 CLINICAL DATA:  Abdominal pain and acute, non localized lower to mid back pain. Back surgery 3 weeks ago. Recently diagnosed COVID-19. EXAM: CT ANGIOGRAPHY CHEST CT ABDOMEN AND PELVIS WITH CONTRAST TECHNIQUE: Multidetector CT imaging of the chest was performed using the standard protocol during bolus administration of intravenous contrast. Multiplanar CT image reconstructions and MIPs were obtained to evaluate  the vascular anatomy. Multidetector CT imaging of the abdomen and pelvis was performed using the standard protocol during bolus administration of intravenous contrast. CONTRAST:  77mL OMNIPAQUE IOHEXOL 350 MG/ML SOLN COMPARISON:  Chest, abdomen and pelvis CT dated 03/15/2021 FINDINGS: CTA CHEST FINDINGS Cardiovascular: Mildly enlarged heart. Normally opacified pulmonary arteries with no pulmonary arterial filling defects seen. Atheromatous calcifications, including the coronary arteries and aorta. Mediastinum/Nodes: Multiple significantly enlarged, confluent, low density the right hilar lymph nodes with vascular encasement with mild progression. This includes a lateral right hilar nodal mass with a short axis diameter of 3.3 cm on image number 41/4. Enlarged left hilar node without significant change, measuring 1.1 cm in short axis diameter on image number 41/4 Enlarged precarinal node with a short axis diameter of 1.3 cm on image number 35/4. Unremarkable esophagus.  Small, poorly visualized thyroid gland. Lungs/Pleura: Irregular right upper lobe mass measuring 5.1 x 3.2 cm on image number 39/11. This previously measured 4.4 x 2.3 cm in corresponding dimensions. 3 mm nodule more inferiorly in the outer right upper lobe on image number 47/11, without significant change. Four additional smaller nodules in the superior aspect of the right middle lobe, without significant change. Mild bilateral centrilobular bullous changes without significant change. Mild peripheral eccentric or a shin of the interstitial markings in both lungs without significant change. No pleural fluid. Musculoskeletal: Lytic and sclerotic changes involving the T8 vertebral body with interval 50% collapse of the vertebral body and soft tissue extending into the anterior aspect of the spinal canal, causing moderate canal stenosis. There is also mild diffuse paraspinal extension extending superiorly and inferiorly. This is also involving the pedicles  and posterior elements bilaterally with a pars fracture on the left. The fracture is nondisplaced. Lytic lesion in the posterior, superior aspect of the T9 vertebral body. Additional lytic lesion involving the inferior aspects of the T9 inferior facets without canal extension. There is a suggestion of poorly defined, lytic areas in additional thoracic vertebral bodies. Stable old, mild T11 and T12 superior endplate compression deformities. Thoracic and lower cervical spine degenerative changes. There is also an oval lytic lesion in the posterior aspect of the left 3rd rib. Review of the MIP images confirms the above findings. CT ABDOMEN and PELVIS FINDINGS Hepatobiliary: Interval dilated gallbladder without visible gallstones or wall thickening. No pericholecystic fluid. Unremarkable liver. Pancreas: Moderate diffuse pancreatic atrophy. Spleen: Normal in size without focal abnormality. Adrenals/Urinary Tract: Adrenal glands are unremarkable. Kidneys are normal, without renal calculi, focal lesion, or hydronephrosis. Bladder is unremarkable. Stomach/Bowel: Stomach is within normal limits. Appendix appears normal. No evidence of bowel wall thickening, distention, or inflammatory changes. Vascular/Lymphatic: Atheromatous arterial calcifications without aneurysm. No enlarged lymph nodes. Reproductive: Uterus and bilateral adnexa are unremarkable. Other: Small umbilical hernia containing fat. Musculoskeletal: Left hip prosthesis with associated streak artifacts. Posterior left iliac bone lytic lesion. Anterior, inferior L2 vertebral body lytic lesion with mild paraspinal extension. Anterior, superior S1 vertebral lytic lesion. L4 kyphoplasty and small lytic areas. Review of the MIP images confirms  the above findings. IMPRESSION: 1. Right upper lobe lung cancer with metastatic mediastinal and right hilar adenopathy and possible metastatic left hilar adenopathy. 2. Multiple bony metastases with interval collapse of the T8  vertebral body with paraspinal extension of tumor, including into the anterior aspect of the spinal canal, causing moderate canal stenosis. This could progressed to cord compression. 3. Multiple small right lung nodules, suspicious for possible metastases. 4. No pulmonary emboli. 5. No acute abnormality in the abdomen or pelvis other than interval gallbladder dilatation. 6.  Calcific coronary artery and aortic atherosclerosis. Critical Value/emergent results were called by telephone at the time of interpretation on 05/02/2021 at 6:02 pm to provider [facility] , who verbally acknowledged these results. Electronically Signed   By: Claudie Revering M.D.   On: 05/02/2021 18:02   MR TOTAL SPINE METS SCREENING  Result Date: 05/02/2021 CLINICAL DATA:  Metastatic disease with T8 fracture EXAM: MRI TOTAL SPINE WITHOUT AND WITH CONTRAST TECHNIQUE: Multisequence MR imaging of the spine from the cervical spine to the sacrum was performed prior to and following IV contrast administration for evaluation of spinal metastatic disease. CONTRAST:  70mL GADAVIST GADOBUTROL 1 MMOL/ML IV SOLN COMPARISON:  None. FINDINGS: The examination is severely degraded by motion. Additionally, there are substantial technical artifacts affecting the scan that are of unclear etiology. MRI CERVICAL SPINE FINDINGS Alignment: Normal Vertebrae: No fracture, evidence of discitis, or bone lesion. Cord: Limited visualization of the spinal cord due to motion. Question some hyperintense T2-weighted signal in the lower cervical spinal cord. Posterior Fossa, vertebral arteries, paraspinal tissues: Unremarkable Disc levels: There is multilevel degenerative disc disease with mild spinal canal stenosis at C3-4, C4-5 and C5-6. MRI THORACIC SPINE FINDINGS Alignment:  Physiologic. Vertebrae: Wedge compression fracture of T8 with approximately 25% height loss. There is also abnormal signal extending into the posterior elements of T8. Signal changes at the inferior T7 and  superior T9 endplates are likely due to edema. Cord:  Normal signal. Paraspinal and other soft tissues: 15 mm nodule in the right upper lobe. Disc levels: There is at least moderate spinal canal stenosis at the T8 level. Mild deformity of the spinal cord without signal change. MRI LUMBAR SPINE FINDINGS Segmentation:  Standard Alignment:  Third grade 1 anterolisthesis at L4-5 Vertebrae: Abnormal signal in the L2, L4 and S1 bodies compatible with metastatic disease. Conus medullaris: Extends to the L1 level and appears normal. Paraspinal and other soft tissues: Negative Disc levels: No lumbar spinal canal stenosis. Mild bilateral L4 neural foraminal stenosis. IMPRESSION: 1. Severely motion degraded examination. 2. Wedge compression fracture of T8 with approximately 25% height loss. Abnormal signal extending into the posterior elements of T8 suggests a pathologic fracture. 3. At least moderate spinal canal stenosis at the T8 level with mild deformity of the spinal cord. 4. Abnormal signal in the L2, L4 and S1 bodies compatible with metastatic disease. 5. Multilevel cervical degenerative disc disease with mild spinal canal stenosis at C3-4, C4-5 and C5-6. Electronically Signed   By: Ulyses Jarred M.D.   On: 05/02/2021 21:29    Scheduled Meds: . azithromycin  500 mg Oral Daily  . diltiazem  240 mg Oral QHS  . feeding supplement  237 mL Oral BID BM  . levothyroxine  100 mcg Oral Q0600  . rosuvastatin  40 mg Oral QHS  . senna-docusate  1 tablet Oral BID   Continuous Infusions: . cefTRIAXone (ROCEPHIN)  IV 2 g (05/03/21 1426)   PRN Meds: acetaminophen, HYDROcodone-acetaminophen, morphine injection  Time spent: 35 minutes  Author: Berle Mull, MD [facility] 05/03/2021 4:58 PM  To reach On-call, see care teams to locate the attending and reach out via [email or web address]. Between 7PM-7AM, please contact night-coverage If you still have difficulty reaching the attending provider, please page the [facility]  (Director on Call) for [facility] on amion for assistance.

## 2021-05-04 DIAGNOSIS — C7951 Secondary malignant neoplasm of bone: Secondary | ICD-10-CM

## 2021-05-04 DIAGNOSIS — C3490 Malignant neoplasm of unspecified part of unspecified bronchus or lung: Secondary | ICD-10-CM

## 2021-05-04 MED ORDER — DEXAMETHASONE SODIUM PHOSPHATE 10 MG/ML IJ SOLN
10.0000 mg | Freq: Once | INTRAMUSCULAR | Status: AC
  Administered 2021-05-04: 10 mg via INTRAVENOUS
  Filled 2021-05-04: qty 1

## 2021-05-04 MED ORDER — PROSOURCE PLUS PO LIQD
30.0000 mL | Freq: Two times a day (BID) | ORAL | Status: DC
  Administered 2021-05-04 – 2021-05-15 (×24): 30 mL via ORAL
  Filled 2021-05-04 (×23): qty 30

## 2021-05-04 MED ORDER — ZINC SULFATE 220 (50 ZN) MG PO CAPS
220.0000 mg | ORAL_CAPSULE | Freq: Every day | ORAL | Status: DC
  Administered 2021-05-04 – 2021-05-15 (×12): 220 mg via ORAL
  Filled 2021-05-04 (×12): qty 1

## 2021-05-04 MED ORDER — JUVEN PO PACK
1.0000 | PACK | Freq: Two times a day (BID) | ORAL | Status: DC
  Administered 2021-05-05 – 2021-05-15 (×15): 1 via ORAL
  Filled 2021-05-04 (×28): qty 1

## 2021-05-04 MED ORDER — ASCORBIC ACID 500 MG PO TABS
500.0000 mg | ORAL_TABLET | Freq: Every day | ORAL | Status: DC
  Administered 2021-05-04 – 2021-05-15 (×12): 500 mg via ORAL
  Filled 2021-05-04 (×12): qty 1

## 2021-05-04 MED ORDER — DEXAMETHASONE SODIUM PHOSPHATE 4 MG/ML IJ SOLN
4.0000 mg | Freq: Four times a day (QID) | INTRAMUSCULAR | Status: DC
  Administered 2021-05-05 – 2021-05-09 (×19): 4 mg via INTRAVENOUS
  Filled 2021-05-04 (×19): qty 1

## 2021-05-04 MED ORDER — DILTIAZEM HCL ER COATED BEADS 240 MG PO CP24
240.0000 mg | ORAL_CAPSULE | Freq: Every day | ORAL | Status: DC
  Administered 2021-05-04: 240 mg via ORAL
  Filled 2021-05-04: qty 1

## 2021-05-04 MED ORDER — OXYCODONE HCL 5 MG PO TABS
5.0000 mg | ORAL_TABLET | ORAL | Status: DC | PRN
  Administered 2021-05-04 – 2021-05-13 (×37): 5 mg via ORAL
  Filled 2021-05-04 (×37): qty 1

## 2021-05-04 NOTE — Evaluation (Signed)
Physical Therapy Evaluation Patient Details Name: [name] MRN: [id number] DOB: 1951/11/06 Today's Date: 05/04/2021   History of Present Illness  70 yr old female former smoker with persistent complaints of back pain was in ER on 03/15/21.  She had CT chest then that showed 2.8 x 2.4 cm rounded lesion in Rt upper lobe.  Also noted to have changes of centrilobular emphysema.  She was discharged from ER on levaquin and pain medication.  Positive for COVID 19 on 04/22/21.  She had outpatient treatment with monupiravir.  She presented to hospital again on 05/02/21 with worsening back pain. CT chest imaging from 05/02/21 showed increased mediastinal adenopathy and Rt upper lobe lesion now measuring 5.1 x 3.2 cm.  MRI: compression fracture of T8 suggestive of pathologic fracture, moderate spinal canal stenosis, metastatic lesions in L2/L4/S1  Clinical Impression  Pt admitted with above diagnosis.  Pt currently with functional limitations due to the deficits listed below (see PT Problem List). Pt will benefit from skilled PT to increase their independence and safety with mobility to allow discharge to the venue listed below.  Pt reports having a back procedure in April and was requiring more assist at home from daughter for ADLs and mobility.  Pt reports barely being able to get OOB to [facility] and required daughter's assist.  Pt agreeable to SNF upon d/c as she believes her family is not able to physically assist her any longer.  Pt currently on 4L O2 [place] and SPO2 93% with transfer.  Pt does not wear oxygen at baseline.      Follow Up Recommendations SNF    Equipment Recommendations  None recommended by PT    Recommendations for Other Services       Precautions / Restrictions Precautions Precautions: Fall;Back      Mobility  Bed Mobility Overal bed mobility: Needs Assistance Bed Mobility: Rolling;Sidelying to Sit Rolling: Max assist Sidelying to sit: Max assist       General bed mobility  comments: cues for log roll technique, assist to perform roll with pt reaching for bed rail, assist for upper and lower body to sit upright    Transfers Overall transfer level: Needs assistance Equipment used: 2 person hand held assist Transfers: Sit to/from [employer] Transfers Sit to Stand: Mod assist Stand pivot transfers: Mod assist       General transfer comment: assist to rise and steady, small steps over to recliner, 2 HHA since no walker in room, RN assisted, SpO2 93% on 4L O2   Ambulation/Gait                Stairs            Wheelchair Mobility    Modified Rankin (Stroke Patients Only)       Balance Overall balance assessment: Needs assistance             Standing balance comment: reliant on UE support however increased back pain                             Pertinent Vitals/Pain Pain Assessment: 0-10 Pain Score: 4  Pain Location: mid back Pain Descriptors / Indicators: Aching;Dull Pain Intervention(s): Repositioned;Monitored during session;Premedicated before session    Home Living Family/patient expects to be discharged to:: Private residence Living Arrangements: Spouse/significant other;Other relatives Available Help at Discharge: Family Type of Home: House       Home Layout: One level Home Equipment: Shower  seat;Toilet riser;Walker - 4 wheels      Prior Function Level of Independence: Needs assistance   Gait / Transfers Assistance Needed: has required more physical assist after back surgery recently  ADL's / Homemaking Assistance Needed: daughter is helping        Hand Dominance        Extremity/Trunk Assessment        Lower Extremity Assessment Lower Extremity Assessment: Generalized weakness       Communication   Communication: No difficulties  Cognition Arousal/Alertness: Awake/alert Behavior During Therapy: WFL for tasks assessed/performed Overall Cognitive Status: Within Functional  Limits for tasks assessed                                        General Comments      Exercises     Assessment/Plan    PT Assessment Patient needs continued PT services  PT Problem List Decreased strength;Decreased mobility;Decreased activity tolerance;Decreased balance;Decreased knowledge of use of DME;Pain;Decreased knowledge of precautions       PT Treatment Interventions DME instruction;Gait training;Balance training;Therapeutic exercise;Functional mobility training;Therapeutic activities;Patient/family education    PT Goals (Current goals can be found in the Care Plan section)  Acute Rehab PT Goals PT Goal Formulation: With patient Time For Goal Achievement: 05/18/21 Potential to Achieve Goals: Good    Frequency Min 2X/week   Barriers to discharge        Co-evaluation               AM-PAC PT "6 Clicks" Mobility  Outcome Measure Help needed turning from your back to your side while in a flat bed without using bedrails?: Total Help needed moving from lying on your back to sitting on the side of a flat bed without using bedrails?: Total Help needed moving to and from a bed to a chair (including a wheelchair)?: Total Help needed standing up from a chair using your arms (e.g., wheelchair or bedside chair)?: Total Help needed to walk in hospital room?: Total Help needed climbing 3-5 steps with a railing? : Total 6 Click Score: 6    End of Session Equipment Utilized During Treatment: Oxygen Activity Tolerance: Patient limited by pain Patient left: in chair;with call bell/phone within reach;with chair alarm set Nurse Communication: Mobility status PT Visit Diagnosis: Difficulty in walking, not elsewhere classified (R26.2);Muscle weakness (generalized) (M62.81)    Time: 1610-9604 PT Time Calculation (min) (ACUTE ONLY): 18 min   Charges:   PT Evaluation $PT Eval Moderate Complexity: 1 Mod         Kati PT, DPT Acute Rehabilitation  Services Pager: [phone] Office: [phone]  York Ram E 05/04/2021, 3:58 PM

## 2021-05-04 NOTE — Progress Notes (Signed)
[facility] Progress Note  Patient: [name]    HER:[id number]  DOA: 05/02/2021     Date of Service: the patient was seen and examined on 05/04/2021  Brief hospital course: Past medical history of chronic A. fib on Eliquis, bladder cancer, HTN, CAD, OSA, hypothyroidism, CKD 3A, type II DM, COVID infection.  Presents with complaints over biopsing back pain. Appears to have postobstructive pneumonia as well as compression fracture on T8 Also has a lung mass that requires biopsy Currently plan is treat postoperative pneumonia and pain controlled  Assessment and Plan: 1.  Postobstructive pneumonia. Identified to have a lung mass. Leukocytosis as well as cough also present. Hypoxic, currently does not use any oxygen at her baseline. Blood cultures currently negative. Lung mass appears to have increased significantly in size and therefore suspected to have postobstructive pneumonia and continue IV antibiotics  2.  Lung mass, right upper lobe with metastasis CT scan shows right upper lobe lung mass with metastatic mediastinal and right hilar adenopathy as well as bony metastasis. Recently treated for L4 compression fracture and appears to have T8 compression fracture. CT scan negative for PE. The mass appears to have increased in size over few weeks. Patient requires bronchoscopy and biopsy. Appreciate pulmonary consultation.  Bronchoscopy scheduled on 05/07/2021.  3.  COVID-19 infection Reportedly first positive test on 04/22/2021. Treated with monupiravir, 4/28-5/3. Currently while the patient does have worsening hypoxia and bilateral crackles CT scan negative for any worsening infiltrate bilaterally and therefore no further therapy for now. CT value 34.2 Requested husband to provide positive test information to determine isolation  4.  T8 compression fracture Seen on CT scan. Patient presented with worsening pain but Likely pathological fracture. Appreciate neurosurgery  consultation.  Recommend steroids as well as radiation therapy to her spine.  We will consult radiation oncology. Initiating IV Decadron after bolus. Patient does not appear to have any radicular finding although reports left leg numbness.  5.  Persistent A. fib with RVR Continue Cardizem, continue to hold Eliquis in case pulmonary performed biopsy  6.  Type 2 diabetes mellitus, uncontrolled with hyperglycemia Hemoglobin A1c 6.2. Continue to monitor with sliding scale insulin.  7.  Hypothyroidism  continue Synthroid.  8.  Acute kidney injury on CKD 3A. Baseline serum creatinine 1.05. On presentation serum creatinine 1.37. Currently improving.  Monitor.  9.  Obesity Placing the patient at high risk of poor outcome in the setting of COVID-19 illness.  10.  Hypokalemia Replaced.  Monitor.  11.  Elevated LFT Monitor.  Likely in the setting of infection. Body mass index is 32.95 kg/m.   12.  Left buttock unstageable ulcer, POA Continue foam dressing.  Wound care consulted.  Pressure Injury 05/02/21 Hip Left Unstageable - Full thickness tissue loss in which the base of the injury is covered by slough (yellow, tan, gray, green or brown) and/or eschar (tan, brown or black) in the wound bed. black wound bed (Active)  05/02/21 2130  Location: Hip  Location Orientation: Left  Staging: Unstageable - Full thickness tissue loss in which the base of the injury is covered by slough (yellow, tan, gray, green or brown) and/or eschar (tan, brown or black) in the wound bed.  Wound Description (Comments): black wound bed  Present on Admission: Yes     Diet: Cardiac diet DVT Prophylaxis:   Place and maintain sequential compression device Start: 05/03/21 1142    Advance goals of care discussion: Full code  Family Communication: family was present at  bedside, at the time of interview.  The pt provided permission to discuss medical plan with the family. Opportunity was given to ask question  and all questions were answered satisfactorily.   Disposition:  Status is: Inpatient  Remains inpatient appropriate because:IV treatments appropriate due to intensity of illness or inability to take PO  Dispo: The patient is from: Home              Anticipated d/c is to: Home              Patient currently is not medically stable to d/c.   Difficult to place patient No  Subjective: Continues to have cough.  Primary issue is back pain.  Reports left-sided leg weakness.  No numbness.  Physical Exam: General: Appear in mild distress, no Rash; Oral Mucosa Clear, moist. no Abnormal Neck Mass Or lumps, Conjunctiva normal  Cardiovascular: S1 and S2 Present, no Murmur, Respiratory: good respiratory effort, Bilateral Air entry present and CTA, no Crackles, no wheezes Abdomen: Bowel Sound present, Soft and no tenderness Extremities: no Pedal edema Neurology: alert and oriented to time, place, and person affect appropriate.  Reported left-sided weakness.  Continues to have motor weakness on left lower extremity.  No sensory numbness.  Proprioception normal. Gait not checked due to patient safety concerns   Vitals:   05/03/21 2123 05/04/21 0557 05/04/21 1004 05/04/21 1336  BP: 120/66 129/75 123/72 107/73  Pulse: 82   (!) 103  Resp: 18 17  20   Temp: 97.9 F (36.6 C) 97.9 F (36.6 C)  97.7 F (36.5 C)  TempSrc: Oral Oral  Oral  SpO2:  98%  96%  Weight:      Height:        Intake/Output Summary (Last 24 hours) at 05/04/2021 2019 Last data filed at 05/04/2021 1300 Gross per 24 hour  Intake 540 ml  Output 1450 ml  Net -910 ml   Filed Weights   05/02/21 1347  Weight: 84.4 kg    Data Reviewed: I have personally reviewed and interpreted daily labs, tele strips, imaging. I reviewed all nursing notes, pharmacy notes, vitals, pertinent old records I have discussed plan of care as described above with RN and patient/family.  CBC: Recent Labs  Lab 05/02/21 1450 05/03/21 0813  WBC  18.7* 17.3*  NEUTROABS 15.3*  --   HGB 12.1 10.9*  HCT 37.7 35.6*  MCV 84.9 88.3  PLT 331 485   Basic Metabolic Panel: Recent Labs  Lab 05/02/21 1450 05/02/21 2123 05/03/21 0400  NA 136 134* 138  K 3.0* 3.3* 3.7  CL 94* 99 104  CO2 26 23 23   GLUCOSE 133* 106* 85  BUN 20 16 15   CREATININE 1.37* 1.18* 1.16*  CALCIUM 10.7* 9.4 9.6  MG 2.0  --   --     Studies: No results found.  Scheduled Meds: . (feeding supplement) PROSource Plus  30 mL Oral BID BM  . vitamin C  500 mg Oral Daily  . azithromycin  500 mg Oral Daily  . [START ON 05/05/2021] dexamethasone (DECADRON) injection  4 mg Intravenous Q6H  . diltiazem  240 mg Oral Daily  . feeding supplement  237 mL Oral BID BM  . levothyroxine  100 mcg Oral Q0600  . nutrition supplement (JUVEN)  1 packet Oral BID BM  . rosuvastatin  40 mg Oral QHS  . senna-docusate  1 tablet Oral BID  . zinc sulfate  220 mg Oral Daily   Continuous Infusions: . cefTRIAXone (  ROCEPHIN)  IV 2 g (05/04/21 1209)   PRN Meds: acetaminophen, morphine injection, oxyCODONE  Time spent: 35 minutes  Author: Berle Mull, MD [facility] 05/04/2021 8:19 PM  To reach On-call, see care teams to locate the attending and reach out via [email or web address]. Between 7PM-7AM, please contact night-coverage If you still have difficulty reaching the attending provider, please page the [facility] (Director on Call) for [facility] on amion for assistance.

## 2021-05-04 NOTE — H&P (View-Only) (Signed)
NAME:  [name], MRN:  [id number], DOB:  [birth year], LOS: 1 ADMISSION DATE:  05/02/2021, CONSULTATION DATE: 05/04/2021 REFERRING MD:  Dr. Posey Pronto, [facility], CHIEF COMPLAINT:  Abnormal CT chest   History of Present Illness:  70 yr old female former smoker with persistent complaints of back pain was in ER on 03/15/21.  She had CT chest then that showed 2.8 x 2.4 cm rounded lesion in Rt upper lobe.  Also noted to have changes of centrilobular emphysema.  She was discharged from ER on levaquin and pain medication.  Plan was to have repeat CT imaging in 4 weeks as outpt.  She lives with her grandson who tested positive for COVID 19 infection.  She was not having any respiratory symptoms, but her daughter urged her to get tested.  She tested positive for COVID 19 on 04/22/21.  She had outpatient treatment with monupiravir.  She presented to hospital again on 05/02/21 with worsening back pain.  She was found to have thoracic spine compression fracture.  CT chest imaging from 05/02/21 showed increased mediastinal adenopathy and Rt upper lobe lesion now measuring 5.1 x 3.2 cm.  She was started on 4 liters supplemental oxygen and antibiotics.  She reports having recent weight loss, but she can't quantify how much.  Her activity level has been limited by back pain.  Not having cough, wheeze, chest congestion, fever, or hemoptysis.  Pertinent  Medical History  Hypothyroidism, Hypertension, Hyperlipidemia, Colon polyps, CAD, GERD, Diabetes, Bladder tumor, Persistent atrial fibrillation, Arthritis, Obstructive sleep apnea (AHI 62.2 from [facility] on 04/04/18)  Significant Hospital Events:  . 5/07 Admit, start antibiotics  Tests:   CT angio chest 5/07 >> Rt hilar node with vascular encasement, lateral Rt hilar nodal mass 3.3 cm, Lt hilar node 1.1 cm, Precarinal node 1.3 cm, Rt upper lobe mass 5.1 x 3.2 cm, 3 mm RUL nodule, four small nodules in RML, centrilobular emphysema, T8 metastatic lesion   CT abd/pelvis 5/07 >>  diffuse pancreatic atrophy, posterior Lt iliac lytic bone lesion  MRI spine 5/07 >> compression fracture of T8 suggestive of pathologic fracture, moderate spinal canal stenosis, metastatic lesions in L2/L4/S1  Interim History / Subjective:    Objective   .BP 107/73 (BP Location: Left Arm)   Pulse (!) 103   Temp 97.7 F (36.5 C) (Oral)   Resp 20   Ht 5\' 3"  (1.6 m)   Wt 84.4 kg   LMP  (LMP Unknown)   SpO2 96%   BMI 32.95 kg/m    Intake/Output Summary (Last 24 hours) at 05/04/2021 1401 Last data filed at 05/04/2021 0600 Gross per 24 hour  Intake 60 ml  Output 1150 ml  Net -1090 ml   Filed Weights   05/02/21 1347  Weight: 84.4 kg    Examination:  General - alert Eyes - pupils reactive ENT - no sinus tenderness, no stridor Cardiac - regular rate/rhythm, no murmur Chest - equal breath sounds b/l, no wheezing or rales Abdomen - soft, non tender, + bowel sounds Extremities - no cyanosis, clubbing, or edema Skin - no rashes Neuro - normal strength, moves extremities, follows commands Psych - normal mood and behavior Lymphatics - no lymphadenopathy  Assessment & Plan:   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 - discussed CT imaging with her - reviewed options for obtaining tissue sampling - she is agreeable to proceed with bronchoscopy; likely will need EBUS and possibly ENB - have discussed with Dr. Lamonte Sakai and he will arrange for  procedure - risks of procedure detailed as bleeding, infection, pneumothorax, non diagnosis and complications from anesthesia  COVID 19 positive. - seems to be asymptomatic finding  Pneumonia. - antibiotics per primary team  T8 compression fracture with metastatic bone lesions. - pain control per primary team  History of obstructive sleep apnea. - uncertain whether she was using CPAP as outpt  Hx of atrial fibrillation, CAD, HTN, HLD. - not currently on anticoagulation; on eliquis as outpt - followed by Dr. Percival Spanish  with [facility]    CMP Latest Ref Rng & Units 05/03/2021 05/02/2021 05/02/2021  Glucose 70 - 99 mg/dL 85 106(H) 133(H)  BUN 8 - 23 mg/dL 15 16 20   Creatinine 0.44 - 1.00 mg/dL 1.16(H) 1.18(H) 1.37(H)  Sodium 135 - 145 mmol/L 138 134(L) 136  Potassium 3.5 - 5.1 mmol/L 3.7 3.3(L) 3.0(L)  Chloride 98 - 111 mmol/L 104 99 94(L)  CO2 22 - 32 mmol/L 23 23 26   Calcium 8.9 - 10.3 mg/dL 9.6 9.4 10.7(H)  Total Protein 6.5 - 8.1 g/dL - - 8.5(H)  Total Bilirubin 0.3 - 1.2 mg/dL - - 0.6  Alkaline Phos 38 - 126 U/L - - 115  AST 15 - 41 U/L - - 125(H)  ALT 0 - 44 U/L - - 99(H)    CBC Latest Ref Rng & Units 05/03/2021 05/02/2021 03/18/2021  WBC 4.0 - 10.5 K/uL 17.3(H) 18.7(H) 11.2(H)  Hemoglobin 12.0 - 15.0 g/dL 10.9(L) 12.1 11.2(L)  Hematocrit 36.0 - 46.0 % 35.6(L) 37.7 35.3(L)  Platelets 150 - 400 K/uL 261 331 329    CBG (last 3)  No results for input(s): GLUCAP in the last 72 hours.   Lab Results  Component Value Date   INR 1.4 (H) 11/09/2020   INR 2.0 02/28/2017   INR 2.8 01/07/2017    Review of Systems:   Reviewed and negative  Past Medical History:  She,  has a past medical history of Arthritis, Atrial fibrillation, persistent ([place]), Bladder tumor, CAD (coronary artery disease) (cardiologist-  dr Warren Lacy), Cataract, Diabetes ([place]), Full dentures, GERD (gastroesophageal reflux disease), History of colon polyps, History of MI (myocardial infarction), HLD (hyperlipidemia), Hypertension, Hypothyroidism, Lung cancer metastatic to bone ([place]) (05/02/2021), and SUI (stress urinary incontinence, female).   Surgical History:   Past Surgical History:  Procedure Laterality Date  . CARDIOVASCULAR STRESS TEST  05-01-2009   dr hochrein   normal nuclear study/  no ischemia or scar/  normal LV function and wall motion, ef 71%  . COLONOSCOPY     multiple  . CORONARY ANGIOPLASTY  Feb 1999   PTCA to RCA  . D & C HYSTEROSCOPY/  POLYPECTOMY  05-08-2009  . HYSTEROSCOPY WITH D & C  12/07/2012    Procedure: DILATATION AND CURETTAGE /HYSTEROSCOPY;  Surgeon: Elveria Royals, MD;  Location: [facility];  Service: Gynecology;  Laterality: N/A;  Dilitation and curettage /hysteroscopy/biopsey of lower segment of uterus  . ORIF ANKLE FRACTURE Left 11/10/2020   Procedure: OPEN REDUCTION INTERNAL FIXATION (ORIF) ANKLE FRACTURE;  Surgeon: Shona Needles, MD;  Location: [facility];  Service: Orthopedics;  Laterality: Left;  . TOTAL HIP ARTHROPLASTY Left 12-11-2010  . TRANSTHORACIC ECHOCARDIOGRAM  09-11-2015   mild LVH, ef 55%,  mildly reduced RVSF,  trivial TR  . TRANSURETHRAL RESECTION OF BLADDER TUMOR N/A 12/01/2015   Procedure: TRANSURETHRAL RESECTION OF BLADDER TUMOR (TURBT);  Surgeon: Kathie Rhodes, MD;  Location: [facility];  Service: Urology;  Laterality: N/A;  . TUBAL LIGATION  30     Social History:   reports that she quit smoking about 39 years ago. Her smoking use included cigarettes. She has a 22.50 pack-year smoking history. She has never used smokeless tobacco. She reports that she does not drink alcohol and does not use drugs.   Family History:  Her family history includes Atrial fibrillation in her brother; Cancer in her brother, brother, brother, and sister; Colon cancer (age of onset: 30) in her mother; Diabetes in her mother; Heart disease in her father and mother. There is no history of Breast cancer.   Allergies Allergies  Allergen Reactions  . Penicillins Itching    Has patient had a PCN reaction causing immediate rash, facial/tongue/throat swelling, SOB or lightheadedness with hypotension: Yes Has patient had a PCN reaction causing severe rash involving mucus membranes or skin necrosis: No Has patient had a PCN reaction that required hospitalization: No Has patient had a PCN reaction occurring within the last 10 years: Yes If all of the above answers are "NO", then may proceed with Cephalosporin use.   Marland Kitchen Percocet [Oxycodone-Acetaminophen] Itching     Home  Medications  Prior to Admission medications   Medication Sig Start Date End Date Taking? Authorizing Provider  acetaminophen (TYLENOL) 500 MG tablet Take 500 mg by mouth every 6 (six) hours as needed for headache (pain).   Yes [provider]  diltiazem (CARDIZEM CD) 240 MG 24 hr capsule TAKE 1 CAPSULE BY MOUTH EVERY DAY Patient taking differently: Take 240 mg by mouth at bedtime. 01/30/21  Yes Hochrein, Jeneen Rinks, MD  ELIQUIS 5 MG TABS tablet TAKE 1 TABLET(5 MG) BY MOUTH TWICE DAILY Patient taking differently: Take 5 mg by mouth 2 (two) times daily. 04/30/21  Yes Minus Breeding, MD  HYDROcodone-acetaminophen (NORCO/VICODIN) 5-325 MG tablet Take 1 tablet by mouth every 4 (four) hours as needed for moderate pain. 04/24/21  Yes [provider]  HYDROmorphone (DILAUDID) 4 MG tablet Take 1 tablet (4 mg total) by mouth every 6 (six) hours as needed for severe pain. 03/15/21  Yes [name] Ferguson, MD  levothyroxine (SYNTHROID) 100 MCG tablet TAKE 1 TABLET(100 MCG) BY MOUTH DAILY Patient taking differently: Take 100 mcg by mouth daily before breakfast. 01/30/21  Yes Benay Pike, MD  metFORMIN (GLUCOPHAGE-XR) 500 MG 24 hr tablet TAKE 1 TABLET(500 MG) BY MOUTH TWICE DAILY Patient taking differently: Take 500 mg by mouth in the morning and at bedtime. 11/18/20  Yes Benay Pike, MD  Polyethyl Glycol-Propyl Glycol (SYSTANE) 0.4-0.3 % SOLN Place 1 drop into both eyes 3 (three) times daily as needed (for dryness).   Yes [provider]  rosuvastatin (CRESTOR) 40 MG tablet TAKE 1 TABLET(40 MG) BY MOUTH DAILY Patient taking differently: Take 40 mg by mouth at bedtime. 12/29/20  Yes Minus Breeding, MD  senna (SENOKOT) 8.6 MG TABS tablet Take 1 tablet (8.6 mg total) by mouth daily as needed for mild constipation. 11/13/20  Yes Benay Pike, MD  lisinopril (ZESTRIL) 2.5 MG tablet TAKE 1 TABLET(2.5 MG) BY MOUTH DAILY Patient not taking: No sig reported 01/30/21   Benay Pike, MD   methocarbamol (ROBAXIN) 500 MG tablet Take 1 tablet (500 mg total) by mouth 3 (three) times daily. Patient not taking: No sig reported 02/23/21   Jaynee Eagles, PA-C  methylPREDNISolone (MEDROL DOSEPAK) 4 MG TBPK tablet Day 1: 24 mg on day 1 administered as 8 mg (2 tablets) before breakfast, 4 mg (1 tablet) after lunch, 4 mg (1 tablet) after  supper, and 8 mg (2 tablets) at bedtime or 24 mg (6 tablets) as a single dose or divided into 2 or 3 doses upon initiation (regardless of time of day). Day 2: 20 mg on day 2 administered as 4 mg (1 tablet) before breakfast, 4 mg (1 tablet) after lunch, 4 mg (1 tablet) after supper, and 8 mg (2 tablets) at bedtime. Day 3: 16 mg on day 3 administered as 4 mg (1 tablet) before breakfast, 4 mg (1 tablet) after lunch, 4 mg (1 tablet) after supper, and 4 mg (1 tablet) at bedtime. Day 4: 12 mg on day 4 administered as 4 mg (1 tablet) before breakfast, 4 mg (1 tablet) after lunch, and 4 mg (1 tablet) at bedtime. Day 5: 8 mg on day 5 administered as 4 mg (1 tablet) before breakfast and 4 mg (1 tablet) at bedtime. Day 6: 4 mg on day 6 administered as 4 mg (1 tablet) before breakfast. Patient not taking: No sig reported 03/18/21   Isla Pence, MD  [facility] DELICA LANCETS FINE MISC TEST TWICE DAILY( 8AM AND 10PM) Patient taking differently: 2 (two) times daily. 10/03/17   Glenis Smoker, MD  oxyCODONE (OXY IR/ROXICODONE) 5 MG immediate release tablet Take 0.5-1 tablets (2.5-5 mg total) by mouth every 6 (six) hours as needed for severe pain. Patient not taking: No sig reported 04/01/21   Benay Pike, MD  diltiazem (DILACOR XR) 120 MG 24 hr capsule Take 1 capsule (120 mg total) by mouth daily. 09/15/11 05/15/12  Minus Breeding, MD     Signature:  Chesley Mires, MD [place] Pager - [phone] 05/04/2021, 2:31 PM

## 2021-05-04 NOTE — Consult Note (Signed)
[facility] working to coordinate care for consult for hip wound. Bedside nurse report major pain issues this am with metastatic cancer dx. Will await follow up from bedside staff on needed care for wound.  Suggested air mattress for comfort, pressure redistribution, and moisture management   [place], [place], CWON-AP [phone]

## 2021-05-04 NOTE — Progress Notes (Signed)
RN informed WOC RN and Dr. Posey Pronto that pt has a 6cm x 4cm unstageable/eschar left hip wound. She reports she tends to lie on her left side frequently at home. PT at the bedside for eval and RN reported to MD that pt reports she has been debilitated and only up to use the bedside commode at home due to increased weakness since February 2022.

## 2021-05-04 NOTE — Progress Notes (Signed)
Pt sat in the recliner chair for 1.5hrs and tolerated fair. She reports not being able to get comfortable due to increased back pain. MD aware. This RN placed pt back to bed and pt went to sleep. Pt now appears to be resting comfortably.

## 2021-05-04 NOTE — Progress Notes (Signed)
Initial Nutrition Assessment  DOCUMENTATION CODES:   Obesity unspecified  INTERVENTION:  - continue Ensure Enlive BID, each supplement provides 350 kcal and 20 grams of protein. - will order Magic Cup with dinner meals, each supplement provides 290 kcal and 9 grams of protein. - will order 30 ml Prosource Plus BID, each supplement provides 100 kcal and 15 grams protein.  - will order Juven BID, each packet provides 95 calories, 2.5 grams of protein (collagen), and 9.8 grams of carbohydrate (3 grams sugar); also contains 7 grams of L-arginine and L-glutamine, 300 mg vitamin C, 15 mg vitamin E, 1.2 mcg vitamin B-12, 9.5 mg zinc, 200 mg calcium, and 1.5 g  Calcium Beta-hydroxy-Beta-methylbutyrate to support wound healing. - will order 500 mg ascorbic acid/day and 220 mg zinc sulfate/day to aid in wound healing.    NUTRITION DIAGNOSIS:   Increased nutrient needs related to acute illness,catabolic illness (GEXBM-84 infections; newly dx lung mass with spinal mets) as evidenced by estimated needs.  GOAL:   Patient will meet greater than or equal to 90% of their needs  MONITOR:   PO intake,Supplement acceptance,Labs,Weight trends  REASON FOR ASSESSMENT:   Malnutrition Screening Tool  ASSESSMENT:   70 y.o. female with medical history of persistent afib on Eliquis, hx of bladder cancer, HTN, CAD, OSA, hypothyroidism, stage 3 CKD, type 2 DM, and she tested positive for COVID-19 on 04/22/21. She presented to the ED d/t worsening back pain. She was disoriented and unable to provide information in the ED. In early April, she underwent L4 kyphoplasty.  No intakes documented since admission. She has not been seen by a [place] RD at any time in the past. She is now noted to be a/o x3; previously disoriented and unable to provide information.  Ensure Enlive was ordered BID at the time of admission per [facility] protocol and she has accepted this supplement 2 of the 3 times offered. Will order other  ONS to aid in wound healing.  Weight on 5/7 was 186 lb and weight has been stable over the past 3 months. Weight on 08/22/20 was 188 lb and it appears that weight recording was copied forward on 9/3, 9/17, 9/22, and 11/14.   Non-pitting edema to RLE documented in the edema section of flow sheet.   Per notes: - post-obstructive PNA - RUL lung mass with metastatic mediastinal, R hilar adenopathy, and spinal mets - COVID-19 infection s/p monupiravir 4/28-5/3 - newly detected T8 compression fx--not currently a candidate for intervention  - AKI on stage 3 CKD - unstageable pressure injury to L buttocks   Labs reviewed; creatinine: 1.16 mg/dl, GFR: 50 ml/min. Medications reviewed; HgbA1c: 6.2%, 100 mcg oral synthroid/day, 1 tablet senokot BID.     NUTRITION - FOCUSED PHYSICAL EXAM:  unable to complete at this time.  Diet Order:   Diet Order            Diet Heart Room service appropriate? Yes; Fluid consistency: Thin  Diet effective now                 EDUCATION NEEDS:   No education needs have been identified at this time  Skin:  Skin Assessment: Skin Integrity Issues: Skin Integrity Issues:: Unstageable Unstageable: full thickness L buttocks  Last BM:  PTA/unknown  Height:   Ht Readings from Last 1 Encounters:  05/02/21 5\' 3"  (1.6 m)    Weight:   Wt Readings from Last 1 Encounters:  05/02/21 84.4 kg    Estimated Nutritional Needs:  Kcal:  2000-2250 kcal Protein:  105-120 grams Fluid:  >/= 2.2 L/day      Jarome Matin, MS, RD, LDN, CNSC Inpatient Clinical Dietitian RD pager # available in [facility]  After hours/weekend pager # available in [facility]

## 2021-05-04 NOTE — Consult Note (Signed)
NAME:  [name], MRN:  [id number], DOB:  May 31, 1951, LOS: 1 ADMISSION DATE:  05/02/2021, CONSULTATION DATE: 05/04/2021 REFERRING MD:  Dr. Posey Pronto, [facility], CHIEF COMPLAINT:  Abnormal CT chest   History of Present Illness:  70 yr old female former smoker with persistent complaints of back pain was in ER on 03/15/21.  She had CT chest then that showed 2.8 x 2.4 cm rounded lesion in Rt upper lobe.  Also noted to have changes of centrilobular emphysema.  She was discharged from ER on levaquin and pain medication.  Plan was to have repeat CT imaging in 4 weeks as outpt.  She lives with her grandson who tested positive for COVID 19 infection.  She was not having any respiratory symptoms, but her daughter urged her to get tested.  She tested positive for COVID 19 on 04/22/21.  She had outpatient treatment with monupiravir.  She presented to hospital again on 05/02/21 with worsening back pain.  She was found to have thoracic spine compression fracture.  CT chest imaging from 05/02/21 showed increased mediastinal adenopathy and Rt upper lobe lesion now measuring 5.1 x 3.2 cm.  She was started on 4 liters supplemental oxygen and antibiotics.  She reports having recent weight loss, but she can't quantify how much.  Her activity level has been limited by back pain.  Not having cough, wheeze, chest congestion, fever, or hemoptysis.  Pertinent  Medical History  Hypothyroidism, Hypertension, Hyperlipidemia, Colon polyps, CAD, GERD, Diabetes, Bladder tumor, Persistent atrial fibrillation, Arthritis, Obstructive sleep apnea (AHI 62.2 from PSG on 04/04/18)  Significant Hospital Events:  . 5/07 Admit, start antibiotics  Tests:   CT angio chest 5/07 >> Rt hilar node with vascular encasement, lateral Rt hilar nodal mass 3.3 cm, Lt hilar node 1.1 cm, Precarinal node 1.3 cm, Rt upper lobe mass 5.1 x 3.2 cm, 3 mm RUL nodule, four small nodules in RML, centrilobular emphysema, T8 metastatic lesion   CT abd/pelvis 5/07 >>  diffuse pancreatic atrophy, posterior Lt iliac lytic bone lesion  MRI spine 5/07 >> compression fracture of T8 suggestive of pathologic fracture, moderate spinal canal stenosis, metastatic lesions in L2/L4/S1  Interim History / Subjective:    Objective   .BP 107/73 (BP Location: Left Arm)   Pulse (!) 103   Temp 97.7 F (36.5 C) (Oral)   Resp 20   Ht 5\' 3"  (1.6 m)   Wt 84.4 kg   LMP  (LMP Unknown)   SpO2 96%   BMI 32.95 kg/m    Intake/Output Summary (Last 24 hours) at 05/04/2021 1401 Last data filed at 05/04/2021 0600 Gross per 24 hour  Intake 60 ml  Output 1150 ml  Net -1090 ml   Filed Weights   05/02/21 1347  Weight: 84.4 kg    Examination:  General - alert Eyes - pupils reactive ENT - no sinus tenderness, no stridor Cardiac - regular rate/rhythm, no murmur Chest - equal breath sounds b/l, no wheezing or rales Abdomen - soft, non tender, + bowel sounds Extremities - no cyanosis, clubbing, or edema Skin - no rashes Neuro - normal strength, moves extremities, follows commands Psych - normal mood and behavior Lymphatics - no lymphadenopathy  Assessment & Plan:   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 - discussed CT imaging with her - reviewed options for obtaining tissue sampling - she is agreeable to proceed with bronchoscopy; likely will need EBUS and possibly ENB - have discussed with Dr. Lamonte Sakai and he will arrange for  procedure - risks of procedure detailed as bleeding, infection, pneumothorax, non diagnosis and complications from anesthesia  COVID 19 positive. - seems to be asymptomatic finding  Pneumonia. - antibiotics per primary team  T8 compression fracture with metastatic bone lesions. - pain control per primary team  History of obstructive sleep apnea. - uncertain whether she was using CPAP as outpt  Hx of atrial fibrillation, CAD, HTN, HLD. - not currently on anticoagulation; on eliquis as outpt - followed by Dr. Percival Spanish  with [facility]    CMP Latest Ref Rng & Units 05/03/2021 05/02/2021 05/02/2021  Glucose 70 - 99 mg/dL 85 106(H) 133(H)  BUN 8 - 23 mg/dL 15 16 20   Creatinine 0.44 - 1.00 mg/dL 1.16(H) 1.18(H) 1.37(H)  Sodium 135 - 145 mmol/L 138 134(L) 136  Potassium 3.5 - 5.1 mmol/L 3.7 3.3(L) 3.0(L)  Chloride 98 - 111 mmol/L 104 99 94(L)  CO2 22 - 32 mmol/L 23 23 26   Calcium 8.9 - 10.3 mg/dL 9.6 9.4 10.7(H)  Total Protein 6.5 - 8.1 g/dL - - 8.5(H)  Total Bilirubin 0.3 - 1.2 mg/dL - - 0.6  Alkaline Phos 38 - 126 U/L - - 115  AST 15 - 41 U/L - - 125(H)  ALT 0 - 44 U/L - - 99(H)    CBC Latest Ref Rng & Units 05/03/2021 05/02/2021 03/18/2021  WBC 4.0 - 10.5 K/uL 17.3(H) 18.7(H) 11.2(H)  Hemoglobin 12.0 - 15.0 g/dL 10.9(L) 12.1 11.2(L)  Hematocrit 36.0 - 46.0 % 35.6(L) 37.7 35.3(L)  Platelets 150 - 400 K/uL 261 331 329    CBG (last 3)  No results for input(s): GLUCAP in the last 72 hours.   Lab Results  Component Value Date   INR 1.4 (H) 11/09/2020   INR 2.0 02/28/2017   INR 2.8 01/07/2017    Review of Systems:   Reviewed and negative  Past Medical History:  She,  has a past medical history of Arthritis, Atrial fibrillation, persistent ([place]), Bladder tumor, CAD (coronary artery disease) (cardiologist-  dr Warren Lacy), Cataract, Diabetes ([place]), Full dentures, GERD (gastroesophageal reflux disease), History of colon polyps, History of MI (myocardial infarction), HLD (hyperlipidemia), Hypertension, Hypothyroidism, Lung cancer metastatic to bone ([place]) (05/02/2021), and SUI (stress urinary incontinence, female).   Surgical History:   Past Surgical History:  Procedure Laterality Date  . CARDIOVASCULAR STRESS TEST  05-01-2009   dr hochrein   normal nuclear study/  no ischemia or scar/  normal LV function and wall motion, ef 71%  . COLONOSCOPY     multiple  . CORONARY ANGIOPLASTY  Feb 1999   PTCA to RCA  . D & C HYSTEROSCOPY/  POLYPECTOMY  05-08-2009  . HYSTEROSCOPY WITH D & C  12/07/2012    Procedure: DILATATION AND CURETTAGE /HYSTEROSCOPY;  Surgeon: Elveria Royals, MD;  Location: [facility];  Service: Gynecology;  Laterality: N/A;  Dilitation and curettage /hysteroscopy/biopsey of lower segment of uterus  . ORIF ANKLE FRACTURE Left 11/10/2020   Procedure: OPEN REDUCTION INTERNAL FIXATION (ORIF) ANKLE FRACTURE;  Surgeon: Shona Needles, MD;  Location: [facility];  Service: Orthopedics;  Laterality: Left;  . TOTAL HIP ARTHROPLASTY Left 12-11-2010  . TRANSTHORACIC ECHOCARDIOGRAM  09-11-2015   mild LVH, ef 55%,  mildly reduced RVSF,  trivial TR  . TRANSURETHRAL RESECTION OF BLADDER TUMOR N/A 12/01/2015   Procedure: TRANSURETHRAL RESECTION OF BLADDER TUMOR (TURBT);  Surgeon: Kathie Rhodes, MD;  Location: [facility];  Service: Urology;  Laterality: N/A;  . TUBAL LIGATION  62     Social History:   reports that she quit smoking about 39 years ago. Her smoking use included cigarettes. She has a 22.50 pack-year smoking history. She has never used smokeless tobacco. She reports that she does not drink alcohol and does not use drugs.   Family History:  Her family history includes Atrial fibrillation in her brother; Cancer in her brother, brother, brother, and sister; Colon cancer (age of onset: 68) in her mother; Diabetes in her mother; Heart disease in her father and mother. There is no history of Breast cancer.   Allergies Allergies  Allergen Reactions  . Penicillins Itching    Has patient had a PCN reaction causing immediate rash, facial/tongue/throat swelling, SOB or lightheadedness with hypotension: Yes Has patient had a PCN reaction causing severe rash involving mucus membranes or skin necrosis: No Has patient had a PCN reaction that required hospitalization: No Has patient had a PCN reaction occurring within the last 10 years: Yes If all of the above answers are "NO", then may proceed with Cephalosporin use.   Marland Kitchen Percocet [Oxycodone-Acetaminophen] Itching     Home  Medications  Prior to Admission medications   Medication Sig Start Date End Date Taking? Authorizing Provider  acetaminophen (TYLENOL) 500 MG tablet Take 500 mg by mouth every 6 (six) hours as needed for headache (pain).   Yes [provider]  diltiazem (CARDIZEM CD) 240 MG 24 hr capsule TAKE 1 CAPSULE BY MOUTH EVERY DAY Patient taking differently: Take 240 mg by mouth at bedtime. 01/30/21  Yes Hochrein, Jeneen Rinks, MD  ELIQUIS 5 MG TABS tablet TAKE 1 TABLET(5 MG) BY MOUTH TWICE DAILY Patient taking differently: Take 5 mg by mouth 2 (two) times daily. 04/30/21  Yes Minus Breeding, MD  HYDROcodone-acetaminophen (NORCO/VICODIN) 5-325 MG tablet Take 1 tablet by mouth every 4 (four) hours as needed for moderate pain. 04/24/21  Yes [provider]  HYDROmorphone (DILAUDID) 4 MG tablet Take 1 tablet (4 mg total) by mouth every 6 (six) hours as needed for severe pain. 03/15/21  Yes [name] Ferguson, MD  levothyroxine (SYNTHROID) 100 MCG tablet TAKE 1 TABLET(100 MCG) BY MOUTH DAILY Patient taking differently: Take 100 mcg by mouth daily before breakfast. 01/30/21  Yes Benay Pike, MD  metFORMIN (GLUCOPHAGE-XR) 500 MG 24 hr tablet TAKE 1 TABLET(500 MG) BY MOUTH TWICE DAILY Patient taking differently: Take 500 mg by mouth in the morning and at bedtime. 11/18/20  Yes Benay Pike, MD  Polyethyl Glycol-Propyl Glycol (SYSTANE) 0.4-0.3 % SOLN Place 1 drop into both eyes 3 (three) times daily as needed (for dryness).   Yes [provider]  rosuvastatin (CRESTOR) 40 MG tablet TAKE 1 TABLET(40 MG) BY MOUTH DAILY Patient taking differently: Take 40 mg by mouth at bedtime. 12/29/20  Yes Minus Breeding, MD  senna (SENOKOT) 8.6 MG TABS tablet Take 1 tablet (8.6 mg total) by mouth daily as needed for mild constipation. 11/13/20  Yes Benay Pike, MD  lisinopril (ZESTRIL) 2.5 MG tablet TAKE 1 TABLET(2.5 MG) BY MOUTH DAILY Patient not taking: No sig reported 01/30/21   Benay Pike, MD   methocarbamol (ROBAXIN) 500 MG tablet Take 1 tablet (500 mg total) by mouth 3 (three) times daily. Patient not taking: No sig reported 02/23/21   Jaynee Eagles, PA-C  methylPREDNISolone (MEDROL DOSEPAK) 4 MG TBPK tablet Day 1: 24 mg on day 1 administered as 8 mg (2 tablets) before breakfast, 4 mg (1 tablet) after lunch, 4 mg (1 tablet) after  supper, and 8 mg (2 tablets) at bedtime or 24 mg (6 tablets) as a single dose or divided into 2 or 3 doses upon initiation (regardless of time of day). Day 2: 20 mg on day 2 administered as 4 mg (1 tablet) before breakfast, 4 mg (1 tablet) after lunch, 4 mg (1 tablet) after supper, and 8 mg (2 tablets) at bedtime. Day 3: 16 mg on day 3 administered as 4 mg (1 tablet) before breakfast, 4 mg (1 tablet) after lunch, 4 mg (1 tablet) after supper, and 4 mg (1 tablet) at bedtime. Day 4: 12 mg on day 4 administered as 4 mg (1 tablet) before breakfast, 4 mg (1 tablet) after lunch, and 4 mg (1 tablet) at bedtime. Day 5: 8 mg on day 5 administered as 4 mg (1 tablet) before breakfast and 4 mg (1 tablet) at bedtime. Day 6: 4 mg on day 6 administered as 4 mg (1 tablet) before breakfast. Patient not taking: No sig reported 03/18/21   Isla Pence, MD  [facility] DELICA LANCETS FINE MISC TEST TWICE DAILY( 8AM AND 10PM) Patient taking differently: 2 (two) times daily. 10/03/17   Glenis Smoker, MD  oxyCODONE (OXY IR/ROXICODONE) 5 MG immediate release tablet Take 0.5-1 tablets (2.5-5 mg total) by mouth every 6 (six) hours as needed for severe pain. Patient not taking: No sig reported 04/01/21   Benay Pike, MD  diltiazem (DILACOR XR) 120 MG 24 hr capsule Take 1 capsule (120 mg total) by mouth daily. 09/15/11 05/15/12  Minus Breeding, MD     Signature:  Chesley Mires, MD [facility] Pager - [phone] 05/04/2021, 2:31 PM

## 2021-05-04 NOTE — Progress Notes (Signed)
Patient admitted with severe mid back pain.  Workup demonstrates evidence of a new pathologic fracture of T8.  Workup demonstrates evidence of   Widely metastatic disease involving multiple vertebral levels with pathologic fracture of T8 and moderate spinal stenosis but no severe cord compression.  Patient with intractable pain and some complaints of weakness into her left lower extremity.  Patient  With good voluntary movement of her lower extremities at present.  Given her widely metastatic disease I do not think that any type of emergent surgical decompression and/or fusion is indicated.  I think that she would be best served with treatment with IV steroids and emergent palliative radiation therapy to her lesion for pain control and tumor control.  If her back pain persists then I think she should be treated with kyphoplasty per Radiology.

## 2021-05-05 DIAGNOSIS — C3490 Malignant neoplasm of unspecified part of unspecified bronchus or lung: Secondary | ICD-10-CM | POA: Diagnosis not present

## 2021-05-05 DIAGNOSIS — C7951 Secondary malignant neoplasm of bone: Secondary | ICD-10-CM | POA: Diagnosis not present

## 2021-05-05 LAB — GLUCOSE, CAPILLARY: Glucose-Capillary: 197 mg/dL — ABNORMAL HIGH (ref 70–99)

## 2021-05-05 MED ORDER — INSULIN ASPART 100 UNIT/ML IJ SOLN
0.0000 [IU] | Freq: Every day | INTRAMUSCULAR | Status: DC
  Administered 2021-05-07 – 2021-05-09 (×2): 2 [IU] via SUBCUTANEOUS
  Administered 2021-05-11: 3 [IU] via SUBCUTANEOUS
  Administered 2021-05-12: 2 [IU] via SUBCUTANEOUS
  Administered 2021-05-14: 3 [IU] via SUBCUTANEOUS
  Administered 2021-05-15: 2 [IU] via SUBCUTANEOUS

## 2021-05-05 MED ORDER — INSULIN ASPART 100 UNIT/ML IJ SOLN
0.0000 [IU] | Freq: Three times a day (TID) | INTRAMUSCULAR | Status: DC
  Administered 2021-05-06 (×2): 2 [IU] via SUBCUTANEOUS
  Administered 2021-05-06: 3 [IU] via SUBCUTANEOUS
  Administered 2021-05-07 (×2): 1 [IU] via SUBCUTANEOUS
  Administered 2021-05-08: 5 [IU] via SUBCUTANEOUS
  Administered 2021-05-08: 2 [IU] via SUBCUTANEOUS
  Administered 2021-05-08 – 2021-05-09 (×2): 3 [IU] via SUBCUTANEOUS
  Administered 2021-05-09: 7 [IU] via SUBCUTANEOUS
  Administered 2021-05-09: 3 [IU] via SUBCUTANEOUS
  Administered 2021-05-10: 2 [IU] via SUBCUTANEOUS
  Administered 2021-05-10: 3 [IU] via SUBCUTANEOUS
  Administered 2021-05-10: 2 [IU] via SUBCUTANEOUS
  Administered 2021-05-11: 5 [IU] via SUBCUTANEOUS
  Administered 2021-05-11 (×2): 3 [IU] via SUBCUTANEOUS
  Administered 2021-05-12: 2 [IU] via SUBCUTANEOUS
  Administered 2021-05-12: 3 [IU] via SUBCUTANEOUS
  Administered 2021-05-12: 2 [IU] via SUBCUTANEOUS
  Administered 2021-05-13: 3 [IU] via SUBCUTANEOUS
  Administered 2021-05-13: 2 [IU] via SUBCUTANEOUS
  Administered 2021-05-13: 7 [IU] via SUBCUTANEOUS
  Administered 2021-05-14: 3 [IU] via SUBCUTANEOUS
  Administered 2021-05-14: 2 [IU] via SUBCUTANEOUS
  Administered 2021-05-14 – 2021-05-15 (×2): 3 [IU] via SUBCUTANEOUS
  Administered 2021-05-15: 2 [IU] via SUBCUTANEOUS
  Administered 2021-05-15: 3 [IU] via SUBCUTANEOUS
  Administered 2021-05-16: 2 [IU] via SUBCUTANEOUS
  Administered 2021-05-16: 3 [IU] via SUBCUTANEOUS
  Administered 2021-05-16: 2 [IU] via SUBCUTANEOUS
  Administered 2021-05-17: 1 [IU] via SUBCUTANEOUS

## 2021-05-05 MED ORDER — DILTIAZEM HCL ER COATED BEADS 180 MG PO CP24
300.0000 mg | ORAL_CAPSULE | Freq: Every day | ORAL | Status: DC
  Administered 2021-05-05 – 2021-05-12 (×8): 300 mg via ORAL
  Filled 2021-05-05 (×8): qty 1

## 2021-05-05 MED ORDER — ENOXAPARIN SODIUM 80 MG/0.8ML IJ SOSY
80.0000 mg | PREFILLED_SYRINGE | Freq: Once | INTRAMUSCULAR | Status: AC
  Administered 2021-05-05: 80 mg via SUBCUTANEOUS
  Filled 2021-05-05: qty 0.8

## 2021-05-05 NOTE — Consult Note (Signed)
[place] Nurse wound consult note Consultation was completed by review of records, images and assistance from the bedside nurse/clinical staff.   Reason for Consult: hip wound Wound type: Unstageable Pressure Injury Pressure Injury POA: Yes Measurement: see nursing flow sheet Wound bed:100% black; hard eschar, discussed with bedside nurse Drainage (amount, consistency, odor) none Periwound:intact Dressing procedure/placement/frequency:  Add hydrocolloid for autolytic debridement; x 3 days and WOC to reevaluate for further debridement options.   Add low air loss mattress for comfort, moisture management and pressure redistribution.    Thanks  Carrick Rijos [employer], RN,CWOCN, CNS, [place] [phone])

## 2021-05-05 NOTE — TOC Progression Note (Signed)
Transition of Care [facility]) - Progression Note    Patient Details  Name: [name] MRN: [id number] Date of Birth: Oct 24, 1951  Transition of [facility]) CM/SW Contact  Purcell Mouton, RN Phone Number: 05/05/2021, 2:33 PM  Clinical Narrative:    Spoke with pt concerning discharge to home or SNF.  Pt was not sure of what she wanted to do. TOC will continue to follow for discharge needs.     Expected Discharge Plan: [place] (vs SNF) Barriers to Discharge: No Barriers Identified  Expected Discharge Plan and Services Expected Discharge Plan: [place] (vs SNF)       Living arrangements for the past 2 months: Single Family Home                                       Social Determinants of Health (SDOH) Interventions    Readmission Risk Interventions Readmission Risk Prevention Plan 11/10/2020  Transportation Screening Complete  PCP or Specialist Appt within 5-7 Days Complete  Home Care Screening Complete  Medication Review (RN CM) Complete  Some recent data might be hidden

## 2021-05-05 NOTE — Progress Notes (Signed)
Pt sat in the chair for 2 hours and tolerated fairly. She reported she was exhausted and it took 2 assist to get her back to bed. Pt placed in bed and reported no needs. She fell asleep before RN left the room.

## 2021-05-05 NOTE — Progress Notes (Signed)
[facility] Progress Note  Patient: [name]    DTO:[id number]  DOA: 05/02/2021     Date of Service: the patient was seen and examined on 05/05/2021  Brief hospital course: Past medical history of chronic A. fib on Eliquis, bladder cancer, HTN, CAD, OSA, hypothyroidism, CKD 3A, type II DM, COVID infection.  Presents with complaints over biopsing back pain. Appears to have postobstructive pneumonia as well as compression fracture on T8.  Neurosurgery recommends steroids and pain control. Also has a lung mass that requires biopsy.  Bronchoscopy currently scheduled on 5/12. Currently plan is pain control and treatment for postobstructive pneumonia.  Assessment and Plan: 1.  Postobstructive pneumonia Acute hypoxic respiratory failure, POA 74% on room air on admission Identified to have a lung mass. Size was 4.4 x 2.3 cm on 3/20 and currently 5.1 x 3.2 cm on 5/7 CT scan. Leukocytosis as well as cough also present. Hypoxic, currently.  Does not use any oxygen at her baseline. Blood cultures currently negative. Lung mass appears to have increased significantly in size and therefore suspected to have postobstructive pneumonia.  Continue current antibiotic.  Incentive spirometry and flutter device also recommended.  2.  Lung mass, right upper lobe with metastasis CT scan shows right upper lobe lung mass with metastatic mediastinal and right hilar adenopathy as well as bony metastasis. Recently treated for L4 compression fracture and appears to have T8 compression fracture. CT scan negative for PE. The mass appears to have increased in size over few weeks. Patient requires bronchoscopy and biopsy. Appreciate pulmonary consultation.  Bronchoscopy scheduled on 05/07/2021.  Holding anticoagulation for bronch.  3.  COVID-19 infection Reportedly first positive test on 04/22/2021. Treated with monupiravir, 4/28-5/3. Currently while the patient does have worsening hypoxia and bilateral  crackles CT scan negative for any worsening infiltrate bilaterally and therefore no indication of further therapy for now. CT value 34.2 Requested husband to provide positive test information to determine isolation. For now remain in isolation.  4.  T8 compression fracture Seen on CT scan. Patient presented with worsening pain. Likely pathological fracture. Appreciate neurosurgery consultation. Recommend steroids as well as radiation therapy to her spine. Started on Decadron.  Currently pain significantly better.  Also weakness of the left leg improving likely secondary to control of the pain. Discussed with radiation oncology, Dr. Sondra Come.  Given that the patient is still in isolation patient will set up for simulation and further therapy based on the biopsy results outpatient.  5.  Persistent A. fib with RVR Continue Cardizem, still has some RVR. Will increase the dose from 240 mg to 300 mg. Continue to hold Eliquis in case pulmonary performed biopsy Received 1 dose of therapeutic Lovenox on 5/10.  6.  Type 2 diabetes mellitus, uncontrolled with hyperglycemia Hemoglobin A1c 6.2. Continue to monitor with sliding scale insulin.  7.  Hypothyroidism  continue Synthroid.  8.  Acute kidney injury on CKD 3A Baseline serum creatinine 1.05. On presentation serum creatinine 1.37. Currently improving.  Monitor.  9.  Obesity Placing the patient at high risk of poor outcome in the setting of COVID-19 illness. Body mass index is 32.95 kg/m.   10.  Hypokalemia Replaced.  Monitor.  11.  Elevated LFT Monitor.  We will recheck tomorrow.  12.  Left buttock unstageable ulcer, POA Continue foam dressing.  Wound care consulted.  Body mass index is 32.95 kg/m.  Nutrition Problem: Increased nutrient needs Etiology: acute illness,catabolic illness (XIPJA-25 infections; newly dx lung mass with spinal mets) Interventions:  Interventions: Ensure Enlive (each supplement provides  350kcal and 20 grams of protein),Prostat,Magic cup  Pressure Injury 05/02/21 Hip Left Unstageable - Full thickness tissue loss in which the base of the injury is covered by slough (yellow, tan, gray, green or brown) and/or eschar (tan, brown or black) in the wound bed. black wound bed (Active)  05/02/21 2130  Location: Hip  Location Orientation: Left  Staging: Unstageable - Full thickness tissue loss in which the base of the injury is covered by slough (yellow, tan, gray, green or brown) and/or eschar (tan, brown or black) in the wound bed.  Wound Description (Comments): black wound bed  Present on Admission: Yes    Diet: Carb modified diet DVT Prophylaxis:   Place and maintain sequential compression device Start: 05/03/21 1142  Advance goals of care discussion: Full code  Family Communication: family was present at bedside, at the time of interview.  The pt provided permission to discuss medical plan with the family. Opportunity was given to ask question and all questions were answered satisfactorily.   Disposition:  Status is: Inpatient  Remains inpatient appropriate because: Ongoing pain control need, SNF needs as well as bronchoscopy on 5/12.  Dispo: The patient is from: Home              Anticipated d/c is to: SNF              Patient currently is not medically stable to d/c.   Difficult to place patient   Subjective: Continues to have pain although improving.  No nausea no vomiting.  Was able to sit in the chair for 2 hours today.  No nausea or vomiting.  Still constipated.  Minimal oral intake.  Physical Exam:  General: Appear in mild distress, no Rash; Oral Mucosa Clear, moist. no Abnormal Neck Mass Or lumps, Conjunctiva normal  Cardiovascular: S1 and S2 Present, no Murmur, Respiratory: good respiratory effort, Bilateral Air entry present and CTA, no Crackles, no wheezes Abdomen: Bowel Sound present, Soft and no tenderness Extremities: no Pedal edema Neurology: alert and  oriented to time, place, and person affect appropriate. no new focal deficit Gait not checked due to patient safety concerns   General: Appear in mild distress, no Rash; Oral Mucosa Clear, moist. no Abnormal Neck Mass Or lumps, Conjunctiva normal  Cardiovascular: S1 and S2 Present, no Murmur, Respiratory: good respiratory effort, Bilateral Air entry present and bilateral * Crackles, no wheezes Abdomen: Bowel Sound present, Soft and no tenderness Extremities: trace Pedal edema Neurology: alert and oriented to time, place, and person affect appropriate. no new focal deficit, left lower extremity deficit secondary to pain Gait not checked due to patient safety concerns  Vitals:   05/05/21 0603 05/05/21 1015 05/05/21 1018 05/05/21 1426  BP: 112/68 112/67 112/67 113/67  Pulse: 89  90 64  Resp: 18   19  Temp: 97.6 F (36.4 C)   97.7 F (36.5 C)  TempSrc: Oral   Oral  SpO2: 96%   95%  Weight:      Height:        Intake/Output Summary (Last 24 hours) at 05/05/2021 1850 Last data filed at 05/05/2021 0700 Gross per 24 hour  Intake 100 ml  Output 1250 ml  Net -1150 ml   Filed Weights   05/02/21 1347  Weight: 84.4 kg   Data Reviewed: I have personally reviewed and interpreted daily labs, tele strips, imaging. I reviewed all nursing notes, pharmacy notes, vitals, pertinent old records I have discussed plan  of care as described above with RN and patient/family.  CBC: Recent Labs  Lab 05/02/21 1450 05/03/21 0813  WBC 18.7* 17.3*  NEUTROABS 15.3*  --   HGB 12.1 10.9*  HCT 37.7 35.6*  MCV 84.9 88.3  PLT 331 536   Basic Metabolic Panel: Recent Labs  Lab 05/02/21 1450 05/02/21 2123 05/03/21 0400  NA 136 134* 138  K 3.0* 3.3* 3.7  CL 94* 99 104  CO2 26 23 23   GLUCOSE 133* 106* 85  BUN 20 16 15   CREATININE 1.37* 1.18* 1.16*  CALCIUM 10.7* 9.4 9.6  MG 2.0  --   --    Studies: No results found.  Scheduled Meds: . (feeding supplement) PROSource Plus  30 mL Oral BID BM   . vitamin C  500 mg Oral Daily  . azithromycin  500 mg Oral Daily  . dexamethasone (DECADRON) injection  4 mg Intravenous Q6H  . diltiazem  300 mg Oral Daily  . feeding supplement  237 mL Oral BID BM  . levothyroxine  100 mcg Oral Q0600  . nutrition supplement (JUVEN)  1 packet Oral BID BM  . rosuvastatin  40 mg Oral QHS  . senna-docusate  1 tablet Oral BID  . zinc sulfate  220 mg Oral Daily   Continuous Infusions: . cefTRIAXone (ROCEPHIN)  IV 2 g (05/05/21 1208)   PRN Meds: acetaminophen, morphine injection, oxyCODONE  Time spent: 35 minutes  Author: Berle Mull, MD [facility] 05/05/2021 6:50 PM  To reach On-call, see care teams to locate the attending and reach out via [email or web address]. Between 7PM-7AM, please contact night-coverage If you still have difficulty reaching the attending provider, please page the [facility] (Director on Call) for [facility] on amion for assistance.

## 2021-05-06 DIAGNOSIS — C3490 Malignant neoplasm of unspecified part of unspecified bronchus or lung: Secondary | ICD-10-CM | POA: Diagnosis not present

## 2021-05-06 DIAGNOSIS — C7951 Secondary malignant neoplasm of bone: Secondary | ICD-10-CM | POA: Diagnosis not present

## 2021-05-06 LAB — GLUCOSE, CAPILLARY
Glucose-Capillary: 177 mg/dL — ABNORMAL HIGH (ref 70–99)
Glucose-Capillary: 229 mg/dL — ABNORMAL HIGH (ref 70–99)
Glucose-Capillary: 168 mg/dL — ABNORMAL HIGH (ref 70–99)
Glucose-Capillary: 198 mg/dL — ABNORMAL HIGH (ref 70–99)

## 2021-05-06 LAB — PROTIME-INR
INR: 1.3 — ABNORMAL HIGH (ref 0.8–1.2)
Prothrombin Time: 15.9 seconds — ABNORMAL HIGH (ref 11.4–15.2)

## 2021-05-06 LAB — COMPREHENSIVE METABOLIC PANEL
ALT: 243 U/L — ABNORMAL HIGH (ref 0–44)
AST: 286 U/L — ABNORMAL HIGH (ref 15–41)
Albumin: 2.4 g/dL — ABNORMAL LOW (ref 3.5–5.0)
Alkaline Phosphatase: 116 U/L (ref 38–126)
Anion gap: 11 (ref 5–15)
BUN: 25 mg/dL — ABNORMAL HIGH (ref 8–23)
CO2: 26 mmol/L (ref 22–32)
Calcium: 9.2 mg/dL (ref 8.9–10.3)
Chloride: 98 mmol/L (ref 98–111)
Creatinine, Ser: 0.79 mg/dL (ref 0.44–1.00)
GFR, Estimated: >60 mL/min (ref >60)
Glucose, Bld: 168 mg/dL — ABNORMAL HIGH (ref 70–99)
Potassium: 3.6 mmol/L (ref 3.5–5.1)
Sodium: 135 mmol/L (ref 135–145)
Total Bilirubin: 0.3 mg/dL (ref 0.3–1.2)
Total Protein: 6.4 g/dL — ABNORMAL LOW (ref 6.5–8.1)

## 2021-05-06 LAB — CBC WITH DIFFERENTIAL/PLATELET
Abs Immature Granulocytes: 0.13 10*3/uL — ABNORMAL HIGH (ref 0.00–0.07)
Basophils Absolute: 0 10*3/uL (ref 0.0–0.1)
Basophils Relative: 0 %
Eosinophils Absolute: 0 10*3/uL (ref 0.0–0.5)
Eosinophils Relative: 0 %
HCT: 32.5 % — ABNORMAL LOW (ref 36.0–46.0)
Hemoglobin: 9.9 g/dL — ABNORMAL LOW (ref 12.0–15.0)
Immature Granulocytes: 1 %
Lymphocytes Relative: 7 %
Lymphs Abs: 1.2 10*3/uL (ref 0.7–4.0)
MCH: 27.1 pg (ref 26.0–34.0)
MCHC: 30.5 g/dL (ref 30.0–36.0)
MCV: 89 fL (ref 80.0–100.0)
Monocytes Absolute: 0.8 10*3/uL (ref 0.1–1.0)
Monocytes Relative: 5 %
Neutro Abs: 15.4 10*3/uL — ABNORMAL HIGH (ref 1.7–7.7)
Neutrophils Relative %: 87 %
Platelets: 287 10*3/uL (ref 150–400)
RBC: 3.65 MIL/uL — ABNORMAL LOW (ref 3.87–5.11)
RDW: 16 % — ABNORMAL HIGH (ref 11.5–15.5)
WBC: 17.6 10*3/uL — ABNORMAL HIGH (ref 4.0–10.5)
nRBC: 0 % (ref 0.0–0.2)

## 2021-05-06 LAB — MAGNESIUM: Magnesium: 1.9 mg/dL (ref 1.7–2.4)

## 2021-05-06 MED ORDER — POLYETHYLENE GLYCOL 3350 17 G PO PACK
17.0000 g | PACK | Freq: Two times a day (BID) | ORAL | Status: DC
  Administered 2021-05-06 – 2021-05-15 (×17): 17 g via ORAL
  Filled 2021-05-06 (×17): qty 1

## 2021-05-06 MED ORDER — SODIUM CHLORIDE 0.9 % IV SOLN
3.0000 g | Freq: Four times a day (QID) | INTRAVENOUS | Status: DC
  Administered 2021-05-06 – 2021-05-09 (×13): 3 g via INTRAVENOUS
  Filled 2021-05-06: qty 3
  Filled 2021-05-06: qty 8
  Filled 2021-05-06: qty 3
  Filled 2021-05-06: qty 8
  Filled 2021-05-06: qty 3
  Filled 2021-05-06: qty 8
  Filled 2021-05-06 (×2): qty 3
  Filled 2021-05-06 (×2): qty 8
  Filled 2021-05-06 (×2): qty 3
  Filled 2021-05-06 (×2): qty 8

## 2021-05-06 NOTE — Progress Notes (Signed)
[facility] Progress Note  Patient: [name]    CBJ:[id number]  DOA: 05/02/2021     Date of Service: the patient was seen and examined on 05/06/2021  Brief hospital course: Past medical history of chronic A. fib on Eliquis, bladder cancer, HTN, CAD, OSA, hypothyroidism, CKD 3A, type II DM, COVID infection.  Presents with complaints over biopsing back pain. Appears to have postobstructive pneumonia as well as compression fracture on T8.  Neurosurgery recommends steroids and pain control. Also has a lung mass that requires biopsy.  Bronchoscopy currently scheduled on 5/12. Currently plan is pain control and treatment for postobstructive pneumonia.  Assessment and Plan: 1.  Postobstructive pneumonia Acute hypoxic respiratory failure, POA 74% on room air on admission Identified to have a lung mass. Size was 4.4 x 2.3 cm on 3/20 and currently 5.1 x 3.2 cm on 5/7 CT scan. Leukocytosis as well as cough also present. Hypoxic, currently.  Does not use any oxygen at her baseline. Blood cultures currently negative. Lung mass appears to have increased significantly in size and therefore suspected to have postobstructive pneumonia.  Persistent leukocytosis, change abx to unasyn for better postobtructive pna coverage.  Incentive spirometry and flutter device also recommended.  2.  Lung mass, right upper lobe with metastasis CT scan shows right upper lobe lung mass with metastatic mediastinal and right hilar adenopathy as well as bony metastasis. Recently treated for L4 compression fracture and appears to have T8 compression fracture. CT scan negative for PE. The mass appears to have increased in size over few weeks. Patient requires bronchoscopy and biopsy. Appreciate pulmonary consultation.  Bronchoscopy scheduled on 05/07/2021.  Holding anticoagulation for bronch., resume  eliquis when ok with pulmonology  3.  COVID-19 infection Reportedly first positive test on 04/22/2021. Again  positive on 5/7, CT value 34.2 Treated with monupiravir, 4/28-5/3. Currently while the patient does have worsening hypoxia and bilateral crackles CT scan negative for any worsening infiltrate bilaterally and therefore no indication of further therapy for now. Due to immunosuppressed status from underline metastatic cancer will continue need to be in isolation   4.  T8 compression fracture Seen on CT scan. Patient presented with worsening pain. Likely pathological fracture. Appreciate neurosurgery consultation. Recommend steroids as well as radiation therapy to her spine. Started on Decadron.  Currently pain significantly better.  Also weakness of the left leg improving likely secondary to control of the pain. Discussed with radiation oncology, Dr. Sondra Come.  Given that the patient is still in isolation patient will set up for simulation and further therapy based on the biopsy results outpatient.  5.  Persistent A. fib with RVR Continue Cardizem, still has some RVR. Will increase the dose from 240 mg to 300 mg. Continue to hold Eliquis in case pulmonary performed biopsy Received 1 dose of therapeutic Lovenox on 5/10. Resume eliquis when ok with pulmonology after bronchoscopy on 5/12  6.  Type 2 diabetes mellitus, uncontrolled with hyperglycemia Hemoglobin A1c 6.2. Continue to monitor with sliding scale insulin.  7.  Hypothyroidism  continue Synthroid.  8.  Acute kidney injury on CKD 3A Baseline serum creatinine 1.05. On presentation serum creatinine 1.37. Currently improving.  Monitor.  9.  Obesity Placing the patient at high risk of poor outcome in the setting of COVID-19 illness. Body mass index is 32.95 kg/m.   10.  Hypokalemia Replaced.  Monitor.  11.  Elevated LFT Unclear etiology  hepatobiliary: Interval dilated gallbladder without visible gallstones or wall thickening. No pericholecystic fluid. Unremarkable liver.  check CK, hepatitis panel DC Tylenol, hold  statin Repeat BMP in the morning  12.  Left buttock unstageable ulcer, POA Continue foam dressing.  Wound care consulted.  FTT: PT recommended SNF placement  Body mass index is 32.95 kg/m.  Nutrition Problem: Increased nutrient needs Etiology: acute illness,catabolic illness (UEAVW-09 infections; newly dx lung mass with spinal mets) Interventions: Interventions: Ensure Enlive (each supplement provides 350kcal and 20 grams of protein),Prostat,Magic cup  Pressure Injury 05/02/21 Hip Left Unstageable - Full thickness tissue loss in which the base of the injury is covered by slough (yellow, tan, gray, green or brown) and/or eschar (tan, brown or black) in the wound bed. black wound bed (Active)  05/02/21 2130  Location: Hip  Location Orientation: Left  Staging: Unstageable - Full thickness tissue loss in which the base of the injury is covered by slough (yellow, tan, gray, green or brown) and/or eschar (tan, brown or black) in the wound bed.  Wound Description (Comments): black wound bed  Present on Admission: Yes    Diet: Carb modified diet DVT Prophylaxis:   Place and maintain sequential compression device Start: 05/03/21 1142  Advance goals of care discussion: Full code  Family Communication: she declined my offer to call her husband, she said husband was here earlier, and is updated,   Disposition:  Status is: Inpatient  Remains inpatient appropriate because: Ongoing pain control need, SNF needs as well as bronchoscopy on 5/12.  Dispo: The patient is from: Home              Anticipated d/c is to: SNF              Patient currently is not medically stable to d/c.   Difficult to place patient   Subjective:  Reports pain is better controlled, last bm was prior to admission, no ab pain.  No nausea no vomiting.  Minimal oral intake. Feeling weak, She is on room air, no fever, some dry cough noticed during encounter  Physical Exam:  General: appear very weak and  pale Cardiovascular: S1 and S2 Present, no Murmur, Respiratory: good respiratory effort, Bilateral Air entry present and CTA, no Crackles, no wheezes Abdomen: Bowel Sound present, Soft and no tenderness Extremities: no Pedal edema Neurology: alert and oriented to time, place, and person affect appropriate. no new focal deficit Gait not checked due to patient safety concerns     Vitals:   05/06/21 0514 05/06/21 0832 05/06/21 0837 05/06/21 1413  BP: 98/66 (!) 107/56 (!) 107/56 115/77  Pulse: 92  77 89  Resp: 14   18  Temp: 97.7 F (36.5 C)   97.6 F (36.4 C)  TempSrc: Oral   Oral  SpO2: 98%   98%  Weight:      Height:        Intake/Output Summary (Last 24 hours) at 05/06/2021 1857 Last data filed at 05/06/2021 1800 Gross per 24 hour  Intake 420.53 ml  Output 500 ml  Net -79.47 ml   Filed Weights   05/02/21 1347  Weight: 84.4 kg   Data Reviewed: I have personally reviewed and interpreted daily labs, tele strips, imaging. I reviewed all nursing notes, pharmacy notes, vitals, pertinent old records I have discussed plan of care as described above with RN and patient/family.  CBC: Recent Labs  Lab 05/02/21 1450 05/03/21 0813 05/06/21 0426  WBC 18.7* 17.3* 17.6*  NEUTROABS 15.3*  --  15.4*  HGB 12.1 10.9* 9.9*  HCT 37.7 35.6* 32.5*  MCV 84.9  88.3 89.0  PLT 331 261 790   Basic Metabolic Panel: Recent Labs  Lab 05/02/21 1450 05/02/21 2123 05/03/21 0400 05/06/21 0426  NA 136 134* 138 135  K 3.0* 3.3* 3.7 3.6  CL 94* 99 104 98  CO2 26 23 23 26   GLUCOSE 133* 106* 85 168*  BUN 20 16 15  25*  CREATININE 1.37* 1.18* 1.16* 0.79  CALCIUM 10.7* 9.4 9.6 9.2  MG 2.0  --   --  1.9   Studies: No results found.  Scheduled Meds: . (feeding supplement) PROSource Plus  30 mL Oral BID BM  . vitamin C  500 mg Oral Daily  . dexamethasone (DECADRON) injection  4 mg Intravenous Q6H  . diltiazem  300 mg Oral Daily  . feeding supplement  237 mL Oral BID BM  . insulin aspart   0-5 Units Subcutaneous QHS  . insulin aspart  0-9 Units Subcutaneous TID WC  . levothyroxine  100 mcg Oral Q0600  . nutrition supplement (JUVEN)  1 packet Oral BID BM  . polyethylene glycol  17 g Oral BID  . senna-docusate  1 tablet Oral BID  . zinc sulfate  220 mg Oral Daily   Continuous Infusions: . ampicillin-sulbactam (UNASYN) IV 3 g (05/06/21 1734)   PRN Meds: morphine injection, oxyCODONE  Time spent: 35 minutes  Author: Florencia Reasons, MD PhD FACP [facility] 05/06/2021 6:57 PM  To reach On-call, see care teams to locate the attending and reach out via [email or web address]. Between 7PM-7AM, please contact night-coverage If you still have difficulty reaching the attending provider, please page the [facility] (Director on Call) for [facility] on amion for assistance.

## 2021-05-06 NOTE — Progress Notes (Signed)
Physical Therapy Treatment Patient Details Name: [name] MRN: [id number] DOB: Jun 14, 1951 Today's Date: 05/06/2021    History of Present Illness 70 yr old female former smoker with persistent complaints of back pain was in ER on 03/15/21.  She had CT chest then that showed 2.8 x 2.4 cm rounded lesion in Rt upper lobe.  Also noted to have changes of centrilobular emphysema.  She was discharged from ER on levaquin and pain medication.  Positive for COVID 19 on 04/22/21.  She had outpatient treatment with monupiravir.  She presented to hospital again on 05/02/21 with worsening back pain. CT chest imaging from 05/02/21 showed increased mediastinal adenopathy and Rt upper lobe lesion now measuring 5.1 x 3.2 cm.  MRI: compression fracture of T8 suggestive of pathologic fracture, moderate spinal canal stenosis, metastatic lesions in L2/L4/S1    PT Comments    Progressing slowly with mobility. Pt took a few steps in the room with a RW. Mobility remains limited by pain, weakness, fatigue, dyspnea. Some encouragement required for participation. Remained on [place] O2-sats >90%. Will continue to follow and progress activity as able. Encouraged pt to sit in recliner for as long as she could tolerate it.     Follow Up Recommendations  SNF     Equipment Recommendations  None recommended by PT    Recommendations for Other Services       Precautions / Restrictions Precautions Precautions: Fall;Back Restrictions Weight Bearing Restrictions: No    Mobility  Bed Mobility Overal bed mobility: Needs Assistance Bed Mobility: Rolling;Sidelying to Sit Rolling: Mod assist Sidelying to sit: Mod assist;+2 for physical assistance;+2 for safety/equipment;HOB elevated       General bed mobility comments: cues for log roll technique, assist to perform roll with pt reaching for bed rail, assist for upper and lower body to sit upright. increased time.    Transfers Overall transfer level: Needs  assistance Equipment used: Rolling walker (2 wheeled) Transfers: Sit to/from Stand Sit to Stand: Mod assist;+2 physical assistance;+2 safety/equipment;From elevated surface Stand pivot transfers: Mod assist;+2 physical assistance;+2 safety/equipment       General transfer comment: Assist to power up, steady, control descent. Stand pivot, bed to recliner, with RW. Remained on [place] O2-sats >90%  Ambulation/Gait Ambulation/Gait assistance: Min assist;+2 physical assistance;+2 safety/equipment Gait Distance (Feet): 3 Feet Assistive device: Rolling walker (2 wheeled) Gait Pattern/deviations: Step-to pattern;Trunk flexed     General Gait Details: Assist to stabilize pt for a few steps across room. Distance limited by pain, weakness, dyspnea. Remained on  O2-sats >90%   Stairs             Wheelchair Mobility    Modified Rankin (Stroke Patients Only)       Balance Overall balance assessment: Needs assistance         Standing balance support: Bilateral upper extremity supported Standing balance-Leahy Scale: Poor                              Cognition Arousal/Alertness: Awake/alert Behavior During Therapy: WFL for tasks assessed/performed Overall Cognitive Status: Within Functional Limits for tasks assessed                                        Exercises      General Comments        Pertinent Vitals/Pain Pain Assessment: Faces Faces  Pain Scale: Hurts even more Pain Location: back Pain Descriptors / Indicators: Grimacing;Discomfort;Aching Pain Intervention(s): Limited activity within patient's tolerance;Monitored during session;Repositioned    Home Living                      Prior Function            PT Goals (current goals can now be found in the care plan section) Progress towards PT goals: Progressing toward goals    Frequency    Min 2X/week      PT Plan Current plan remains appropriate     Co-evaluation              AM-PAC PT "6 Clicks" Mobility   Outcome Measure  Help needed turning from your back to your side while in a flat bed without using bedrails?: Total Help needed moving from lying on your back to sitting on the side of a flat bed without using bedrails?: Total Help needed moving to and from a bed to a chair (including a wheelchair)?: Total Help needed standing up from a chair using your arms (e.g., wheelchair or bedside chair)?: Total Help needed to walk in hospital room?: Total Help needed climbing 3-5 steps with a railing? : Total 6 Click Score: 6    End of Session Equipment Utilized During Treatment: Oxygen;Gait belt Activity Tolerance: Patient limited by fatigue;Patient limited by pain Patient left: in chair;with call bell/phone within reach   PT Visit Diagnosis: Difficulty in walking, not elsewhere classified (R26.2);Muscle weakness (generalized) (M62.81)     Time: 1975-8832 PT Time Calculation (min) (ACUTE ONLY): 22 min  Charges:  $Gait Training: 8-22 mins                         Doreatha Massed, PT Acute Rehabilitation  Office: [phone] Pager: [phone]

## 2021-05-07 ENCOUNTER — Inpatient Hospital Stay (HOSPITAL_COMMUNITY): Payer: Medicare Other

## 2021-05-07 ENCOUNTER — Other Ambulatory Visit: Payer: Medicare Other

## 2021-05-07 ENCOUNTER — Inpatient Hospital Stay (HOSPITAL_COMMUNITY): Payer: Medicare Other | Admitting: Anesthesiology

## 2021-05-07 ENCOUNTER — Encounter (HOSPITAL_COMMUNITY): Payer: Self-pay | Admitting: Internal Medicine

## 2021-05-07 ENCOUNTER — Encounter (HOSPITAL_COMMUNITY)
Admission: EM | Disposition: A | Discharge status: Hospice | Payer: Self-pay | Source: Home / Self Care | Attending: Internal Medicine

## 2021-05-07 DIAGNOSIS — C7951 Secondary malignant neoplasm of bone: Secondary | ICD-10-CM | POA: Diagnosis not present

## 2021-05-07 DIAGNOSIS — C3490 Malignant neoplasm of unspecified part of unspecified bronchus or lung: Secondary | ICD-10-CM | POA: Diagnosis not present

## 2021-05-07 HISTORY — PX: ENDOBRONCHIAL ULTRASOUND: SHX5096

## 2021-05-07 HISTORY — PX: VIDEO BRONCHOSCOPY: SHX5072

## 2021-05-07 HISTORY — PX: BRONCHIAL NEEDLE ASPIRATION BIOPSY: SHX5106

## 2021-05-07 HISTORY — PX: BRONCHIAL BIOPSY: SHX5109

## 2021-05-07 HISTORY — PX: BRONCHIAL BRUSHINGS: SHX5108

## 2021-05-07 HISTORY — PX: HEMOSTASIS CONTROL: SHX6838

## 2021-05-07 LAB — CBC WITH DIFFERENTIAL/PLATELET
Abs Immature Granulocytes: 0.1 10*3/uL — ABNORMAL HIGH (ref 0.00–0.07)
Basophils Absolute: 0 10*3/uL (ref 0.0–0.1)
Basophils Relative: 0 %
Eosinophils Absolute: 0 10*3/uL (ref 0.0–0.5)
Eosinophils Relative: 0 %
HCT: 33.4 % — ABNORMAL LOW (ref 36.0–46.0)
Hemoglobin: 10.5 g/dL — ABNORMAL LOW (ref 12.0–15.0)
Immature Granulocytes: 1 %
Lymphocytes Relative: 6 %
Lymphs Abs: 0.8 10*3/uL (ref 0.7–4.0)
MCH: 27 pg (ref 26.0–34.0)
MCHC: 31.4 g/dL (ref 30.0–36.0)
MCV: 85.9 fL (ref 80.0–100.0)
Monocytes Absolute: 0.7 10*3/uL (ref 0.1–1.0)
Monocytes Relative: 5 %
Neutro Abs: 13.1 10*3/uL — ABNORMAL HIGH (ref 1.7–7.7)
Neutrophils Relative %: 88 %
Platelets: 250 10*3/uL (ref 150–400)
RBC: 3.89 MIL/uL (ref 3.87–5.11)
RDW: 15.9 % — ABNORMAL HIGH (ref 11.5–15.5)
WBC: 14.8 10*3/uL — ABNORMAL HIGH (ref 4.0–10.5)
nRBC: 0 % (ref 0.0–0.2)

## 2021-05-07 LAB — EXPECTORATED SPUTUM ASSESSMENT W GRAM STAIN, RFLX TO RESP C

## 2021-05-07 LAB — COMPREHENSIVE METABOLIC PANEL
ALT: 244 U/L — ABNORMAL HIGH (ref 0–44)
AST: 252 U/L — ABNORMAL HIGH (ref 15–41)
Albumin: 2.3 g/dL — ABNORMAL LOW (ref 3.5–5.0)
Alkaline Phosphatase: 111 U/L (ref 38–126)
Anion gap: 10 (ref 5–15)
BUN: 25 mg/dL — ABNORMAL HIGH (ref 8–23)
CO2: 27 mmol/L (ref 22–32)
Calcium: 9.4 mg/dL (ref 8.9–10.3)
Chloride: 100 mmol/L (ref 98–111)
Creatinine, Ser: 1.1 mg/dL — ABNORMAL HIGH (ref 0.44–1.00)
GFR, Estimated: 53 mL/min — ABNORMAL LOW (ref >60)
Glucose, Bld: 164 mg/dL — ABNORMAL HIGH (ref 70–99)
Potassium: 4.2 mmol/L (ref 3.5–5.1)
Sodium: 137 mmol/L (ref 135–145)
Total Bilirubin: 0.9 mg/dL (ref 0.3–1.2)
Total Protein: 6.9 g/dL (ref 6.5–8.1)

## 2021-05-07 LAB — GLUCOSE, CAPILLARY
Glucose-Capillary: 176 mg/dL — ABNORMAL HIGH (ref 70–99)
Glucose-Capillary: 158 mg/dL — ABNORMAL HIGH (ref 70–99)
Glucose-Capillary: 149 mg/dL — ABNORMAL HIGH (ref 70–99)
Glucose-Capillary: 214 mg/dL — ABNORMAL HIGH (ref 70–99)

## 2021-05-07 LAB — CULTURE, BLOOD (ROUTINE X 2)
Culture: NO GROWTH
Culture: NO GROWTH

## 2021-05-07 LAB — LACTIC ACID, PLASMA
Lactic Acid, Venous: 3 mmol/L (ref 0.5–1.9)
Lactic Acid, Venous: 1.7 mmol/L (ref 0.5–1.9)

## 2021-05-07 LAB — HEPATITIS PANEL, ACUTE
HCV Ab: NONREACTIVE
Hep A IgM: NONREACTIVE
Hep B C IgM: NONREACTIVE
Hepatitis B Surface Ag: NONREACTIVE

## 2021-05-07 LAB — C-REACTIVE PROTEIN: CRP: 3.7 mg/dL — ABNORMAL HIGH (ref <1.0)

## 2021-05-07 LAB — CK: Total CK: 703 U/L — ABNORMAL HIGH (ref 38–234)

## 2021-05-07 LAB — BRAIN NATRIURETIC PEPTIDE: B Natriuretic Peptide: 294.4 pg/mL — ABNORMAL HIGH (ref 0.0–100.0)

## 2021-05-07 SURGERY — ENDOBRONCHIAL ULTRASOUND (EBUS)
Anesthesia: General

## 2021-05-07 MED ORDER — ONDANSETRON HCL 4 MG/2ML IJ SOLN
INTRAMUSCULAR | Status: DC | PRN
  Administered 2021-05-07: 4 mg via INTRAVENOUS

## 2021-05-07 MED ORDER — ROCURONIUM BROMIDE 10 MG/ML (PF) SYRINGE
PREFILLED_SYRINGE | INTRAVENOUS | Status: DC | PRN
  Administered 2021-05-07: 30 mg via INTRAVENOUS

## 2021-05-07 MED ORDER — SUGAMMADEX SODIUM 200 MG/2ML IV SOLN
INTRAVENOUS | Status: DC | PRN
  Administered 2021-05-07: 200 mg via INTRAVENOUS

## 2021-05-07 MED ORDER — ONDANSETRON HCL 4 MG/2ML IJ SOLN: 4.0000 mg | Freq: Once | INTRAMUSCULAR | Status: DC | PRN

## 2021-05-07 MED ORDER — FENTANYL CITRATE (PF) 100 MCG/2ML IJ SOLN
100.0000 ug | Freq: Once | INTRAMUSCULAR | Status: AC
  Administered 2021-05-07: 50 ug via INTRAVENOUS

## 2021-05-07 MED ORDER — CHLORHEXIDINE GLUCONATE 0.12 % MT SOLN
OROMUCOSAL | Status: AC
  Administered 2021-05-07: 15 mL
  Filled 2021-05-07: qty 15

## 2021-05-07 MED ORDER — LACTATED RINGERS IV SOLN: INTRAVENOUS | Status: AC

## 2021-05-07 MED ORDER — FENTANYL CITRATE (PF) 100 MCG/2ML IJ SOLN: 100.0000 ug | Freq: Once | INTRAMUSCULAR | Status: AC

## 2021-05-07 MED ORDER — PHENOL 1.4 % MT LIQD
1.0000 | OROMUCOSAL | Status: DC | PRN
Start: 2021-05-07 — End: 2021-05-17
  Administered 2021-05-07: 1 via OROMUCOSAL
  Filled 2021-05-07: qty 177

## 2021-05-07 MED ORDER — FENTANYL CITRATE (PF) 100 MCG/2ML IJ SOLN
INTRAMUSCULAR | Status: AC
  Administered 2021-05-07: 50 ug via INTRAVENOUS
  Filled 2021-05-07: qty 2

## 2021-05-07 MED ORDER — SUCCINYLCHOLINE CHLORIDE 200 MG/10ML IV SOSY
PREFILLED_SYRINGE | INTRAVENOUS | Status: DC | PRN
  Administered 2021-05-07: 120 mg via INTRAVENOUS

## 2021-05-07 MED ORDER — PROPOFOL 10 MG/ML IV BOLUS
INTRAVENOUS | Status: DC | PRN
  Administered 2021-05-07: 110 mg via INTRAVENOUS

## 2021-05-07 MED ORDER — FENTANYL CITRATE (PF) 100 MCG/2ML IJ SOLN: 25.0000 ug | INTRAMUSCULAR | Status: DC | PRN

## 2021-05-07 MED ORDER — FENTANYL CITRATE (PF) 250 MCG/5ML IJ SOLN
INTRAMUSCULAR | Status: DC | PRN
  Administered 2021-05-07: 100 ug via INTRAVENOUS

## 2021-05-07 MED ORDER — PHENYLEPHRINE HCL-NACL 10-0.9 MG/250ML-% IV SOLN
INTRAVENOUS | Status: DC | PRN
  Administered 2021-05-07: 25 ug/min via INTRAVENOUS

## 2021-05-07 MED ORDER — LIDOCAINE 2% (20 MG/ML) 5 ML SYRINGE
INTRAMUSCULAR | Status: DC | PRN
  Administered 2021-05-07: 80 mg via INTRAVENOUS

## 2021-05-07 NOTE — Interval H&P Note (Signed)
History and Physical Interval Note:  05/07/2021 12:28 PM  [name]  has presented today for surgery, with the diagnosis of rul mass.  The various methods of treatment have been discussed with the patient and family. After consideration of risks, benefits and other options for treatment, the patient has consented to  Procedure(s): ENDOBRONCHIAL ULTRASOUND (N/A) VIDEO BRONCHOSCOPY WITH ENDOBRONCHIAL NAVIGATION (N/A) as a surgical intervention.  The patient's history has been reviewed, patient examined, no change in status, stable for surgery.  I have reviewed the patient's chart and labs.  Questions were answered to the patient's satisfaction.     Collene Gobble

## 2021-05-07 NOTE — Op Note (Signed)
Video Bronchoscopy with Endobronchial Ultrasound Procedure Note  Date of Operation: 05/07/2021  Pre-op Diagnosis: Right upper lobe mass, mediastinal adenopathy  Post-op Diagnosis: Same  Surgeon: Baltazar Apo  Assistants: None  Anesthesia: General endotracheal anesthesia  Operation: Flexible video fiberoptic bronchoscopy with endobronchial ultrasound and biopsies.  Estimated Blood Loss: Minimal  Complications: None apparent  Indications and History: [name] is a 70 y.o. female with history tobacco, failed to hear of 2.8 x 2.4 rounded right upper lobe mass with surrounding emphysema, right hilar mass lesion with associated mediastinal adenopathy.  Recommendation was made to achieve tissue diagnosis via bronchoscopy.  The risks, benefits, complications, treatment options and expected outcomes were discussed with the patient.  The possibilities of pneumothorax, pneumonia, reaction to medication, pulmonary aspiration, perforation of a viscus, bleeding, failure to diagnose a condition and creating a complication requiring transfusion or operation were discussed with the patient who freely signed the consent.    Description of Procedure: The patient was examined in the preoperative area and history and data from the preprocedure consultation were reviewed. It was deemed appropriate to proceed.  The patient was taken to [facility] endoscopy room 2, identified as [name] and the procedure verified as Flexible Video Fiberoptic Bronchoscopy.  A Time Out was held and the above information confirmed. After being taken to the operating room general anesthesia was initiated and the patient  was orally intubated. The video fiberoptic bronchoscope was introduced via the endotracheal tube and a general inspection was performed which showed normal airways on the left.  Right lower lobe and right middle lobe airways were all normal.  There was a small exophytic endobronchial lesion seen into the  anterior inferior aspect of the right upper lobe bronchus partially occluding the anterior subsegment.  The right upper lobe carina was splayed and thickened.  Endobronchial brushings and endobronchial biopsies were performed on this lesion.  The standard scope was then withdrawn and the endobronchial ultrasound was used to identify and characterize the peritracheal, hilar and bronchial lymph nodes. Inspection showed multiple enlarged nodes mixed density, question with some necrosis on the right.  This included the right hilar mass lesion which was in the area of 10 R.  Also enlargement at 2R, 4R.  There was a small 4L node seen, borderline for pathologic enlargement. Using real-time ultrasound guidance Wang needle biopsies were take from Station 10 R, 4R, 2R nodes and were sent for cytology. The patient tolerated the procedure well without apparent complications. There was no significant blood loss. The bronchoscope was withdrawn. Anesthesia was reversed and the patient was taken to the PACU for recovery.   Samples: 1. Wang needle biopsies from 10 R node 2. Wang needle biopsies from 4R node 3. Wang needle biopsies from 2R node 4.  Endobronchial brushings right upper lobe bronchus 5.  Endobronchial biopsies right upper lobe bronchus  Plans:  The patient will be transferred back to her hospital bed at [facility] when recovered from anesthesia. We will review the cytology, pathology results with the patient when they become available.    Baltazar Apo, MD, PhD 05/07/2021, 3:13 PM [facility] [phone] or if no answer before 7:00PM call [phone] For any issues after 7:00PM please call eLink [phone]

## 2021-05-07 NOTE — Anesthesia Postprocedure Evaluation (Signed)
Anesthesia Post Note  Patient: ALEEHA BOLINE  Procedure(s) Performed: ENDOBRONCHIAL ULTRASOUND (N/A ) VIDEO BRONCHOSCOPY BRONCHIAL BIOPSIES BRONCHIAL BRUSHINGS BRONCHIAL NEEDLE ASPIRATION BIOPSIES HEMOSTASIS CONTROL     Patient location during evaluation: PACU Anesthesia Type: General Level of consciousness: awake and alert Pain management: pain level controlled Vital Signs Assessment: post-procedure vital signs reviewed and stable Respiratory status: spontaneous breathing, nonlabored ventilation, respiratory function stable and patient connected to nasal cannula oxygen Cardiovascular status: blood pressure returned to baseline and stable Postop Assessment: no apparent nausea or vomiting Anesthetic complications: no   No complications documented.  Last Vitals:  Vitals:   05/07/21 1532 05/07/21 1542  BP: 118/67 117/79  Pulse: (!) 113 (!) 105  Resp: 20 15  Temp:    SpO2: 96% 97%    Last Pain:  Vitals:   05/07/21 1542  TempSrc:   PainSc: 0-No pain                 De Libman A.

## 2021-05-07 NOTE — Progress Notes (Signed)
Report given to Alvester Morin with care link. Awaiting arrival to 1436 to retrieve patient.

## 2021-05-07 NOTE — Transfer of Care (Signed)
Immediate Anesthesia Transfer of Care Note  Patient: [name]  Procedure(s) Performed: ENDOBRONCHIAL ULTRASOUND (N/A ) VIDEO BRONCHOSCOPY BRONCHIAL BIOPSIES BRONCHIAL BRUSHINGS BRONCHIAL NEEDLE ASPIRATION BIOPSIES HEMOSTASIS CONTROL  Patient Location: Endoscopy Unit  Anesthesia Type:General  Level of Consciousness: awake and alert   Airway & Oxygen Therapy: Patient Spontanous Breathing and Patient connected to face mask oxygen  Post-op Assessment: Report given to RN, Post -op Vital signs reviewed and stable and Patient moving all extremities  Post vital signs: Reviewed and stable  Last Vitals:  Vitals Value Taken Time  BP 114/74 05/07/21 1512  Temp 36.1 C 05/07/21 1512  Pulse 114 05/07/21 1512  Resp 15 05/07/21 1512  SpO2      Last Pain:  Vitals:   05/07/21 1512  TempSrc: Temporal  PainSc:       Patients Stated Pain Goal: 3 (74/93/55 2174)  Complications: No complications documented.

## 2021-05-07 NOTE — Anesthesia Procedure Notes (Signed)
Procedure Name: Intubation Date/Time: 05/07/2021 2:07 PM Performed by: Bryson Corona, CRNA Pre-anesthesia Checklist: Patient identified, Emergency Drugs available, Suction available and Patient being monitored Patient Re-evaluated:Patient Re-evaluated prior to induction Oxygen Delivery Method: Circle System Utilized Preoxygenation: Pre-oxygenation with 100% oxygen Induction Type: IV induction and Rapid sequence Laryngoscope Size: Glidescope and 3 Grade View: Grade I Tube type: Oral Tube size: 8.5 mm Number of attempts: 1 Airway Equipment and Method: Stylet and Oral airway Placement Confirmation: ETT inserted through vocal cords under direct vision,  positive ETCO2 and breath sounds checked- equal and bilateral Secured at: 22 cm Tube secured with: Tape Dental Injury: Teeth and Oropharynx as per pre-operative assessment  Comments: Elective glidescope intubation for Covid precautions.

## 2021-05-07 NOTE — Progress Notes (Signed)
[facility] Progress Note  Patient: [name]    GEX:[id number]  DOA: 05/02/2021     Date of Service: the patient was seen and examined on 05/07/2021  Brief hospital course: Past medical history of chronic A. fib on Eliquis, bladder cancer, HTN, CAD, OSA, hypothyroidism, CKD 3A, type II DM, COVID infection.  Presents with complaints over biopsing back pain. Appears to have postobstructive pneumonia as well as compression fracture on T8.  Neurosurgery recommends steroids and pain control. Also has a lung mass that requires biopsy.  Bronchoscopy currently scheduled on 5/12. Currently plan is pain control and treatment for postobstructive pneumonia.  Assessment and Plan: 1.  Postobstructive pneumonia Acute hypoxic respiratory failure, POA 74% on room air on admission Identified to have a lung mass. Size was 4.4 x 2.3 cm on 3/20 and currently 5.1 x 3.2 cm on 5/7 CT scan. Leukocytosis as well as cough also present. Hypoxic, currently.  Does not use any oxygen at her baseline. Blood cultures currently negative. Lung mass appears to have increased significantly in size and therefore suspected to have postobstructive pneumonia.   leukocytosis starts to improve, continue  unasyn.  Incentive spirometry and flutter device also recommended.  2.  Lung mass, right upper lobe with metastasis CT scan shows right upper lobe lung mass with metastatic mediastinal and right hilar adenopathy as well as bony metastasis. Recently treated for L4 compression fracture and appears to have T8 compression fracture. CT scan negative for PE. The mass appears to have increased in size over few weeks. S/p  bronchoscopy and biopsy on 5/12,  resume  eliquis when ok with pulmonology Appreciate pulmonary consultation.    3.  COVID-19 infection Reportedly first positive test on 04/22/2021. Again positive on 5/7, CT value 34.2 Treated with monupiravir, 4/28-5/3. Currently while the patient does have worsening  hypoxia and bilateral crackles CT scan negative for any worsening infiltrate bilaterally and therefore no indication of further therapy for now. Due to immunosuppressed status from underline metastatic cancer will continue need to be in isolation   4.  T8 compression fracture Seen on CT scan. Patient presented with worsening pain. Likely pathological fracture. Appreciate neurosurgery consultation. Recommend steroids as well as radiation therapy to her spine. Started on Decadron.  Currently pain significantly better.  Also weakness of the left leg improving likely secondary to control of the pain. Discussed with radiation oncology, Dr. Sondra Come.  Given that the patient is still in isolation patient will set up for simulation and further therapy based on the biopsy results outpatient.  5.  Persistent A. fib with RVR Continue Cardizem, still has some RVR. Will increase the dose from 240 mg to 300 mg. Continue to hold Eliquis in case pulmonary performed biopsy Received 1 dose of therapeutic Lovenox on 5/10. Resume eliquis when ok with pulmonology after bronchoscopy on 5/12  6.  Type 2 diabetes mellitus, uncontrolled with hyperglycemia Hemoglobin A1c 6.2. Continue to monitor with sliding scale insulin.  7.  Hypothyroidism  continue Synthroid.  8.  Acute kidney injury on CKD 3A Baseline serum creatinine 1.05. On presentation serum creatinine 1.37. Currently improving.  Monitor.  9.  Obesity Placing the patient at high risk of poor outcome in the setting of COVID-19 illness. Body mass index is 32.95 kg/m.   10.  Hypokalemia Replaced.  Monitor.  11.  Elevated LFT Unclear etiology  hepatobiliary: Interval dilated gallbladder without visible gallstones or wall thickening. No pericholecystic fluid. Unremarkable liver.   CK mildly elevated, hepatitis panel in  process DC Tylenol, hold statin Repeat cmp  12.  Left buttock unstageable ulcer, POA Continue foam dressing.   Wound care consulted.  FTT: PT recommended SNF placement, husband and patient agrees  Body mass index is 32.95 kg/m.  Nutrition Problem: Increased nutrient needs Etiology: acute illness,catabolic illness ([id number]-37 infections; newly dx lung mass with spinal mets) Interventions: Interventions: Ensure Enlive (each supplement provides 350kcal and 20 grams of protein),Prostat,Magic cup  Pressure Injury 05/02/21 Hip Left Unstageable - Full thickness tissue loss in which the base of the injury is covered by slough (yellow, tan, gray, green or brown) and/or eschar (tan, brown or black) in the wound bed. black wound bed (Active)  05/02/21 2130  Location: Hip  Location Orientation: Left  Staging: Unstageable - Full thickness tissue loss in which the base of the injury is covered by slough (yellow, tan, gray, green or brown) and/or eschar (tan, brown or black) in the wound bed.  Wound Description (Comments): black wound bed  Present on Admission: Yes    Diet: Carb modified diet DVT Prophylaxis:   Place and maintain sequential compression device Start: 05/03/21 1142  Advance goals of care discussion: Full code  Family Communication: husband at bedside     Disposition:  Status is: Inpatient  Remains inpatient appropriate because: Ongoing pain control need, SNF needs as well as bronchoscopy on 5/12.  Dispo: The patient is from: Home              Anticipated d/c is to: SNF              Patient currently is not medically stable to d/c.   Difficult to place patient   Subjective:    She was at [facility] most of the day today for procedure, she is seen after coming back to [place] Reports pain is better controlled, last bm was prior to admission, no ab pain.  No nausea no vomiting.  Minimal oral intake. Feeling very weak, She is on room air, no fever, she reports now cough is productive  Husband at bedside  Physical Exam:  General: appear very weak and pale Cardiovascular: S1 and  S2 Present, no Murmur, Respiratory: good respiratory effort, Bilateral Air entry present and CTA, no Crackles, no wheezes Abdomen: Bowel Sound present, Soft and no tenderness Extremities: no Pedal edema Neurology: alert and oriented to time, place, and person affect appropriate. no new focal deficit Gait not checked due to patient safety concerns     Vitals:   05/07/21 1512 05/07/21 1523 05/07/21 1532 05/07/21 1542  BP: 114/74 104/80 118/67 117/79  Pulse: (!) 114 (!) 114 (!) 113 (!) 105  Resp: 15 17 20 15   Temp: (!) 96.9 F (36.1 C)     TempSrc: Temporal     SpO2: 96% 93% 96% 97%  Weight:      Height:        Intake/Output Summary (Last 24 hours) at 05/07/2021 1740 Last data filed at 05/07/2021 1621 Gross per 24 hour  Intake 999.87 ml  Output 1400 ml  Net -400.13 ml   Filed Weights   05/02/21 1347  Weight: 84.4 kg   Data Reviewed: I have personally reviewed and interpreted daily labs, tele strips, imaging. I reviewed all nursing notes, pharmacy notes, vitals, pertinent old records I have discussed plan of care as described above with RN and patient/family.  CBC: Recent Labs  Lab 05/02/21 1450 05/03/21 0813 05/06/21 0426 05/07/21 0350  WBC 18.7* 17.3* 17.6* 14.8*  NEUTROABS 15.3*  --  15.4* 13.1*  HGB 12.1 10.9* 9.9* 10.5*  HCT 37.7 35.6* 32.5* 33.4*  MCV 84.9 88.3 89.0 85.9  PLT 331 261 287 193   Basic Metabolic Panel: Recent Labs  Lab 05/02/21 1450 05/02/21 2123 05/03/21 0400 05/06/21 0426 05/07/21 0350  NA 136 134* 138 135 137  K 3.0* 3.3* 3.7 3.6 4.2  CL 94* 99 104 98 100  CO2 26 23 23 26 27   GLUCOSE 133* 106* 85 168* 164*  BUN 20 16 15  25* 25*  CREATININE 1.37* 1.18* 1.16* 0.79 1.10*  CALCIUM 10.7* 9.4 9.6 9.2 9.4  MG 2.0  --   --  1.9  --    Studies: No results found.  Scheduled Meds: . (feeding supplement) PROSource Plus  30 mL Oral BID BM  . vitamin C  500 mg Oral Daily  . dexamethasone (DECADRON) injection  4 mg Intravenous Q6H  .  diltiazem  300 mg Oral Daily  . feeding supplement  237 mL Oral BID BM  . insulin aspart  0-5 Units Subcutaneous QHS  . insulin aspart  0-9 Units Subcutaneous TID WC  . levothyroxine  100 mcg Oral Q0600  . nutrition supplement (JUVEN)  1 packet Oral BID BM  . polyethylene glycol  17 g Oral BID  . senna-docusate  1 tablet Oral BID  . zinc sulfate  220 mg Oral Daily   Continuous Infusions: . ampicillin-sulbactam (UNASYN) IV 3 g (05/07/21 1726)  . lactated ringers Stopped (05/07/21 1536)   PRN Meds: morphine injection, oxyCODONE, phenol  Time spent: 25 minutes  Author: Florencia Reasons, MD PhD FACP [facility] 05/07/2021 5:40 PM  To reach On-call, see care teams to locate the attending and reach out via [email or web address]. Between 7PM-7AM, please contact night-coverage If you still have difficulty reaching the attending provider, please page the [facility] (Director on Call) for [facility] on amion for assistance.

## 2021-05-07 NOTE — Progress Notes (Signed)
Patient to [place], via carelink. Alert and oriented upon leaving.

## 2021-05-07 NOTE — Anesthesia Preprocedure Evaluation (Addendum)
Anesthesia Evaluation  Patient identified by MRN, date of birth, ID band Patient awake    Reviewed: Allergy & Precautions, NPO status , Patient's Chart, lab work & pertinent test results, reviewed documented beta blocker date and time   Airway Mallampati: III  TM Distance: >3 FB Neck ROM: Full    Dental  (+) Edentulous Upper, Edentulous Lower   Pulmonary sleep apnea and Continuous Positive Airway Pressure Ventilation , pneumonia, unresolved, Patient abstained from smoking., former smoker,  RUL lung mass Post obstructive pneumonia- diagnosed 05/02/21 Lung Ca with mediastinal and bony mets  Covid +, 4/27 and 5/3, treated with Monupivir  Decreased breath sound right upper lung fields  breath sounds clear to auscultation + decreased breath sounds      Cardiovascular hypertension, Pt. on medications + CAD and + Past MI  Normal cardiovascular exam+ dysrhythmias Atrial Fibrillation  Rhythm:Regular Rate:Normal     Neuro/Psych Peripheral neuropathy Back pain  Neuromuscular disease negative psych ROS   GI/Hepatic GERD  Medicated and Controlled,Elevated LFT's  Cholelithiaisis   Endo/Other  diabetes, Poorly Controlled, Type 2Hypothyroidism Hyperlipidemia  Renal/GU Renal InsufficiencyRenal disease  negative genitourinary   Musculoskeletal  (+) Arthritis , Osteoarthritis,  T8 and L4 compression Fx's Back pain   Abdominal (+) + obese,   Peds  Hematology  (+) anemia , Chronic anticoagulation   Anesthesia Other Findings   Reproductive/Obstetrics                           Anesthesia Physical Anesthesia Plan  ASA: III  Anesthesia Plan: General   Post-op Pain Management:    Induction:   PONV Risk Score and Plan: 4 or greater and Treatment may vary due to age or medical condition and Ondansetron  Airway Management Planned: Oral ETT  Additional Equipment:   Intra-op Plan:   Post-operative Plan:    Informed Consent: I have reviewed the patients History and Physical, chart, labs and discussed the procedure including the risks, benefits and alternatives for the proposed anesthesia with the patient or authorized representative who has indicated his/her understanding and acceptance.       Plan Discussed with: CRNA and Anesthesiologist  Anesthesia Plan Comments:        Anesthesia Quick Evaluation

## 2021-05-07 NOTE — Care Management Important Message (Signed)
Important Message  Patient Details IM Letter given to the Patient. Name: [name] MRN: [id number] Date of Birth: 02-27-1951   Medicare Important Message Given:  Yes     Kerin Salen 05/07/2021, 12:52 PM

## 2021-05-08 DIAGNOSIS — C7951 Secondary malignant neoplasm of bone: Secondary | ICD-10-CM | POA: Diagnosis not present

## 2021-05-08 DIAGNOSIS — C3490 Malignant neoplasm of unspecified part of unspecified bronchus or lung: Secondary | ICD-10-CM | POA: Diagnosis not present

## 2021-05-08 LAB — CBC WITH DIFFERENTIAL/PLATELET
Abs Immature Granulocytes: 0.12 10*3/uL — ABNORMAL HIGH (ref 0.00–0.07)
Basophils Absolute: 0 10*3/uL (ref 0.0–0.1)
Basophils Relative: 0 %
Eosinophils Absolute: 0 10*3/uL (ref 0.0–0.5)
Eosinophils Relative: 0 %
HCT: 28.1 % — ABNORMAL LOW (ref 36.0–46.0)
Hemoglobin: 8.8 g/dL — ABNORMAL LOW (ref 12.0–15.0)
Immature Granulocytes: 1 %
Lymphocytes Relative: 5 %
Lymphs Abs: 0.8 10*3/uL (ref 0.7–4.0)
MCH: 27.1 pg (ref 26.0–34.0)
MCHC: 31.3 g/dL (ref 30.0–36.0)
MCV: 86.5 fL (ref 80.0–100.0)
Monocytes Absolute: 0.8 10*3/uL (ref 0.1–1.0)
Monocytes Relative: 6 %
Neutro Abs: 12.8 10*3/uL — ABNORMAL HIGH (ref 1.7–7.7)
Neutrophils Relative %: 88 %
Platelets: 286 10*3/uL (ref 150–400)
RBC: 3.25 MIL/uL — ABNORMAL LOW (ref 3.87–5.11)
RDW: 16.1 % — ABNORMAL HIGH (ref 11.5–15.5)
WBC: 14.6 10*3/uL — ABNORMAL HIGH (ref 4.0–10.5)
nRBC: 0 % (ref 0.0–0.2)

## 2021-05-08 LAB — COMPREHENSIVE METABOLIC PANEL
ALT: 190 U/L — ABNORMAL HIGH (ref 0–44)
AST: 163 U/L — ABNORMAL HIGH (ref 15–41)
Albumin: 2.2 g/dL — ABNORMAL LOW (ref 3.5–5.0)
Alkaline Phosphatase: 112 U/L (ref 38–126)
Anion gap: 7 (ref 5–15)
BUN: 22 mg/dL (ref 8–23)
CO2: 29 mmol/L (ref 22–32)
Calcium: 9.1 mg/dL (ref 8.9–10.3)
Chloride: 100 mmol/L (ref 98–111)
Creatinine, Ser: 0.79 mg/dL (ref 0.44–1.00)
GFR, Estimated: >60 mL/min (ref >60)
Glucose, Bld: 177 mg/dL — ABNORMAL HIGH (ref 70–99)
Potassium: 3.5 mmol/L (ref 3.5–5.1)
Sodium: 136 mmol/L (ref 135–145)
Total Bilirubin: 0.3 mg/dL (ref 0.3–1.2)
Total Protein: 6.4 g/dL — ABNORMAL LOW (ref 6.5–8.1)

## 2021-05-08 LAB — BRAIN NATRIURETIC PEPTIDE: B Natriuretic Peptide: 373 pg/mL — ABNORMAL HIGH (ref 0.0–100.0)

## 2021-05-08 LAB — GLUCOSE, CAPILLARY
Glucose-Capillary: 194 mg/dL — ABNORMAL HIGH (ref 70–99)
Glucose-Capillary: 267 mg/dL — ABNORMAL HIGH (ref 70–99)
Glucose-Capillary: 201 mg/dL — ABNORMAL HIGH (ref 70–99)
Glucose-Capillary: 186 mg/dL — ABNORMAL HIGH (ref 70–99)

## 2021-05-08 LAB — D-DIMER, QUANTITATIVE: D-Dimer, Quant: 0.7 ug/mL-FEU — ABNORMAL HIGH (ref 0.00–0.50)

## 2021-05-08 LAB — CK: Total CK: 499 U/L — ABNORMAL HIGH (ref 38–234)

## 2021-05-08 LAB — C-REACTIVE PROTEIN: CRP: 2.4 mg/dL — ABNORMAL HIGH (ref <1.0)

## 2021-05-08 MED ORDER — MAGNESIUM HYDROXIDE 400 MG/5ML PO SUSP
15.0000 mL | Freq: Once | ORAL | Status: AC
  Administered 2021-05-08: 15 mL via ORAL
  Filled 2021-05-08: qty 30

## 2021-05-08 MED ORDER — APIXABAN 5 MG PO TABS
5.0000 mg | ORAL_TABLET | Freq: Two times a day (BID) | ORAL | Status: DC
  Administered 2021-05-08 – 2021-05-15 (×16): 5 mg via ORAL
  Filled 2021-05-08: qty 2
  Filled 2021-05-08 (×8): qty 1
  Filled 2021-05-08: qty 2
  Filled 2021-05-08 (×4): qty 1
  Filled 2021-05-08: qty 2
  Filled 2021-05-08: qty 1

## 2021-05-08 NOTE — Progress Notes (Signed)
Okay to resume AC. I will touch base with Dr. Lamonte Sakai regarding f/u and place it in [place].   Erskine Emery MD PCCM

## 2021-05-08 NOTE — Progress Notes (Signed)
Physical Therapy Treatment Patient Details Name: [name] MRN: [id number] DOB: February 28, 1951 Today's Date: 05/08/2021    History of Present Illness Pt is 70 yr old female former smoker with persistent complaints of back pain was in ER on 03/15/21.  She had CT chest then that showed 2.8 x 2.4 cm rounded lesion in Rt upper lobe.  Also noted to have changes of centrilobular emphysema.  She was discharged from ER on levaquin and pain medication.  Positive for COVID 19 on 04/22/21.  She had outpatient treatment with monupiravir.  She presented to hospital again on 05/02/21 with worsening back pain. CT chest imaging from 05/02/21 showed increased mediastinal adenopathy and Rt upper lobe lesion now measuring 5.1 x 3.2 cm.  Pt had bronchoscopy on 05/07/21. MRI: compression fracture of T8 suggestive of pathologic fracture, moderate spinal canal stenosis, metastatic lesions in L2/L4/S1    PT Comments    Pt requiring mod A of 2 for transfers.  She fatigued very easily with gait requiring max A to finish getting to chair. Required rest breaks between transfers.  Her VSS on 4 L O2. She did have pain meds prior to therapy and tolerated therapy well in regards to pain control. Continue to recommend SNF    Follow Up Recommendations  SNF     Equipment Recommendations  None recommended by PT    Recommendations for Other Services       Precautions / Restrictions Precautions Precautions: Fall;Back    Mobility  Bed Mobility Overal bed mobility: Needs Assistance Bed Mobility: Rolling;Sidelying to Sit Rolling: Mod assist Sidelying to sit: Mod assist;+2 for physical assistance;HOB elevated       General bed mobility comments: cues for log roll technique, assist to perform roll with pt reaching for bed rail, assist for upper and lower body to sit upright. increased time.    Transfers Overall transfer level: Needs assistance Equipment used: Rolling walker (2 wheeled) Transfers: Sit to/from Stand Sit  to Stand: Mod assist;+2 physical assistance         General transfer comment: Performed sit to stand x 3 during session requiring mod A of 2 , each time required more assist despite rest breaks  Ambulation/Gait Ambulation/Gait assistance: Mod assist;+2 physical assistance;Max assist Gait Distance (Feet): 3 Feet Assistive device: Rolling walker (2 wheeled) Gait Pattern/deviations: Step-to pattern;Trunk flexed Gait velocity: decreased   General Gait Details: Pt taking a few steps to chair but fatigues quickly with increasing knee flexion requiring max A to finsh getting to chair.   Stairs             Wheelchair Mobility    Modified Rankin (Stroke Patients Only)       Balance Overall balance assessment: Needs assistance Sitting-balance support: No upper extremity supported Sitting balance-Leahy Scale: Fair     Standing balance support: Bilateral upper extremity supported Standing balance-Leahy Scale: Poor Standing balance comment: reliant on UE                            Cognition Arousal/Alertness: Awake/alert Behavior During Therapy: WFL for tasks assessed/performed Overall Cognitive Status: Within Functional Limits for tasks assessed                                        Exercises      General Comments General comments (skin integrity, edema, etc.): VSS on  4 L O2      Pertinent Vitals/Pain Pain Assessment: Faces Faces Pain Scale: Hurts little more Pain Location: back Pain Descriptors / Indicators: Grimacing;Discomfort;Aching Pain Intervention(s): Limited activity within patient's tolerance;Monitored during session;Premedicated before session;Repositioned    Home Living                      Prior Function            PT Goals (current goals can now be found in the care plan section) Acute Rehab PT Goals PT Goal Formulation: With patient Time For Goal Achievement: 05/18/21 Potential to Achieve Goals:  Good Progress towards PT goals: Progressing toward goals    Frequency    Min 2X/week      PT Plan Current plan remains appropriate    Co-evaluation              AM-PAC PT "6 Clicks" Mobility   Outcome Measure  Help needed turning from your back to your side while in a flat bed without using bedrails?: Total Help needed moving from lying on your back to sitting on the side of a flat bed without using bedrails?: Total Help needed moving to and from a bed to a chair (including a wheelchair)?: Total Help needed standing up from a chair using your arms (e.g., wheelchair or bedside chair)?: Total Help needed to walk in hospital room?: Total Help needed climbing 3-5 steps with a railing? : Total 6 Click Score: 6    End of Session Equipment Utilized During Treatment: Oxygen;Gait belt Activity Tolerance: Patient limited by fatigue Patient left: in chair;with call bell/phone within reach;with chair alarm set Nurse Communication: Mobility status (recommended pivot A of 2) PT Visit Diagnosis: Difficulty in walking, not elsewhere classified (R26.2);Muscle weakness (generalized) (M62.81)     Time: 4163-8453 PT Time Calculation (min) (ACUTE ONLY): 20 min  Charges:  $Therapeutic Activity: 8-22 mins                     Abran Richard, PT Acute Rehab Services Pager [phone] [facility] 05/08/2021, 1:01 PM

## 2021-05-08 NOTE — Progress Notes (Signed)
[facility] Progress Note  Patient: [name]    ONG:[id number]  DOA: 05/02/2021     Date of Service: the patient was seen and examined on 05/08/2021  Brief hospital course: Past medical history of chronic A. fib on Eliquis, bladder cancer, HTN, CAD, OSA, hypothyroidism, CKD 3A, type II DM, COVID infection.  Presents with complaints over biopsing back pain. Appears to have postobstructive pneumonia as well as compression fracture on T8.  Neurosurgery recommends steroids and pain control. Also has a lung mass that requires biopsy.  Bronchoscopy currently scheduled on 5/12. Currently plan is pain control and treatment for postobstructive pneumonia.  Assessment and Plan: 1.  Postobstructive pneumonia Acute hypoxic respiratory failure, POA 74% on room air on admission Identified to have a lung mass. Size was 4.4 x 2.3 cm on 3/20 and currently 5.1 x 3.2 cm on 5/7 CT scan. Leukocytosis as well as cough also present. Hypoxic, currently.  Does not use any oxygen at her baseline. Blood cultures currently negative. Lung mass appears to have increased significantly in size and therefore suspected to have postobstructive pneumonia.   leukocytosis starts to improve, continue  unasyn day 3 of 7.  Incentive spirometry and flutter device also recommended. Sputum culture obtained on 5/12, result pending   2.  Lung mass, right upper lobe with metastasis CT scan shows right upper lobe lung mass with metastatic mediastinal and right hilar adenopathy as well as bony metastasis. Recently treated for L4 compression fracture and appears to have T8 compression fracture. CT scan negative for PE. The mass appears to have increased in size over few weeks. S/p  bronchoscopy and biopsy on 5/12,  resumed  eliquis on 5/13 after discussion with pulmonology  Appreciate pulmonary consultation.    3.  COVID-19 infection Reportedly first positive test on 04/22/2021. Again positive on 5/7, CT value  34.2 Treated with monupiravir, 4/28-5/3. Currently while the patient does have worsening hypoxia and bilateral crackles CT scan negative for any worsening infiltrate bilaterally and therefore no indication of further therapy for now. Due to immunosuppressed status from underline metastatic cancer will continue need to be in isolation Plan to come off isolation on 5/17 ( 21days from 4/27)  4.  T8 compression fracture Seen on CT scan. Patient presented with worsening pain. Likely pathological fracture. Seen by  neurosurgery Dr Annette Stable on 5/9.  Details please refer to original notes, continue  Steroids as recommended   Dr Posey Pronto Discussed with radiation oncology, Dr. Sondra Come.  Given that the patient is still in isolation patient will set up for simulation and further therapy based on the biopsy results outpatient. Will need to discuss with rad onc again on Monday, as patient will come off isolation on 5/17  5.  Persistent A. fib with RVR Heart rate controlled after cardizem  dose increase from 240 mg to 300 mg. Eliquis held for procedure, resumed on 5/ 13  6.  Type 2 diabetes mellitus, uncontrolled with hyperglycemia Hemoglobin A1c 6.2. Continue to monitor with sliding scale insulin.  7.  Hypothyroidism  continue Synthroid.  8.  Acute kidney injury on CKD 3A Baseline serum creatinine 1.05. On presentation serum creatinine 1.37. cr today  On 5/13 is 0.79   9.  Obesity Placing the patient at high risk of poor outcome in the setting of COVID-19 illness. Body mass index is 32.95 kg/m.   10.  Hypokalemia Replaced.  Monitor.  11.  Elevated LFT, was normal in 02/2021, started to increase from 5/7 Unclear etiology could  be from covid infection hepatobiliary: Interval dilated gallbladder without visible gallstones or wall thickening. No pericholecystic fluid. Unremarkable liver.   CK mildly elevated, hepatitis panel negative  DC Tylenol, hold statin Repeat cmp  12.  Left buttock  unstageable ulcer, POA Continue foam dressing.  Wound care consulted.  FTT: PT recommended SNF placement, husband and patient agrees  Body mass index is 32.95 kg/m.  Nutrition Problem: Increased nutrient needs Etiology: acute illness,catabolic illness (DZHGD-92 infections; newly dx lung mass with spinal mets) Interventions: Interventions: Ensure Enlive (each supplement provides 350kcal and 20 grams of protein),Prostat,Magic cup  Pressure Injury 05/02/21 Hip Left Unstageable - Full thickness tissue loss in which the base of the injury is covered by slough (yellow, tan, gray, green or brown) and/or eschar (tan, brown or black) in the wound bed. black wound bed (Active)  05/02/21 2130  Location: Hip  Location Orientation: Left  Staging: Unstageable - Full thickness tissue loss in which the base of the injury is covered by slough (yellow, tan, gray, green or brown) and/or eschar (tan, brown or black) in the wound bed.  Wound Description (Comments): black wound bed  Present on Admission: Yes    Diet: Carb modified diet DVT Prophylaxis:   Place and maintain sequential compression device Start: 05/03/21 1142  Advance goals of care discussion: Full code  Family Communication: husband at bedside on 5/12 and 5/13    Disposition:  Status is: Inpatient  Remains inpatient appropriate because: Ongoing pain control need, SNF needs as well as bronchoscopy on 5/12.  Dispo: The patient is from: Home              Anticipated d/c is to: SNF              Patient currently is not medically stable to d/c.   Difficult to place patient   Subjective:    She is very weak, needs two assist to get up from chair Reports pain is better controlled, Still no bm  She is on room air, no fever, I did not hear cough today Husband at bedside  Physical Exam:  General: appear very weak and pale Cardiovascular: S1 and S2 Present, no Murmur, Respiratory: good respiratory effort, Bilateral Air entry  present and CTA, no Crackles, no wheezes Abdomen: Bowel Sound present, Soft and no tenderness Extremities: no Pedal edema Neurology: alert and oriented to time, place, and person affect appropriate. no new focal deficit Gait not checked due to patient safety concerns     Vitals:   05/07/21 1744 05/07/21 2203 05/08/21 0528 05/08/21 1426  BP: 121/81 117/70 115/68 103/66  Pulse: 92 89 90 83  Resp:  20 20 15   Temp: 97.8 F (36.6 C) 97.7 F (36.5 C) 98 F (36.7 C) 98.1 F (36.7 C)  TempSrc: Oral Oral Oral Oral  SpO2: 98% 97% 96% 95%  Weight:      Height:        Intake/Output Summary (Last 24 hours) at 05/08/2021 1553 Last data filed at 05/08/2021 1303 Gross per 24 hour  Intake 240 ml  Output 1650 ml  Net -1410 ml   Filed Weights   05/02/21 1347  Weight: 84.4 kg   Data Reviewed: I have personally reviewed and interpreted daily labs, tele strips, imaging. I reviewed all nursing notes, pharmacy notes, vitals, pertinent old records I have discussed plan of care as described above with RN and patient/family.  CBC: Recent Labs  Lab 05/02/21 1450 05/03/21 0813 05/06/21 0426 05/07/21 0350 05/08/21 4268  WBC 18.7* 17.3* 17.6* 14.8* 14.6*  NEUTROABS 15.3*  --  15.4* 13.1* 12.8*  HGB 12.1 10.9* 9.9* 10.5* 8.8*  HCT 37.7 35.6* 32.5* 33.4* 28.1*  MCV 84.9 88.3 89.0 85.9 86.5  PLT 331 261 287 250 588   Basic Metabolic Panel: Recent Labs  Lab 05/02/21 1450 05/02/21 2123 05/03/21 0400 05/06/21 0426 05/07/21 0350 05/08/21 0417  NA 136 134* 138 135 137 136  K 3.0* 3.3* 3.7 3.6 4.2 3.5  CL 94* 99 104 98 100 100  CO2 26 23 23 26 27 29   GLUCOSE 133* 106* 85 168* 164* 177*  BUN 20 16 15  25* 25* 22  CREATININE 1.37* 1.18* 1.16* 0.79 1.10* 0.79  CALCIUM 10.7* 9.4 9.6 9.2 9.4 9.1  MG 2.0  --   --  1.9  --   --    Studies: No results found.  Scheduled Meds: . (feeding supplement) PROSource Plus  30 mL Oral BID BM  . apixaban  5 mg Oral BID  . vitamin C  500 mg Oral Daily   . dexamethasone (DECADRON) injection  4 mg Intravenous Q6H  . diltiazem  300 mg Oral Daily  . feeding supplement  237 mL Oral BID BM  . insulin aspart  0-5 Units Subcutaneous QHS  . insulin aspart  0-9 Units Subcutaneous TID WC  . levothyroxine  100 mcg Oral Q0600  . magnesium hydroxide  15 mL Oral Once  . nutrition supplement (JUVEN)  1 packet Oral BID BM  . polyethylene glycol  17 g Oral BID  . senna-docusate  1 tablet Oral BID  . zinc sulfate  220 mg Oral Daily   Continuous Infusions: . ampicillin-sulbactam (UNASYN) IV 3 g (05/08/21 1303)   PRN Meds: morphine injection, oxyCODONE, phenol  Time spent: 25 minutes  Author: Florencia Reasons, MD PhD FACP [facility] 05/08/2021 3:53 PM  To reach On-call, see care teams to locate the attending and reach out via [email or web address]. Between 7PM-7AM, please contact night-coverage If you still have difficulty reaching the attending provider, please page the [facility] (Director on Call) for [facility] on amion for assistance.

## 2021-05-09 DIAGNOSIS — C7951 Secondary malignant neoplasm of bone: Secondary | ICD-10-CM | POA: Diagnosis not present

## 2021-05-09 DIAGNOSIS — C3490 Malignant neoplasm of unspecified part of unspecified bronchus or lung: Secondary | ICD-10-CM | POA: Diagnosis not present

## 2021-05-09 LAB — BRAIN NATRIURETIC PEPTIDE: B Natriuretic Peptide: 299.4 pg/mL — ABNORMAL HIGH (ref 0.0–100.0)

## 2021-05-09 LAB — GLUCOSE, CAPILLARY
Glucose-Capillary: 207 mg/dL — ABNORMAL HIGH (ref 70–99)
Glucose-Capillary: 208 mg/dL — ABNORMAL HIGH (ref 70–99)
Glucose-Capillary: 327 mg/dL — ABNORMAL HIGH (ref 70–99)
Glucose-Capillary: 247 mg/dL — ABNORMAL HIGH (ref 70–99)

## 2021-05-09 LAB — C-REACTIVE PROTEIN: CRP: 1.8 mg/dL — ABNORMAL HIGH (ref <1.0)

## 2021-05-09 LAB — D-DIMER, QUANTITATIVE: D-Dimer, Quant: 0.66 ug/mL-FEU — ABNORMAL HIGH (ref 0.00–0.50)

## 2021-05-09 MED ORDER — MAGNESIUM HYDROXIDE 400 MG/5ML PO SUSP
15.0000 mL | Freq: Two times a day (BID) | ORAL | Status: AC
  Administered 2021-05-09 – 2021-05-10 (×2): 15 mL via ORAL
  Filled 2021-05-09 (×2): qty 30

## 2021-05-09 MED ORDER — DEXAMETHASONE SODIUM PHOSPHATE 4 MG/ML IJ SOLN
4.0000 mg | Freq: Two times a day (BID) | INTRAMUSCULAR | Status: DC
  Administered 2021-05-10 – 2021-05-13 (×8): 4 mg via INTRAVENOUS
  Filled 2021-05-09 (×8): qty 1

## 2021-05-09 MED ORDER — CHLORHEXIDINE GLUCONATE 0.12 % MT SOLN
15.0000 mL | Freq: Two times a day (BID) | OROMUCOSAL | Status: DC
  Administered 2021-05-09 – 2021-05-15 (×14): 15 mL via OROMUCOSAL
  Filled 2021-05-09 (×13): qty 15

## 2021-05-09 MED ORDER — SODIUM CHLORIDE 0.9 % IV SOLN
3.0000 g | Freq: Four times a day (QID) | INTRAVENOUS | Status: AC
  Administered 2021-05-09 – 2021-05-10 (×5): 3 g via INTRAVENOUS
  Filled 2021-05-09: qty 8
  Filled 2021-05-09: qty 3
  Filled 2021-05-09: qty 8
  Filled 2021-05-09: qty 3
  Filled 2021-05-09: qty 8

## 2021-05-09 MED ORDER — ORAL CARE MOUTH RINSE
15.0000 mL | Freq: Two times a day (BID) | OROMUCOSAL | Status: DC
  Administered 2021-05-09 – 2021-05-17 (×10): 15 mL via OROMUCOSAL

## 2021-05-09 NOTE — Progress Notes (Signed)
Pain not well controlled. Made MD Xu aware for possible palliative consult on pain management. Husband is at bedside and is aware of this recommendation. No other needs identified.

## 2021-05-09 NOTE — Progress Notes (Signed)
[facility] Progress Note  Patient: [name]    BWL:[id number]  DOA: 05/02/2021     Date of Service: the patient was seen and examined on 05/09/2021  Brief hospital course: Past medical history of chronic A. fib on Eliquis, bladder cancer, HTN, CAD, OSA, hypothyroidism, CKD 3A, type II DM, COVID infection.  Presents with complaints over biopsing back pain. Appears to have postobstructive pneumonia as well as compression fracture on T8.  Neurosurgery recommends steroids and pain control. Also has a lung mass .  S/ p Bronchoscopy/biopsy on 5/12. Currently plan is pain control and treatment for postobstructive pneumonia.  Need to call rad onc on Monday regarding when to start radiation treatment Will be off covid isolation on 5/17 Getting closer to discharge to snf  Assessment and Plan: Postobstructive pneumonia Acute hypoxic respiratory failure, POA 74% on room air on admission lung mass, Size was 4.4 x 2.3 cm on 3/20 and currently 5.1 x 3.2 cm on 5/7 CT scan. Does not use any oxygen at her baseline. Blood cultures currently negative, sputum culture normal flora  suspected to have postobstructive pneumonia, on unasyn    Incentive spirometry and flutter device also recommended.    Lung mass, right upper lobe with metastasis CT scan shows right upper lobe lung mass with metastatic mediastinal and right hilar adenopathy as well as bony metastasis. Recently treated for L4 compression fracture and appears to have T8 compression fracture. CT scan negative for PE. The mass appears to have increased in size over few weeks. S/p  bronchoscopy and biopsy on 5/12,  resumed  eliquis on 5/13 after discussion with pulmonology  Appreciate pulmonary consultation.  Need to touch base with rad onc on Monday regarding initiation XRT,     T8 compression fracture Seen on CT scan,  presented with worsening back  Pain, Likely pathological fracture. Seen by  neurosurgery Dr Annette Stable on 5/9.   Details please refer to original notes, continue  Steroids as recommended   Dr Posey Pronto Discussed with radiation oncology, Dr. Sondra Come.  Given that the patient is still in isolation patient will set up for simulation and further therapy based on the biopsy results outpatient. Will need to discuss with rad onc again on Monday per pccm request,  patient will come off isolation on 5/17  COVID-19 infection Reportedly first positive test on 04/22/2021. Again positive on 5/7, CT value 34.2 Treated with monupiravir, 4/28-5/3. Currently while the patient does have worsening hypoxia and bilateral crackles CT scan negative for any worsening infiltrate bilaterally and therefore no indication of further therapy for now. Due to immunosuppressed status from underline metastatic cancer will continue need to be in isolation Plan to come off isolation on 5/17 ( 21days from 4/27)   Persistent A. fib with RVR Heart rate controlled after cardizem  dose increase from 240 mg to 300 mg. Eliquis held for procedure, resumed on 5/ 13  Type 2 diabetes mellitus, uncontrolled with hyperglycemia Hemoglobin A1c 6.2. Continue to monitor with sliding scale insulin. Adjust ssi while on steroids   Acute kidney injury on CKD 3A Baseline serum creatinine 1.05. On presentation serum creatinine 1.37. cr today  On 5/13 is 0.79  Hypokalemia Replaced.  Monitor.   Elevated LFT, was normal in 02/2021, started to increase from 5/7 Unclear etiology could be from covid infection hepatobiliary: Interval dilated gallbladder without visible gallstones or wall thickening. No pericholecystic fluid. Unremarkable liver.   CK mildly elevated, hepatitis panel negative  DC Tylenol, hold statin Repeat cmp  Left buttock unstageable ulcer, POA Continue foam dressing.  Wound care consulted.    Hypothyroidism  continue Synthroid.  Constipation: On Senokot/MiraLAX/milk of magnesia   Obesity Placing the patient at high risk of  poor outcome in the setting of COVID-19 illness. Body mass index is 32.95 kg/m.     FTT: PT recommended SNF placement, husband and patient agrees  Body mass index is 32.95 kg/m.  Nutrition Problem: Increased nutrient needs Etiology: acute illness,catabolic illness (DTOIZ-12 infections; newly dx lung mass with spinal mets) Interventions: Interventions: Ensure Enlive (each supplement provides 350kcal and 20 grams of protein),Prostat,Magic cup  Pressure Injury 05/02/21 Hip Left Unstageable - Full thickness tissue loss in which the base of the injury is covered by slough (yellow, tan, gray, green or brown) and/or eschar (tan, brown or black) in the wound bed. black wound bed (Active)  05/02/21 2130  Location: Hip  Location Orientation: Left  Staging: Unstageable - Full thickness tissue loss in which the base of the injury is covered by slough (yellow, tan, gray, green or brown) and/or eschar (tan, brown or black) in the wound bed.  Wound Description (Comments): black wound bed  Present on Admission: Yes    Diet: Carb modified diet DVT Prophylaxis:   Place and maintain sequential compression device Start: 05/03/21 1142  Advance goals of care discussion: Full code  Family Communication: husband at bedside on 5/12 and 5/13    Disposition:  Status is: Inpatient  Remains inpatient appropriate because: Ongoing pain control need, SNF needs as well as bronchoscopy on 5/12.  Dispo: The patient is from: Home              Anticipated d/c is to: SNF              Possible discharge mid week next week, Will be off covid isolation on 5/17            Need to call rad onc on Monday regarding when to start radiation treatment                 Difficult to place patient , no  Subjective:    She is very weak, needs two assist to get up from chair Reports pain is better controlled, Still no bm  She is on room air, no fever, I did not hear cough today Husband at bedside  Physical  Exam:  General: appear very weak and pale Cardiovascular: S1 and S2 Present, no Murmur, Respiratory: good respiratory effort, Bilateral Air entry present and CTA, no Crackles, no wheezes Abdomen: Bowel Sound present, Soft and no tenderness Extremities: no Pedal edema Neurology: alert and oriented to time, place, and person affect appropriate. no new focal deficit Gait not checked due to patient safety concerns     Vitals:   05/09/21 0520 05/09/21 0814 05/09/21 0830 05/09/21 1125  BP: 117/72 122/75  119/80  Pulse: 88   89  Resp: 16   14  Temp: 97.8 F (36.6 C)   (!) 97.5 F (36.4 C)  TempSrc: Oral   Oral  SpO2: 96% 98% 91% 95%  Weight:      Height:        Intake/Output Summary (Last 24 hours) at 05/09/2021 1653 Last data filed at 05/09/2021 1150 Gross per 24 hour  Intake 120 ml  Output 1500 ml  Net -1380 ml   Filed Weights   05/02/21 1347  Weight: 84.4 kg   Data Reviewed: I have personally reviewed and interpreted daily labs, tele  strips, imaging. I reviewed all nursing notes, pharmacy notes, vitals, pertinent old records I have discussed plan of care as described above with RN and patient/family.  CBC: Recent Labs  Lab 05/03/21 0813 05/06/21 0426 05/07/21 0350 05/08/21 0417  WBC 17.3* 17.6* 14.8* 14.6*  NEUTROABS  --  15.4* 13.1* 12.8*  HGB 10.9* 9.9* 10.5* 8.8*  HCT 35.6* 32.5* 33.4* 28.1*  MCV 88.3 89.0 85.9 86.5  PLT 261 287 250 366   Basic Metabolic Panel: Recent Labs  Lab 05/02/21 2123 05/03/21 0400 05/06/21 0426 05/07/21 0350 05/08/21 0417  NA 134* 138 135 137 136  K 3.3* 3.7 3.6 4.2 3.5  CL 99 104 98 100 100  CO2 23 23 26 27 29   GLUCOSE 106* 85 168* 164* 177*  BUN 16 15 25* 25* 22  CREATININE 1.18* 1.16* 0.79 1.10* 0.79  CALCIUM 9.4 9.6 9.2 9.4 9.1  MG  --   --  1.9  --   --    Studies: No results found.  Scheduled Meds: . (feeding supplement) PROSource Plus  30 mL Oral BID BM  . apixaban  5 mg Oral BID  . vitamin C  500 mg Oral  Daily  . chlorhexidine  15 mL Mouth Rinse BID  . [START ON 05/10/2021] dexamethasone (DECADRON) injection  4 mg Intravenous Q12H  . diltiazem  300 mg Oral Daily  . feeding supplement  237 mL Oral BID BM  . insulin aspart  0-5 Units Subcutaneous QHS  . insulin aspart  0-9 Units Subcutaneous TID WC  . levothyroxine  100 mcg Oral Q0600  . mouth rinse  15 mL Mouth Rinse q12n4p  . nutrition supplement (JUVEN)  1 packet Oral BID BM  . polyethylene glycol  17 g Oral BID  . senna-docusate  1 tablet Oral BID  . zinc sulfate  220 mg Oral Daily   Continuous Infusions: . [START ON 05/10/2021] ampicillin-sulbactam (UNASYN) IV     PRN Meds: morphine injection, oxyCODONE, phenol  Time spent: 25 minutes  Author: Florencia Reasons, MD PhD FACP [facility] 05/09/2021 4:53 PM  To reach On-call, see care teams to locate the attending and reach out via [email or web address]. Between 7PM-7AM, please contact night-coverage If you still have difficulty reaching the attending provider, please page the [facility] (Director on Call) for [facility] on amion for assistance.

## 2021-05-10 ENCOUNTER — Encounter (HOSPITAL_COMMUNITY): Payer: Self-pay | Admitting: Emergency Medicine

## 2021-05-10 DIAGNOSIS — C3490 Malignant neoplasm of unspecified part of unspecified bronchus or lung: Secondary | ICD-10-CM | POA: Diagnosis not present

## 2021-05-10 DIAGNOSIS — C7951 Secondary malignant neoplasm of bone: Secondary | ICD-10-CM | POA: Diagnosis not present

## 2021-05-10 LAB — HEPATIC FUNCTION PANEL
ALT: 261 U/L — ABNORMAL HIGH (ref 0–44)
AST: 170 U/L — ABNORMAL HIGH (ref 15–41)
Albumin: 2.4 g/dL — ABNORMAL LOW (ref 3.5–5.0)
Alkaline Phosphatase: 133 U/L — ABNORMAL HIGH (ref 38–126)
Bilirubin, Direct: 0.1 mg/dL (ref 0.0–0.2)
Indirect Bilirubin: 0.3 mg/dL (ref 0.3–0.9)
Total Bilirubin: 0.4 mg/dL (ref 0.3–1.2)
Total Protein: 6.7 g/dL (ref 6.5–8.1)

## 2021-05-10 LAB — GLUCOSE, CAPILLARY
Glucose-Capillary: 189 mg/dL — ABNORMAL HIGH (ref 70–99)
Glucose-Capillary: 189 mg/dL — ABNORMAL HIGH (ref 70–99)
Glucose-Capillary: 189 mg/dL — ABNORMAL HIGH (ref 70–99)
Glucose-Capillary: 247 mg/dL — ABNORMAL HIGH (ref 70–99)
Glucose-Capillary: 247 mg/dL — ABNORMAL HIGH (ref 70–99)

## 2021-05-10 LAB — CULTURE, RESPIRATORY W GRAM STAIN: Culture: NORMAL

## 2021-05-10 LAB — D-DIMER, QUANTITATIVE: D-Dimer, Quant: 0.77 ug/mL-FEU — ABNORMAL HIGH (ref 0.00–0.50)

## 2021-05-10 LAB — C-REACTIVE PROTEIN: CRP: 1.3 mg/dL — ABNORMAL HIGH (ref <1.0)

## 2021-05-10 LAB — BRAIN NATRIURETIC PEPTIDE: B Natriuretic Peptide: 255.8 pg/mL — ABNORMAL HIGH (ref 0.0–100.0)

## 2021-05-10 MED ORDER — BISACODYL 10 MG RE SUPP
10.0000 mg | Freq: Every day | RECTAL | Status: AC
  Administered 2021-05-10: 10 mg via RECTAL
  Filled 2021-05-10: qty 1

## 2021-05-10 NOTE — Progress Notes (Signed)
Nutrition Follow-up  RD working remotely.  DOCUMENTATION CODES:   Not applicable  INTERVENTION:  - continue Ensure Enlive/Plus BID, 30 ml Prosource Plus BID, and 1 packet Juven BID. - weigh patient today.    NUTRITION DIAGNOSIS:   Increased nutrient needs related to acute illness,catabolic illness (LFYBO-17 infection) as evidenced by estimated needs. -ongoing  GOAL:   Patient will meet greater than or equal to 90% of their needs -variably met  MONITOR:   PO intake,Supplement acceptance,Labs,Weight trends  ASSESSMENT:   70 y.o. female with medical history of afib on Eliquis, bladder cancer, HTN, CAD, OSA, hypothyroidism, stage 3 CKD, type 2 DM, and recent COVID infection (04/22/21) who presents with worsening back pain. She has been seen in the ED several times in the past 2 months for worsening back pain and has been treated with steroids and opioids for pain management.  Very limited meal completion documentation since admission: 50% of breakfast and 25% of lunch on 5/9; 75% of dinner on 5/11; 100% of breakfast on 5/13.  She has been accepting Ensure and Juven 75% of the time offered and Prosource ~100% of the time offered.   She has not been weighed since admission date of 5/7. Non-pitting edema to RUE and RLE documented in the edema section of flow sheet.     Labs reviewed; CBGs: 189 and 247 mg/dl, LFTs elevated.  Medications reviewed; 500 mg ascorbic acid/day, sliding scale novolog, 100 mcg oral synthroid/day, 17 g miralax BID, 1 tablet senokot BID, 220 mg zinc sulfate/day.    Diet Order:   Diet Order            Diet heart healthy/carb modified Room service appropriate? Yes; Fluid consistency: Thin  Diet effective now                 EDUCATION NEEDS:   No education needs have been identified at this time  Skin:  Skin Assessment: Skin Integrity Issues: Skin Integrity Issues:: Unstageable Unstageable: full thickness to L buttocks  Last BM:   PTA/unknown  Height:   Ht Readings from Last 1 Encounters:  05/02/21 '5\' 3"'  (1.6 m)    Weight:   Wt Readings from Last 1 Encounters:  05/02/21 84.4 kg    Ideal Body Weight:  52.27 kg  BMI:  Body mass index is 32.95 kg/m.  Estimated Nutritional Needs:   Kcal:  2000-2200 kcal  Protein:  100-115 grams  Fluid:  >/= 2.2 L/day     Jarome Matin, MS, RD, LDN, CNSC Inpatient Clinical Dietitian RD pager # available in AMION  After hours/weekend pager # available in [facility]

## 2021-05-10 NOTE — Progress Notes (Addendum)
[facility] Progress Note  Patient: [name]    IWL:[id number]  DOA: 05/02/2021     Date of Service: the patient was seen and examined on 05/10/2021  Brief hospital course: Past medical history of chronic A. fib on Eliquis, bladder cancer, HTN, CAD, OSA, hypothyroidism, CKD 3A, type II DM, COVID infection.  Presents with complaints over biopsing back pain. Appears to have postobstructive pneumonia as well as compression fracture on T8.  Neurosurgery recommends steroids and pain control. Also has a lung mass .  S/ p Bronchoscopy/biopsy on 5/12. Currently plan is pain control and treatment for postobstructive pneumonia.  Plan to contact rad onc on Monday 5/16 regarding when to start radiation treatment Will be off covid isolation on 5/17 Discharge to snf if no plan to inpatient XRT  Assessment and Plan:  Postobstructive pneumonia Acute hypoxic respiratory failure, POA 74% on room air on admission Does not use any oxygen at her baseline. Blood cultures currently negative, sputum culture normal flora  suspected to have postobstructive pneumonia, on unasyn, last dose on 5/16 am    Incentive spirometry and flutter device also recommended.    Lung mass, right upper lobe with metastasis CT scan shows right upper lobe lung mass with metastatic mediastinal and right hilar adenopathy as well as bony metastasis. Recently treated for L4 compression fracture and appears to have T8 compression fracture. CT scan negative for PE. The mass appears to have increased in size over few weeks. S/p  bronchoscopy and biopsy on 5/12, pathology pending ,  resumed  eliquis on 5/13 after discussion with pulmonology  Appreciate pulmonary consultation.   need to secure oncology follow up prior to discharge    T8 compression fracture Seen on CT scan,  presented with worsening back  Pain, Likely pathological fracture. Seen by  neurosurgery Dr Annette Stable on 5/9.  Details please refer to original notes,  continue  Steroids as recommended   Dr Posey Pronto Discussed with radiation oncology, Dr. Sondra Come initially.  Given that the patient is still in isolation patient will set up for simulation and further therapy based on the biopsy results outpatient. She reports pain is better, but she has significant pain with minimal movement Will need to discuss with rad onc again on Monday per pccm request,  patient will come off isolation on 5/17  COVID-19 infection Reportedly first positive test on 04/22/2021. Again positive on 5/7, CT value 34.2 Treated with monupiravir, 4/28-5/3. Currently while the patient does have worsening hypoxia and bilateral crackles CT scan negative for any worsening infiltrate bilaterally and therefore no indication of further therapy for now. Due to immunosuppressed status from underline metastatic cancer will continue need to be in isolation Plan to come off isolation on 5/17 ( 21days from 4/27)   Persistent A. fib with RVR Heart rate controlled after cardizem  dose increase from 240 mg to 300 mg. Eliquis held for procedure, resumed on 5/ 13  Type 2 diabetes mellitus, uncontrolled with hyperglycemia Hemoglobin A1c 6.2. Continue to monitor with sliding scale insulin. Adjust ssi while on steroids   Acute kidney injury on CKD 3A Baseline serum creatinine 1.05. On presentation serum creatinine 1.37. cr today  On 5/13 is 0.79  Hypokalemia Replaced.  Monitor.   Elevated LFT, was normal in 02/2021, started to increase from 5/7 Unclear etiology, could be from covid infection hepatobiliary: Interval dilated gallbladder without visible gallstones or wall thickening. No pericholecystic fluid. Unremarkable liver.   CK mildly elevated but trending down hepatitis panel negative  DC Tylenol, hold statin Repeat cmp   Left hip unstageable ulcer, POA Continue foam dressing.  Wound care consulted.   Hypothyroidism  continue Synthroid.  Constipation: On Senokot/MiraLAX/milk  of magnesia, suppository , may need enema if still no bm, patient agrees   Obesity Placing the patient at high risk of poor outcome in the setting of COVID-19 illness. Body mass index is 32.95 kg/m.     FTT: PT recommended SNF placement, husband and patient agrees  Body mass index is 32.95 kg/m.  Nutrition Problem: Increased nutrient needs Etiology: acute illness,catabolic illness ([id number] infection) Interventions: Interventions: Ensure Enlive (each supplement provides 350kcal and 20 grams of protein),Prostat,MVI  Pressure Injury 05/02/21 Hip Left Unstageable - Full thickness tissue loss in which the base of the injury is covered by slough (yellow, tan, gray, green or brown) and/or eschar (tan, brown or black) in the wound bed. black wound bed (Active)  05/02/21 2130  Location: Hip  Location Orientation: Left  Staging: Unstageable - Full thickness tissue loss in which the base of the injury is covered by slough (yellow, tan, gray, green or brown) and/or eschar (tan, brown or black) in the wound bed.  Wound Description (Comments): black wound bed  Present on Admission: Yes    Diet: Carb modified diet DVT Prophylaxis:   Place and maintain sequential compression device Start: 05/03/21 1142  Advance goals of care discussion: Full code  Family Communication: husband at bedside on 5/12 and 5/13, 5/15    Disposition:  Status is: Inpatient  Remains inpatient appropriate because: Ongoing pain control need, SNF needs as well as bronchoscopy on 5/12.  Dispo: The patient is from: Home              Anticipated d/c is to: SNF               discharge mid week next week, Will be off covid isolation on 5/17            Plan to contact  rad onc on Monday regarding when to start radiation treatment                 Difficult to place patient , no  Subjective:    She is very weak, needs two assist to get up from chair Reports pain is better controlled, but I observed her in  significant pain with minimal activity Still no bm  She is on room air, no fever, I did not hear cough today Husband at bedside  Physical Exam:  General: appear very weak and pale Cardiovascular: S1 and S2 Present, no Murmur, Respiratory: good respiratory effort, Bilateral Air entry present and CTA, no Crackles, no wheezes Abdomen: Bowel Sound present, Soft and no tenderness Extremities: no Pedal edema, legs are barely able to  lift against gravity  Neurology: alert and oriented to time, place, and person affect appropriate. no new focal deficit     Vitals:   05/10/21 0515 05/10/21 0809 05/10/21 1554 05/10/21 1759  BP: 125/72 115/66  120/70  Pulse: 98 100  91  Resp: 18 18  17   Temp: 97.8 F (36.6 C) (!) 97.5 F (36.4 C)  (!) 97.4 F (36.3 C)  TempSrc: Oral Oral  Oral  SpO2: 91% 97% 95% 93%  Weight:      Height:        Intake/Output Summary (Last 24 hours) at 05/10/2021 1805 Last data filed at 05/10/2021 1403 Gross per 24 hour  Intake --  Output 1125 ml  Net -  1125 ml   Filed Weights   05/02/21 1347  Weight: 84.4 kg   Data Reviewed: I have personally reviewed and interpreted daily labs, tele strips, imaging. I reviewed all nursing notes, pharmacy notes, vitals, pertinent old records I have discussed plan of care as described above with RN and patient/family.  CBC: Recent Labs  Lab 05/06/21 0426 05/07/21 0350 05/08/21 0417  WBC 17.6* 14.8* 14.6*  NEUTROABS 15.4* 13.1* 12.8*  HGB 9.9* 10.5* 8.8*  HCT 32.5* 33.4* 28.1*  MCV 89.0 85.9 86.5  PLT 287 250 350   Basic Metabolic Panel: Recent Labs  Lab 05/06/21 0426 05/07/21 0350 05/08/21 0417  NA 135 137 136  K 3.6 4.2 3.5  CL 98 100 100  CO2 26 27 29   GLUCOSE 168* 164* 177*  BUN 25* 25* 22  CREATININE 0.79 1.10* 0.79  CALCIUM 9.2 9.4 9.1  MG 1.9  --   --    Studies: No results found.  Scheduled Meds: . (feeding supplement) PROSource Plus  30 mL Oral BID BM  . apixaban  5 mg Oral BID  . vitamin C   500 mg Oral Daily  . bisacodyl  10 mg Rectal Daily  . chlorhexidine  15 mL Mouth Rinse BID  . dexamethasone (DECADRON) injection  4 mg Intravenous Q12H  . diltiazem  300 mg Oral Daily  . feeding supplement  237 mL Oral BID BM  . insulin aspart  0-5 Units Subcutaneous QHS  . insulin aspart  0-9 Units Subcutaneous TID WC  . levothyroxine  100 mcg Oral Q0600  . mouth rinse  15 mL Mouth Rinse q12n4p  . nutrition supplement (JUVEN)  1 packet Oral BID BM  . polyethylene glycol  17 g Oral BID  . senna-docusate  1 tablet Oral BID  . zinc sulfate  220 mg Oral Daily   Continuous Infusions: . ampicillin-sulbactam (UNASYN) IV 3 g (05/10/21 1313)   PRN Meds: morphine injection, oxyCODONE, phenol  Time spent: 25 minutes  Author: Florencia Reasons, MD PhD FACP [facility] 05/10/2021 6:05 PM  To reach On-call, see care teams to locate the attending and reach out via [email or web address]. Between 7PM-7AM, please contact night-coverage If you still have difficulty reaching the attending provider, please page the [facility] (Director on Call) for [facility] on amion for assistance.

## 2021-05-11 ENCOUNTER — Inpatient Hospital Stay (HOSPITAL_COMMUNITY): Payer: Medicare Other

## 2021-05-11 ENCOUNTER — Other Ambulatory Visit: Payer: Self-pay | Admitting: Radiation Oncology

## 2021-05-11 ENCOUNTER — Ambulatory Visit
Admit: 2021-05-11 | Discharge: 2021-05-11 | Disposition: A | Discharge status: Home / Self Care | Payer: Medicare Other | Attending: Radiation Oncology | Admitting: Radiation Oncology

## 2021-05-11 DIAGNOSIS — C7951 Secondary malignant neoplasm of bone: Secondary | ICD-10-CM | POA: Diagnosis not present

## 2021-05-11 DIAGNOSIS — C3490 Malignant neoplasm of unspecified part of unspecified bronchus or lung: Secondary | ICD-10-CM

## 2021-05-11 LAB — COMPREHENSIVE METABOLIC PANEL
ALT: 227 U/L — ABNORMAL HIGH (ref 0–44)
AST: 125 U/L — ABNORMAL HIGH (ref 15–41)
Albumin: 2.3 g/dL — ABNORMAL LOW (ref 3.5–5.0)
Alkaline Phosphatase: 121 U/L (ref 38–126)
Anion gap: 8 (ref 5–15)
BUN: 29 mg/dL — ABNORMAL HIGH (ref 8–23)
CO2: 27 mmol/L (ref 22–32)
Calcium: 8.6 mg/dL — ABNORMAL LOW (ref 8.9–10.3)
Chloride: 100 mmol/L (ref 98–111)
Creatinine, Ser: 0.69 mg/dL (ref 0.44–1.00)
GFR, Estimated: >60 mL/min (ref >60)
Glucose, Bld: 226 mg/dL — ABNORMAL HIGH (ref 70–99)
Potassium: 4.5 mmol/L (ref 3.5–5.1)
Sodium: 135 mmol/L (ref 135–145)
Total Bilirubin: 0.4 mg/dL (ref 0.3–1.2)
Total Protein: 6.4 g/dL — ABNORMAL LOW (ref 6.5–8.1)

## 2021-05-11 LAB — CBC WITH DIFFERENTIAL/PLATELET
Abs Immature Granulocytes: 0.34 10*3/uL — ABNORMAL HIGH (ref 0.00–0.07)
Basophils Absolute: 0 10*3/uL (ref 0.0–0.1)
Basophils Relative: 0 %
Eosinophils Absolute: 0 10*3/uL (ref 0.0–0.5)
Eosinophils Relative: 0 %
HCT: 31.7 % — ABNORMAL LOW (ref 36.0–46.0)
Hemoglobin: 10.1 g/dL — ABNORMAL LOW (ref 12.0–15.0)
Immature Granulocytes: 2 %
Lymphocytes Relative: 5 %
Lymphs Abs: 0.9 10*3/uL (ref 0.7–4.0)
MCH: 27.4 pg (ref 26.0–34.0)
MCHC: 31.9 g/dL (ref 30.0–36.0)
MCV: 86.1 fL (ref 80.0–100.0)
Monocytes Absolute: 1 10*3/uL (ref 0.1–1.0)
Monocytes Relative: 5 %
Neutro Abs: 16.2 10*3/uL — ABNORMAL HIGH (ref 1.7–7.7)
Neutrophils Relative %: 88 %
Platelets: 317 10*3/uL (ref 150–400)
RBC: 3.68 MIL/uL — ABNORMAL LOW (ref 3.87–5.11)
RDW: 16.5 % — ABNORMAL HIGH (ref 11.5–15.5)
WBC: 18.4 10*3/uL — ABNORMAL HIGH (ref 4.0–10.5)
nRBC: 0.1 % (ref 0.0–0.2)

## 2021-05-11 LAB — GLUCOSE, CAPILLARY
Glucose-Capillary: 214 mg/dL — ABNORMAL HIGH (ref 70–99)
Glucose-Capillary: 216 mg/dL — ABNORMAL HIGH (ref 70–99)
Glucose-Capillary: 238 mg/dL — ABNORMAL HIGH (ref 70–99)
Glucose-Capillary: 272 mg/dL — ABNORMAL HIGH (ref 70–99)
Glucose-Capillary: 299 mg/dL — ABNORMAL HIGH (ref 70–99)

## 2021-05-11 MED ORDER — FUROSEMIDE 10 MG/ML IJ SOLN
40.0000 mg | Freq: Two times a day (BID) | INTRAMUSCULAR | Status: AC
  Administered 2021-05-11 – 2021-05-12 (×2): 40 mg via INTRAVENOUS
  Filled 2021-05-11 (×2): qty 4

## 2021-05-11 MED ORDER — LORAZEPAM 0.5 MG PO TABS
0.5000 mg | ORAL_TABLET | Freq: Once | ORAL | Status: AC
  Administered 2021-05-13: 0.5 mg via ORAL
  Filled 2021-05-11: qty 1

## 2021-05-11 NOTE — Consult Note (Signed)
Radiation Oncology         [phone] ________________________________  Name: [name]        MRN: [id number]  Date of Service: 05/11/21 DOB: 1951-09-24  SA:[id number], Garen Lah, MD    REFERRING PHYSICIAN: Dr. Erlinda Hong  DIAGNOSIS: The primary encounter diagnosis was Metastatic malignant neoplasm, unspecified site [facility]). Diagnoses of Compression fracture of T8 vertebra, initial encounter [facility]), Atrial fibrillation with [facility]), Surgery, elective, Lung cancer metastatic to bone [facility]), and CHF (congestive heart failure) ([place]) were also pertinent to this visit.   HISTORY OF PRESENT ILLNESS: [name] is a 70 y.o. female seen at the request of the inpatient hospitalist team for a new diagnosis of Stage IV lung cancer with concerns for impending cord compression.  The patient has been to the emergency department several times in the last few months with low back pain. She had an outpatient MRI on 03/14/2021 that showed Schmorl's nodes but no evidence of compression fracture at L4-L5, she subsequently underwent kyphoplasty with Dr. Trenton Gammon.  CT imaging in March 2022 showed a masslike consolidation in the right upper lobe that could possibly be pneumonia and it was recommended that she have repeat follow-up of this with additional imaging.  In between this time however she developed progressive pain, and presented to the emergency department she had had a diagnosis on 04/22/2021 of COVID.  CT imaging in the emergency department on 05/02/2021 showed a right upper lobe mass with mediastinal and right hilar adenopathy concerning for lung cancer as well as possible left hilar adenopathy.  There was multiple bony metastases with interval collapse of T8 with paraspinal extension of tumor and multiple small right lung nodules.  No other disease was seen in the abdomen or pelvis.  An MRI of the total spine on 05/02/2021 showed wedge compression fracture of T8 with 25% height loss and abnormal signal extending to the  posterior elements of T8 suggesting a fracture, at least moderate spinal canal stenosis was seen at T8 with mild deformity of the spinal cord.  There was also concern for metastatic disease at L2, L4, and S1.  Degenerative changes were also seen with mild spinal canal stenosis in the cervical spine.  She has undergone bronchoscopy with EBUS on 05/07/2021.  Final pathology reveals squamous cell carcinoma.  She will have COVID restrictions lifted tomorrow, and is contacted to discuss proceeding with radiotherapy for her impending cord compression.     PREVIOUS RADIATION THERAPY: No   PAST MEDICAL HISTORY:  Past Medical History:  Diagnosis Date  . Arthritis    wrist, hands -  . Atrial fibrillation, persistent ([place])   . Bladder tumor   . CAD (coronary artery disease) cardiologist-  dr hochrien   PTCA RCA 1999  . Cataract   . Diabetes ([place])   . Full dentures   . GERD (gastroesophageal reflux disease)   . History of colon polyps    2005  hyperplastic  . History of MI (myocardial infarction)    1999  . HLD (hyperlipidemia)   . Hypertension   . Hypothyroidism   . Lung cancer metastatic to bone ([place]) 05/02/2021  . Sleep apnea   . SUI (stress urinary incontinence, female)        PAST SURGICAL HISTORY: Past Surgical History:  Procedure Laterality Date  . BRONCHIAL BIOPSY  05/07/2021   Procedure: BRONCHIAL BIOPSIES;  Surgeon: Collene Gobble, MD;  Location: [facility] ENDOSCOPY;  Service: Pulmonary;;  . BRONCHIAL BRUSHINGS  05/07/2021  Procedure: BRONCHIAL BRUSHINGS;  Surgeon: Collene Gobble, MD;  Location: [facility] ENDOSCOPY;  Service: Pulmonary;;  . BRONCHIAL NEEDLE ASPIRATION BIOPSY  05/07/2021   Procedure: BRONCHIAL NEEDLE ASPIRATION BIOPSIES;  Surgeon: Collene Gobble, MD;  Location: [place] [facility] ENDOSCOPY;  Service: Pulmonary;;  . CARDIOVASCULAR STRESS TEST  05-01-2009   dr hochrein   normal nuclear study/  no ischemia or scar/  normal LV function and wall motion, ef 71%  . COLONOSCOPY     multiple  .  CORONARY ANGIOPLASTY  Feb 1999   PTCA to RCA  . D & C HYSTEROSCOPY/  POLYPECTOMY  05-08-2009  . ENDOBRONCHIAL ULTRASOUND N/A 05/07/2021   Procedure: ENDOBRONCHIAL ULTRASOUND;  Surgeon: Collene Gobble, MD;  Location: [facility] ENDOSCOPY;  Service: Pulmonary;  Laterality: N/A;  . HEMOSTASIS CONTROL  05/07/2021   Procedure: HEMOSTASIS CONTROL;  Surgeon: Collene Gobble, MD;  Location: [facility] ENDOSCOPY;  Service: Pulmonary;;  . HYSTEROSCOPY WITH D & C  12/07/2012   Procedure: DILATATION AND CURETTAGE [place];  Surgeon: Elveria Royals, MD;  Location: [facility];  Service: Gynecology;  Laterality: N/A;  Dilitation and curettage /hysteroscopy/biopsey of lower segment of uterus  . ORIF ANKLE FRACTURE Left 11/10/2020   Procedure: OPEN REDUCTION INTERNAL FIXATION (ORIF) ANKLE FRACTURE;  Surgeon: Shona Needles, MD;  Location: [place];  Service: Orthopedics;  Laterality: Left;  . TOTAL HIP ARTHROPLASTY Left 12-11-2010  . TRANSTHORACIC ECHOCARDIOGRAM  09-11-2015   mild LVH, ef 55%,  mildly reduced RVSF,  trivial TR  . TRANSURETHRAL RESECTION OF BLADDER TUMOR N/A 12/01/2015   Procedure: TRANSURETHRAL RESECTION OF BLADDER TUMOR (TURBT);  Surgeon: Kathie Rhodes, MD;  Location: [facility];  Service: Urology;  Laterality: N/A;  . TUBAL LIGATION  1974  . VIDEO BRONCHOSCOPY  05/07/2021   Procedure: VIDEO BRONCHOSCOPY;  Surgeon: Collene Gobble, MD;  Location: [facility] ENDOSCOPY;  Service: Pulmonary;;     FAMILY HISTORY:  Family History  Problem Relation Age of Onset  . Diabetes Mother   . Heart disease Mother   . Colon cancer Mother 92  . Heart disease Father   . Cancer Sister        Lung  . Cancer Brother        intestinal  . Atrial fibrillation Brother   . Cancer Brother        throat ca  . Cancer Brother        bladder  . Breast cancer Neg Hx      SOCIAL HISTORY:  reports that she quit smoking about 39 years ago. Her smoking use included cigarettes. She has a 22.50 pack-year smoking history.  She has never used smokeless tobacco. She reports that she does not drink alcohol and does not use drugs.   ALLERGIES: Percocet [oxycodone-acetaminophen]   MEDICATIONS:  Current Facility-Administered Medications  Medication Dose Route Frequency Provider Last Rate Last Admin  . (feeding supplement) PROSource Plus liquid 30 mL  30 mL Oral BID BM Collene Gobble, MD   30 mL at 05/10/21 2218  . apixaban (ELIQUIS) tablet 5 mg  5 mg Oral BID Florencia Reasons, MD   5 mg at 05/10/21 2219  . ascorbic acid (VITAMIN C) tablet 500 mg  500 mg Oral Daily Collene Gobble, MD   500 mg at 05/10/21 0914  . bisacodyl (DULCOLAX) suppository 10 mg  10 mg Rectal Daily Florencia Reasons, MD   10 mg at 05/10/21 1529  . chlorhexidine (PERIDEX) 0.12 % solution 15 mL  15 mL Mouth  Rinse BID Florencia Reasons, MD   15 mL at 05/10/21 2220  . dexamethasone (DECADRON) injection 4 mg  4 mg Intravenous Q12H Florencia Reasons, MD   4 mg at 05/11/21 0816  . diltiazem (CARDIZEM CD) 24 hr capsule 300 mg  300 mg Oral Daily Collene Gobble, MD   300 mg at 05/10/21 0914  . feeding supplement (ENSURE ENLIVE / ENSURE PLUS) liquid 237 mL  237 mL Oral BID BM Collene Gobble, MD   237 mL at 05/10/21 1531  . insulin aspart (novoLOG) injection 0-5 Units  0-5 Units Subcutaneous QHS Collene Gobble, MD   2 Units at 05/09/21 2220  . insulin aspart (novoLOG) injection 0-9 Units  0-9 Units Subcutaneous TID WC Collene Gobble, MD   3 Units at 05/11/21 [phone]  . levothyroxine (SYNTHROID) tablet 100 mcg  100 mcg Oral Q0600 Collene Gobble, MD   100 mcg at 05/11/21 0530  . MEDLINE mouth rinse  15 mL Mouth Rinse q12n4p Florencia Reasons, MD   15 mL at 05/10/21 1531  . morphine 2 MG/ML injection 2 mg  2 mg Intravenous Q3H PRN Collene Gobble, MD   2 mg at 05/11/21 0818  . nutrition supplement (JUVEN) (JUVEN) powder packet 1 packet  1 packet Oral BID BM Collene Gobble, MD   1 packet at 05/11/21 [phone]  . oxyCODONE (Oxy IR/ROXICODONE) immediate release tablet 5 mg  5 mg Oral Q4H PRN Collene Gobble,  MD   5 mg at 05/11/21 0300  . phenol (CHLORASEPTIC) mouth spray 1 spray  1 spray Mouth/Throat PRN Florencia Reasons, MD   1 spray at 05/07/21 1755  . polyethylene glycol (MIRALAX / GLYCOLAX) packet 17 g  17 g Oral BID Collene Gobble, MD   17 g at 05/11/21 3299  . senna-docusate (Senokot-S) tablet 1 tablet  1 tablet Oral BID Collene Gobble, MD   1 tablet at 05/10/21 2218  . zinc sulfate capsule 220 mg  220 mg Oral Daily Collene Gobble, MD   220 mg at 05/10/21 2426     REVIEW OF SYSTEMS: On review of systems, the patient reports that she is doing okay but is having pain in the low back. It does not seem to lateralize, and does not shoot, burn, or cause symptoms down her leg or legs. She denies any burning, tingling or loss of sensation of her upper trunk. She does have palpitations noted per nursing and shortness of breath due to her pneumonia. No other complaints are noted.     PHYSICAL EXAM:  Wt Readings from Last 3 Encounters:  05/02/21 186 lb (84.4 kg)  03/19/21 183 lb (83 kg)  03/09/21 186 lb (84.4 kg)   Temp Readings from Last 3 Encounters:  05/11/21 97.6 F (36.4 C) (Oral)  03/18/21 98.3 F (36.8 C) (Oral)  03/15/21 98.1 F (36.7 C) (Oral)   BP Readings from Last 3 Encounters:  05/11/21 (!) 136/91  03/19/21 (!) 94/52  03/18/21 113/73   Pulse Readings from Last 3 Encounters:  05/11/21 96  03/19/21 83  03/18/21 (!) 110   Pain Assessment Pain Score: Asleep/10  Unable to assess due to encounter type.  ECOG = 3  0 - Asymptomatic (Fully active, able to carry on all predisease activities without restriction)  1 - Symptomatic but completely ambulatory (Restricted in physically strenuous activity but ambulatory and able to carry out work of a light or sedentary nature. For example, light housework, office work)  2 - Symptomatic, <50% in bed during the day (Ambulatory and capable of all self care but unable to carry out any work activities. Up and about more than 50% of waking  hours)  3 - Symptomatic, >50% in bed, but not bedbound (Capable of only limited self-care, confined to bed or chair 50% or more of waking hours)  4 - Bedbound (Completely disabled. Cannot carry on any self-care. Totally confined to bed or chair)  5 - Death   Eustace Pen MM, Creech RH, Tormey DC, et al. [phone]). "Toxicity and response criteria of the [facility]". [place] Oncol. 5 (6): 649-55    LABORATORY DATA:  Lab Results  Component Value Date   WBC 18.4 (H) 05/11/2021   HGB 10.1 (L) 05/11/2021   HCT 31.7 (L) 05/11/2021   MCV 86.1 05/11/2021   PLT 317 05/11/2021   Lab Results  Component Value Date   NA 135 05/11/2021   K 4.5 05/11/2021   CL 100 05/11/2021   CO2 27 05/11/2021   Lab Results  Component Value Date   ALT 227 (H) 05/11/2021   AST 125 (H) 05/11/2021   ALKPHOS 121 05/11/2021   BILITOT 0.4 05/11/2021      RADIOGRAPHY: CT Angio Chest PE W/Cm &/Or Wo Cm  Result Date: 05/02/2021 CLINICAL DATA:  Abdominal pain and acute, non localized lower to mid back pain. Back surgery 3 weeks ago. Recently diagnosed COVID-19. EXAM: CT ANGIOGRAPHY CHEST CT ABDOMEN AND PELVIS WITH CONTRAST TECHNIQUE: Multidetector CT imaging of the chest was performed using the standard protocol during bolus administration of intravenous contrast. Multiplanar CT image reconstructions and MIPs were obtained to evaluate the vascular anatomy. Multidetector CT imaging of the abdomen and pelvis was performed using the standard protocol during bolus administration of intravenous contrast. CONTRAST:  45mL OMNIPAQUE IOHEXOL 350 MG/ML SOLN COMPARISON:  Chest, abdomen and pelvis CT dated 03/15/2021 FINDINGS: CTA CHEST FINDINGS Cardiovascular: Mildly enlarged heart. Normally opacified pulmonary arteries with no pulmonary arterial filling defects seen. Atheromatous calcifications, including the coronary arteries and aorta. Mediastinum/Nodes: Multiple significantly enlarged, confluent, low density  the right hilar lymph nodes with vascular encasement with mild progression. This includes a lateral right hilar nodal mass with a short axis diameter of 3.3 cm on image number 41/4. Enlarged left hilar node without significant change, measuring 1.1 cm in short axis diameter on image number 41/4 Enlarged precarinal node with a short axis diameter of 1.3 cm on image number 35/4. Unremarkable esophagus.  Small, poorly visualized thyroid gland. Lungs/Pleura: Irregular right upper lobe mass measuring 5.1 x 3.2 cm on image number 39/11. This previously measured 4.4 x 2.3 cm in corresponding dimensions. 3 mm nodule more inferiorly in the outer right upper lobe on image number 47/11, without significant change. Four additional smaller nodules in the superior aspect of the right middle lobe, without significant change. Mild bilateral centrilobular bullous changes without significant change. Mild peripheral eccentric or a shin of the interstitial markings in both lungs without significant change. No pleural fluid. Musculoskeletal: Lytic and sclerotic changes involving the T8 vertebral body with interval 50% collapse of the vertebral body and soft tissue extending into the anterior aspect of the spinal canal, causing moderate canal stenosis. There is also mild diffuse paraspinal extension extending superiorly and inferiorly. This is also involving the pedicles and posterior elements bilaterally with a pars fracture on the left. The fracture is nondisplaced. Lytic lesion in the posterior, superior aspect of the T9 vertebral body. Additional lytic lesion  involving the inferior aspects of the T9 inferior facets without canal extension. There is a suggestion of poorly defined, lytic areas in additional thoracic vertebral bodies. Stable old, mild T11 and T12 superior endplate compression deformities. Thoracic and lower cervical spine degenerative changes. There is also an oval lytic lesion in the posterior aspect of the left 3rd  rib. Review of the MIP images confirms the above findings. CT ABDOMEN and PELVIS FINDINGS Hepatobiliary: Interval dilated gallbladder without visible gallstones or wall thickening. No pericholecystic fluid. Unremarkable liver. Pancreas: Moderate diffuse pancreatic atrophy. Spleen: Normal in size without focal abnormality. Adrenals/Urinary Tract: Adrenal glands are unremarkable. Kidneys are normal, without renal calculi, focal lesion, or hydronephrosis. Bladder is unremarkable. Stomach/Bowel: Stomach is within normal limits. Appendix appears normal. No evidence of bowel wall thickening, distention, or inflammatory changes. Vascular/Lymphatic: Atheromatous arterial calcifications without aneurysm. No enlarged lymph nodes. Reproductive: Uterus and bilateral adnexa are unremarkable. Other: Small umbilical hernia containing fat. Musculoskeletal: Left hip prosthesis with associated streak artifacts. Posterior left iliac bone lytic lesion. Anterior, inferior L2 vertebral body lytic lesion with mild paraspinal extension. Anterior, superior S1 vertebral lytic lesion. L4 kyphoplasty and small lytic areas. Review of the MIP images confirms the above findings. IMPRESSION: 1. Right upper lobe lung cancer with metastatic mediastinal and right hilar adenopathy and possible metastatic left hilar adenopathy. 2. Multiple bony metastases with interval collapse of the T8 vertebral body with paraspinal extension of tumor, including into the anterior aspect of the spinal canal, causing moderate canal stenosis. This could progressed to cord compression. 3. Multiple small right lung nodules, suspicious for possible metastases. 4. No pulmonary emboli. 5. No acute abnormality in the abdomen or pelvis other than interval gallbladder dilatation. 6.  Calcific coronary artery and aortic atherosclerosis. Critical Value/emergent results were called by telephone at the time of interpretation on 05/02/2021 at 6:02 pm to provider [facility] , who  verbally acknowledged these results. Electronically Signed   By: Claudie Revering M.D.   On: 05/02/2021 18:02   CT Abdomen Pelvis W Contrast  Result Date: 05/02/2021 CLINICAL DATA:  Abdominal pain and acute, non localized lower to mid back pain. Back surgery 3 weeks ago. Recently diagnosed COVID-19. EXAM: CT ANGIOGRAPHY CHEST CT ABDOMEN AND PELVIS WITH CONTRAST TECHNIQUE: Multidetector CT imaging of the chest was performed using the standard protocol during bolus administration of intravenous contrast. Multiplanar CT image reconstructions and MIPs were obtained to evaluate the vascular anatomy. Multidetector CT imaging of the abdomen and pelvis was performed using the standard protocol during bolus administration of intravenous contrast. CONTRAST:  54mL OMNIPAQUE IOHEXOL 350 MG/ML SOLN COMPARISON:  Chest, abdomen and pelvis CT dated 03/15/2021 FINDINGS: CTA CHEST FINDINGS Cardiovascular: Mildly enlarged heart. Normally opacified pulmonary arteries with no pulmonary arterial filling defects seen. Atheromatous calcifications, including the coronary arteries and aorta. Mediastinum/Nodes: Multiple significantly enlarged, confluent, low density the right hilar lymph nodes with vascular encasement with mild progression. This includes a lateral right hilar nodal mass with a short axis diameter of 3.3 cm on image number 41/4. Enlarged left hilar node without significant change, measuring 1.1 cm in short axis diameter on image number 41/4 Enlarged precarinal node with a short axis diameter of 1.3 cm on image number 35/4. Unremarkable esophagus.  Small, poorly visualized thyroid gland. Lungs/Pleura: Irregular right upper lobe mass measuring 5.1 x 3.2 cm on image number 39/11. This previously measured 4.4 x 2.3 cm in corresponding dimensions. 3 mm nodule more inferiorly in the outer right upper lobe on image number 47/11,  without significant change. Four additional smaller nodules in the superior aspect of the right middle  lobe, without significant change. Mild bilateral centrilobular bullous changes without significant change. Mild peripheral eccentric or a shin of the interstitial markings in both lungs without significant change. No pleural fluid. Musculoskeletal: Lytic and sclerotic changes involving the T8 vertebral body with interval 50% collapse of the vertebral body and soft tissue extending into the anterior aspect of the spinal canal, causing moderate canal stenosis. There is also mild diffuse paraspinal extension extending superiorly and inferiorly. This is also involving the pedicles and posterior elements bilaterally with a pars fracture on the left. The fracture is nondisplaced. Lytic lesion in the posterior, superior aspect of the T9 vertebral body. Additional lytic lesion involving the inferior aspects of the T9 inferior facets without canal extension. There is a suggestion of poorly defined, lytic areas in additional thoracic vertebral bodies. Stable old, mild T11 and T12 superior endplate compression deformities. Thoracic and lower cervical spine degenerative changes. There is also an oval lytic lesion in the posterior aspect of the left 3rd rib. Review of the MIP images confirms the above findings. CT ABDOMEN and PELVIS FINDINGS Hepatobiliary: Interval dilated gallbladder without visible gallstones or wall thickening. No pericholecystic fluid. Unremarkable liver. Pancreas: Moderate diffuse pancreatic atrophy. Spleen: Normal in size without focal abnormality. Adrenals/Urinary Tract: Adrenal glands are unremarkable. Kidneys are normal, without renal calculi, focal lesion, or hydronephrosis. Bladder is unremarkable. Stomach/Bowel: Stomach is within normal limits. Appendix appears normal. No evidence of bowel wall thickening, distention, or inflammatory changes. Vascular/Lymphatic: Atheromatous arterial calcifications without aneurysm. No enlarged lymph nodes. Reproductive: Uterus and bilateral adnexa are unremarkable.  Other: Small umbilical hernia containing fat. Musculoskeletal: Left hip prosthesis with associated streak artifacts. Posterior left iliac bone lytic lesion. Anterior, inferior L2 vertebral body lytic lesion with mild paraspinal extension. Anterior, superior S1 vertebral lytic lesion. L4 kyphoplasty and small lytic areas. Review of the MIP images confirms the above findings. IMPRESSION: 1. Right upper lobe lung cancer with metastatic mediastinal and right hilar adenopathy and possible metastatic left hilar adenopathy. 2. Multiple bony metastases with interval collapse of the T8 vertebral body with paraspinal extension of tumor, including into the anterior aspect of the spinal canal, causing moderate canal stenosis. This could progressed to cord compression. 3. Multiple small right lung nodules, suspicious for possible metastases. 4. No pulmonary emboli. 5. No acute abnormality in the abdomen or pelvis other than interval gallbladder dilatation. 6.  Calcific coronary artery and aortic atherosclerosis. Critical Value/emergent results were called by telephone at the time of interpretation on 05/02/2021 at 6:02 pm to provider [facility] , who verbally acknowledged these results. Electronically Signed   By: Claudie Revering M.D.   On: 05/02/2021 18:02   MR TOTAL SPINE METS SCREENING  Result Date: 05/02/2021 CLINICAL DATA:  Metastatic disease with T8 fracture EXAM: MRI TOTAL SPINE WITHOUT AND WITH CONTRAST TECHNIQUE: Multisequence MR imaging of the spine from the cervical spine to the sacrum was performed prior to and following IV contrast administration for evaluation of spinal metastatic disease. CONTRAST:  78mL GADAVIST GADOBUTROL 1 MMOL/ML IV SOLN COMPARISON:  None. FINDINGS: The examination is severely degraded by motion. Additionally, there are substantial technical artifacts affecting the scan that are of unclear etiology. MRI CERVICAL SPINE FINDINGS Alignment: Normal Vertebrae: No fracture, evidence of discitis, or  bone lesion. Cord: Limited visualization of the spinal cord due to motion. Question some hyperintense T2-weighted signal in the lower cervical spinal cord. Posterior Fossa, vertebral arteries,  paraspinal tissues: Unremarkable Disc levels: There is multilevel degenerative disc disease with mild spinal canal stenosis at C3-4, C4-5 and C5-6. MRI THORACIC SPINE FINDINGS Alignment:  Physiologic. Vertebrae: Wedge compression fracture of T8 with approximately 25% height loss. There is also abnormal signal extending into the posterior elements of T8. Signal changes at the inferior T7 and superior T9 endplates are likely due to edema. Cord:  Normal signal. Paraspinal and other soft tissues: 15 mm nodule in the right upper lobe. Disc levels: There is at least moderate spinal canal stenosis at the T8 level. Mild deformity of the spinal cord without signal change. MRI LUMBAR SPINE FINDINGS Segmentation:  Standard Alignment:  Third grade 1 anterolisthesis at L4-5 Vertebrae: Abnormal signal in the L2, L4 and S1 bodies compatible with metastatic disease. Conus medullaris: Extends to the L1 level and appears normal. Paraspinal and other soft tissues: Negative Disc levels: No lumbar spinal canal stenosis. Mild bilateral L4 neural foraminal stenosis. IMPRESSION: 1. Severely motion degraded examination. 2. Wedge compression fracture of T8 with approximately 25% height loss. Abnormal signal extending into the posterior elements of T8 suggests a pathologic fracture. 3. At least moderate spinal canal stenosis at the T8 level with mild deformity of the spinal cord. 4. Abnormal signal in the L2, L4 and S1 bodies compatible with metastatic disease. 5. Multilevel cervical degenerative disc disease with mild spinal canal stenosis at C3-4, C4-5 and C5-6. Electronically Signed   By: Ulyses Jarred M.D.   On: 05/02/2021 21:29       IMPRESSION/PLAN: 1. Stage IV, NSCLC, squamous cell carcinoma with bone metastases. Dr. Lisbeth Renshaw is aware of the  patient's case and has personally reviewed her imaging and course to date. Dr. Sondra Come had also been aware of this pt last week but with covid protocols could not proceed with treatment planning or administration. The patient has multiple levels of disease in the spine, but the location of most concern is in the T spine at T8 with compression fracture and involvement of the spinal canal. She would benefit from palliative radiotherapy to protect her cord and long term neurologic function.  We discussed the risks, benefits, short, and long term effects of radiotherapy, as well as the palliative intent, and the patient is interested in proceeding. We discussed  the delivery and logistics of radiotherapy and anticipates a course of 2 weeks of radiotherapy. We will start simulation tomorrow and treatment on Wednesday to the T spine. I will also discuss with Dr. Lisbeth Renshaw her symptoms of low back pain given her multilevel spinal disease.   In a visit lasting 60 minutes, greater than 50% of the time was spent by phone with the patient and her husband and in floor time discussing the patient's condition, in preparation for the discussion, and coordinating the patient's care. The patient has been sleepy and her husband states she is not able to sign written consent when taking pain medication but he feels that as her next of kin he agrees with proceeding and gives verbal consent for [place] to proceed tomorrow.    Carola Rhine, [facility]   **Disclaimer: This note was dictated with voice recognition software. Similar sounding words can inadvertently be transcribed and this note may contain transcription errors which may not have been corrected upon publication of note.**

## 2021-05-11 NOTE — Progress Notes (Signed)
I spoke with her husband again to review the need to order an MRI brain for staging purposes which he is in agreement with. Orders were placed.    Carola Rhine, PAC

## 2021-05-11 NOTE — Care Management Important Message (Signed)
Important Message  Patient Details IM Letter given to the Patient. Name: [name] MRN: [id number] Date of Birth: 12-24-1951   Medicare Important Message Given:  Yes     Kerin Salen 05/11/2021, 10:11 AM

## 2021-05-11 NOTE — Progress Notes (Signed)
PROGRESS NOTE    [name]  BRA:[id number] DOB: 09-04-1951 DOA: 05/02/2021 PCP: Benay Pike, MD   Chief Complain: Back pain  Brief Narrative: Patient is a 70 year old female with history of chronic A. fib on Eliquis, bladder cancer, hypertension, coronary artery disease, OSA, hypothyroidism, CKD stage IIIa, type of diabetes, recent COVID who presents with complaint of back pain.  Patient was also found to have postoperative pneumonia due to right upper lobe mass/T8 compression fracture.  Neurosurgery recommended steroids and pain control.  She underwent bronchoscopy/biopsy of the right upper lobe mass on 5/12.  She was started on antibiotic for postobstructive pneumonia.  Plan is to discharge to skilled nursing facility, will be off isolation on 5/17.  Radiation oncology consulted today for consideration of radiation treatment for T8 compression fracture.  Assessment & Plan:   Principal Problem:   Lung cancer metastatic to bone [facility]) Active Problems:   Hypothyroidism   Essential hypertension, benign   Atrial fibrillation (HCC)   Diabetes ([place])   Acute-on-chronic kidney injury ([place])   COVID-19 virus infection   Pneumonia due to COVID-19 virus  Acute hypoxic respiratory failure: Secondary to postobstructive pneumonia from right upper lobe mass/covid.  Still requiring 3 L of oxygen per minute.  Not on oxygen at baseline.  She may need oxygen on discharge.  We will do a follow-up chest x-ray today.  She also looks mildly volume overloaded, we might consider dose of Lasix IV after checking chest x-ray.  Postobstructive pneumonia:Presented with shortness of breath, hypoxic on presentation, saturation was 74% room air.  Finished the course of Unasyn.  Continue as needed bronchodilators, incentive spirometer.  Sputum culture showed normal flora.  Blood cultures negative.  Right upper lobe mass with metastasis: CT scan showed right upper lobe mass with metastatic mediastinal/hilar  lymphadenopathy as well as bony metastasis.  She was recently treated for L4 compression fracture.  CT PE was negative.  The lung mass appeared to have increased in size over few weeks.  Status post bronchoscopy and biopsy on 5/12.  Pathology pending.  PCCM were following.  We will consult oncology on discharge to make sure she follows as an outpatient.  T8 compression fracture: As seen on the CT scan.  Likely pathological fracture.  Seen by neurosurgery Dr Annette Stable on 5/9.  Neurosurgery recommended pain management, steroids, no surgical intervention.  We have consulted radiation oncology for consideration for radiation therapy for T8 compression fracture. She initially presented with back pain.  Continue supportive care, pain management.  PT/OT evaluation done, recommended skilled nursing facility on discharge.  COVID-19 infection: First positive test on 4/20,022.  Again positive on 5/7.  She was treated with monupiravir from 4/28-5/3.  CT scan of the chest did not show any worsening infiltrate.  No indication for treatment for now.  She will be off isolation on 05/12/2021.  She is requiring 3 L of oxygen per minute.  Permanent A. fib with RVR: Currently heart rate is controlled with Cardizem.  Dose was increased to 300 from 240 mg.  Eliquis for anticoagulation.  Type II diabetes mellitus: Hemoglobin A1C of 6.2.  Continue to monitor.  Continue sliding-scale insulin.  AKI on CKD stage IIIa: Currently kidney function near baseline.  Baseline creatinine around 1.05.  Elevated LFTs: Previous LFTs were normal.  Could be associated  with COVID infection.  Scanning showed dilated gallbladder without visible gallstones or wall thickening, no pericholecystic fluid, unremarkable liver.  Hepatitis panel negative.  Statin discontinued.  Monitor.  Left  hip unstageable ulcer: Present on admission.  Continue foam dressing.  Wound care consulted.  Hypothyroidism: On Synthyroid  Constipation: Continue bowel  regimen  Obesity: BMI of 32.9.  Debility/deconditioning: PT/OT recommending SNF on discharge.  Patient remains extremely weak      Nutrition Problem: Increased nutrient needs Etiology: acute illness,catabolic illness (YSAYT-01 infection)      DVT prophylaxis:Lovenox Code Status: Full Family Communication: Called and discussed with husband on phone on 05/11/21 Status is: Inpatient  Remains inpatient appropriate because:Inpatient level of care appropriate due to severity of illness   Dispo: The patient is from: Home              Anticipated d/c is to: SNF              Patient currently is not medically stable to d/c.   Difficult to place patient No    Consultants: PCCM, radiation oncology, neurosurgery  Procedures: Bronchoscopy/biopsy  Antimicrobials:  Anti-infectives (From admission, onward)   Start     Dose/Rate Route Frequency Ordered Stop   05/10/21 0000  Ampicillin-Sulbactam (UNASYN) 3 g in sodium chloride 0.9 % 100 mL IVPB        3 g 200 mL/hr over 30 Minutes Intravenous Every 6 hours 05/09/21 1652 05/11/21 0027   05/06/21 1200  Ampicillin-Sulbactam (UNASYN) 3 g in sodium chloride 0.9 % 100 mL IVPB  Status:  Discontinued        3 g 200 mL/hr over 30 Minutes Intravenous Every 6 hours 05/06/21 0933 05/09/21 1652   05/03/21 1230  Ampicillin-Sulbactam (UNASYN) 3 g in sodium chloride 0.9 % 100 mL IVPB  Status:  Discontinued        3 g 200 mL/hr over 30 Minutes Intravenous Every 6 hours 05/03/21 1136 05/03/21 1140   05/03/21 1230  cefTRIAXone (ROCEPHIN) 2 g in sodium chloride 0.9 % 100 mL IVPB  Status:  Discontinued        2 g 200 mL/hr over 30 Minutes Intravenous Every 24 hours 05/03/21 1140 05/06/21 0933   05/03/21 1230  azithromycin (ZITHROMAX) tablet 500 mg  Status:  Discontinued        500 mg Oral Daily 05/03/21 1140 05/06/21 0933   05/02/21 1615  piperacillin-tazobactam (ZOSYN) IVPB 3.375 g        3.375 g 100 mL/hr over 30 Minutes Intravenous  Once 05/02/21  1603 05/02/21 1758      Subjective:  Patient seen and examined the bedside this morning.  Hemodynamically stable but very weak, bedbound.  Requiring 3 liters of oxygen.  Has some cough.  Did not complain of any back pain today.   Objective: Vitals:   05/10/21 1759 05/10/21 2016 05/11/21 0531 05/11/21 1228  BP: 120/70 131/71 (!) 136/91 106/78  Pulse: 91 92 96 (!) 108  Resp: 17 18 16 20   Temp: (!) 97.4 F (36.3 C) 97.8 F (36.6 C) 97.6 F (36.4 C) 97.8 F (36.6 C)  TempSrc: Oral Oral Oral Oral  SpO2: 93% 96% 94% 93%  Weight:      Height:        Intake/Output Summary (Last 24 hours) at 05/11/2021 1246 Last data filed at 05/11/2021 1028 Gross per 24 hour  Intake 785.26 ml  Output 2625 ml  Net -1839.74 ml   Filed Weights   05/02/21 1347  Weight: 84.4 kg    Examination:  General exam: Extremity congestion, debilitated, obese, chronically ill looking HEENT:PERRL,Oral mucosa moist, Ear/Nose normal on gross exam Respiratory system: Crackles on the left base  Cardiovascular system: S1 & S2 heard, RRR. No JVD, murmurs, rubs, gallops or clicks.  Gastrointestinal system: Abdomen is nondistended, soft and nontender. No organomegaly or masses felt. Normal bowel sounds heard. Central nervous system: Alert and oriented. No focal neurological deficits. Extremities: Trace bilateral lower extremity edema, no clubbing ,no cyanosis Skin: No rashes, lesions or ulcers,no icterus ,no pallor    Data Reviewed: I have personally reviewed following labs and imaging studies  CBC: Recent Labs  Lab 05/06/21 0426 05/07/21 0350 05/08/21 0417 05/11/21 0408  WBC 17.6* 14.8* 14.6* 18.4*  NEUTROABS 15.4* 13.1* 12.8* 16.2*  HGB 9.9* 10.5* 8.8* 10.1*  HCT 32.5* 33.4* 28.1* 31.7*  MCV 89.0 85.9 86.5 86.1  PLT 287 250 286 025   Basic Metabolic Panel: Recent Labs  Lab 05/06/21 0426 05/07/21 0350 05/08/21 0417 05/11/21 0408  NA 135 137 136 135  K 3.6 4.2 3.5 4.5  CL 98 100 100 100  CO2  26 27 29 27   GLUCOSE 168* 164* 177* 226*  BUN 25* 25* 22 29*  CREATININE 0.79 1.10* 0.79 0.69  CALCIUM 9.2 9.4 9.1 8.6*  MG 1.9  --   --   --    GFR: Estimated Creatinine Clearance: 65.4 mL/min (by C-G formula based on SCr of 0.69 mg/dL). Liver Function Tests: Recent Labs  Lab 05/06/21 0426 05/07/21 0350 05/08/21 0417 05/10/21 0258 05/11/21 0408  AST 286* 252* 163* 170* 125*  ALT 243* 244* 190* 261* 227*  ALKPHOS 116 111 112 133* 121  BILITOT 0.3 0.9 0.3 0.4 0.4  PROT 6.4* 6.9 6.4* 6.7 6.4*  ALBUMIN 2.4* 2.3* 2.2* 2.4* 2.3*   No results for input(s): LIPASE, AMYLASE in the last 168 hours. No results for input(s): AMMONIA in the last 168 hours. Coagulation Profile: Recent Labs  Lab 05/06/21 0426  INR 1.3*   Cardiac Enzymes: Recent Labs  Lab 05/07/21 0350 05/08/21 0417  CKTOTAL 703* 499*   BNP (last 3 results) No results for input(s): PROBNP in the last 8760 hours. HbA1C: No results for input(s): HGBA1C in the last 72 hours. CBG: Recent Labs  Lab 05/10/21 2013 05/10/21 2356 05/11/21 0408 05/11/21 0752 05/11/21 1225  GLUCAP 189* 247* 214* 216* 238*   Lipid Profile: No results for input(s): CHOL, HDL, LDLCALC, TRIG, CHOLHDL, LDLDIRECT in the last 72 hours. Thyroid Function Tests: No results for input(s): TSH, T4TOTAL, FREET4, T3FREE, THYROIDAB in the last 72 hours. Anemia Panel: No results for input(s): VITAMINB12, FOLATE, FERRITIN, TIBC, IRON, RETICCTPCT in the last 72 hours. Sepsis Labs: Recent Labs  Lab 05/07/21 0417 05/07/21 1046  LATICACIDVEN 3.0* 1.7    Recent Results (from the past 240 hour(s))  Culture, blood (routine x 2)     Status: None   Collection Time: 05/02/21  2:50 PM   Specimen: BLOOD  Result Value Ref Range Status   Specimen Description   Final    BLOOD BLOOD RIGHT ARM Performed at [facility] [address], [place], [place] [address]    Special Requests   Final    BOTTLES DRAWN AEROBIC AND ANAEROBIC  Blood Culture results may not be optimal due to an inadequate volume of blood received in culture bottles Performed at [facility] [address] [place], [place] [address]    Culture   Final    NO GROWTH 5 DAYS Performed at [facility], [place] [address] [place], [place] [address]    Report Status 05/07/2021 FINAL  Final  Culture, blood (routine  x 2)     Status: None   Collection Time: 05/02/21  2:55 PM   Specimen: BLOOD  Result Value Ref Range Status   Specimen Description   Final    BLOOD BLOOD LEFT ARM Performed at [facility] [address], [place], [place] [address]    Special Requests   Final    BOTTLES DRAWN AEROBIC AND ANAEROBIC Blood Culture results may not be optimal due to an inadequate volume of blood received in culture bottles Performed at [facility] [address] [place], [place] [address]    Culture   Final    NO GROWTH 5 DAYS Performed at [facility], [place] [address] [place], [place] [address]    Report Status 05/07/2021 FINAL  Final  Resp Panel by RT-PCR (Flu A&B, Covid) Nasopharyngeal Swab     Status: Abnormal   Collection Time: 05/02/21  4:10 PM   Specimen: Nasopharyngeal Swab; Nasopharyngeal(NP) swabs in vial transport medium  Result Value Ref Range Status   SARS Coronavirus 2 by RT PCR POSITIVE (A) NEGATIVE Final    Comment: RESULT CALLED TO, READ BACK BY AND VERIFIED WITH: LITHICUM,E. RN AT 1745 05/02/21 MULLINS,T (NOTE) SARS-CoV-2 target nucleic acids are DETECTED.  The SARS-CoV-2 RNA is generally detectable in upper respiratory specimens during the acute phase of infection. Positive results are indicative of the presence of the identified virus, but do not rule out bacterial infection or co-infection with other pathogens not detected by the test. Clinical correlation with patient history and other diagnostic information is necessary to determine patient infection status.  The expected result is Negative.  Fact Sheet for Patients: EntrepreneurPulse.com.au  Fact Sheet for Healthcare Providers: IncredibleEmployment.be  This test is not yet approved or cleared by the [place] FDA and  has been authorized for detection and/or diagnosis of SARS-CoV-2 by FDA under an Emergency Use Authorization (EUA).  This EUA will remain in effect (meaning this tes t can be used) for the duration of  the COVID-19 declaration under Section 564(b)(1) of the Act, 21 U.S.C. section 360bbb-3(b)(1), unless the authorization is terminated or revoked sooner.     Influenza A by PCR NEGATIVE NEGATIVE Final   Influenza B by PCR NEGATIVE NEGATIVE Final    Comment: (NOTE) The Xpert Xpress SARS-CoV-2/FLU/RSV plus assay is intended as an aid in the diagnosis of influenza from Nasopharyngeal swab specimens and should not be used as a sole basis for treatment. Nasal washings and aspirates are unacceptable for Xpert Xpress SARS-CoV-2/FLU/RSV testing.  Fact Sheet for Patients: EntrepreneurPulse.com.au  Fact Sheet for Healthcare Providers: IncredibleEmployment.be  This test is not yet approved or cleared by the [place] FDA and has been authorized for detection and/or diagnosis of SARS-CoV-2 by FDA under an Emergency Use Authorization (EUA). This EUA will remain in effect (meaning this test can be used) for the duration of the COVID-19 declaration under Section 564(b)(1) of the Act, 21 U.S.C. section 360bbb-3(b)(1), unless the authorization is terminated or revoked.  Performed at [facility], [place] [address] [place],  [place] [address]   Urine culture     Status: Abnormal   Collection Time: 05/02/21  5:01 PM   Specimen: Urine, Random  Result Value Ref Range Status   Specimen Description   Final    URINE, RANDOM Performed at [facility] [address]  [place], [place] [address]    Special Requests   Final    NONE Performed at [facility]  [place] [address] [place], [place] [address]    Culture (A)  Final    60,000 COLONIES/mL STREPTOCOCCUS AGALACTIAE TESTING AGAINST S. AGALACTIAE NOT ROUTINELY PERFORMED DUE TO PREDICTABILITY OF AMP/PEN/VAN SUSCEPTIBILITY. Performed at [facility], [place] [address] [place], [place] [address]    Report Status 05/03/2021 FINAL  Final  Expectorated Sputum Assessment w Gram Stain, Rflx to Resp Cult     Status: None   Collection Time: 05/07/21  6:03 PM   Specimen: Expectorated Sputum  Result Value Ref Range Status   Specimen Description EXPECTORATED SPUTUM  Final   Special Requests NONE  Final   Sputum evaluation   Final    THIS SPECIMEN IS ACCEPTABLE FOR SPUTUM CULTURE Performed at [facility], [place] [address], [place], [place] [address]    Report Status 05/07/2021 FINAL  Final  Culture, Respiratory w Gram Stain     Status: None   Collection Time: 05/07/21  6:03 PM  Result Value Ref Range Status   Specimen Description   Final    EXPECTORATED SPUTUM Performed at [facility] [address] [place], [place] [address]    Special Requests   Final    NONE Reflexed from [id number] Performed at [facility], [place] [address] [place], [place] [address]    Gram Stain   Final    MODERATE WBC PRESENT,BOTH PMN AND MONONUCLEAR RARE GRAM POSITIVE COCCI IN PAIRS    Culture   Final    RARE Consistent with normal respiratory flora. No Pseudomonas species isolated Performed at [facility] [address], [place], [place] [address]    Report Status 05/10/2021 FINAL  Final         Radiology Studies: No results found.      Scheduled Meds: . (feeding supplement) PROSource Plus  30 mL Oral BID BM  . apixaban  5 mg Oral BID  . vitamin C  500 mg Oral Daily  . bisacodyl  10 mg Rectal Daily  . chlorhexidine  15 mL Mouth  Rinse BID  . dexamethasone (DECADRON) injection  4 mg Intravenous Q12H  . diltiazem  300 mg Oral Daily  . feeding supplement  237 mL Oral BID BM  . insulin aspart  0-5 Units Subcutaneous QHS  . insulin aspart  0-9 Units Subcutaneous TID WC  . levothyroxine  100 mcg Oral Q0600  . mouth rinse  15 mL Mouth Rinse q12n4p  . nutrition supplement (JUVEN)  1 packet Oral BID BM  . polyethylene glycol  17 g Oral BID  . senna-docusate  1 tablet Oral BID  . zinc sulfate  220 mg Oral Daily   Continuous Infusions:   LOS: 8 days    Time spent: 35 mins.More than 50% of that time was spent in counseling and/or coordination of care.      Shelly Coss, MD [facility] P5/16/2022, 12:46 PM

## 2021-05-12 ENCOUNTER — Ambulatory Visit
Admit: 2021-05-12 | Discharge: 2021-05-12 | Disposition: A | Discharge status: Home / Self Care | Payer: Medicare Other | Attending: Radiation Oncology | Admitting: Radiation Oncology

## 2021-05-12 DIAGNOSIS — C7951 Secondary malignant neoplasm of bone: Secondary | ICD-10-CM | POA: Diagnosis not present

## 2021-05-12 DIAGNOSIS — C3490 Malignant neoplasm of unspecified part of unspecified bronchus or lung: Secondary | ICD-10-CM | POA: Diagnosis not present

## 2021-05-12 LAB — CBC WITH DIFFERENTIAL/PLATELET
Abs Immature Granulocytes: 0.45 10*3/uL — ABNORMAL HIGH (ref 0.00–0.07)
Basophils Absolute: 0 10*3/uL (ref 0.0–0.1)
Basophils Relative: 0 %
Eosinophils Absolute: 0 10*3/uL (ref 0.0–0.5)
Eosinophils Relative: 0 %
HCT: 36.5 % (ref 36.0–46.0)
Hemoglobin: 11.5 g/dL — ABNORMAL LOW (ref 12.0–15.0)
Immature Granulocytes: 2 %
Lymphocytes Relative: 6 %
Lymphs Abs: 1.2 10*3/uL (ref 0.7–4.0)
MCH: 27.1 pg (ref 26.0–34.0)
MCHC: 31.5 g/dL (ref 30.0–36.0)
MCV: 86.1 fL (ref 80.0–100.0)
Monocytes Absolute: 1.4 10*3/uL — ABNORMAL HIGH (ref 0.1–1.0)
Monocytes Relative: 7 %
Neutro Abs: 18.2 10*3/uL — ABNORMAL HIGH (ref 1.7–7.7)
Neutrophils Relative %: 85 %
Platelets: 348 10*3/uL (ref 150–400)
RBC: 4.24 MIL/uL (ref 3.87–5.11)
RDW: 16.6 % — ABNORMAL HIGH (ref 11.5–15.5)
WBC: 21.4 10*3/uL — ABNORMAL HIGH (ref 4.0–10.5)
nRBC: 0.2 % (ref 0.0–0.2)

## 2021-05-12 LAB — COMPREHENSIVE METABOLIC PANEL
ALT: 196 U/L — ABNORMAL HIGH (ref 0–44)
AST: 91 U/L — ABNORMAL HIGH (ref 15–41)
Albumin: 2.5 g/dL — ABNORMAL LOW (ref 3.5–5.0)
Alkaline Phosphatase: 119 U/L (ref 38–126)
Anion gap: 9 (ref 5–15)
BUN: 35 mg/dL — ABNORMAL HIGH (ref 8–23)
CO2: 28 mmol/L (ref 22–32)
Calcium: 8.9 mg/dL (ref 8.9–10.3)
Chloride: 95 mmol/L — ABNORMAL LOW (ref 98–111)
Creatinine, Ser: 0.63 mg/dL (ref 0.44–1.00)
GFR, Estimated: >60 mL/min (ref >60)
Glucose, Bld: 239 mg/dL — ABNORMAL HIGH (ref 70–99)
Potassium: 4.5 mmol/L (ref 3.5–5.1)
Sodium: 132 mmol/L — ABNORMAL LOW (ref 135–145)
Total Bilirubin: 0.6 mg/dL (ref 0.3–1.2)
Total Protein: 7 g/dL (ref 6.5–8.1)

## 2021-05-12 LAB — CYTOLOGY - NON PAP

## 2021-05-12 LAB — SURGICAL PATHOLOGY

## 2021-05-12 LAB — GLUCOSE, CAPILLARY
Glucose-Capillary: 196 mg/dL — ABNORMAL HIGH (ref 70–99)
Glucose-Capillary: 229 mg/dL — ABNORMAL HIGH (ref 70–99)
Glucose-Capillary: 168 mg/dL — ABNORMAL HIGH (ref 70–99)
Glucose-Capillary: 220 mg/dL — ABNORMAL HIGH (ref 70–99)

## 2021-05-12 MED ORDER — FUROSEMIDE 10 MG/ML IJ SOLN
40.0000 mg | Freq: Once | INTRAMUSCULAR | Status: AC
  Administered 2021-05-12: 40 mg via INTRAVENOUS
  Filled 2021-05-12: qty 4

## 2021-05-12 MED ORDER — GUAIFENESIN ER 600 MG PO TB12
1200.0000 mg | ORAL_TABLET | Freq: Two times a day (BID) | ORAL | Status: DC
  Administered 2021-05-12 – 2021-05-15 (×8): 1200 mg via ORAL
  Filled 2021-05-12 (×8): qty 2

## 2021-05-12 MED ORDER — HYDROMORPHONE HCL 1 MG/ML IJ SOLN
0.5000 mg | INTRAMUSCULAR | Status: DC | PRN
  Administered 2021-05-12 – 2021-05-13 (×6): 0.5 mg via INTRAVENOUS
  Filled 2021-05-12 (×6): qty 0.5

## 2021-05-12 MED ORDER — LEVALBUTEROL HCL 1.25 MG/0.5ML IN NEBU: 1.2500 mg | INHALATION_SOLUTION | Freq: Four times a day (QID) | RESPIRATORY_TRACT | Status: DC

## 2021-05-12 MED ORDER — DILTIAZEM HCL-DEXTROSE 125-5 MG/125ML-% IV SOLN (PREMIX)
5.0000 mg/h | INTRAVENOUS | Status: DC
  Administered 2021-05-12: 5 mg/h via INTRAVENOUS
  Administered 2021-05-13: 15 mg/h via INTRAVENOUS
  Filled 2021-05-12 (×3): qty 125

## 2021-05-12 MED ORDER — HYDROCOD POLST-CPM POLST ER 10-8 MG/5ML PO SUER
5.0000 mL | Freq: Two times a day (BID) | ORAL | Status: DC
Start: 2021-05-12 — End: 2021-05-17
  Administered 2021-05-12 – 2021-05-15 (×8): 5 mL via ORAL
  Filled 2021-05-12 (×8): qty 5

## 2021-05-12 NOTE — Progress Notes (Signed)
Cardizem drip 15 mg/hr. Pt is tolerating well. Rate is  96 -105. Will continue to monitor.

## 2021-05-12 NOTE — Plan of Care (Signed)

## 2021-05-12 NOTE — Progress Notes (Signed)
PROGRESS NOTE    [name]  JKK:[id number] DOB: [birth year] DOA: 05/02/2021 PCP: Benay Pike, MD   Chief Complain: Back pain  Brief Narrative: Patient is a 70 year old female with history of chronic A. fib on Eliquis, bladder cancer, hypertension, coronary artery disease, OSA, hypothyroidism, CKD stage IIIa, type of diabetes, recent COVID who presents with complaint of back pain.  Patient was also found to have postobstructive pneumonia due to right upper lobe mass/T8 compression fracture.  Neurosurgery recommended steroids and pain control.  She underwent bronchoscopy/biopsy of the right upper lobe mass on 5/12.  She was started on antibiotic for postobstructive pneumonia. Radiation oncology consulted,pan for  radiation treatment for T8 compression fracture. Ultimate plan is to discharge to skilled nursing facility, will be off isolation on 5/17.    Assessment & Plan:   Principal Problem:   Lung cancer metastatic to bone [facility]) Active Problems:   Hypothyroidism   Essential hypertension, benign   Atrial fibrillation (HCC)   Diabetes ([place])   Acute-on-chronic kidney injury ([place])   COVID-19 virus infection   Pneumonia due to COVID-19 virus  Acute hypoxic respiratory failure: Secondary to postobstructive pneumonia from right upper lobe mass/covid.  Still requiring 3-4 L of oxygen per minute.  Not on oxygen at baseline.  She may need oxygen on discharge.  Chest x-ray done on 05/11/21 showed right upper lobe mass, no pneumonia or fluid.    She also looked mildly volume overloaded,given 2 does of Lasix IV .Given another dose of lasix today.  Postobstructive pneumonia:Presented with shortness of breath, hypoxic on presentation, saturation was 74% room air.  Finished the course of Unasyn.  Continue as needed bronchodilators, incentive spirometer.  Sputum culture showed normal flora.  Blood cultures negative.  Still requiring 3 to 4 L of oxygen per minute.  We will continue to try to  wean  Right upper lobe mass with metastasis: CT scan showed right upper lobe mass with metastatic mediastinal/hilar lymphadenopathy as well as bony metastasis.  She was recently treated for L4 compression fracture.  CT PE was negative.  The lung mass appeared to have increased in size over few weeks.  Status post bronchoscopy and biopsy on 5/12.  Pathology pending.  PCCM were following.  We will consult oncology on discharge to make sure she follows as an outpatient.She will also follow up with PCCM on discharge.  T8 compression fracture: As seen on the CT scan.  Likely pathological fracture.  Seen by neurosurgery Dr Annette Stable on 5/9.  Neurosurgery recommended pain management, steroids, no surgical intervention.  Radiation oncology planning  for radiation therapy for T8 compression fracture. She initially presented with back pain.  Continue supportive care, pain management.  PT/OT evaluation done, recommended skilled nursing facility on discharge.  COVID-19 infection: First positive test on 4/20,022.  Again positive on 5/7.  She was treated with monupiravir from 4/28-5/3.  CT scan of the chest did not show any worsening infiltrate.  No indication for treatment for now.  She will be off isolation on 05/12/2021.  She is requiring 3-4 L of oxygen per minute.  Permanent A. fib with RVR: She was on oral cardizem.  This morning she was in A. fib with RVR so started on Cardizem drip.On  Eliquis for anticoagulation.  Type II diabetes mellitus: Hemoglobin A1C of 6.2.  Continue to monitor.  Continue sliding-scale insulin.  AKI on CKD stage IIIa: Currently kidney function near baseline.  Baseline creatinine around 1.05.  Elevated LFTs: Previous LFTs were normal.  Could be associated  with COVID infection.  Scanning showed dilated gallbladder without visible gallstones or wall thickening, no pericholecystic fluid, unremarkable liver.  Hepatitis panel negative.  Statin discontinued.  Monitor.  Left hip unstageable  ulcer: Present on admission.  Continue foam dressing.  Wound care consulted.  Hypothyroidism: On Synthyroid  Constipation: Continue bowel regimen  Obesity: BMI of 32.9.  Debility/deconditioning: PT/OT recommending SNF on discharge.  Patient remains extremely weak      Nutrition Problem: Increased nutrient needs Etiology: acute illness,catabolic illness (ZOXWR-60 infection)      DVT prophylaxis:Lovenox Code Status: Full Family Communication: Discussed with daughter at bedside on 05/12/21 Status is: Inpatient  Remains inpatient appropriate because:Inpatient level of care appropriate due to severity of illness   Dispo: The patient is from: Home              Anticipated d/c is to: SNF              Patient currently is not medically stable to d/c.   Difficult to place patient No    Consultants: PCCM, radiation oncology, neurosurgery  Procedures: Bronchoscopy/biopsy  Antimicrobials:  Anti-infectives (From admission, onward)   Start     Dose/Rate Route Frequency Ordered Stop   05/10/21 0000  Ampicillin-Sulbactam (UNASYN) 3 g in sodium chloride 0.9 % 100 mL IVPB        3 g 200 mL/hr over 30 Minutes Intravenous Every 6 hours 05/09/21 1652 05/11/21 0027   05/06/21 1200  Ampicillin-Sulbactam (UNASYN) 3 g in sodium chloride 0.9 % 100 mL IVPB  Status:  Discontinued        3 g 200 mL/hr over 30 Minutes Intravenous Every 6 hours 05/06/21 0933 05/09/21 1652   05/03/21 1230  Ampicillin-Sulbactam (UNASYN) 3 g in sodium chloride 0.9 % 100 mL IVPB  Status:  Discontinued        3 g 200 mL/hr over 30 Minutes Intravenous Every 6 hours 05/03/21 1136 05/03/21 1140   05/03/21 1230  cefTRIAXone (ROCEPHIN) 2 g in sodium chloride 0.9 % 100 mL IVPB  Status:  Discontinued        2 g 200 mL/hr over 30 Minutes Intravenous Every 24 hours 05/03/21 1140 05/06/21 0933   05/03/21 1230  azithromycin (ZITHROMAX) tablet 500 mg  Status:  Discontinued        500 mg Oral Daily 05/03/21 1140 05/06/21 0933    05/02/21 1615  piperacillin-tazobactam (ZOSYN) IVPB 3.375 g        3.375 g 100 mL/hr over 30 Minutes Intravenous  Once 05/02/21 1603 05/02/21 1758      Subjective:  Patient seen and examined at the bedside this morning.  On 4 L of oxygen per minute.  Coughing, complains of chest discomfort, shortness of breath.  Daughter  Present at bedisde.  Looks very deconditioned, debilitated  Objective: Vitals:   05/11/21 1228 05/11/21 2027 05/12/21 0424 05/12/21 0805  BP: 106/78 139/72 139/82 126/77  Pulse: (!) 108 90 91 83  Resp: 20 20 (!) 22 18  Temp: 97.8 F (36.6 C) (!) 97.4 F (36.3 C) 97.7 F (36.5 C)   TempSrc: Oral Oral Oral   SpO2: 93% 94% 100% 95%  Weight:      Height:        Intake/Output Summary (Last 24 hours) at 05/12/2021 0822 Last data filed at 05/12/2021 0432 Gross per 24 hour  Intake --  Output 1450 ml  Net -1450 ml   Filed Weights   05/02/21 1347  Weight: 84.4  kg    Examination:  General exam: Very deconditioned, debilitated, chronically ill looking HEENT: PERRL Respiratory system: Crackles on the left base, diminished air entry Cardiovascular system: A. fib with RVR Gastrointestinal system: Abdomen is nondistended, soft and nontender. Central nervous system: Alert and oriented, weak Extremities: No edema, no clubbing ,no cyanosis Skin: No rashes, no ulcers,no icterus     Data Reviewed: I have personally reviewed following labs and imaging studies  CBC: Recent Labs  Lab 05/06/21 0426 05/07/21 0350 05/08/21 0417 05/11/21 0408  WBC 17.6* 14.8* 14.6* 18.4*  NEUTROABS 15.4* 13.1* 12.8* 16.2*  HGB 9.9* 10.5* 8.8* 10.1*  HCT 32.5* 33.4* 28.1* 31.7*  MCV 89.0 85.9 86.5 86.1  PLT 287 250 286 952   Basic Metabolic Panel: Recent Labs  Lab 05/06/21 0426 05/07/21 0350 05/08/21 0417 05/11/21 0408  NA 135 137 136 135  K 3.6 4.2 3.5 4.5  CL 98 100 100 100  CO2 26 27 29 27   GLUCOSE 168* 164* 177* 226*  BUN 25* 25* 22 29*  CREATININE 0.79 1.10*  0.79 0.69  CALCIUM 9.2 9.4 9.1 8.6*  MG 1.9  --   --   --    GFR: Estimated Creatinine Clearance: 65.4 mL/min (by C-G formula based on SCr of 0.69 mg/dL). Liver Function Tests: Recent Labs  Lab 05/06/21 0426 05/07/21 0350 05/08/21 0417 05/10/21 0258 05/11/21 0408  AST 286* 252* 163* 170* 125*  ALT 243* 244* 190* 261* 227*  ALKPHOS 116 111 112 133* 121  BILITOT 0.3 0.9 0.3 0.4 0.4  PROT 6.4* 6.9 6.4* 6.7 6.4*  ALBUMIN 2.4* 2.3* 2.2* 2.4* 2.3*   No results for input(s): LIPASE, AMYLASE in the last 168 hours. No results for input(s): AMMONIA in the last 168 hours. Coagulation Profile: Recent Labs  Lab 05/06/21 0426  INR 1.3*   Cardiac Enzymes: Recent Labs  Lab 05/07/21 0350 05/08/21 0417  CKTOTAL 703* 499*   BNP (last 3 results) No results for input(s): PROBNP in the last 8760 hours. HbA1C: No results for input(s): HGBA1C in the last 72 hours. CBG: Recent Labs  Lab 05/11/21 0752 05/11/21 1225 05/11/21 1735 05/11/21 2030 05/12/21 0801  GLUCAP 216* 238* 272* 299* 196*   Lipid Profile: No results for input(s): CHOL, HDL, LDLCALC, TRIG, CHOLHDL, LDLDIRECT in the last 72 hours. Thyroid Function Tests: No results for input(s): TSH, T4TOTAL, FREET4, T3FREE, THYROIDAB in the last 72 hours. Anemia Panel: No results for input(s): VITAMINB12, FOLATE, FERRITIN, TIBC, IRON, RETICCTPCT in the last 72 hours. Sepsis Labs: Recent Labs  Lab 05/07/21 0417 05/07/21 1046  LATICACIDVEN 3.0* 1.7    Recent Results (from the past 240 hour(s))  Culture, blood (routine x 2)     Status: None   Collection Time: 05/02/21  2:50 PM   Specimen: BLOOD  Result Value Ref Range Status   Specimen Description   Final    BLOOD BLOOD RIGHT ARM Performed at [facility] [address] [place], [place] [address]    Special Requests   Final    BOTTLES DRAWN AEROBIC AND ANAEROBIC Blood Culture results may not be optimal due to an inadequate volume of blood received in  culture bottles Performed at [facility] [address], [place], [place] [address]    Culture   Final    NO GROWTH 5 DAYS Performed at [facility], [place] [address], [place],  [address]    Report Status 05/07/2021 FINAL  Final  Culture, blood (routine  x 2)     Status: None   Collection Time: 05/02/21  2:55 PM   Specimen: BLOOD  Result Value Ref Range Status   Specimen Description   Final    BLOOD BLOOD LEFT ARM Performed at [facility] [address], [place], [place] [address]    Special Requests   Final    BOTTLES DRAWN AEROBIC AND ANAEROBIC Blood Culture results may not be optimal due to an inadequate volume of blood received in culture bottles Performed at [facility] [address], [place], [place] [address]    Culture   Final    NO GROWTH 5 DAYS Performed at [facility], [place] [address] [place], [place] [address]    Report Status 05/07/2021 FINAL  Final  Resp Panel by RT-PCR (Flu A&B, Covid) Nasopharyngeal Swab     Status: Abnormal   Collection Time: 05/02/21  4:10 PM   Specimen: Nasopharyngeal Swab; Nasopharyngeal(NP) swabs in vial transport medium  Result Value Ref Range Status   SARS Coronavirus 2 by RT PCR POSITIVE (A) NEGATIVE Final    Comment: RESULT CALLED TO, READ BACK BY AND VERIFIED WITH: LITHICUM,E. RN AT 1745 05/02/21 MULLINS,T (NOTE) SARS-CoV-2 target nucleic acids are DETECTED.  The SARS-CoV-2 RNA is generally detectable in upper respiratory specimens during the acute phase of infection. Positive results are indicative of the presence of the identified virus, but do not rule out bacterial infection or co-infection with other pathogens not detected by the test. Clinical correlation with patient history and other diagnostic information is necessary to determine patient infection status. The expected result is Negative.  Fact Sheet for  Patients: EntrepreneurPulse.com.au  Fact Sheet for Healthcare Providers: IncredibleEmployment.be  This test is not yet approved or cleared by the [place] FDA and  has been authorized for detection and/or diagnosis of SARS-CoV-2 by FDA under an Emergency Use Authorization (EUA).  This EUA will remain in effect (meaning this tes t can be used) for the duration of  the COVID-19 declaration under Section 564(b)(1) of the Act, 21 U.S.C. section 360bbb-3(b)(1), unless the authorization is terminated or revoked sooner.     Influenza A by PCR NEGATIVE NEGATIVE Final   Influenza B by PCR NEGATIVE NEGATIVE Final    Comment: (NOTE) The Xpert Xpress SARS-CoV-2/FLU/RSV plus assay is intended as an aid in the diagnosis of influenza from Nasopharyngeal swab specimens and should not be used as a sole basis for treatment. Nasal washings and aspirates are unacceptable for Xpert Xpress SARS-CoV-2/FLU/RSV testing.  Fact Sheet for Patients: EntrepreneurPulse.com.au  Fact Sheet for Healthcare Providers: IncredibleEmployment.be  This test is not yet approved or cleared by the [place] FDA and has been authorized for detection and/or diagnosis of SARS-CoV-2 by FDA under an Emergency Use Authorization (EUA). This EUA will remain in effect (meaning this test can be used) for the duration of the COVID-19 declaration under Section 564(b)(1) of the Act, 21 U.S.C. section 360bbb-3(b)(1), unless the authorization is terminated or revoked.  Performed at [facility], [place] [address], [place], [place] [address]   Urine culture     Status: Abnormal   Collection Time: 05/02/21  5:01 PM   Specimen: Urine, Random  Result Value Ref Range Status   Specimen Description   Final    URINE, RANDOM Performed at [facility] [address] [place], [place] [address]    Special Requests   Final     NONE Performed at [facility]  [place] [address], [place], [place] [address]    Culture (A)  Final    60,000 COLONIES/mL STREPTOCOCCUS AGALACTIAE TESTING AGAINST S. AGALACTIAE NOT ROUTINELY PERFORMED DUE TO PREDICTABILITY OF AMP/PEN/VAN SUSCEPTIBILITY. Performed at [facility], [place] [address], [place], [place] [address]    Report Status 05/03/2021 FINAL  Final  Expectorated Sputum Assessment w Gram Stain, Rflx to Resp Cult     Status: None   Collection Time: 05/07/21  6:03 PM   Specimen: Expectorated Sputum  Result Value Ref Range Status   Specimen Description EXPECTORATED SPUTUM  Final   Special Requests NONE  Final   Sputum evaluation   Final    THIS SPECIMEN IS ACCEPTABLE FOR SPUTUM CULTURE Performed at [facility], [place] [address] [place], [place] [address]    Report Status 05/07/2021 FINAL  Final  Culture, Respiratory w Gram Stain     Status: None   Collection Time: 05/07/21  6:03 PM  Result Value Ref Range Status   Specimen Description   Final    EXPECTORATED SPUTUM Performed at [facility] [address], [place], [place] [address]    Special Requests   Final    NONE Reflexed from [id number] Performed at [facility], [place] [address], [place], [place] [address]    Gram Stain   Final    MODERATE WBC PRESENT,BOTH PMN AND MONONUCLEAR RARE GRAM POSITIVE COCCI IN PAIRS    Culture   Final    RARE Consistent with normal respiratory flora. No Pseudomonas species isolated Performed at [facility] [address] [place], [place] [address]    Report Status 05/10/2021 FINAL  Final         Radiology Studies: DG CHEST PORT 1 VIEW  Result Date: 05/11/2021 CLINICAL DATA:  Shortness of breath, COVID-19 positivity EXAM: PORTABLE CHEST 1 VIEW COMPARISON:  03/15/2021, CT from 05/02/2021 FINDINGS: Cardiac shadow is enlarged in size. Aortic calcifications are again seen. Known right upper  lobe mass lesion and right hilar prominence are again noted and stable. Lungs are otherwise clear. No bony abnormality is seen. IMPRESSION: Changes consistent with the known right upper lobe mass lesion. No new acute abnormality is noted. Electronically Signed   By: Inez Catalina M.D.   On: 05/11/2021 16:26        Scheduled Meds: . (feeding supplement) PROSource Plus  30 mL Oral BID BM  . apixaban  5 mg Oral BID  . vitamin C  500 mg Oral Daily  . bisacodyl  10 mg Rectal Daily  . chlorhexidine  15 mL Mouth Rinse BID  . dexamethasone (DECADRON) injection  4 mg Intravenous Q12H  . diltiazem  300 mg Oral Daily  . feeding supplement  237 mL Oral BID BM  . insulin aspart  0-5 Units Subcutaneous QHS  . insulin aspart  0-9 Units Subcutaneous TID WC  . levothyroxine  100 mcg Oral Q0600  . LORazepam  0.5 mg Oral Once  . mouth rinse  15 mL Mouth Rinse q12n4p  . nutrition supplement (JUVEN)  1 packet Oral BID BM  . polyethylene glycol  17 g Oral BID  . senna-docusate  1 tablet Oral BID  . zinc sulfate  220 mg Oral Daily   Continuous Infusions:   LOS: 9 days    Time spent: 35 mins.More than 50% of that time was spent in counseling and/or coordination of care.      Shelly Coss, MD  [facility] P5/17/2022, 8:22 AM

## 2021-05-13 ENCOUNTER — Inpatient Hospital Stay (HOSPITAL_COMMUNITY): Payer: Medicare Other

## 2021-05-13 ENCOUNTER — Ambulatory Visit
Admit: 2021-05-13 | Discharge: 2021-05-13 | Disposition: A | Discharge status: Home / Self Care | Payer: Medicare Other | Attending: Radiation Oncology | Admitting: Radiation Oncology

## 2021-05-13 DIAGNOSIS — C7951 Secondary malignant neoplasm of bone: Secondary | ICD-10-CM | POA: Diagnosis not present

## 2021-05-13 DIAGNOSIS — C3490 Malignant neoplasm of unspecified part of unspecified bronchus or lung: Secondary | ICD-10-CM | POA: Diagnosis not present

## 2021-05-13 LAB — COMPREHENSIVE METABOLIC PANEL
ALT: 169 U/L — ABNORMAL HIGH (ref 0–44)
AST: 76 U/L — ABNORMAL HIGH (ref 15–41)
Albumin: 2.5 g/dL — ABNORMAL LOW (ref 3.5–5.0)
Alkaline Phosphatase: 105 U/L (ref 38–126)
Anion gap: 10 (ref 5–15)
BUN: 33 mg/dL — ABNORMAL HIGH (ref 8–23)
CO2: 27 mmol/L (ref 22–32)
Calcium: 8.8 mg/dL — ABNORMAL LOW (ref 8.9–10.3)
Chloride: 94 mmol/L — ABNORMAL LOW (ref 98–111)
Creatinine, Ser: 0.71 mg/dL (ref 0.44–1.00)
GFR, Estimated: >60 mL/min (ref >60)
Glucose, Bld: 214 mg/dL — ABNORMAL HIGH (ref 70–99)
Potassium: 4.4 mmol/L (ref 3.5–5.1)
Sodium: 131 mmol/L — ABNORMAL LOW (ref 135–145)
Total Bilirubin: 0.7 mg/dL (ref 0.3–1.2)
Total Protein: 6.6 g/dL (ref 6.5–8.1)

## 2021-05-13 LAB — CBC WITH DIFFERENTIAL/PLATELET
Abs Immature Granulocytes: 0.4 10*3/uL — ABNORMAL HIGH (ref 0.00–0.07)
Basophils Absolute: 0 10*3/uL (ref 0.0–0.1)
Basophils Relative: 0 %
Eosinophils Absolute: 0 10*3/uL (ref 0.0–0.5)
Eosinophils Relative: 0 %
HCT: 32.6 % — ABNORMAL LOW (ref 36.0–46.0)
Hemoglobin: 10.5 g/dL — ABNORMAL LOW (ref 12.0–15.0)
Immature Granulocytes: 2 %
Lymphocytes Relative: 5 %
Lymphs Abs: 0.9 10*3/uL (ref 0.7–4.0)
MCH: 27.4 pg (ref 26.0–34.0)
MCHC: 32.2 g/dL (ref 30.0–36.0)
MCV: 85.1 fL (ref 80.0–100.0)
Monocytes Absolute: 1.1 10*3/uL — ABNORMAL HIGH (ref 0.1–1.0)
Monocytes Relative: 6 %
Neutro Abs: 15.5 10*3/uL — ABNORMAL HIGH (ref 1.7–7.7)
Neutrophils Relative %: 87 %
Platelets: 300 10*3/uL (ref 150–400)
RBC: 3.83 MIL/uL — ABNORMAL LOW (ref 3.87–5.11)
RDW: 16.8 % — ABNORMAL HIGH (ref 11.5–15.5)
WBC: 17.8 10*3/uL — ABNORMAL HIGH (ref 4.0–10.5)
nRBC: 0.1 % (ref 0.0–0.2)

## 2021-05-13 LAB — GLUCOSE, CAPILLARY
Glucose-Capillary: 172 mg/dL — ABNORMAL HIGH (ref 70–99)
Glucose-Capillary: 308 mg/dL — ABNORMAL HIGH (ref 70–99)
Glucose-Capillary: 217 mg/dL — ABNORMAL HIGH (ref 70–99)
Glucose-Capillary: 172 mg/dL — ABNORMAL HIGH (ref 70–99)

## 2021-05-13 MED ORDER — OXYCODONE HCL 5 MG PO TABS
10.0000 mg | ORAL_TABLET | ORAL | Status: DC | PRN
  Administered 2021-05-13 – 2021-05-16 (×7): 10 mg via ORAL
  Filled 2021-05-13 (×8): qty 2

## 2021-05-13 MED ORDER — COLLAGENASE 250 UNIT/GM EX OINT
TOPICAL_OINTMENT | Freq: Every day | CUTANEOUS | Status: DC
  Filled 2021-05-13: qty 30

## 2021-05-13 MED ORDER — DILTIAZEM HCL 60 MG PO TABS
60.0000 mg | ORAL_TABLET | Freq: Two times a day (BID) | ORAL | Status: DC
  Administered 2021-05-13 (×2): 60 mg via ORAL
  Filled 2021-05-13 (×2): qty 1

## 2021-05-13 MED ORDER — GADOBUTROL 1 MMOL/ML IV SOLN
10.0000 mL | Freq: Once | INTRAVENOUS | Status: AC | PRN
  Administered 2021-05-13: 10 mL via INTRAVENOUS

## 2021-05-13 MED ORDER — HYDROMORPHONE HCL 1 MG/ML IJ SOLN
1.0000 mg | INTRAMUSCULAR | Status: DC | PRN
  Administered 2021-05-13 – 2021-05-16 (×9): 1 mg via INTRAVENOUS
  Filled 2021-05-13 (×11): qty 1

## 2021-05-13 MED ORDER — FUROSEMIDE 10 MG/ML IJ SOLN
40.0000 mg | Freq: Two times a day (BID) | INTRAMUSCULAR | Status: DC
  Administered 2021-05-13 – 2021-05-15 (×5): 40 mg via INTRAVENOUS
  Filled 2021-05-13 (×5): qty 4

## 2021-05-13 NOTE — Progress Notes (Signed)
Physical Therapy Treatment Patient Details Name: [name] MRN: [id number] DOB: 16-Apr-1951 Today's Date: 05/13/2021    History of Present Illness Pt is 70 yr old female former smoker with persistent complaints of back pain was in ER on 03/15/21.  She had CT chest then that showed 2.8 x 2.4 cm rounded lesion in Rt upper lobe.  Also noted to have changes of centrilobular emphysema.  She was discharged from ER on levaquin and pain medication.  Positive for COVID 19 on 04/22/21.  She had outpatient treatment with monupiravir.  She presented to hospital again on 05/02/21 with worsening back pain. CT chest imaging from 05/02/21 showed increased mediastinal adenopathy and Rt upper lobe lesion now measuring 5.1 x 3.2 cm.  Pt had bronchoscopy on 05/07/21. MRI: compression fracture of T8 suggestive of pathologic fracture, moderate spinal canal stenosis, metastatic lesions in L2/L4/S1    PT Comments    RN premedicated pt prior to working with PT. Pt agreeable. Mod-Tot A +2 for mobility. Pt was unable to stand on today even with +2 assistance. Assisted pt back to bed end of session. Will continue to follow and progress activity as tolerated.     Follow Up Recommendations  SNF     Equipment Recommendations  None recommended by PT    Recommendations for Other Services       Precautions / Restrictions Precautions Precautions: Fall;Back Restrictions Weight Bearing Restrictions: No    Mobility  Bed Mobility Overal bed mobility: Needs Assistance Bed Mobility: Rolling;Sidelying to Sit;Sit to Supine;Sit to Sidelying Rolling: Mod assist Sidelying to sit: Mod assist;+2 for physical assistance;HOB elevated     Sit to sidelying: Mod assist;+2 for physical assistance;+2 for safety/equipment General bed mobility comments: Increased time. Cues for logroll. Assist for LEs and trunk. Utilized bedpad to assist with movement.    Transfers Overall transfer level: Needs assistance Equipment used: Rolling  walker (2 wheeled) Transfers: Sit to/from Stand Sit to Stand: Total assist;+2 physical assistance;+2 safety/equipment;From elevated surface         General transfer comment: Attempted x 2-pt able to clear surface but unable to stand. Cues for safety, technique, hand placement.  Ambulation/Gait                 Stairs             Wheelchair Mobility    Modified Rankin (Stroke Patients Only)       Balance Overall balance assessment: Needs assistance         Standing balance support: Bilateral upper extremity supported Standing balance-Leahy Scale: Poor                              Cognition Arousal/Alertness: Awake/alert (but drowsy 2* meds) Behavior During Therapy: WFL for tasks assessed/performed Overall Cognitive Status: Within Functional Limits for tasks assessed                                        Exercises      General Comments        Pertinent Vitals/Pain Pain Assessment: 0-10 Pain Score: 7  Pain Location: back Pain Descriptors / Indicators: Grimacing;Guarding;Discomfort;Moaning Pain Intervention(s): Limited activity within patient's tolerance;Monitored during session;Repositioned    Home Living                      Prior  Function            PT Goals (current goals can now be found in the care plan section) Progress towards PT goals: Not progressing toward goals - comment    Frequency    Min 2X/week      PT Plan Current plan remains appropriate    Co-evaluation              AM-PAC PT "6 Clicks" Mobility   Outcome Measure  Help needed turning from your back to your side while in a flat bed without using bedrails?: Total Help needed moving from lying on your back to sitting on the side of a flat bed without using bedrails?: Total Help needed moving to and from a bed to a chair (including a wheelchair)?: Total Help needed standing up from a chair using your arms (e.g., wheelchair  or bedside chair)?: Total Help needed to walk in hospital room?: Total Help needed climbing 3-5 steps with a railing? : Total 6 Click Score: 6    End of Session Equipment Utilized During Treatment: Oxygen Activity Tolerance: Patient limited by fatigue;Patient limited by pain Patient left: in bed;with call bell/phone within reach   PT Visit Diagnosis: Difficulty in walking, not elsewhere classified (R26.2);Muscle weakness (generalized) (M62.81)     Time: 2482-5003 PT Time Calculation (min) (ACUTE ONLY): 22 min  Charges:  $Therapeutic Activity: 8-22 mins                         Doreatha Massed, PT Acute Rehabilitation  Office: [phone] Pager: [phone]

## 2021-05-13 NOTE — Progress Notes (Signed)
PROGRESS NOTE    [name]  JQZ:[id number] DOB: July 06, 1951 DOA: 05/02/2021 PCP: Benay Pike, MD   Chief Complain: Back pain  Brief Narrative: Patient is a 70 year old female with history of chronic A. fib on Eliquis, bladder cancer, hypertension, coronary artery disease, OSA, hypothyroidism, CKD stage IIIa, type of diabetes, recent COVID who presents with complaint of back pain.  Patient was also found to have postobstructive pneumonia due to right upper lobe mass/T8 compression fracture.  Neurosurgery recommended steroids and pain control.  She underwent bronchoscopy/biopsy of the right upper lobe mass on 5/12.  She was started on antibiotic for postobstructive pneumonia, finished the course.. Radiation oncology consulted,started  radiation treatment for T8 compression fracture. Ultimate plan is to discharge to skilled nursing facility, off isolation on 5/17.  Palliative care consulted today.  Assessment & Plan:   Principal Problem:   Lung cancer metastatic to bone [facility]) Active Problems:   Hypothyroidism   Essential hypertension, benign   Atrial fibrillation (HCC)   Diabetes ([place])   Acute-on-chronic kidney injury ([place])   COVID-19 virus infection   Pneumonia due to COVID-19 virus  Acute hypoxic respiratory failure: Secondary to postobstructive pneumonia from right upper lobe mass/covid.  Still requiring 3-4 L of oxygen per minute.  Not on oxygen at baseline.  She may need oxygen on discharge.  Chest x-ray done on 05/11/21 showed right upper lobe mass, no pneumonia or fluid.    She also looked mildly volume overloaded, being  Lasix IV .  BNP was elevated.  Postobstructive pneumonia:Presented with shortness of breath, hypoxic on presentation, saturation was 74% room air.  Finished the course of Unasyn.  Continue as needed bronchodilators, incentive spirometer.  Sputum culture showed normal flora.  Blood cultures negative.  Still requiring 3 to 4 L of oxygen per minute.  We will  continue to try to wean  Right upper lobe mass with metastasis: CT scan showed right upper lobe mass with metastatic mediastinal/hilar lymphadenopathy as well as bony metastasis.  She was recently treated for L4 compression fracture.  CT PE was negative.  The lung mass appeared to have increased in size over few weeks.  Status post bronchoscopy and biopsy on 5/12.  Pathology showed poorly differentiated carcinoma.  PCCM were following.  We have notified  Oncology for follow up as an outpatient.She will also follow up with PCCM on discharge.  T8 compression fracture: As seen on the CT scan.  Likely pathological fracture.  Seen by neurosurgery Dr Annette Stable on 5/9.  Neurosurgery recommended pain management, steroids, no surgical intervention.  Radiation oncology starting  radiation therapy for T8 compression fracture. She initially presented with back pain.  Continue supportive care, pain management.  PT/OT evaluation done, recommended skilled nursing facility on discharge.  COVID-19 infection: First positive test on 4/20,022.  Again positive on 5/7.  She was treated with monupiravir from 4/28-5/3.  CT scan of the chest did not show any worsening infiltrate.  No indication for treatment for now.  She will be off isolation on 05/12/2021.  She is requiring 3-4 L of oxygen per minute.  Permanent A. fib with RVR: She was on oral cardizem.  She was in A. fib with RVR so started on Cardizem drip on 5/17.On  Eliquis for anticoagulation.  Rate is controlled today, drip is stopped and started on oral Cardizem again  Type II diabetes mellitus: Hemoglobin A1C of 6.2.  Continue to monitor.  Continue sliding-scale insulin.  AKI on CKD stage IIIa: Currently kidney function  near baseline.  Baseline creatinine around 1.05.  Elevated LFTs: Previous LFTs were normal.  Could be associated  with COVID infection.  Scanning showed dilated gallbladder without visible gallstones or wall thickening, no pericholecystic fluid,  unremarkable liver.  Hepatitis panel negative.  Statin discontinued.  Monitor.  Left hip unstageable ulcer: Present on admission.  Continue foam dressing.  Wound care consulted.  Hypothyroidism: On Synthyroid  Constipation: Continue bowel regimen  Obesity: BMI of 32.9.  Debility/deconditioning: PT/OT recommending SNF on discharge.  Patient remains extremely weak  Goals of care: Multiple comorbidities, now diagnosed with poorly differentiated lung cancer.  Palliative care consult for goals of care.      Nutrition Problem: Increased nutrient needs Etiology: acute illness,catabolic illness (LOVFI-43 infection)      DVT prophylaxis:Lovenox Code Status: Full Family Communication: Discussed with daughter at bedside on 05/13/21 Status is: Inpatient  Remains inpatient appropriate because:Inpatient level of care appropriate due to severity of illness   Dispo: The patient is from: Home              Anticipated d/c is to: SNF              Patient currently is not medically stable to d/c.   Difficult to place patient No    Consultants: PCCM, radiation oncology, neurosurgery  Procedures: Bronchoscopy/biopsy  Antimicrobials:  Anti-infectives (From admission, onward)   Start     Dose/Rate Route Frequency Ordered Stop   05/10/21 0000  Ampicillin-Sulbactam (UNASYN) 3 g in sodium chloride 0.9 % 100 mL IVPB        3 g 200 mL/hr over 30 Minutes Intravenous Every 6 hours 05/09/21 1652 05/11/21 0027   05/06/21 1200  Ampicillin-Sulbactam (UNASYN) 3 g in sodium chloride 0.9 % 100 mL IVPB  Status:  Discontinued        3 g 200 mL/hr over 30 Minutes Intravenous Every 6 hours 05/06/21 0933 05/09/21 1652   05/03/21 1230  Ampicillin-Sulbactam (UNASYN) 3 g in sodium chloride 0.9 % 100 mL IVPB  Status:  Discontinued        3 g 200 mL/hr over 30 Minutes Intravenous Every 6 hours 05/03/21 1136 05/03/21 1140   05/03/21 1230  cefTRIAXone (ROCEPHIN) 2 g in sodium chloride 0.9 % 100 mL IVPB  Status:   Discontinued        2 g 200 mL/hr over 30 Minutes Intravenous Every 24 hours 05/03/21 1140 05/06/21 0933   05/03/21 1230  azithromycin (ZITHROMAX) tablet 500 mg  Status:  Discontinued        500 mg Oral Daily 05/03/21 1140 05/06/21 0933   05/02/21 1615  piperacillin-tazobactam (ZOSYN) IVPB 3.375 g        3.375 g 100 mL/hr over 30 Minutes Intravenous  Once 05/02/21 1603 05/02/21 1758      Subjective:  Patient seen and examined the bedside this morning.  Still looks little comfortable than yesterday.  She is still coughing.  On 4 L of oxygen per minute.  Still complains of back pain.  Daughter at the bedside.  Objective: Vitals:   05/12/21 1236 05/12/21 1632 05/12/21 2011 05/13/21 0618  BP: 125/73 110/71 116/61 101/78  Pulse: 66 94 81 (!) 101  Resp: (!) 24 18 (!) 22 18  Temp: 98.4 F (36.9 C) (!) 97.5 F (36.4 C) 98.6 F (37 C) 98 F (36.7 C)  TempSrc: Oral Oral Oral Oral  SpO2: 95% 94% 95% 92%  Weight:      Height:  Intake/Output Summary (Last 24 hours) at 05/13/2021 0829 Last data filed at 05/13/2021 0201 Gross per 24 hour  Intake 535.67 ml  Output 2600 ml  Net -2064.33 ml   Filed Weights   05/02/21 1347  Weight: 84.4 kg    Examination:  General exam: Overall comfortable, not in distress, deconditioned, chronically ill looking HEENT: PERRL Respiratory system: Diminished bilateral air entry Cardiovascular system: Irregularly irregular rhythm Gastrointestinal system: Abdomen is nondistended, soft and nontender. Central nervous system: Alert and oriented,weak Extremities: trace bilateral lower extremity edema, no clubbing ,no cyanosis Skin: No rashes, no ulcers,no icterus   Data Reviewed: I have personally reviewed following labs and imaging studies  CBC: Recent Labs  Lab 05/07/21 0350 05/08/21 0417 05/11/21 0408 05/12/21 0851 05/13/21 0346  WBC 14.8* 14.6* 18.4* 21.4* 17.8*  NEUTROABS 13.1* 12.8* 16.2* 18.2* 15.5*  HGB 10.5* 8.8* 10.1* 11.5* 10.5*   HCT 33.4* 28.1* 31.7* 36.5 32.6*  MCV 85.9 86.5 86.1 86.1 85.1  PLT 250 286 317 348 546   Basic Metabolic Panel: Recent Labs  Lab 05/07/21 0350 05/08/21 0417 05/11/21 0408 05/12/21 0851 05/13/21 0346  NA 137 136 135 132* 131*  K 4.2 3.5 4.5 4.5 4.4  CL 100 100 100 95* 94*  CO2 27 29 27 28 27   GLUCOSE 164* 177* 226* 239* 214*  BUN 25* 22 29* 35* 33*  CREATININE 1.10* 0.79 0.69 0.63 0.71  CALCIUM 9.4 9.1 8.6* 8.9 8.8*   GFR: Estimated Creatinine Clearance: 65.4 mL/min (by C-G formula based on SCr of 0.71 mg/dL). Liver Function Tests: Recent Labs  Lab 05/08/21 0417 05/10/21 0258 05/11/21 0408 05/12/21 0851 05/13/21 0346  AST 163* 170* 125* 91* 76*  ALT 190* 261* 227* 196* 169*  ALKPHOS 112 133* 121 119 105  BILITOT 0.3 0.4 0.4 0.6 0.7  PROT 6.4* 6.7 6.4* 7.0 6.6  ALBUMIN 2.2* 2.4* 2.3* 2.5* 2.5*   No results for input(s): LIPASE, AMYLASE in the last 168 hours. No results for input(s): AMMONIA in the last 168 hours. Coagulation Profile: No results for input(s): INR, PROTIME in the last 168 hours. Cardiac Enzymes: Recent Labs  Lab 05/07/21 0350 05/08/21 0417  CKTOTAL 703* 499*   BNP (last 3 results) No results for input(s): PROBNP in the last 8760 hours. HbA1C: No results for input(s): HGBA1C in the last 72 hours. CBG: Recent Labs  Lab 05/12/21 0801 05/12/21 1054 05/12/21 1624 05/12/21 2004 05/13/21 0815  GLUCAP 196* 229* 168* 220* 172*   Lipid Profile: No results for input(s): CHOL, HDL, LDLCALC, TRIG, CHOLHDL, LDLDIRECT in the last 72 hours. Thyroid Function Tests: No results for input(s): TSH, T4TOTAL, FREET4, T3FREE, THYROIDAB in the last 72 hours. Anemia Panel: No results for input(s): VITAMINB12, FOLATE, FERRITIN, TIBC, IRON, RETICCTPCT in the last 72 hours. Sepsis Labs: Recent Labs  Lab 05/07/21 0417 05/07/21 1046  LATICACIDVEN 3.0* 1.7    Recent Results (from the past 240 hour(s))  Expectorated Sputum Assessment w Gram Stain, Rflx to  Resp Cult     Status: None   Collection Time: 05/07/21  6:03 PM   Specimen: Expectorated Sputum  Result Value Ref Range Status   Specimen Description EXPECTORATED SPUTUM  Final   Special Requests NONE  Final   Sputum evaluation   Final    THIS SPECIMEN IS ACCEPTABLE FOR SPUTUM CULTURE Performed at [facility], [place] [address] [place], [place] [address]    Report Status 05/07/2021 FINAL  Final  Culture, Respiratory w Gram Stain  Status: None   Collection Time: 05/07/21  6:03 PM  Result Value Ref Range Status   Specimen Description   Final    EXPECTORATED SPUTUM Performed at [place] [address], [place], [place] [address]    Special Requests   Final    NONE Reflexed from [id number] Performed at [facility], [place] [address] [place], [place] [address]    Gram Stain   Final    MODERATE WBC PRESENT,BOTH PMN AND MONONUCLEAR RARE GRAM POSITIVE COCCI IN PAIRS    Culture   Final    RARE Consistent with normal respiratory flora. No Pseudomonas species isolated Performed at [place] [address] [place], [place] [address]    Report Status 05/10/2021 FINAL  Final         Radiology Studies: DG CHEST PORT 1 VIEW  Result Date: 05/11/2021 CLINICAL DATA:  Shortness of breath, COVID-19 positivity EXAM: PORTABLE CHEST 1 VIEW COMPARISON:  03/15/2021, CT from 05/02/2021 FINDINGS: Cardiac shadow is enlarged in size. Aortic calcifications are again seen. Known right upper lobe mass lesion and right hilar prominence are again noted and stable. Lungs are otherwise clear. No bony abnormality is seen. IMPRESSION: Changes consistent with the known right upper lobe mass lesion. No new acute abnormality is noted. Electronically Signed   By: Inez Catalina M.D.   On: 05/11/2021 16:26        Scheduled Meds: . (feeding supplement) PROSource Plus  30 mL Oral BID BM  . apixaban  5 mg Oral BID  . vitamin C  500 mg Oral Daily   . chlorhexidine  15 mL Mouth Rinse BID  . chlorpheniramine-HYDROcodone  5 mL Oral Q12H  . collagenase   Topical Daily  . dexamethasone (DECADRON) injection  4 mg Intravenous Q12H  . feeding supplement  237 mL Oral BID BM  . guaiFENesin  1,200 mg Oral BID  . insulin aspart  0-5 Units Subcutaneous QHS  . insulin aspart  0-9 Units Subcutaneous TID WC  . levothyroxine  100 mcg Oral Q0600  . LORazepam  0.5 mg Oral Once  . mouth rinse  15 mL Mouth Rinse q12n4p  . nutrition supplement (JUVEN)  1 packet Oral BID BM  . polyethylene glycol  17 g Oral BID  . senna-docusate  1 tablet Oral BID  . zinc sulfate  220 mg Oral Daily   Continuous Infusions: . diltiazem (CARDIZEM) infusion 15 mg/hr (05/13/21 0213)     LOS: 10 days    Time spent: 35 mins.More than 50% of that time was spent in counseling and/or coordination of care.      Shelly Coss, MD [facility] P5/18/2022, 8:29 AM

## 2021-05-13 NOTE — Consult Note (Signed)
WOC follow up on hip wound; discussed with bedside nurse.  Hydrocolloid has performed autolytic debridement of the eschar as desired.  Will change wound care orders for enzymatic debridement and saline dressing daily. OK to remain with this type of dressing until completely clear of necrotic tissue because the debridement ointment is selective.   Discussed POC with bedside nurse.  Re consult if needed, will not follow at this time. Thanks  [name] [employer], RN,CWOCN, CNS, [place] [phone])

## 2021-05-13 NOTE — Plan of Care (Signed)

## 2021-05-14 ENCOUNTER — Ambulatory Visit
Admit: 2021-05-14 | Discharge: 2021-05-14 | Disposition: A | Discharge status: Home / Self Care | Payer: Medicare Other | Attending: Radiation Oncology | Admitting: Radiation Oncology

## 2021-05-14 DIAGNOSIS — N179 Acute kidney failure, unspecified: Secondary | ICD-10-CM | POA: Diagnosis not present

## 2021-05-14 DIAGNOSIS — C7951 Secondary malignant neoplasm of bone: Secondary | ICD-10-CM | POA: Diagnosis not present

## 2021-05-14 DIAGNOSIS — Z515 Encounter for palliative care: Secondary | ICD-10-CM | POA: Diagnosis not present

## 2021-05-14 DIAGNOSIS — C3490 Malignant neoplasm of unspecified part of unspecified bronchus or lung: Secondary | ICD-10-CM | POA: Diagnosis not present

## 2021-05-14 DIAGNOSIS — Z7189 Other specified counseling: Secondary | ICD-10-CM | POA: Diagnosis not present

## 2021-05-14 LAB — CBC WITH DIFFERENTIAL/PLATELET
Abs Immature Granulocytes: 0.34 10*3/uL — ABNORMAL HIGH (ref 0.00–0.07)
Basophils Absolute: 0 10*3/uL (ref 0.0–0.1)
Basophils Relative: 0 %
Eosinophils Absolute: 0 10*3/uL (ref 0.0–0.5)
Eosinophils Relative: 0 %
HCT: 36.5 % (ref 36.0–46.0)
Hemoglobin: 11.6 g/dL — ABNORMAL LOW (ref 12.0–15.0)
Immature Granulocytes: 2 %
Lymphocytes Relative: 4 %
Lymphs Abs: 0.8 10*3/uL (ref 0.7–4.0)
MCH: 27.5 pg (ref 26.0–34.0)
MCHC: 31.8 g/dL (ref 30.0–36.0)
MCV: 86.5 fL (ref 80.0–100.0)
Monocytes Absolute: 1.1 10*3/uL — ABNORMAL HIGH (ref 0.1–1.0)
Monocytes Relative: 5 %
Neutro Abs: 17.1 10*3/uL — ABNORMAL HIGH (ref 1.7–7.7)
Neutrophils Relative %: 89 %
Platelets: 315 10*3/uL (ref 150–400)
RBC: 4.22 MIL/uL (ref 3.87–5.11)
RDW: 16.8 % — ABNORMAL HIGH (ref 11.5–15.5)
WBC: 19.4 10*3/uL — ABNORMAL HIGH (ref 4.0–10.5)
nRBC: 0 % (ref 0.0–0.2)

## 2021-05-14 LAB — GLUCOSE, CAPILLARY
Glucose-Capillary: 238 mg/dL — ABNORMAL HIGH (ref 70–99)
Glucose-Capillary: 218 mg/dL — ABNORMAL HIGH (ref 70–99)
Glucose-Capillary: 192 mg/dL — ABNORMAL HIGH (ref 70–99)
Glucose-Capillary: 254 mg/dL — ABNORMAL HIGH (ref 70–99)

## 2021-05-14 LAB — BASIC METABOLIC PANEL
Anion gap: 10 (ref 5–15)
BUN: 36 mg/dL — ABNORMAL HIGH (ref 8–23)
CO2: 31 mmol/L (ref 22–32)
Calcium: 9 mg/dL (ref 8.9–10.3)
Chloride: 92 mmol/L — ABNORMAL LOW (ref 98–111)
Creatinine, Ser: 0.79 mg/dL (ref 0.44–1.00)
GFR, Estimated: >60 mL/min (ref >60)
Glucose, Bld: 210 mg/dL — ABNORMAL HIGH (ref 70–99)
Potassium: 5.2 mmol/L — ABNORMAL HIGH (ref 3.5–5.1)
Sodium: 133 mmol/L — ABNORMAL LOW (ref 135–145)

## 2021-05-14 MED ORDER — DILTIAZEM HCL-DEXTROSE 125-5 MG/125ML-% IV SOLN (PREMIX)
5.0000 mg/h | INTRAVENOUS | Status: DC
  Filled 2021-05-14: qty 125

## 2021-05-14 MED ORDER — DILTIAZEM HCL-DEXTROSE 125-5 MG/125ML-% IV SOLN (PREMIX)
5.0000 mg/h | INTRAVENOUS | Status: DC
  Administered 2021-05-14: 5 mg/h via INTRAVENOUS
  Administered 2021-05-15: 15 mg/h via INTRAVENOUS
  Filled 2021-05-14 (×3): qty 125

## 2021-05-14 MED ORDER — SODIUM ZIRCONIUM CYCLOSILICATE 10 G PO PACK
10.0000 g | PACK | Freq: Once | ORAL | Status: AC
  Administered 2021-05-14: 10 g via ORAL
  Filled 2021-05-14: qty 1

## 2021-05-14 MED ORDER — DEXAMETHASONE 4 MG PO TABS
4.0000 mg | ORAL_TABLET | Freq: Two times a day (BID) | ORAL | Status: DC
  Administered 2021-05-14 (×2): 4 mg via ORAL
  Filled 2021-05-14 (×3): qty 1

## 2021-05-14 NOTE — NC FL2 (Signed)
[place] LEVEL OF CARE SCREENING TOOL     IDENTIFICATION  Patient Name: [name] Birthdate: 12/08/1951 Sex: female Admission Date (Current Location): 05/02/2021  [facility] and [place] Number:  Herbalist and Address:  [facility],  [place], [place]      Provider Number: [id number]  Attending Physician Name and Address:  Shelly Coss, MD  Relative Name and Phone Number:  Emillee, [place] [phone]  [phone]    Current Level of Care: Hospital Recommended Level of Care: [place] Prior Approval Number:    Date Approved/Denied:   PASRR Number: [id number]  Discharge Plan: SNF    Current Diagnoses: Patient Active Problem List   Diagnosis Date Noted  . Pneumonia due to COVID-19 virus 05/03/2021  . Lung cancer metastatic to bone ([place]) 05/02/2021  . Acute-on-chronic kidney injury ([place]) 05/02/2021  . COVID-19 virus infection 05/02/2021  . Lung mass 03/21/2021  . Chronic midline low back pain without sciatica 02/03/2021  . AKI (acute kidney injury) ([place]) 11/16/2020  . Closed trimalleolar fracture of left ankle 11/09/2020  . Elbow pain, left 08/31/2020  . Conductive hearing loss, bilateral 07/25/2020  . Peripheral neuropathy 06/15/2020  . Chronic kidney disease (CKD), stage III (moderate) ([place]) 12/06/2019  . Dyslipidemia 11/25/2019  . Osteoarthritis of right knee 07/11/2019  . Microalbuminuria 12/07/2018  . OSA (obstructive sleep apnea) 03/16/2018  . History of bladder cancer 08/24/2017  . Claudication ([place]) 07/21/2017  . Onychomycosis of toenail 06/09/2017  . Long-term (current) use of anticoagulants 02/19/2015  . Diabetes ([place]) 04/11/2014  . Atrial fibrillation ([place]) 12/18/2013  . Urinary incontinence 05/06/2011  . HLD (hyperlipidemia) 04/29/2009  . Obesity 04/29/2009  . Essential hypertension, benign 04/29/2009  . Coronary atherosclerosis 04/29/2009  . Hypothyroidism 02/23/2007  .  MYOCARDIAL INFARCTION, OLD 02/23/2007  . Personal history of colonic adenoma 11/11/2004    Orientation RESPIRATION BLADDER Height & Weight     Self,Time,Situation,Place  O2 Incontinent Weight: 84.4 kg Height:  5\' 3"  (160 cm)  BEHAVIORAL SYMPTOMS/MOOD NEUROLOGICAL BOWEL NUTRITION STATUS      Incontinent    AMBULATORY STATUS COMMUNICATION OF NEEDS Skin   Extensive Assist Verbally Other (Comment) (Left Hip Unstageable)                       Personal Care Assistance Level of Assistance  Bathing,Feeding,Dressing Bathing Assistance: Maximum assistance Feeding assistance: Limited assistance Dressing Assistance: Maximum assistance     Functional Limitations Info  Sight,Hearing,Speech Sight Info: Impaired Hearing Info: Adequate Speech Info: Adequate    SPECIAL CARE FACTORS FREQUENCY  PT (By licensed PT),OT (By licensed OT)     PT Frequency: x5 week OT Frequency: x5 week            Contractures Contractures Info: Not present    Additional Factors Info  Code Status,Allergies Code Status Info: FULL Allergies Info: Percocet (Oxycodone-acetaminophen)           Current Medications (05/14/2021):  This is the current hospital active medication list Current Facility-Administered Medications  Medication Dose Route Frequency Provider Last Rate Last Admin  . (feeding supplement) PROSource Plus liquid 30 mL  30 mL Oral BID BM Collene Gobble, MD   30 mL at 05/14/21 1302  . apixaban (ELIQUIS) tablet 5 mg  5 mg Oral BID Florencia Reasons, MD   5 mg at 05/14/21 6195  . ascorbic acid (VITAMIN C) tablet 500 mg  500 mg Oral Daily Collene Gobble,  MD   500 mg at 05/14/21 0910  . chlorhexidine (PERIDEX) 0.12 % solution 15 mL  15 mL Mouth Rinse BID Florencia Reasons, MD   15 mL at 05/14/21 0912  . chlorpheniramine-HYDROcodone (TUSSIONEX) 10-8 MG/5ML suspension 5 mL  5 mL Oral Q12H Shelly Coss, MD   5 mL at 05/14/21 0909  . collagenase (SANTYL) ointment   Topical Daily Shelly Coss, MD   Given at  05/14/21 1017  . dexamethasone (DECADRON) tablet 4 mg  4 mg Oral Q12H Shelly Coss, MD   4 mg at 05/14/21 0910  . diltiazem (CARDIZEM) 125 mg in dextrose 5% 125 mL (1 mg/mL) infusion  5-15 mg/hr Intravenous Titrated Adhikari, Amrit, MD      . feeding supplement (ENSURE ENLIVE / ENSURE PLUS) liquid 237 mL  237 mL Oral BID BM Collene Gobble, MD   237 mL at 05/14/21 1025  . furosemide (LASIX) injection 40 mg  40 mg Intravenous Q12H Shelly Coss, MD   40 mg at 05/14/21 0911  . guaiFENesin (MUCINEX) 12 hr tablet 1,200 mg  1,200 mg Oral BID Shelly Coss, MD   1,200 mg at 05/14/21 0910  . HYDROmorphone (DILAUDID) injection 1 mg  1 mg Intravenous Q4H PRN Shelly Coss, MD   1 mg at 05/14/21 1011  . insulin aspart (novoLOG) injection 0-5 Units  0-5 Units Subcutaneous QHS Collene Gobble, MD   2 Units at 05/12/21 2042  . insulin aspart (novoLOG) injection 0-9 Units  0-9 Units Subcutaneous TID WC Collene Gobble, MD   3 Units at 05/14/21 1303  . levothyroxine (SYNTHROID) tablet 100 mcg  100 mcg Oral Q0600 Collene Gobble, MD   100 mcg at 05/14/21 0524  . MEDLINE mouth rinse  15 mL Mouth Rinse q12n4p Florencia Reasons, MD   15 mL at 05/13/21 1323  . nutrition supplement (JUVEN) (JUVEN) powder packet 1 packet  1 packet Oral BID BM Collene Gobble, MD   1 packet at 05/14/21 0756  . oxyCODONE (Oxy IR/ROXICODONE) immediate release tablet 10 mg  10 mg Oral Q4H PRN Shelly Coss, MD   10 mg at 05/14/21 0758  . phenol (CHLORASEPTIC) mouth spray 1 spray  1 spray Mouth/Throat PRN Florencia Reasons, MD   1 spray at 05/07/21 1755  . polyethylene glycol (MIRALAX / GLYCOLAX) packet 17 g  17 g Oral BID Collene Gobble, MD   17 g at 05/14/21 0912  . senna-docusate (Senokot-S) tablet 1 tablet  1 tablet Oral BID Collene Gobble, MD   1 tablet at 05/14/21 0910  . zinc sulfate capsule 220 mg  220 mg Oral Daily Collene Gobble, MD   220 mg at 05/14/21 0272     Discharge Medications: Please see discharge summary for a list of  discharge medications.  Relevant Imaging Results:  Relevant Lab Results:   Additional Information [id number]  Purcell Mouton, RN

## 2021-05-14 NOTE — Progress Notes (Signed)
   05/14/21 1846  Assess: MEWS Score  Temp 97.7 F (36.5 C)  BP 113/79  Pulse Rate (!) 117  Resp 18  SpO2 96 %  O2 Device Nasal Cannula  O2 Flow Rate (L/min) 3 L/min  Assess: MEWS Score  MEWS Temp 0  MEWS Systolic 0  MEWS Pulse 2  MEWS RR 0  MEWS LOC 0  MEWS Score 2  MEWS Score Color Yellow  Assess: if the MEWS score is Yellow or Red  Were vital signs taken at a resting state? Yes  Focused Assessment Change from prior assessment (see assessment flowsheet)  Does the patient meet 2 or more of the SIRS criteria? No  MEWS guidelines implemented *See Row Information* Yes  Treat  MEWS Interventions Escalated (See documentation below)  Pain Scale 0-10  Pain Score Asleep  Take Vital Signs  Increase Vital Sign Frequency  Yellow: Q 2hr X 2 then Q 4hr X 2, if remains yellow, continue Q 4hrs  Escalate  MEWS: Escalate Yellow: discuss with charge nurse/RN and consider discussing with provider and RRT  Notify: Charge Nurse/RN  Name of Charge Nurse/RN Notified Brien Mates  Date Charge Nurse/RN Notified 05/14/21  Time Charge Nurse/RN Notified 1900  Document  Patient Outcome Other (Comment) (Heart rate decreased after Cardizem gtt increased to 15mg )  Progress note created (see row info) Yes  Assess: SIRS CRITERIA  SIRS Temperature  0  SIRS Pulse 1  SIRS Respirations  0  SIRS WBC 0  SIRS Score Sum  1

## 2021-05-14 NOTE — Progress Notes (Signed)
I called and spoke with the patient's husband. Dr. Lisbeth Renshaw has reviewed her MRI brain and we will repeat this as an outpt in about 2 months to make sure her area of enhancement is truly vascular rather than disease. Her husband is in agreement. She will come down to the department this afternoon for her 2nd radiation treatment to T8.    Carola Rhine, PAC

## 2021-05-14 NOTE — Plan of Care (Signed)
  Problem: Health Behavior/Discharge Planning: Goal: Ability to manage health-related needs will improve Outcome: Progressing   Problem: Clinical Measurements: Goal: Ability to maintain clinical measurements within normal limits will improve Outcome: Progressing Goal: Will remain free from infection Outcome: Progressing Goal: Diagnostic test results will improve Outcome: Progressing Goal: Respiratory complications will improve Outcome: Progressing Goal: Cardiovascular complication will be avoided Outcome: Progressing   Problem: Activity: Goal: Risk for activity intolerance will decrease Outcome: Progressing   Problem: Nutrition: Goal: Adequate nutrition will be maintained Outcome: Progressing   Problem: Coping: Goal: Level of anxiety will decrease Outcome: Progressing   Problem: Elimination: Goal: Will not experience complications related to bowel motility Outcome: Progressing Goal: Will not experience complications related to urinary retention Outcome: Progressing   Problem: Pain Managment: Goal: General experience of comfort will improve Outcome: Progressing   

## 2021-05-14 NOTE — Progress Notes (Signed)
PROGRESS NOTE    [name]  KGM:[id number] DOB: May 04, 1951 DOA: 05/02/2021 PCP: Benay Pike, MD   Chief Complain: Back pain  Brief Narrative: Patient is a 70 year old female with history of chronic A. fib on Eliquis, bladder cancer, hypertension, coronary artery disease, OSA, hypothyroidism, CKD stage IIIa, type of diabetes, recent COVID who presents with complaint of back pain.  Patient was also found to have postobstructive pneumonia due to right upper lobe mass/T8 compression fracture.  Neurosurgery recommended steroids and pain control.  She underwent bronchoscopy/biopsy of the right upper lobe mass on 5/12.  She was started on antibiotic for postobstructive pneumonia, finished the course.. Radiation oncology consulted,started  radiation treatment for T8 compression fracture. Ultimate plan is to discharge to skilled nursing facility, off isolation on 5/17.  Palliative care consulted for goals of care due to poor prognosis.  Assessment & Plan:   Principal Problem:   Lung cancer metastatic to bone [facility]) Active Problems:   Hypothyroidism   Essential hypertension, benign   Atrial fibrillation (HCC)   Diabetes ([place])   Acute-on-chronic kidney injury ([place])   COVID-19 virus infection   Pneumonia due to COVID-19 virus  Acute hypoxic respiratory failure: Secondary to postobstructive pneumonia from right upper lobe mass/covid.  Still requiring 3-4 L of oxygen per minute.  Not on oxygen at baseline.  She may need oxygen on discharge.  Chest x-ray done on 05/11/21 showed right upper lobe mass, no pneumonia or fluid.    She also looked mildly volume overloaded, currently on   Lasix IV .  BNP was elevated.  Postobstructive pneumonia:Presented with shortness of breath, hypoxic on presentation, saturation was 74% room air.  Finished the course of Unasyn.  Continue as needed bronchodilators, incentive spirometer.  Sputum culture showed normal flora.  Blood cultures negative.  On 3L of oxygen per  minute today.  We will continue to try to wean  Right upper lobe mass with metastasis: CT scan showed right upper lobe mass with metastatic mediastinal/hilar lymphadenopathy as well as bony metastasis.  She was recently treated for L4 compression fracture.  CT PE was negative.  The lung mass appeared to have increased in size over few weeks.  Status post bronchoscopy and biopsy on 5/12.  Pathology showed poorly differentiated carcinoma.  PCCM were following.  We have notified  Oncology for follow up as an outpatient.She will also follow up with PCCM on discharge.  T8 compression fracture: As seen on the CT scan.  Likely pathological fracture.  Seen by neurosurgery Dr Annette Stable on 5/9.  Neurosurgery recommended pain management, steroids, no surgical intervention.  Radiation oncology started  radiation therapy for T8 compression fracture. She initially presented with back pain.  Continue supportive care, pain management.  PT/OT evaluation done, recommended skilled nursing facility on discharge.  COVID-19 infection: First positive test on 4/20,022.  Again positive on 5/7.  She was treated with monupiravir from 4/28-5/3.  CT scan of the chest did not show any worsening infiltrate.  No indication for treatment for now.  She will be off isolation on 05/12/2021.  She is requiring 3 L of oxygen per minute.  Permanent A. fib with RVR: She was on oral cardizem.  She was in A. fib with RVR so started on Cardizem drip,now again on oral cardizem.On  Eliquis for anticoagulation.    Type II diabetes mellitus: Hemoglobin A1C of 6.2.  Continue to monitor.  Continue sliding-scale insulin.  AKI on CKD stage IIIa: Currently kidney function near baseline.  Baseline  creatinine around 1.05.  Elevated LFTs: Previous LFTs were normal.  Could be associated  with COVID infection.  Scanning showed dilated gallbladder without visible gallstones or wall thickening, no pericholecystic fluid, unremarkable liver.  Hepatitis panel negative.   Statin discontinued.  Monitor.  Left hip unstageable ulcer: Present on admission.  Continue foam dressing.  Wound care consulted and following  Hypothyroidism: On Synthyroid  Constipation: Continue bowel regimen  Obesity: BMI of 32.9.  Debility/deconditioning: PT/OT recommending SNF on discharge.  Patient remains extremely weak  Goals of care: Multiple comorbidities, now diagnosed with poorly differentiated lung cancer.  Palliative care consult for goals of care.      Nutrition Problem: Increased nutrient needs Etiology: acute illness,catabolic illness (PNTIR-44 infection)      DVT prophylaxis:Lovenox Code Status: Full Family Communication: Discussed with husband at bedside on 05/14/21 Status is: Inpatient  Remains inpatient appropriate because:Inpatient level of care appropriate due to severity of illness   Dispo: The patient is from: Home              Anticipated d/c is to: SNF              Patient currently is not medically stable to d/c.   Difficult to place patient No    Consultants: PCCM, radiation oncology, neurosurgery  Procedures: Bronchoscopy/biopsy  Antimicrobials:  Anti-infectives (From admission, onward)   Start     Dose/Rate Route Frequency Ordered Stop   05/10/21 0000  Ampicillin-Sulbactam (UNASYN) 3 g in sodium chloride 0.9 % 100 mL IVPB        3 g 200 mL/hr over 30 Minutes Intravenous Every 6 hours 05/09/21 1652 05/11/21 0027   05/06/21 1200  Ampicillin-Sulbactam (UNASYN) 3 g in sodium chloride 0.9 % 100 mL IVPB  Status:  Discontinued        3 g 200 mL/hr over 30 Minutes Intravenous Every 6 hours 05/06/21 0933 05/09/21 1652   05/03/21 1230  Ampicillin-Sulbactam (UNASYN) 3 g in sodium chloride 0.9 % 100 mL IVPB  Status:  Discontinued        3 g 200 mL/hr over 30 Minutes Intravenous Every 6 hours 05/03/21 1136 05/03/21 1140   05/03/21 1230  cefTRIAXone (ROCEPHIN) 2 g in sodium chloride 0.9 % 100 mL IVPB  Status:  Discontinued        2 g 200  mL/hr over 30 Minutes Intravenous Every 24 hours 05/03/21 1140 05/06/21 0933   05/03/21 1230  azithromycin (ZITHROMAX) tablet 500 mg  Status:  Discontinued        500 mg Oral Daily 05/03/21 1140 05/06/21 0933   05/02/21 1615  piperacillin-tazobactam (ZOSYN) IVPB 3.375 g        3.375 g 100 mL/hr over 30 Minutes Intravenous  Once 05/02/21 1603 05/02/21 1758      Subjective:  Patient seen and examined the bedside this morning. Discharge.  She went briefly into A. fib with RVR this morning but heart rate in the range of low 100s.  Her breathing looks better today.  She feels better and her back pain is also improving.  She is on 3 L of oxygen per minute.  Objective: Vitals:   05/13/21 0618 05/13/21 1228 05/13/21 2106 05/14/21 0532  BP: 101/78 115/79 113/78 117/72  Pulse: (!) 101 98 (!) 58 (!) 57  Resp: 18 19 20 20   Temp: 98 F (36.7 C) 98.2 F (36.8 C) 97.6 F (36.4 C) 97.6 F (36.4 C)  TempSrc: Oral Oral Oral Oral  SpO2: 92% 98% 97%  97%  Weight:      Height:        Intake/Output Summary (Last 24 hours) at 05/14/2021 0821 Last data filed at 05/14/2021 0400 Gross per 24 hour  Intake --  Output 1750 ml  Net -1750 ml   Filed Weights   05/02/21 1347  Weight: 84.4 kg    Examination:   General exam: Overall comfortable, chronically ill looking, very deconditioned, weak HEENT: PERRL Respiratory system: Bilateral diminished air sounds, no wheezes or crackles  Cardiovascular system: Irregularly irregular rhythm.  Gastrointestinal system: Abdomen is nondistended, soft and nontender. Central nervous system: Alert and oriented Extremities: Trace bilateral lower extremity edema, no clubbing ,no cyanosis Skin: pressure ulcer as above  Data Reviewed: I have personally reviewed following labs and imaging studies  CBC: Recent Labs  Lab 05/08/21 0417 05/11/21 0408 05/12/21 0851 05/13/21 0346 05/14/21 0346  WBC 14.6* 18.4* 21.4* 17.8* 19.4*  NEUTROABS 12.8* 16.2* 18.2* 15.5*  17.1*  HGB 8.8* 10.1* 11.5* 10.5* 11.6*  HCT 28.1* 31.7* 36.5 32.6* 36.5  MCV 86.5 86.1 86.1 85.1 86.5  PLT 286 317 348 300 423   Basic Metabolic Panel: Recent Labs  Lab 05/08/21 0417 05/11/21 0408 05/12/21 0851 05/13/21 0346 05/14/21 0346  NA 136 135 132* 131* 133*  K 3.5 4.5 4.5 4.4 5.2*  CL 100 100 95* 94* 92*  CO2 29 27 28 27 31   GLUCOSE 177* 226* 239* 214* 210*  BUN 22 29* 35* 33* 36*  CREATININE 0.79 0.69 0.63 0.71 0.79  CALCIUM 9.1 8.6* 8.9 8.8* 9.0   GFR: Estimated Creatinine Clearance: 65.4 mL/min (by C-G formula based on SCr of 0.79 mg/dL). Liver Function Tests: Recent Labs  Lab 05/08/21 0417 05/10/21 0258 05/11/21 0408 05/12/21 0851 05/13/21 0346  AST 163* 170* 125* 91* 76*  ALT 190* 261* 227* 196* 169*  ALKPHOS 112 133* 121 119 105  BILITOT 0.3 0.4 0.4 0.6 0.7  PROT 6.4* 6.7 6.4* 7.0 6.6  ALBUMIN 2.2* 2.4* 2.3* 2.5* 2.5*   No results for input(s): LIPASE, AMYLASE in the last 168 hours. No results for input(s): AMMONIA in the last 168 hours. Coagulation Profile: No results for input(s): INR, PROTIME in the last 168 hours. Cardiac Enzymes: Recent Labs  Lab 05/08/21 0417  CKTOTAL 499*   BNP (last 3 results) No results for input(s): PROBNP in the last 8760 hours. HbA1C: No results for input(s): HGBA1C in the last 72 hours. CBG: Recent Labs  Lab 05/13/21 0815 05/13/21 1212 05/13/21 1552 05/13/21 2108 05/14/21 0759  GLUCAP 172* 308* 217* 172* 238*   Lipid Profile: No results for input(s): CHOL, HDL, LDLCALC, TRIG, CHOLHDL, LDLDIRECT in the last 72 hours. Thyroid Function Tests: No results for input(s): TSH, T4TOTAL, FREET4, T3FREE, THYROIDAB in the last 72 hours. Anemia Panel: No results for input(s): VITAMINB12, FOLATE, FERRITIN, TIBC, IRON, RETICCTPCT in the last 72 hours. Sepsis Labs: Recent Labs  Lab 05/07/21 1046  LATICACIDVEN 1.7    Recent Results (from the past 240 hour(s))  Expectorated Sputum Assessment w Gram Stain, Rflx to  Resp Cult     Status: None   Collection Time: 05/07/21  6:03 PM   Specimen: Expectorated Sputum  Result Value Ref Range Status   Specimen Description EXPECTORATED SPUTUM  Final   Special Requests NONE  Final   Sputum evaluation   Final    THIS SPECIMEN IS ACCEPTABLE FOR SPUTUM CULTURE Performed at [facility], [place] [address], [place], [place] [address]    Report Status  05/07/2021 FINAL  Final  Culture, Respiratory w Gram Stain     Status: None   Collection Time: 05/07/21  6:03 PM  Result Value Ref Range Status   Specimen Description   Final    EXPECTORATED SPUTUM Performed at [facility], [place] [address], [place], [place] [address]    Special Requests   Final    NONE Reflexed from [id number] Performed at [facility], [place] [address], [place], [place] [address]    Gram Stain   Final    MODERATE WBC PRESENT,BOTH PMN AND MONONUCLEAR RARE GRAM POSITIVE COCCI IN PAIRS    Culture   Final    RARE Consistent with normal respiratory flora. No Pseudomonas species isolated Performed at [place] [address], [place], [place] [address]    Report Status 05/10/2021 FINAL  Final         Radiology Studies: MR BRAIN W WO CONTRAST  Result Date: 05/13/2021 CLINICAL DATA:  Non-small cell lung carcinoma staging. EXAM: MRI HEAD WITHOUT AND WITH CONTRAST TECHNIQUE: Multiplanar, multiecho pulse sequences of the brain and surrounding structures were obtained without and with intravenous contrast. CONTRAST:  41mL GADAVIST GADOBUTROL 1 MMOL/ML IV SOLN COMPARISON:  None. FINDINGS: Motion degraded examination. Brain: No acute infarct, mass effect or extra-axial collection. No acute or chronic hemorrhage. There is multifocal hyperintense T2-weighted signal within the white matter. Generalized volume loss without a clear lobar predilection. The midline structures are normal. Punctate focus of contrast enhancement at the left insula (18:10).  Vascular: Major flow voids are preserved. Skull and upper cervical spine: Normal calvarium and skull base. Visualized upper cervical spine and soft tissues are normal. Sinuses/Orbits:No paranasal sinus fluid levels or advanced mucosal thickening. No mastoid or middle ear effusion. Normal orbits. IMPRESSION: 1. Motion degraded study. 2. Punctate focus of contrast enhancement at the left insula, which may be vascular, though a small metastatic lesion would be difficult to exclude. Attention on follow-up. Electronically Signed   By: Ulyses Jarred M.D.   On: 05/13/2021 22:49        Scheduled Meds: . (feeding supplement) PROSource Plus  30 mL Oral BID BM  . apixaban  5 mg Oral BID  . vitamin C  500 mg Oral Daily  . chlorhexidine  15 mL Mouth Rinse BID  . chlorpheniramine-HYDROcodone  5 mL Oral Q12H  . collagenase   Topical Daily  . dexamethasone  4 mg Oral Q12H  . feeding supplement  237 mL Oral BID BM  . furosemide  40 mg Intravenous Q12H  . guaiFENesin  1,200 mg Oral BID  . insulin aspart  0-5 Units Subcutaneous QHS  . insulin aspart  0-9 Units Subcutaneous TID WC  . levothyroxine  100 mcg Oral Q0600  . mouth rinse  15 mL Mouth Rinse q12n4p  . nutrition supplement (JUVEN)  1 packet Oral BID BM  . polyethylene glycol  17 g Oral BID  . senna-docusate  1 tablet Oral BID  . sodium zirconium cyclosilicate  10 g Oral Once  . zinc sulfate  220 mg Oral Daily   Continuous Infusions: . diltiazem (CARDIZEM) infusion       LOS: 11 days    Time spent: 35 mins.More than 50% of that time was spent in counseling and/or coordination of care.      Shelly Coss, MD [facility] P5/19/2022, 8:21 AM

## 2021-05-14 NOTE — Care Management Important Message (Signed)
Important Message  Patient Details IM Letter given to the Patient. Name: [name] MRN: [id number] Date of Birth: Apr 14, 1951   Medicare Important Message Given:  Yes     Kerin Salen 05/14/2021, 10:48 AM

## 2021-05-14 NOTE — TOC Progression Note (Signed)
Transition of Care [facility]) - Progression Note    Patient Details  Name: [name] MRN: [id number] Date of Birth: 1951-07-13  Transition of Care [facility]) CM/SW Contact  Purcell Mouton, RN Phone Number: 05/14/2021, 4:10 PM  Clinical Narrative:     Spoke with pt and husband at bedside concerning discharge. Both agreed to SNF/Rehab. Pt was fax out to SNF.   Expected Discharge Plan: [place] (vs SNF) Barriers to Discharge: No Barriers Identified  Expected Discharge Plan and Services Expected Discharge Plan: [place] (vs SNF)       Living arrangements for the past 2 months: Single Family Home                                       Social Determinants of Health (SDOH) Interventions    Readmission Risk Interventions Readmission Risk Prevention Plan 11/10/2020  Transportation Screening Complete  PCP or Specialist Appt within 5-7 Days Complete  Home Care Screening Complete  Medication Review (RN CM) Complete  Some recent data might be hidden

## 2021-05-15 ENCOUNTER — Ambulatory Visit
Admit: 2021-05-15 | Discharge: 2021-05-15 | Disposition: A | Discharge status: Home / Self Care | Payer: Medicare Other | Attending: Radiation Oncology | Admitting: Radiation Oncology

## 2021-05-15 DIAGNOSIS — C7951 Secondary malignant neoplasm of bone: Secondary | ICD-10-CM | POA: Diagnosis not present

## 2021-05-15 DIAGNOSIS — C3490 Malignant neoplasm of unspecified part of unspecified bronchus or lung: Secondary | ICD-10-CM | POA: Diagnosis not present

## 2021-05-15 LAB — CBC WITH DIFFERENTIAL/PLATELET
Abs Immature Granulocytes: 0.32 10*3/uL — ABNORMAL HIGH (ref 0.00–0.07)
Basophils Absolute: 0 10*3/uL (ref 0.0–0.1)
Basophils Relative: 0 %
Eosinophils Absolute: 0 10*3/uL (ref 0.0–0.5)
Eosinophils Relative: 0 %
HCT: 35.2 % — ABNORMAL LOW (ref 36.0–46.0)
Hemoglobin: 11.2 g/dL — ABNORMAL LOW (ref 12.0–15.0)
Immature Granulocytes: 2 %
Lymphocytes Relative: 3 %
Lymphs Abs: 0.7 10*3/uL (ref 0.7–4.0)
MCH: 27.1 pg (ref 26.0–34.0)
MCHC: 31.8 g/dL (ref 30.0–36.0)
MCV: 85 fL (ref 80.0–100.0)
Monocytes Absolute: 1.7 10*3/uL — ABNORMAL HIGH (ref 0.1–1.0)
Monocytes Relative: 8 %
Neutro Abs: 18.7 10*3/uL — ABNORMAL HIGH (ref 1.7–7.7)
Neutrophils Relative %: 87 %
Platelets: 292 10*3/uL (ref 150–400)
RBC: 4.14 MIL/uL (ref 3.87–5.11)
RDW: 16.7 % — ABNORMAL HIGH (ref 11.5–15.5)
WBC: 21.4 10*3/uL — ABNORMAL HIGH (ref 4.0–10.5)
nRBC: 0.1 % (ref 0.0–0.2)

## 2021-05-15 LAB — BASIC METABOLIC PANEL
Anion gap: 9 (ref 5–15)
BUN: 51 mg/dL — ABNORMAL HIGH (ref 8–23)
CO2: 34 mmol/L — ABNORMAL HIGH (ref 22–32)
Calcium: 8.7 mg/dL — ABNORMAL LOW (ref 8.9–10.3)
Chloride: 90 mmol/L — ABNORMAL LOW (ref 98–111)
Creatinine, Ser: 0.73 mg/dL (ref 0.44–1.00)
GFR, Estimated: >60 mL/min (ref >60)
Glucose, Bld: 238 mg/dL — ABNORMAL HIGH (ref 70–99)
Potassium: 3.6 mmol/L (ref 3.5–5.1)
Sodium: 133 mmol/L — ABNORMAL LOW (ref 135–145)

## 2021-05-15 LAB — GLUCOSE, CAPILLARY
Glucose-Capillary: 190 mg/dL — ABNORMAL HIGH (ref 70–99)
Glucose-Capillary: 227 mg/dL — ABNORMAL HIGH (ref 70–99)
Glucose-Capillary: 210 mg/dL — ABNORMAL HIGH (ref 70–99)

## 2021-05-15 MED ORDER — DILTIAZEM HCL-DEXTROSE 125-5 MG/125ML-% IV SOLN (PREMIX)
5.0000 mg/h | INTRAVENOUS | Status: DC
  Administered 2021-05-15: 5 mg/h via INTRAVENOUS
  Administered 2021-05-16: 12.5 mg/h via INTRAVENOUS
  Filled 2021-05-15 (×3): qty 125

## 2021-05-15 MED ORDER — DEXAMETHASONE 4 MG PO TABS
4.0000 mg | ORAL_TABLET | Freq: Every day | ORAL | Status: AC
  Administered 2021-05-15: 4 mg via ORAL

## 2021-05-15 MED ORDER — DILTIAZEM HCL 60 MG PO TABS
60.0000 mg | ORAL_TABLET | Freq: Three times a day (TID) | ORAL | Status: DC
  Administered 2021-05-15 (×2): 60 mg via ORAL
  Filled 2021-05-15 (×2): qty 1

## 2021-05-15 NOTE — Progress Notes (Addendum)
Nutrition Follow-up  DOCUMENTATION CODES:   Not applicable  INTERVENTION:  - continue Ensure Enlive BID, 30 ml Prosource Plus BID, and 1 packet Juven BID.  - family to continue to bring in food for meals.    NUTRITION DIAGNOSIS:   Increased nutrient needs related to acute illness,catabolic illness (QAESL-75 infection) as evidenced by estimated needs. -ongoing  GOAL:   Patient will meet greater than or equal to 90% of their needs -met  MONITOR:   PO intake,Supplement acceptance,Labs,Weight trends  ASSESSMENT:   70 y.o. female with medical history of afib on Eliquis, bladder cancer, HTN, CAD, OSA, hypothyroidism, stage 3 CKD, type 2 DM, and recent COVID infection (04/22/21) who presents with worsening back pain. She has been seen in the ED several times in the past 2 months for worsening back pain and has been treated with steroids and opioids for pain management.  Patient sleeping through entirety of RD visit. Husband at bedside and provides all information. Patient mainly does not care for the food provided by the hospital so he has been bringing in foods she enjoys for 2-3 meals/day. He reports she has been consistently consuming oral nutrition supplements and has no complaint about them. Emphasized the importance of these supplements for wound healing.   Per flow sheet documentation, she ate 50% of breakfast, 20% of lunch, and 25% of dinner on 5/17; 45% of breakfast on 5/18. She had taken a few bites of grits and egg from breakfast tray this AM.   Per review of orders, she has been accepting Ensure Enlive 90% of the time offered, Prosource Plus 100% of the time offered, and Juven 75% of the time offered.   She has not been weighed since 5/7. Patient noted to be on lasix BID.   Husband reports plan is for d/c to rehab and that MD had informed him that this may happen in the coming day or two.    Labs reviewed; CBG: 190 mg/dl, Na: 133 mmol/l, Cl: 90 mmol/l, BUN: 51 mg/dl, Ca: 8.7  mg/dl.  Medications reviewed; 500 mg ascorbic acid/day, 40 mg IV lasix BID, sliding scale novolog, 100 mcg oral synthroid/day, 17 g miralax BID, 1 tablet senokot BID, 10 g lokelma x1 dose 5/19, 220 mg zinc sulfate/day.     NUTRITION - FOCUSED PHYSICAL EXAM:  completed; no muscle or fat depletions, mild edema to BLE.   Diet Order:   Diet Order            Diet heart healthy/carb modified Room service appropriate? Yes with Assist; Fluid consistency: Thin  Diet effective now                 EDUCATION NEEDS:   No education needs have been identified at this time  Skin:  Skin Assessment: Skin Integrity Issues: Skin Integrity Issues:: Unstageable Unstageable: full thickness to L buttocks  Last BM:  5/19 (type 6 x1)  Height:   Ht Readings from Last 1 Encounters:  05/02/21 _0  (1.6 m)    Weight:   Wt Readings from Last 1 Encounters:  05/02/21 84.4 kg     Estimated Nutritional Needs:  Kcal:  2000-2200 kcal Protein:  100-115 grams Fluid:  >/= 2.2 L/day       Jarome Matin, MS, RD, LDN, CNSC Inpatient Clinical Dietitian RD pager # available in [facility]  After hours/weekend pager # available in [facility]

## 2021-05-15 NOTE — Consult Note (Signed)
Palliative Care Consult Note  Palliative care consult received.  Chart reviewed including personal review of pertinent labs and imaging.  I met today with [name].  I introduced palliative care as specialized medical care for people living with serious illness. It focuses on providing relief from the symptoms and stress of a serious illness. The goal is to improve quality of life for both the patient and the family.  She was awake and alert in bed.  She reported having some continued pain in her back.  Reports that it is generally worse when she returns from radiation.  We discussed clinical course as well as wishes moving forward in regard to advanced directives.  Concepts specific to code status and care plan this hospitalization discussed.  We discussed difference between a aggressive medical intervention path and a palliative, comfort focused care path.  Values and goals of care important to patient and family were attempted to be elicited.  We reviewed her disease and she expressed that she is still trying to process the fact that she has metastatic cancer.  She notes that she still has not been evaluated by an oncologist (plan is for OP follow-up) and she needs to see what are her options for treatment before making any decisions on long term care plan.    Questions and concerns addressed.   PMT will continue to support holistically.  - Full code/Full Scope - Discussed today that she has incurable disease and therapies moving forward would be palliative in nature.  She seems to still be working to process this and downplays seriousness of her condition. - She still has not been evaluated by medical oncologist.  She reports needing to have this opportunity as she is invested in plan to pursue any offered interventions. - States "of course" she would want heroic/aggressive care moving forward.  I doubt this will change until she has more time to process and meet further with oncology to  discuss options for care and specifics of her newly diagnosed cancer. - States her husband would be her surrogate decision maker in the event she cannot make her own decisions. - No other palliative specific recommendations at this time.  Will plan to check in later this week if she remains inpatient.  Start time: 1610 End time: 1700  Total time: 50 minutes  Micheline Rough, MD [place] Team [phone]

## 2021-05-15 NOTE — Progress Notes (Signed)
Physical Therapy Treatment Patient Details Name: [name] MRN: [id number] DOB: 1951/04/27 Today's Date: 05/15/2021    History of Present Illness Pt is 70 yr old female former smoker with persistent complaints of back pain was in ER on 03/15/21.  She had CT chest then that showed 2.8 x 2.4 cm rounded lesion in Rt upper lobe.  Also noted to have changes of centrilobular emphysema.  She was discharged from ER on levaquin and pain medication.  Positive for COVID 19 on 04/22/21.  She had outpatient treatment with monupiravir.  She presented to hospital again on 05/02/21 with worsening back pain. CT chest imaging from 05/02/21 showed increased mediastinal adenopathy and Rt upper lobe lesion now measuring 5.1 x 3.2 cm.  Pt had bronchoscopy on 05/07/21. MRI: compression fracture of T8 suggestive of pathologic fracture, moderate spinal canal stenosis, metastatic lesions in L2/L4/S1    PT Comments    Pt with limited to no progress due to pain.  She did stand with max x 2 into STEDY but only able to maintain for 5-15 seconds at a time due to pain.  Pt had received pain med prior to session and performed transfer techniques to assist with pain control.  Continue plan of care as pt has had palliative consult and continues to want full/aggressive care at this time.     Follow Up Recommendations  SNF     Equipment Recommendations  Wheelchair cushion (measurements PT);Wheelchair (measurements PT);Hospital bed (hoyer lift; needs further assessment next venue)    Recommendations for Other Services       Precautions / Restrictions Precautions Precautions: Fall;Back    Mobility  Bed Mobility Overal bed mobility: Needs Assistance Bed Mobility: Rolling;Sidelying to Sit;Sit to Supine;Sit to Sidelying Rolling: Mod assist Sidelying to sit: Mod assist;+2 for physical assistance     Sit to sidelying: Mod assist;+2 for physical assistance General bed mobility comments: Increased time. Cues for logroll.  Assist for LEs and trunk. Utilized bedpad to assist with movement.    Transfers Overall transfer level: Needs assistance   Transfers: Sit to/from Stand Sit to Stand: Max assist;+2 physical assistance         General transfer comment: Performed 2 sit to stands into STEDY from elevated bed.  Requiring max A x 2 to stand and cues to tuck buttock/straighten knees.  Too painful to tolerate transfer to chair  Ambulation/Gait                 Stairs             Wheelchair Mobility    Modified Rankin (Stroke Patients Only)       Balance Overall balance assessment: Needs assistance Sitting-balance support: Bilateral upper extremity supported Sitting balance-Leahy Scale: Poor Sitting balance - Comments: Requiring UE support at EOB for balance and pain control   Standing balance support: Bilateral upper extremity supported Standing balance-Leahy Scale: Zero Standing balance comment: requiring UE on STEDY and mod A of 2 to remain standing; only able to stand 5-15 sec due to pain                            Cognition Arousal/Alertness: Awake/alert Behavior During Therapy: WFL for tasks assessed/performed Overall Cognitive Status: Within Functional Limits for tasks assessed  Exercises General Exercises - Lower Extremity Ankle Circles/Pumps: AROM;Both;15 reps;Supine Quad Sets: AROM;Both;10 reps;Supine Short Arc Quad: AAROM;Both;10 reps;Supine Heel Slides: AAROM;Both;10 reps;Supine Hip ABduction/ADduction: AAROM;Both;10 reps;Supine Other Exercises Other Exercises: rest breaks, encouragement for full ROM, R LE less assist than L LE    General Comments General comments (skin integrity, edema, etc.): On 3 L O2 with sats down to 85% during transfers but with cues for pursed lip breathing up to 92% quickly with rest      Pertinent Vitals/Pain Pain Assessment: Faces Faces Pain Scale: Hurts whole lot  (with movement, sleeping at rest) Pain Location: back Pain Descriptors / Indicators: Grimacing;Guarding;Discomfort;Moaning Pain Intervention(s): Limited activity within patient's tolerance;Monitored during session;Premedicated before session;Repositioned (slow transitions, back precautions)    Home Living                      Prior Function            PT Goals (current goals can now be found in the care plan section) Acute Rehab PT Goals PT Goal Formulation: With patient Time For Goal Achievement: 05/18/21 Potential to Achieve Goals: Fair Progress towards PT goals: Not progressing toward goals - comment (pain limited)    Frequency    Min 2X/week      PT Plan Current plan remains appropriate    Co-evaluation              AM-PAC PT "6 Clicks" Mobility   Outcome Measure  Help needed turning from your back to your side while in a flat bed without using bedrails?: Total Help needed moving from lying on your back to sitting on the side of a flat bed without using bedrails?: Total Help needed moving to and from a bed to a chair (including a wheelchair)?: Total Help needed standing up from a chair using your arms (e.g., wheelchair or bedside chair)?: Total Help needed to walk in hospital room?: Total Help needed climbing 3-5 steps with a railing? : Total 6 Click Score: 6    End of Session Equipment Utilized During Treatment: Oxygen;Gait belt Activity Tolerance: Patient limited by fatigue;Patient limited by pain Patient left: in bed;with call bell/phone within reach;with family/visitor present Nurse Communication: Mobility status;Need for lift equipment PT Visit Diagnosis: Difficulty in walking, not elsewhere classified (R26.2);Muscle weakness (generalized) (M62.81)     Time: 1275-1700 PT Time Calculation (min) (ACUTE ONLY): 29 min  Charges:  $Therapeutic Exercise: 8-22 mins $Therapeutic Activity: 8-22 mins                     Abran Richard, PT Acute Rehab  Services Pager [phone] [facility] [place] 05/15/2021, 1:15 PM

## 2021-05-15 NOTE — Progress Notes (Signed)
PROGRESS NOTE    [name]  ATF:[id number] DOB: 11-25-1951 DOA: 05/02/2021 PCP: Benay Pike, MD   Chief Complain: Back pain  Brief Narrative: Patient is a 70 year old female with history of chronic A. fib on Eliquis, bladder cancer, hypertension, coronary artery disease, OSA, hypothyroidism, CKD stage IIIa, type of diabetes, recent COVID who presents with complaint of back pain.  Patient was also found to have postobstructive pneumonia due to right upper lobe mass/T8 compression fracture.  Neurosurgery recommended steroids and pain control.  She underwent bronchoscopy/biopsy of the right upper lobe mass on 5/12.  She was started on antibiotic for postobstructive pneumonia, finished the course.. Radiation oncology consulted,started  radiation treatment for T8 compression fracture. Ultimate plan is to discharge to skilled nursing facility, off isolation on 5/17.  Palliative care consulted for goals of care due to poor prognosis, plan is to continue full scope of treatment.  Waiting for skilled nursing facility placement.  Assessment & Plan:   Principal Problem:   Lung cancer metastatic to bone [facility]) Active Problems:   Hypothyroidism   Essential hypertension, benign   Atrial fibrillation (HCC)   Diabetes ([place])   Acute-on-chronic kidney injury ([place])   COVID-19 virus infection   Pneumonia due to COVID-19 virus  Acute hypoxic respiratory failure: Secondary to postobstructive pneumonia from right upper lobe mass/covid.  Still requiring 3 L of oxygen per minute.  Not on oxygen at baseline.  She may need oxygen on discharge.  Chest x-ray done on 05/11/21 showed right upper lobe mass, no pneumonia or fluid.    She also looked mildly volume overloaded, given few doses of    Lasix IV .  BNP was elevated.  Postobstructive pneumonia:Presented with shortness of breath, hypoxic on presentation, saturation was 74% room air.  Finished the course of Unasyn.  Continue as needed bronchodilators,  incentive spirometer.  Sputum culture showed normal flora.  Blood cultures negative.  On 3L of oxygen per minute today.  We will continue to try to wean  Right upper lobe mass with metastasis: CT scan showed right upper lobe mass with metastatic mediastinal/hilar lymphadenopathy as well as bony metastasis.  She was recently treated for L4 compression fracture.  CT PE was negative.  The lung mass appeared to have increased in size over few weeks.  Status post bronchoscopy and biopsy on 5/12.  Pathology showed poorly differentiated carcinoma.  PCCM were following.  We have notified  Oncology for follow up as an outpatient.She will also follow up with PCCM on discharge. Mri brain showed punctate focus of contrast enhancement at the left insula suggesting  Vascular vs a small metastatic lesion,outpatient follow up planned.  T8 compression fracture: As seen on the CT scan.  Likely pathological fracture.  Seen by neurosurgery Dr Annette Stable on 5/9.  Neurosurgery recommended pain management, steroids, no surgical intervention.  Radiation oncology started  radiation therapy for T8 compression fracture. She initially presented with back pain.  Continue supportive care, pain management.  PT/OT evaluation done, recommended skilled nursing facility on discharge.  On steroid taper.  COVID-19 infection: First positive test on 4/20,022.  Again positive on 5/7.  She was treated with monupiravir from 4/28-5/3.  CT scan of the chest did not show any worsening infiltrate.  No indication for treatment for now.  She is off isolation since 05/12/2021.  She is requiring 3 L of oxygen per minute.  Permanent A. fib with RVR: She was on oral cardizem.  She was in A. fib with RVR so  started on Cardizem drip,now again on oral cardizem.On  Eliquis for anticoagulation.    Type II diabetes mellitus: Hemoglobin A1C of 6.2.  Continue to monitor.  Continue sliding-scale insulin.  AKI on CKD stage IIIa: Currently kidney function near baseline.   Baseline creatinine around 1.05.  Elevated LFTs: Previous LFTs were normal.  Could be associated  with COVID infection.  Scanning showed dilated gallbladder without visible gallstones or wall thickening, no pericholecystic fluid, unremarkable liver.  Hepatitis panel negative.  Statin discontinued.  Monitor.  Left hip unstageable ulcer: Present on admission.  Continue foam dressing.  Wound care consulted and following  Hypothyroidism: On Synthyroid  Constipation: Continue bowel regimen  Obesity: BMI of 32.9.  Debility/deconditioning: PT/OT recommending SNF on discharge.  Patient remains extremely weak  Goals of care: Multiple comorbidities, now diagnosed with poorly differentiated lung cancer.  Palliative care consult for goals of care.  Current plan is to continue full scope of treatment.      Nutrition Problem: Increased nutrient needs Etiology: acute illness,catabolic illness ([id number] infection)      DVT prophylaxis:Lovenox Code Status: Full Family Communication: Discussed with husband/son at bedside on 05/14/21 Status is: Inpatient  Remains inpatient appropriate because:Inpatient level of care appropriate due to severity of illness   Dispo: The patient is from: Home              Anticipated d/c is to: SNF              Patient currently is not medically stable to d/c.   Difficult to place patient No    Consultants: PCCM, radiation oncology, neurosurgery  Procedures: Bronchoscopy/biopsy  Antimicrobials:  Anti-infectives (From admission, onward)   Start     Dose/Rate Route Frequency Ordered Stop   05/10/21 0000  Ampicillin-Sulbactam (UNASYN) 3 g in sodium chloride 0.9 % 100 mL IVPB        3 g 200 mL/hr over 30 Minutes Intravenous Every 6 hours 05/09/21 1652 05/11/21 0027   05/06/21 1200  Ampicillin-Sulbactam (UNASYN) 3 g in sodium chloride 0.9 % 100 mL IVPB  Status:  Discontinued        3 g 200 mL/hr over 30 Minutes Intravenous Every 6 hours 05/06/21 0933 05/09/21  1652   05/03/21 1230  Ampicillin-Sulbactam (UNASYN) 3 g in sodium chloride 0.9 % 100 mL IVPB  Status:  Discontinued        3 g 200 mL/hr over 30 Minutes Intravenous Every 6 hours 05/03/21 1136 05/03/21 1140   05/03/21 1230  cefTRIAXone (ROCEPHIN) 2 g in sodium chloride 0.9 % 100 mL IVPB  Status:  Discontinued        2 g 200 mL/hr over 30 Minutes Intravenous Every 24 hours 05/03/21 1140 05/06/21 0933   05/03/21 1230  azithromycin (ZITHROMAX) tablet 500 mg  Status:  Discontinued        500 mg Oral Daily 05/03/21 1140 05/06/21 0933   05/02/21 1615  piperacillin-tazobactam (ZOSYN) IVPB 3.375 g        3.375 g 100 mL/hr over 30 Minutes Intravenous  Once 05/02/21 1603 05/02/21 1758      Subjective:  Patient seen and examined the bedside this morning.  Hemodynamically stable.  On 3 L of oxygen per minute.  Respiratory status and back pain are stable like yesterday.  No new complaints.  Objective: Vitals:   05/14/21 2118 05/14/21 2220 05/15/21 0256 05/15/21 0703  BP: 116/71 132/65 123/68 120/75  Pulse: (!) 108 (!) 104 97 71  Resp: 14  14  13  Temp: 98.2 F (36.8 C) (!) 97.5 F (36.4 C) 97.8 F (36.6 C) 97.6 F (36.4 C)  TempSrc: Axillary Oral Oral Oral  SpO2: 98% 95% 92% 95%  Weight:      Height:        Intake/Output Summary (Last 24 hours) at 05/15/2021 6237 Last data filed at 05/15/2021 0200 Gross per 24 hour  Intake 532.17 ml  Output 250 ml  Net 282.17 ml   Filed Weights   05/02/21 1347  Weight: 84.4 kg    Examination:   General exam: Very deconditioned, debilitated, chronically ill looking HEENT: PERRL Respiratory system: Bilateral diminished air sounds, no wheezes or crackles  Cardiovascular system: Irregularly irregular rhythm Gastrointestinal system: Abdomen is nondistended, soft and nontender. Central nervous system: Alert and oriented Extremities: No edema, no clubbing ,no cyanosis Skin: Pressure ulcers as above.  Data Reviewed: I have personally reviewed  following labs and imaging studies  CBC: Recent Labs  Lab 05/11/21 0408 05/12/21 0851 05/13/21 0346 05/14/21 0346 05/15/21 0346  WBC 18.4* 21.4* 17.8* 19.4* 21.4*  NEUTROABS 16.2* 18.2* 15.5* 17.1* 18.7*  HGB 10.1* 11.5* 10.5* 11.6* 11.2*  HCT 31.7* 36.5 32.6* 36.5 35.2*  MCV 86.1 86.1 85.1 86.5 85.0  PLT 317 348 300 315 628   Basic Metabolic Panel: Recent Labs  Lab 05/11/21 0408 05/12/21 0851 05/13/21 0346 05/14/21 0346 05/15/21 0346  NA 135 132* 131* 133* 133*  K 4.5 4.5 4.4 5.2* 3.6  CL 100 95* 94* 92* 90*  CO2 27 28 27 31  34*  GLUCOSE 226* 239* 214* 210* 238*  BUN 29* 35* 33* 36* 51*  CREATININE 0.69 0.63 0.71 0.79 0.73  CALCIUM 8.6* 8.9 8.8* 9.0 8.7*   GFR: Estimated Creatinine Clearance: 65.4 mL/min (by C-G formula based on SCr of 0.73 mg/dL). Liver Function Tests: Recent Labs  Lab 05/10/21 0258 05/11/21 0408 05/12/21 0851 05/13/21 0346  AST 170* 125* 91* 76*  ALT 261* 227* 196* 169*  ALKPHOS 133* 121 119 105  BILITOT 0.4 0.4 0.6 0.7  PROT 6.7 6.4* 7.0 6.6  ALBUMIN 2.4* 2.3* 2.5* 2.5*   No results for input(s): LIPASE, AMYLASE in the last 168 hours. No results for input(s): AMMONIA in the last 168 hours. Coagulation Profile: No results for input(s): INR, PROTIME in the last 168 hours. Cardiac Enzymes: No results for input(s): CKTOTAL, CKMB, CKMBINDEX, TROPONINI in the last 168 hours. BNP (last 3 results) No results for input(s): PROBNP in the last 8760 hours. HbA1C: No results for input(s): HGBA1C in the last 72 hours. CBG: Recent Labs  Lab 05/14/21 0759 05/14/21 1232 05/14/21 1736 05/14/21 2116 05/15/21 0723  GLUCAP 238* 218* 192* 254* 190*   Lipid Profile: No results for input(s): CHOL, HDL, LDLCALC, TRIG, CHOLHDL, LDLDIRECT in the last 72 hours. Thyroid Function Tests: No results for input(s): TSH, T4TOTAL, FREET4, T3FREE, THYROIDAB in the last 72 hours. Anemia Panel: No results for input(s): VITAMINB12, FOLATE, FERRITIN, TIBC, IRON,  RETICCTPCT in the last 72 hours. Sepsis Labs: No results for input(s): PROCALCITON, LATICACIDVEN in the last 168 hours.  Recent Results (from the past 240 hour(s))  Expectorated Sputum Assessment w Gram Stain, Rflx to Resp Cult     Status: None   Collection Time: 05/07/21  6:03 PM   Specimen: Expectorated Sputum  Result Value Ref Range Status   Specimen Description EXPECTORATED SPUTUM  Final   Special Requests NONE  Final   Sputum evaluation   Final    THIS SPECIMEN IS ACCEPTABLE FOR  SPUTUM CULTURE Performed at [facility], [place] [address], [place], [place] [address]    Report Status 05/07/2021 FINAL  Final  Culture, Respiratory w Gram Stain     Status: None   Collection Time: 05/07/21  6:03 PM  Result Value Ref Range Status   Specimen Description   Final    EXPECTORATED SPUTUM Performed at [place] [address], [place], [address] [address]    Special Requests   Final    NONE Reflexed from [id number] Performed at [facility], [place] [address], [place], [place] [address]    Gram Stain   Final    MODERATE WBC PRESENT,BOTH PMN AND MONONUCLEAR RARE GRAM POSITIVE COCCI IN PAIRS    Culture   Final    RARE Consistent with normal respiratory flora. No Pseudomonas species isolated Performed at [facility] [address], [place], [place] [address]    Report Status 05/10/2021 FINAL  Final         Radiology Studies: MR BRAIN W WO CONTRAST  Result Date: 05/13/2021 CLINICAL DATA:  Non-small cell lung carcinoma staging. EXAM: MRI HEAD WITHOUT AND WITH CONTRAST TECHNIQUE: Multiplanar, multiecho pulse sequences of the brain and surrounding structures were obtained without and with intravenous contrast. CONTRAST:  66mL GADAVIST GADOBUTROL 1 MMOL/ML IV SOLN COMPARISON:  None. FINDINGS: Motion degraded examination. Brain: No acute infarct, mass effect or extra-axial collection. No acute or chronic hemorrhage. There is  multifocal hyperintense T2-weighted signal within the white matter. Generalized volume loss without a clear lobar predilection. The midline structures are normal. Punctate focus of contrast enhancement at the left insula (18:10). Vascular: Major flow voids are preserved. Skull and upper cervical spine: Normal calvarium and skull base. Visualized upper cervical spine and soft tissues are normal. Sinuses/Orbits:No paranasal sinus fluid levels or advanced mucosal thickening. No mastoid or middle ear effusion. Normal orbits. IMPRESSION: 1. Motion degraded study. 2. Punctate focus of contrast enhancement at the left insula, which may be vascular, though a small metastatic lesion would be difficult to exclude. Attention on follow-up. Electronically Signed   By: Ulyses Jarred M.D.   On: 05/13/2021 22:49        Scheduled Meds: . (feeding supplement) PROSource Plus  30 mL Oral BID BM  . apixaban  5 mg Oral BID  . vitamin C  500 mg Oral Daily  . chlorhexidine  15 mL Mouth Rinse BID  . chlorpheniramine-HYDROcodone  5 mL Oral Q12H  . collagenase   Topical Daily  . dexamethasone  4 mg Oral Daily  . diltiazem  60 mg Oral Q8H  . feeding supplement  237 mL Oral BID BM  . furosemide  40 mg Intravenous Q12H  . guaiFENesin  1,200 mg Oral BID  . insulin aspart  0-5 Units Subcutaneous QHS  . insulin aspart  0-9 Units Subcutaneous TID WC  . levothyroxine  100 mcg Oral Q0600  . mouth rinse  15 mL Mouth Rinse q12n4p  . nutrition supplement (JUVEN)  1 packet Oral BID BM  . polyethylene glycol  17 g Oral BID  . senna-docusate  1 tablet Oral BID  . zinc sulfate  220 mg Oral Daily   Continuous Infusions:    LOS: 12 days    Time spent: 35 mins.More than 50% of that time was spent in counseling and/or coordination of care.      Shelly Coss, MD [facility] P5/20/2022, 8:22 AM

## 2021-05-15 NOTE — TOC Progression Note (Signed)
Transition of Care [facility]) - Progression Note    Patient Details  Name: [name] MRN: [id number] Date of Birth: September 06, 1951  Transition of [facility]) CM/SW Contact  Purcell Mouton, RN Phone Number: 05/15/2021, 3:51 PM  Clinical Narrative:    Pt plan to discharge to [facility] on Sat 05/16/21 waiting for insurance authorization.    Expected Discharge Plan: [place] (vs SNF) Barriers to Discharge: No Barriers Identified  Expected Discharge Plan and Services Expected Discharge Plan: [place] (vs SNF)       Living arrangements for the past 2 months: Single Family Home                                       Social Determinants of Health (SDOH) Interventions    Readmission Risk Interventions Readmission Risk Prevention Plan 11/10/2020  Transportation Screening Complete  PCP or Specialist Appt within 5-7 Days Complete  Home Care Screening Complete  Medication Review (RN CM) Complete  Some recent data might be hidden

## 2021-05-15 NOTE — Progress Notes (Addendum)
Inpatient Diabetes Program Recommendations  AACE/ADA: New Consensus Statement on Inpatient Glycemic Control (2015)  Target Ranges:  Prepandial:   less than 140 mg/dL      Peak postprandial:   less than 180 mg/dL (1-2 hours)      Critically ill patients:  140 - 180 mg/dL   Results for [name], [name]" (MRN [id number]) as of 05/15/2021 09:16  Ref. Range 05/14/2021 07:59 05/14/2021 12:32 05/14/2021 17:36 05/14/2021 21:16  Glucose-Capillary Latest Ref Range: 70 - 99 mg/dL 238 (H) 218 (H) 192 (H) 254 (H)    Home DM Meds: Metformin 500 mg BID  Current Orders: Novolog Sensitive Correction Scale/ SSI (0-9 units) TID AC + HS    MD- Note patient getting Decadron 4 mg Daily  CBGs >200  If within goals of care to be more aggressive with CBG control in hospital setting, please consider increasing the Novolog SSI to the Moderate (0-15 unit) scale  If PO intake poor, may also consider increasing the frequency of the Novolog SSi to Q4 hours    --Will follow patient during hospitalization--  Wyn Quaker RN, MSN, CDE Diabetes Coordinator Inpatient Glycemic Control Team Team Pager: [phone] (8a-5p)

## 2021-05-16 ENCOUNTER — Inpatient Hospital Stay (HOSPITAL_COMMUNITY): Payer: Medicare Other

## 2021-05-16 DIAGNOSIS — Z515 Encounter for palliative care: Secondary | ICD-10-CM | POA: Diagnosis not present

## 2021-05-16 DIAGNOSIS — Z7189 Other specified counseling: Secondary | ICD-10-CM | POA: Diagnosis not present

## 2021-05-16 DIAGNOSIS — C3490 Malignant neoplasm of unspecified part of unspecified bronchus or lung: Secondary | ICD-10-CM | POA: Diagnosis not present

## 2021-05-16 DIAGNOSIS — C799 Secondary malignant neoplasm of unspecified site: Secondary | ICD-10-CM

## 2021-05-16 LAB — CBC WITH DIFFERENTIAL/PLATELET
Abs Immature Granulocytes: NONE SEEN 10*3/uL (ref 0.00–0.07)
Band Neutrophils: 1 %
Basophils Absolute: 0 10*3/uL (ref 0.0–0.1)
Basophils Relative: 0 %
Blasts: NONE SEEN %
Eosinophils Absolute: 0 10*3/uL (ref 0.0–0.5)
Eosinophils Relative: 0 %
HCT: 37.3 % (ref 36.0–46.0)
Hemoglobin: 11.7 g/dL — ABNORMAL LOW (ref 12.0–15.0)
Immature Granulocytes: NONE SEEN %
Lymphocytes Relative: 2 %
Lymphs Abs: 0.7 10*3/uL (ref 0.7–4.0)
MCH: 26.8 pg (ref 26.0–34.0)
MCHC: 31.4 g/dL (ref 30.0–36.0)
MCV: 85.4 fL (ref 80.0–100.0)
Metamyelocytes Relative: NONE SEEN %
Monocytes Absolute: 1.9 10*3/uL — ABNORMAL HIGH (ref 0.1–1.0)
Monocytes Relative: 7 %
Myelocytes: NONE SEEN %
Neutro Abs: 23.3 10*3/uL — ABNORMAL HIGH (ref 1.7–7.7)
Neutrophils Relative %: 90 %
Platelets: 261 10*3/uL (ref 150–400)
Promyelocytes Relative: NONE SEEN %
RBC Morphology: NORMAL
RBC: 4.37 MIL/uL (ref 3.87–5.11)
RDW: 16.7 % — ABNORMAL HIGH (ref 11.5–15.5)
WBC Morphology: NORMAL
WBC: 26.3 10*3/uL — ABNORMAL HIGH (ref 4.0–10.5)
nRBC: 0 % (ref 0.0–0.2)
nRBC: NONE SEEN /100 WBC

## 2021-05-16 LAB — BASIC METABOLIC PANEL
Anion gap: 13 (ref 5–15)
BUN: 52 mg/dL — ABNORMAL HIGH (ref 8–23)
CO2: 31 mmol/L (ref 22–32)
Calcium: 9.7 mg/dL (ref 8.9–10.3)
Chloride: 90 mmol/L — ABNORMAL LOW (ref 98–111)
Creatinine, Ser: 0.51 mg/dL (ref 0.44–1.00)
GFR, Estimated: >60 mL/min (ref >60)
Glucose, Bld: 173 mg/dL — ABNORMAL HIGH (ref 70–99)
Potassium: 3.7 mmol/L (ref 3.5–5.1)
Sodium: 134 mmol/L — ABNORMAL LOW (ref 135–145)

## 2021-05-16 LAB — BLOOD GAS, ARTERIAL
Acid-Base Excess: 7.9 mmol/L — ABNORMAL HIGH (ref 0.0–2.0)
Bicarbonate: 31.6 mmol/L — ABNORMAL HIGH (ref 20.0–28.0)
Drawn by: 11249
FIO2: 44
O2 Content: 6 L/min
O2 Saturation: 77.6 %
Patient temperature: 97.5
pCO2 arterial: 39.6 mmHg (ref 32.0–48.0)
pH, Arterial: 7.51 — ABNORMAL HIGH (ref 7.350–7.450)
pO2, Arterial: 41.3 mmHg — ABNORMAL LOW (ref 83.0–108.0)

## 2021-05-16 LAB — GLUCOSE, CAPILLARY
Glucose-Capillary: 184 mg/dL — ABNORMAL HIGH (ref 70–99)
Glucose-Capillary: 204 mg/dL — ABNORMAL HIGH (ref 70–99)
Glucose-Capillary: 174 mg/dL — ABNORMAL HIGH (ref 70–99)
Glucose-Capillary: 149 mg/dL — ABNORMAL HIGH (ref 70–99)

## 2021-05-16 MED ORDER — LORAZEPAM 2 MG/ML IJ SOLN
1.0000 mg | INTRAMUSCULAR | Status: DC | PRN
  Administered 2021-05-16 – 2021-05-17 (×3): 1 mg via INTRAVENOUS
  Filled 2021-05-16 (×4): qty 1

## 2021-05-16 MED ORDER — SODIUM CHLORIDE 0.9 % IV SOLN
2.0000 g | Freq: Every day | INTRAVENOUS | Status: DC
  Filled 2021-05-16 (×2): qty 20

## 2021-05-16 MED ORDER — SODIUM CHLORIDE 0.9 % IV SOLN
500.0000 mg | INTRAVENOUS | Status: DC
  Administered 2021-05-17: 500 mg via INTRAVENOUS
  Filled 2021-05-16 (×2): qty 500

## 2021-05-16 MED ORDER — HYDROMORPHONE HCL 1 MG/ML IJ SOLN
1.0000 mg | INTRAMUSCULAR | Status: DC | PRN
  Administered 2021-05-16 – 2021-05-17 (×5): 1 mg via INTRAVENOUS
  Administered 2021-05-17: 2 mg via INTRAVENOUS
  Filled 2021-05-16 (×2): qty 1
  Filled 2021-05-16 (×2): qty 2
  Filled 2021-05-16 (×4): qty 1

## 2021-05-16 MED ORDER — HYDROMORPHONE HCL 1 MG/ML IJ SOLN
0.5000 mg | Freq: Once | INTRAMUSCULAR | Status: AC
Start: 2021-05-16 — End: 2021-05-16
  Administered 2021-05-16: 0.5 mg via INTRAVENOUS
  Filled 2021-05-16: qty 0.5

## 2021-05-16 MED ORDER — FENTANYL 12 MCG/HR TD PT72: 1.0000 | MEDICATED_PATCH | TRANSDERMAL | Status: DC

## 2021-05-16 MED ORDER — LIDOCAINE 5 % EX PTCH
1.0000 | MEDICATED_PATCH | Freq: Every day | CUTANEOUS | Status: DC
  Administered 2021-05-17: 1 via TRANSDERMAL
  Filled 2021-05-16 (×2): qty 1

## 2021-05-16 MED ORDER — GLYCOPYRROLATE 0.2 MG/ML IJ SOLN: 0.2000 mg | INTRAMUSCULAR | Status: DC | PRN

## 2021-05-16 MED ORDER — HALOPERIDOL LACTATE 5 MG/ML IJ SOLN: 1.0000 mg | INTRAMUSCULAR | Status: DC | PRN

## 2021-05-16 NOTE — Accreditation Note (Signed)
RT obtained an arterial blood gas due to patients decreased oxygen sats in the low 80's. RT called RN to let her know the results and RN placed patient on NRB. RN will contact NP with results.

## 2021-05-16 NOTE — Progress Notes (Addendum)
Patient O2 Sat dropped into high 80's while sleeping.  O2 turned up to 6.  O2 sat continued 88-89.  Consulted with respiratory and changed to salter cannula and increased O2 to 8.   O2 dropped to low 80's, RT performed blood gas. Arterial O2 77, so transferred to nonrebreather at 15L.  Will continue to monitor and adjust as needed.

## 2021-05-16 NOTE — Progress Notes (Signed)
PROGRESS NOTE    [name]  UMP:[id number] DOB: 22-Aug-1951 DOA: 05/02/2021 PCP: Benay Pike, MD   Chief Complain: Back pain  Brief Narrative: Patient is a 69 year old female with history of chronic A. fib on Eliquis, bladder cancer, hypertension, coronary artery disease, OSA, hypothyroidism, CKD stage IIIa, type of diabetes, recent COVID who presents with complaint of back pain.  Patient was also found to have postobstructive pneumonia due to right upper lobe mass/T8 compression fracture.  Neurosurgery recommended steroids and pain control.  She underwent bronchoscopy/biopsy of the right upper lobe mass on 5/12.  She was started on antibiotic for postobstructive pneumonia, finished the course.. Radiation oncology consulted,started  radiation treatment for T8 compression fracture. Ultimate plan is to discharge to skilled nursing facility, off isolation on 5/17.  Palliative care consulted for goals of care due to poor prognosis, plan is to continue full scope of treatment.  Hospital course remarkable for persistent A. fib with RVR, acute hypoxic respiratory failure.  During early morning of 05/15/2020, she became more short of breath, requiring 100% nonrebreather . Goals  of care discussed with family, palliative care discussed with the family, currently she is DNR/DNI with the goal of not escalating the care  Assessment & Plan:   Principal Problem:   Lung cancer metastatic to bone [facility]) Active Problems:   Hypothyroidism   Essential hypertension, benign   Atrial fibrillation ([place])   Diabetes ([place])   Acute-on-chronic kidney injury ([place])   COVID-19 virus infection   Pneumonia due to COVID-19 virus  Acute hypoxic respiratory failure: Initially suspected to be secondary to postobstructive pneumonia from right upper lobe mass/covid.   Chest x-ray done on 05/11/21 showed right upper lobe mass, no pneumonia or fluid.    She also looked mildly volume overloaded, given few doses of    Lasix IV .   BNP was elevated. She required more oxygen in the early morning of 04/2171.  Chest x-ray showed stable right upper lobe mass, atelectasis/consolidation/collapse of right lower lobe.  Started antibiotic for coverage of possible new pneumonia.  She had presented with shortness of breath, hypoxic on presentation, saturation was 74% room air.  Finished the course of Unasyn.  Continue as needed bronchodilators. Sputum culture showed normal flora.  Right upper lobe mass with metastasis: CT scan showed right upper lobe mass with metastatic mediastinal/hilar lymphadenopathy as well as bony metastasis.  She was recently treated for L4 compression fracture.  CT PE was negative.  The lung mass appeared to have increased in size over few weeks.  Status post bronchoscopy and biopsy on 5/12.  Pathology showed poorly differentiated carcinoma.  PCCM were following.  We have notified  Oncology for follow up as an outpatient.She was also supposed to follow up with PCCM on discharge. Mri brain showed punctate focus of contrast enhancement at the left insula suggesting  Vascular vs a small metastatic lesion. Likely she will end up dying here or at hospice facility   T8 compression fracture: As seen on the CT scan.  Likely pathological fracture.  Seen by neurosurgery Dr Annette Stable on 5/9.  Neurosurgery recommended pain management, steroids, no surgical intervention.  Radiation oncology started  radiation therapy for T8 compression fracture. She initially presented with back pain.  Continue supportive care, pain management.  PT/OT evaluation done, recommended skilled nursing facility on discharge.  On steroid taper.  Hospital course remarkable for severe back pain.  Continue aggressive pain management, added fentanyl and lidocaine patch today.  COVID-19 infection: First positive  test on 4/20,022.  Again positive on 5/7.  She was treated with monupiravir from 4/28-5/3.She is off isolation since 05/12/2021.  She is requiring 3 L of  oxygen per minute.  Permanent A. fib with RVR: She was on oral cardizem.  Hospital course remarkable for persistent A. fib with RVR and had to be put on Cardizem drip.on  Eliquis for anticoagulation.  Type II diabetes mellitus: Hemoglobin A1C of 6.2.  Continue to monitor.  Continue sliding-scale insulin.  AKI on CKD stage IIIa: Currently kidney function near baseline.  Baseline creatinine around 1.05.  Elevated LFTs: Previous LFTs were normal.  Could be associated  with COVID infection.  Scanning showed dilated gallbladder without visible gallstones or wall thickening, no pericholecystic fluid, unremarkable liver.  Hepatitis panel negative.  Statin discontinued.  Monitor.  Left hip unstageable ulcer: Present on admission.  Continue foam dressing.  Wound care consulted and following  Hypothyroidism: On Synthyroid  Constipation: Continue bowel regimen  Obesity: BMI of 32.9.  Debility/deconditioning: PT/OT recommending SNF on discharge.  Patient remains extremely weak.  Goals of care: Multiple comorbidities, now diagnosed with poorly differentiated lung cancer.  Palliative care consult for goals of care. DNR/DNI.very sick      Nutrition Problem: Increased nutrient needs Etiology: acute illness,catabolic illness ([id number] infection)      DVT prophylaxis:Lovenox Code Status: Full Family Communication: Discussed with husband on 05/17/21 Status is: Inpatient  Remains inpatient appropriate because:Inpatient level of care appropriate due to severity of illness   Dispo: The patient is from: Home              Anticipated d/c is to: SNF              Patient currently is not medically stable to d/c.   Difficult to place patient No    Consultants: PCCM, radiation oncology, neurosurgery  Procedures: Bronchoscopy/biopsy  Antimicrobials:  Anti-infectives (From admission, onward)   Start     Dose/Rate Route Frequency Ordered Stop   05/16/21 0845  cefTRIAXone (ROCEPHIN) 2 g in  sodium chloride 0.9 % 100 mL IVPB        2 g 200 mL/hr over 30 Minutes Intravenous Every 24 hours 05/16/21 0750     05/16/21 0845  azithromycin (ZITHROMAX) 500 mg in sodium chloride 0.9 % 250 mL IVPB        500 mg 250 mL/hr over 60 Minutes Intravenous Every 24 hours 05/16/21 0750     05/10/21 0000  Ampicillin-Sulbactam (UNASYN) 3 g in sodium chloride 0.9 % 100 mL IVPB        3 g 200 mL/hr over 30 Minutes Intravenous Every 6 hours 05/09/21 1652 05/11/21 0027   05/06/21 1200  Ampicillin-Sulbactam (UNASYN) 3 g in sodium chloride 0.9 % 100 mL IVPB  Status:  Discontinued        3 g 200 mL/hr over 30 Minutes Intravenous Every 6 hours 05/06/21 0933 05/09/21 1652   05/03/21 1230  Ampicillin-Sulbactam (UNASYN) 3 g in sodium chloride 0.9 % 100 mL IVPB  Status:  Discontinued        3 g 200 mL/hr over 30 Minutes Intravenous Every 6 hours 05/03/21 1136 05/03/21 1140   05/03/21 1230  cefTRIAXone (ROCEPHIN) 2 g in sodium chloride 0.9 % 100 mL IVPB  Status:  Discontinued        2 g 200 mL/hr over 30 Minutes Intravenous Every 24 hours 05/03/21 1140 05/06/21 0933   05/03/21 1230  azithromycin (ZITHROMAX) tablet 500 mg  Status:  Discontinued  500 mg Oral Daily 05/03/21 1140 05/06/21 0933   05/02/21 1615  piperacillin-tazobactam (ZOSYN) IVPB 3.375 g        3.375 g 100 mL/hr over 30 Minutes Intravenous  Once 05/02/21 1603 05/02/21 1758      Subjective:  Patient seen and examined the bedside this morning.  She was requiring 100% nonrebreather for maintenance of saturation barely at  90%.  She looks very lethargic today.  Unable to open her eyes on calling her name.  Bedbound.  Husband at the bedside.  Extensive goals of discussion done at the bedside with the husband.  Objective: Vitals:   05/16/21 0359 05/16/21 0400 05/16/21 0435 05/16/21 0600  BP:  117/72  111/70  Pulse: (!) 114 93 85 (!) 111  Resp:  14 11 14   Temp:      TempSrc:      SpO2:  (!) 89% 91% (!) 89%  Weight:      Height:         Intake/Output Summary (Last 24 hours) at 05/16/2021 0751 Last data filed at 05/16/2021 0220 Gross per 24 hour  Intake 0 ml  Output 900 ml  Net -900 ml   Filed Weights   05/02/21 1347  Weight: 84.4 kg    Examination:   General exam: Extremely sick, lethargic Respiratory system: Diminished bilateral air entry Cardiovascular system: Irregular irregular rhythm Gastrointestinal system: Abdomen is nondistended, soft and nontender. Central nervous system: Not alert or awake Extremities: No edema, no clubbing ,no cyanosis Skin: No rashes, no icterus   Data Reviewed: I have personally reviewed following labs and imaging studies  CBC: Recent Labs  Lab 05/12/21 0851 05/13/21 0346 05/14/21 0346 05/15/21 0346 05/16/21 0420  WBC 21.4* 17.8* 19.4* 21.4* 26.3*  NEUTROABS 18.2* 15.5* 17.1* 18.7* 23.3*  HGB 11.5* 10.5* 11.6* 11.2* 11.7*  HCT 36.5 32.6* 36.5 35.2* 37.3  MCV 86.1 85.1 86.5 85.0 85.4  PLT 348 300 315 292 829   Basic Metabolic Panel: Recent Labs  Lab 05/12/21 0851 05/13/21 0346 05/14/21 0346 05/15/21 0346 05/16/21 0420  NA 132* 131* 133* 133* 134*  K 4.5 4.4 5.2* 3.6 3.7  CL 95* 94* 92* 90* 90*  CO2 28 27 31  34* 31  GLUCOSE 239* 214* 210* 238* 173*  BUN 35* 33* 36* 51* 52*  CREATININE 0.63 0.71 0.79 0.73 0.51  CALCIUM 8.9 8.8* 9.0 8.7* 9.7   GFR: Estimated Creatinine Clearance: 65.4 mL/min (by C-G formula based on SCr of 0.51 mg/dL). Liver Function Tests: Recent Labs  Lab 05/10/21 0258 05/11/21 0408 05/12/21 0851 05/13/21 0346  AST 170* 125* 91* 76*  ALT 261* 227* 196* 169*  ALKPHOS 133* 121 119 105  BILITOT 0.4 0.4 0.6 0.7  PROT 6.7 6.4* 7.0 6.6  ALBUMIN 2.4* 2.3* 2.5* 2.5*   No results for input(s): LIPASE, AMYLASE in the last 168 hours. No results for input(s): AMMONIA in the last 168 hours. Coagulation Profile: No results for input(s): INR, PROTIME in the last 168 hours. Cardiac Enzymes: No results for input(s): CKTOTAL, CKMB,  CKMBINDEX, TROPONINI in the last 168 hours. BNP (last 3 results) No results for input(s): PROBNP in the last 8760 hours. HbA1C: No results for input(s): HGBA1C in the last 72 hours. CBG: Recent Labs  Lab 05/14/21 2116 05/15/21 0723 05/15/21 1209 05/15/21 1646 05/16/21 0748  GLUCAP 254* 190* 227* 210* 184*   Lipid Profile: No results for input(s): CHOL, HDL, LDLCALC, TRIG, CHOLHDL, LDLDIRECT in the last 72 hours. Thyroid Function Tests: No results  for input(s): TSH, T4TOTAL, FREET4, T3FREE, THYROIDAB in the last 72 hours. Anemia Panel: No results for input(s): VITAMINB12, FOLATE, FERRITIN, TIBC, IRON, RETICCTPCT in the last 72 hours. Sepsis Labs: No results for input(s): PROCALCITON, LATICACIDVEN in the last 168 hours.  Recent Results (from the past 240 hour(s))  Expectorated Sputum Assessment w Gram Stain, Rflx to Resp Cult     Status: None   Collection Time: 05/07/21  6:03 PM   Specimen: Expectorated Sputum  Result Value Ref Range Status   Specimen Description EXPECTORATED SPUTUM  Final   Special Requests NONE  Final   Sputum evaluation   Final    THIS SPECIMEN IS ACCEPTABLE FOR SPUTUM CULTURE Performed at [facility], [place] [address], [place], [place] [address]    Report Status 05/07/2021 FINAL  Final  Culture, Respiratory w Gram Stain     Status: None   Collection Time: 05/07/21  6:03 PM  Result Value Ref Range Status   Specimen Description   Final    EXPECTORATED SPUTUM Performed at [place] [address], [place], [place] [address]    Special Requests   Final    NONE Reflexed from [phone] Performed at [facility], [place] [address], [place], [place] [address]    Gram Stain   Final    MODERATE WBC PRESENT,BOTH PMN AND MONONUCLEAR RARE GRAM POSITIVE COCCI IN PAIRS    Culture   Final    RARE Consistent with normal respiratory flora. No Pseudomonas species isolated Performed at [place] [address], [place], [place] [address]    Report Status 05/10/2021 FINAL  Final         Radiology Studies: DG CHEST PORT 1 VIEW  Result Date: 05/16/2021 CLINICAL DATA:  70 year old female with shortness of breath. Right upper lobe lung cancer. EXAM: PORTABLE CHEST 1 VIEW COMPARISON:  Portable chest 05/11/2021 and earlier. FINDINGS: Portable AP semi upright view at 0433 hours. Right upper lobe mass and asymmetric enlargement of the right hilum again noted. Stable cardiomegaly and mediastinal contours. No pneumothorax or pulmonary edema. But interval opacification of the left lung base largely obscuring the left hemidiaphragm. Left pleural effusion is possible. Negative visible bowel gas pattern. Stable visualized osseous structures, pathologic near vertebra plana of T8. IMPRESSION: 1. Interval opacification of the left lung base which could reflect a combination of atelectasis, pneumonia, small pleural effusion. 2. Otherwise stable chest with right upper lobe lung mass, abnormal right hilum and T8 vertebra. Electronically Signed   By: Genevie Ann M.D.   On: 05/16/2021 04:58        Scheduled Meds: . (feeding supplement) PROSource Plus  30 mL Oral BID BM  . apixaban  5 mg Oral BID  . vitamin C  500 mg Oral Daily  . chlorhexidine  15 mL Mouth Rinse BID  . chlorpheniramine-HYDROcodone  5 mL Oral Q12H  . collagenase   Topical Daily  . dexamethasone  4 mg Oral Daily  . feeding supplement  237 mL Oral BID BM  . fentaNYL  1 patch Transdermal Q72H  . guaiFENesin  1,200 mg Oral BID  .  HYDROmorphone (DILAUDID) injection  0.5 mg Intravenous Once  . insulin aspart  0-5 Units Subcutaneous QHS  . insulin aspart  0-9 Units Subcutaneous TID WC  . levothyroxine  100 mcg Oral Q0600  . lidocaine  1 patch Transdermal Q24H  . mouth rinse  15 mL Mouth Rinse q12n4p  . nutrition supplement (  JUVEN)  1 packet Oral BID BM  . polyethylene glycol  17 g Oral BID  . senna-docusate  1 tablet Oral BID  . zinc  sulfate  220 mg Oral Daily   Continuous Infusions: . azithromycin    . cefTRIAXone (ROCEPHIN)  IV    . diltiazem (CARDIZEM) infusion 12.5 mg/hr (05/16/21 0352)     LOS: 13 days    Time spent: 35 mins.More than 50% of that time was spent in counseling and/or coordination of care.      Shelly Coss, MD [facility] P5/21/2022, 7:51 AM

## 2021-05-16 NOTE — Progress Notes (Signed)
I was paged by Debbie's bedside nurse, Staci, to be with patient and family as she was declining rapidly.  When I arrived, the family had just met with Dr. Domingo Cocking and were aware that she is dying.  I offered grief support to them as different family members came to see her.  I also provided prayer and spiritual support to [place] as she struggled with not wanting to leave her family.  I provided bedside prayer with her husband, daughter-in-law and grandchildren at their request.  I am on-call for the remainder of the weekend.  Please page as needs arise or if patient's status changes.  [place], Bcc Pager, [phone] 1:19 PM

## 2021-05-16 NOTE — Progress Notes (Signed)
Palliative Care Progress Note  Reason for visit: Goals of care in light of metastatic lung cancer  Received call from Dr. Tawanna Solo that Ms. Sergent has worsened overnight.  I discussed in detail with bedside RN as well.  I met with multiple family members including her husband, granddaughter, and children including 3 children in person and 1 additional via phone.  We discussed her clinical course this admission and reviewed new diagnosis of metastatic lung cancer.  We talked about prior plan for radiation for bony mets while also working to improve her functional status enough to have her evaluated by oncology.  We then discussed acute change in her status and that this is secondary to primary lung cancer causing obstruction which has likely led to recurrence of postobstructive pneumonia.  I shared with them my concern that she is going to continue to worsen regardless of interventions moving forward.  We discussed options for care including continuation of any and all aggressive interventions, transitioning to full comfort care, or continuing to treat with current medical interventions without escalation of care while also providing aggressive symptom management to ensure that she is not suffering.  I talked with family about my concern that she is going to worsen and is not likely to survive hospitalization regardless of interventions moving forward.  Family is all in agreement that they would not want her to suffer and were in agreement with transition to DNR.  Following discussion, decision was made to continue with noninvasive medical interventions such as antibiotics, fluids and Cardizem drip, but also provide medications as needed for comfort with plan for no escalation of care to invasive interventions that are going to cause her to suffer.   -DNR/DNI.   -Comfort and avoidance of suffering is top priority moving forward but family would like to also continue with current interventions without  escalation of care.  We discussed utilization of medications for comfort regardless of side effects or concern such as hypotension and respiratory rate if she needs the medications to be comfortable.  I expressed my concern that she is likely to die sometime in the next day or 2 regardless of interventions moving forward. -She is appropriate to have visitation per end-of-life policy. -Palliative will continue to follow and support. -Discussed with family, bedside RN, primary attending, chaplain, and charge RN.  Total time: 50 minutes  Greater than 50%  of this time was spent counseling and coordinating care related to the above assessment and plan.  Micheline Rough, MD [place] [facility] [phone]

## 2021-05-16 NOTE — Progress Notes (Signed)
I met briefly with patient husband to discuss goals of care in light of Ms. Adler decline.  Her children are on their way to the hospital and he would like to have them present for discussion.  Her RN will let me know when they arrive.  Micheline Rough, MD [place] [facility] [phone]

## 2021-05-17 DIAGNOSIS — C3490 Malignant neoplasm of unspecified part of unspecified bronchus or lung: Secondary | ICD-10-CM | POA: Diagnosis not present

## 2021-05-17 DIAGNOSIS — Z7189 Other specified counseling: Secondary | ICD-10-CM | POA: Diagnosis not present

## 2021-05-17 DIAGNOSIS — C7951 Secondary malignant neoplasm of bone: Secondary | ICD-10-CM | POA: Diagnosis not present

## 2021-05-17 DIAGNOSIS — C799 Secondary malignant neoplasm of unspecified site: Secondary | ICD-10-CM | POA: Diagnosis not present

## 2021-05-17 DIAGNOSIS — Z515 Encounter for palliative care: Secondary | ICD-10-CM | POA: Diagnosis not present

## 2021-05-17 LAB — CBC WITH DIFFERENTIAL/PLATELET
Band Neutrophils: 1 %
Basophils Absolute: 0.1 10*3/uL (ref 0.0–0.1)
Basophils Relative: 0 %
Blasts: NONE SEEN %
Eosinophils Absolute: 0 10*3/uL (ref 0.0–0.5)
Eosinophils Relative: 0 %
HCT: 34.9 % — ABNORMAL LOW (ref 36.0–46.0)
Hemoglobin: 10.9 g/dL — ABNORMAL LOW (ref 12.0–15.0)
Lymphocytes Relative: 2 %
Lymphs Abs: 0.6 10*3/uL — ABNORMAL LOW (ref 0.7–4.0)
MCH: 27.3 pg (ref 26.0–34.0)
MCHC: 31.2 g/dL (ref 30.0–36.0)
MCV: 87.5 fL (ref 80.0–100.0)
Metamyelocytes Relative: NONE SEEN %
Monocytes Absolute: 0.8 10*3/uL (ref 0.1–1.0)
Monocytes Relative: 1 %
Myelocytes: NONE SEEN %
Neutro Abs: 31.8 10*3/uL — ABNORMAL HIGH (ref 1.7–7.7)
Neutrophils Relative %: 96 %
Platelets: 217 10*3/uL (ref 150–400)
Promyelocytes Relative: NONE SEEN %
RBC Morphology: NORMAL
RBC: 3.99 MIL/uL (ref 3.87–5.11)
RDW: 17 % — ABNORMAL HIGH (ref 11.5–15.5)
WBC Morphology: NORMAL
WBC: 33.6 10*3/uL — ABNORMAL HIGH (ref 4.0–10.5)
nRBC: 0 % (ref 0.0–0.2)
nRBC: NONE SEEN /100 WBC

## 2021-05-17 LAB — BASIC METABOLIC PANEL
Anion gap: 11 (ref 5–15)
BUN: 47 mg/dL — ABNORMAL HIGH (ref 8–23)
CO2: 33 mmol/L — ABNORMAL HIGH (ref 22–32)
Calcium: 10.2 mg/dL (ref 8.9–10.3)
Chloride: 91 mmol/L — ABNORMAL LOW (ref 98–111)
Creatinine, Ser: 0.65 mg/dL (ref 0.44–1.00)
GFR, Estimated: >60 mL/min (ref >60)
Glucose, Bld: 143 mg/dL — ABNORMAL HIGH (ref 70–99)
Potassium: 3.8 mmol/L (ref 3.5–5.1)
Sodium: 135 mmol/L (ref 135–145)

## 2021-05-17 LAB — GLUCOSE, CAPILLARY: Glucose-Capillary: 129 mg/dL — ABNORMAL HIGH (ref 70–99)

## 2021-05-17 MED ORDER — MORPHINE SULFATE (CONCENTRATE) 10 MG/0.5ML PO SOLN
20.0000 mg | ORAL | Status: DC | PRN
  Administered 2021-05-17: 20 mg via ORAL
  Filled 2021-05-17: qty 1

## 2021-05-17 MED ORDER — POLYVINYL ALCOHOL 1.4 % OP SOLN
1.0000 [drp] | Freq: Four times a day (QID) | OPHTHALMIC | Status: DC | PRN
  Filled 2021-05-17: qty 15

## 2021-05-17 MED ORDER — BIOTENE DRY MOUTH MT LIQD: 15.0000 mL | OROMUCOSAL | Status: DC | PRN

## 2021-05-17 MED ORDER — DEXTROSE-NACL 5-0.45 % IV SOLN: INTRAVENOUS | Status: DC

## 2021-05-17 MED ORDER — MORPHINE SULFATE (CONCENTRATE) 10 MG/0.5ML PO SOLN
20.0000 mg | ORAL | Status: DC | PRN
  Administered 2021-05-17 (×3): 20 mg via ORAL
  Filled 2021-05-17 (×3): qty 1

## 2021-05-17 MED ORDER — LORAZEPAM 2 MG/ML PO CONC
1.0000 mg | ORAL | Status: DC | PRN
  Administered 2021-05-17: 1 mg via ORAL
  Filled 2021-05-17: qty 0.5

## 2021-05-17 NOTE — Progress Notes (Signed)
Daily Progress Note   Patient Name: [name]       Date: 05/17/2021 DOB: [birth year]  Age: 70 y.o. MRN#: [id number] Attending Physician: Shelly Coss, MD Primary Care Physician: Benay Pike, MD Admit Date: 05/02/2021  Reason for Consultation/Follow-up: Establishing goals of care, Inpatient hospice referral, Non pain symptom management, Pain control and Terminal Care  Subjective: I saw and examined [name] today.  She is much sleepier than yesterday and barely arouses.  Her husband reports she is discussed with Dr. Tawanna Solo and they talked about potential for transition to residential hospice.  I reviewed plan for comfort moving forward and we discussed type of care offered at residential hospice.  We discussed discontinuation of interventions not specifically related to helping her feel better including discontinuation of antibiotics.  He understands that she is dying and these are not beneficial to continue as she has continued to worsen despite antibiotic therapy.  He is concerned because family had come to visit her and returned away by the front desk.  I called and clarified that she is comfort care and visitation should be per end-of-life policy exemptions.  Length of Stay: 14  Current Medications: Scheduled Meds:  . (feeding supplement) PROSource Plus  30 mL Oral BID BM  . chlorhexidine  15 mL Mouth Rinse BID  . collagenase   Topical Daily  . feeding supplement  237 mL Oral BID BM  . fentaNYL  1 patch Transdermal Q72H  . lidocaine  1 patch Transdermal Daily  . mouth rinse  15 mL Mouth Rinse q12n4p    Continuous Infusions:   PRN Meds: antiseptic oral rinse, glycopyrrolate, haloperidol lactate, HYDROmorphone (DILAUDID) injection, LORazepam, oxyCODONE, phenol,  polyvinyl alcohol  Physical Exam         General: Sleepy.  Only arouses to pain.   HEENT: No bruits, no goiter, no JVD Heart: Regular rate and rhythm. No murmur appreciated. Lungs: Decreased air movement.  Scattered coarse. Abdomen: Soft, nontender, nondistended, positive bowel sounds.  Ext: No significant edema, strong radial pulse Skin: Some peripheral cooling   Vital Signs: BP 99/68 (BP Location: Right Arm)   Pulse 100   Temp 97.7 F (36.5 C) (Oral)   Resp 15   Ht 5\' 3"  (1.6 m)   Wt 84.4 kg   LMP  (LMP Unknown)  SpO2 (!) 88%   BMI 32.95 kg/m  SpO2: SpO2: (!) 88 % O2 Device: O2 Device: Nasal Cannula,NRB O2 Flow Rate: O2 Flow Rate (L/min): 30 L/min  Intake/output summary:   Intake/Output Summary (Last 24 hours) at 05/17/2021 1131 Last data filed at 05/17/2021 5366 Gross per 24 hour  Intake 0 ml  Output 800 ml  Net -800 ml   LBM: Last BM Date: 05/16/21 Baseline Weight: Weight: 84.4 kg Most recent weight: Weight: 84.4 kg       Palliative Assessment/Data:      Patient Active Problem List   Diagnosis Date Noted  . Pneumonia due to COVID-19 virus 05/03/2021  . 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 Acute-on-chronic kidney injury ([place]) 05/02/2021  . COVID-19 virus infection 05/02/2021  . Lung mass 03/21/2021  . Chronic midline low back pain without sciatica 02/03/2021  . AKI (acute kidney injury) ([place]) 11/16/2020  . Closed trimalleolar fracture of left ankle 11/09/2020  . Elbow pain, left 08/31/2020  . Conductive hearing loss, bilateral 07/25/2020  . Peripheral neuropathy 06/15/2020  . Chronic kidney disease (CKD), stage III (moderate) ([place]) 12/06/2019  . Dyslipidemia 11/25/2019  . Osteoarthritis of right knee 07/11/2019  . Microalbuminuria 12/07/2018  . OSA (obstructive sleep apnea) 03/16/2018  . History of bladder cancer 08/24/2017  . Claudication ([place]) 07/21/2017  . Onychomycosis of toenail 06/09/2017  . Long-term (current) use of  anticoagulants 02/19/2015  . Diabetes ([place]) 04/11/2014  . Atrial fibrillation ([place]) 12/18/2013  . Urinary incontinence 05/06/2011  . HLD (hyperlipidemia) 04/29/2009  . Obesity 04/29/2009  . Essential hypertension, benign 04/29/2009  . Coronary atherosclerosis 04/29/2009  . Hypothyroidism 02/23/2007  . MYOCARDIAL INFARCTION, OLD 02/23/2007  . Personal history of colonic adenoma 11/11/2004    Palliative Care Assessment & Plan   Patient Profile: 70 year old female with postobstructive pneumonia secondary to new lung lesion.  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  Unfortunately, her clinical status is continued to decline and plan moving forward is for comfort.  Recommendations/Plan:  DNR/DNI  Plan for residential hospice for end-of-life care.  Discussed transition to comfort care here in the hospital with discontinuation of antibiotics.  Her husband understands and is in agreement.  Goals of Care and Additional Recommendations:  Limitations on Scope of Treatment: Full Comfort Care  Code Status:    Code Status Orders  (From admission, onward)         Start     Ordered   05/17/21 1013  Do not attempt resuscitation (DNR)  Continuous       Question Answer Comment  In the event of cardiac or respiratory ARREST Do not call a "code blue"   In the event of cardiac or respiratory ARREST Do not perform Intubation, CPR, defibrillation or ACLS   In the event of cardiac or respiratory ARREST Use medication by any route, position, wound care, and other measures to relive pain and suffering. May use oxygen, suction and manual treatment of airway obstruction as needed for comfort.      05/17/21 1012        Code Status History    Date Active Date Inactive Code Status Order ID Comments User Context   05/16/2021 1121 05/17/2021 1012 DNR [id number]  Micheline Rough, MD Inpatient   05/02/2021 1944 05/16/2021 1121 Full Code [id number]  Orene Desanctis, DO ED    11/09/2020 2044 11/11/2020 2145 Full Code [id number]  Modena Nunnery D, DO ED   11/09/2020 1903 11/09/2020 2044 Full Code [id number]  Delray Alt, PA-C ED   07/10/2018 1644 07/11/2018 2151 Full Code [id number]  Matilde Haymaker, MD Inpatient   09/11/2015 1604 09/12/2015 1832 Full Code [id number]  Veatrice Bourbon, MD Inpatient   10/29/2014 1730 10/30/2014 1727 Full Code [id number]  Lavon Paganini, MD Inpatient   Advance Care Planning Activity       Prognosis:   < 2 weeks-she has newly diagnosed lung mass and postobstructive pneumonia.  Her respiratory status decompensated yesterday.  Plan today to transition to full comfort.  I anticipate her prognosis to be days to less than 2 weeks.  Discharge Planning:  Hospice facility  Care plan was discussed with husband, bedside RN, hospice liaison, and Dr. Tawanna Solo  Thank you for allowing the Palliative Medicine Team to assist in the care of this patient.   Time In: 0950 Time Out: 1030 Total Time 40 Prolonged Time Billed No      Greater than 50%  of this time was spent counseling and coordinating care related to the above assessment and plan.  Micheline Rough, MD  Please contact Palliative Medicine Team phone at [phone] for questions and concerns.

## 2021-05-17 NOTE — Progress Notes (Signed)
report called to [facility] to receiving nurse Helene Kelp at [phone].    [facility] came to pick patient up at 5:50pm.  Husband at bedside.   No questions at this time.  IV line and belongings packed by Jake Seats N.

## 2021-05-17 NOTE — Discharge Summary (Signed)
Physician Discharge Summary  COILA WARDELL XIP:[id number] DOB: Apr 10, 1951 DOA: 05/02/2021  PCP: Benay Pike, MD  Admit date: 05/02/2021 Discharge date: 05/17/2021  Admitted From: Home Disposition:  Residential Hospice  Discharge Condition:Guarded CODE STATUS: Comfort Care   Brief/Interim Summary: Patient is a 70 year old female with history of chronic A. fib on Eliquis, bladder cancer, hypertension, coronary artery disease, OSA, hypothyroidism, CKD stage IIIa, type of diabetes, recent COVID who presents with complaint of back pain.  Patient was also found to have postobstructive pneumonia due to right upper lobe mass/T8 compression fracture.  Neurosurgery recommended steroids and pain control.  She underwent bronchoscopy/biopsy of the right upper lobe mass on 5/12.  She was started on antibiotic for postobstructive pneumonia, finished the course.Radiation oncology consulted,started  radiation treatment for T8 compression fracture. Palliative care consulted for goals of care due to poor prognosis, plan was to continue full scope of treatment.  Hospital course remarkable for persistent A. fib with RVR, acute hypoxic respiratory failure.  During early morning of 05/15/2020, she became more short of breath, requiring 100% nonrebreather . Goals  of care discussed with family, palliative care discussed with the family, code status changed to  DNR/DNI with the goal of not escalating the care.She remains extremely sick.  Family wanted residential hospice.  She can be discharged residential hospice today  Discharge Diagnoses:  Principal Problem:   Lung cancer metastatic to bone [facility]) Active Problems:   Hypothyroidism   Essential hypertension, benign   Atrial fibrillation (HCC)   Diabetes ([place])   Acute-on-chronic kidney injury ([place])   COVID-19 virus infection   Pneumonia due to COVID-19 virus    Discharge Instructions  Discharge Instructions    No wound care   Complete by: As directed       Allergies as of 05/17/2021      Reactions   Percocet [oxycodone-acetaminophen] Itching      Medication List    STOP taking these medications   acetaminophen 500 MG tablet Commonly known as: TYLENOL   diltiazem 240 MG 24 hr capsule Commonly known as: CARDIZEM CD   Eliquis 5 MG Tabs tablet Generic drug: apixaban   HYDROcodone-acetaminophen 5-325 MG tablet Commonly known as: NORCO/VICODIN   HYDROmorphone 4 MG tablet Commonly known as: Dilaudid   levothyroxine 100 MCG tablet Commonly known as: SYNTHROID   lisinopril 2.5 MG tablet Commonly known as: ZESTRIL   metFORMIN 500 MG 24 hr tablet Commonly known as: GLUCOPHAGE-XR   methocarbamol 500 MG tablet Commonly known as: ROBAXIN   methylPREDNISolone 4 MG Tbpk tablet Commonly known as: MEDROL DOSEPAK   OneTouch Delica Lancets Fine Misc   oxyCODONE 5 MG immediate release tablet Commonly known as: Oxy IR/ROXICODONE   rosuvastatin 40 MG tablet Commonly known as: CRESTOR   senna 8.6 MG Tabs tablet Commonly known as: SENOKOT   Systane 0.4-0.3 % Soln Generic drug: Polyethyl Glycol-Propyl Glycol       Contact information for follow-up providers    Collene Gobble, MD Follow up in 1 day(s).   Specialty: Pulmonary Disease Why: Dr. Lamonte Sakai will call you with biopsy results. You do not need Contact information: [address] [place] [id number] [phone]            Contact information for after-discharge care    Destination    [facility] Preferred SNF .   Service: Skilled Nursing Contact information: [address] [place] [phone]  Allergies  Allergen Reactions  . Percocet [Oxycodone-Acetaminophen] Itching    Consultations:  Palliative care, radiation oncology   Procedures/Studies: CT Angio Chest PE W/Cm &/Or Wo Cm  Result Date: 05/02/2021 CLINICAL DATA:  Abdominal pain and acute, non localized lower to mid back  pain. Back surgery 3 weeks ago. Recently diagnosed COVID-19. EXAM: CT ANGIOGRAPHY CHEST CT ABDOMEN AND PELVIS WITH CONTRAST TECHNIQUE: Multidetector CT imaging of the chest was performed using the standard protocol during bolus administration of intravenous contrast. Multiplanar CT image reconstructions and MIPs were obtained to evaluate the vascular anatomy. Multidetector CT imaging of the abdomen and pelvis was performed using the standard protocol during bolus administration of intravenous contrast. CONTRAST:  85mL OMNIPAQUE IOHEXOL 350 MG/ML SOLN COMPARISON:  Chest, abdomen and pelvis CT dated 03/15/2021 FINDINGS: CTA CHEST FINDINGS Cardiovascular: Mildly enlarged heart. Normally opacified pulmonary arteries with no pulmonary arterial filling defects seen. Atheromatous calcifications, including the coronary arteries and aorta. Mediastinum/Nodes: Multiple significantly enlarged, confluent, low density the right hilar lymph nodes with vascular encasement with mild progression. This includes a lateral right hilar nodal mass with a short axis diameter of 3.3 cm on image number 41/4. Enlarged left hilar node without significant change, measuring 1.1 cm in short axis diameter on image number 41/4 Enlarged precarinal node with a short axis diameter of 1.3 cm on image number 35/4. Unremarkable esophagus.  Small, poorly visualized thyroid gland. Lungs/Pleura: Irregular right upper lobe mass measuring 5.1 x 3.2 cm on image number 39/11. This previously measured 4.4 x 2.3 cm in corresponding dimensions. 3 mm nodule more inferiorly in the outer right upper lobe on image number 47/11, without significant change. Four additional smaller nodules in the superior aspect of the right middle lobe, without significant change. Mild bilateral centrilobular bullous changes without significant change. Mild peripheral eccentric or a shin of the interstitial markings in both lungs without significant change. No pleural fluid.  Musculoskeletal: Lytic and sclerotic changes involving the T8 vertebral body with interval 50% collapse of the vertebral body and soft tissue extending into the anterior aspect of the spinal canal, causing moderate canal stenosis. There is also mild diffuse paraspinal extension extending superiorly and inferiorly. This is also involving the pedicles and posterior elements bilaterally with a pars fracture on the left. The fracture is nondisplaced. Lytic lesion in the posterior, superior aspect of the T9 vertebral body. Additional lytic lesion involving the inferior aspects of the T9 inferior facets without canal extension. There is a suggestion of poorly defined, lytic areas in additional thoracic vertebral bodies. Stable old, mild T11 and T12 superior endplate compression deformities. Thoracic and lower cervical spine degenerative changes. There is also an oval lytic lesion in the posterior aspect of the left 3rd rib. Review of the MIP images confirms the above findings. CT ABDOMEN and PELVIS FINDINGS Hepatobiliary: Interval dilated gallbladder without visible gallstones or wall thickening. No pericholecystic fluid. Unremarkable liver. Pancreas: Moderate diffuse pancreatic atrophy. Spleen: Normal in size without focal abnormality. Adrenals/Urinary Tract: Adrenal glands are unremarkable. Kidneys are normal, without renal calculi, focal lesion, or hydronephrosis. Bladder is unremarkable. Stomach/Bowel: Stomach is within normal limits. Appendix appears normal. No evidence of bowel wall thickening, distention, or inflammatory changes. Vascular/Lymphatic: Atheromatous arterial calcifications without aneurysm. No enlarged lymph nodes. Reproductive: Uterus and bilateral adnexa are unremarkable. Other: Small umbilical hernia containing fat. Musculoskeletal: Left hip prosthesis with associated streak artifacts. Posterior left iliac bone lytic lesion. Anterior, inferior L2 vertebral body lytic lesion with mild paraspinal  extension. Anterior, superior S1 vertebral  lytic lesion. L4 kyphoplasty and small lytic areas. Review of the MIP images confirms the above findings. IMPRESSION: 1. Right upper lobe lung cancer with metastatic mediastinal and right hilar adenopathy and possible metastatic left hilar adenopathy. 2. Multiple bony metastases with interval collapse of the T8 vertebral body with paraspinal extension of tumor, including into the anterior aspect of the spinal canal, causing moderate canal stenosis. This could progressed to cord compression. 3. Multiple small right lung nodules, suspicious for possible metastases. 4. No pulmonary emboli. 5. No acute abnormality in the abdomen or pelvis other than interval gallbladder dilatation. 6.  Calcific coronary artery and aortic atherosclerosis. Critical Value/emergent results were called by telephone at the time of interpretation on 05/02/2021 at 6:02 pm to provider [facility] , who verbally acknowledged these results. Electronically Signed   By: Claudie Revering M.D.   On: 05/02/2021 18:02   MR BRAIN W WO CONTRAST  Result Date: 05/13/2021 CLINICAL DATA:  Non-small cell lung carcinoma staging. EXAM: MRI HEAD WITHOUT AND WITH CONTRAST TECHNIQUE: Multiplanar, multiecho pulse sequences of the brain and surrounding structures were obtained without and with intravenous contrast. CONTRAST:  29mL GADAVIST GADOBUTROL 1 MMOL/ML IV SOLN COMPARISON:  None. FINDINGS: Motion degraded examination. Brain: No acute infarct, mass effect or extra-axial collection. No acute or chronic hemorrhage. There is multifocal hyperintense T2-weighted signal within the white matter. Generalized volume loss without a clear lobar predilection. The midline structures are normal. Punctate focus of contrast enhancement at the left insula (18:10). Vascular: Major flow voids are preserved. Skull and upper cervical spine: Normal calvarium and skull base. Visualized upper cervical spine and soft tissues are normal.  Sinuses/Orbits:No paranasal sinus fluid levels or advanced mucosal thickening. No mastoid or middle ear effusion. Normal orbits. IMPRESSION: 1. Motion degraded study. 2. Punctate focus of contrast enhancement at the left insula, which may be vascular, though a small metastatic lesion would be difficult to exclude. Attention on follow-up. Electronically Signed   By: Ulyses Jarred M.D.   On: 05/13/2021 22:49   CT Abdomen Pelvis W Contrast  Result Date: 05/02/2021 CLINICAL DATA:  Abdominal pain and acute, non localized lower to mid back pain. Back surgery 3 weeks ago. Recently diagnosed COVID-19. EXAM: CT ANGIOGRAPHY CHEST CT ABDOMEN AND PELVIS WITH CONTRAST TECHNIQUE: Multidetector CT imaging of the chest was performed using the standard protocol during bolus administration of intravenous contrast. Multiplanar CT image reconstructions and MIPs were obtained to evaluate the vascular anatomy. Multidetector CT imaging of the abdomen and pelvis was performed using the standard protocol during bolus administration of intravenous contrast. CONTRAST:  87mL OMNIPAQUE IOHEXOL 350 MG/ML SOLN COMPARISON:  Chest, abdomen and pelvis CT dated 03/15/2021 FINDINGS: CTA CHEST FINDINGS Cardiovascular: Mildly enlarged heart. Normally opacified pulmonary arteries with no pulmonary arterial filling defects seen. Atheromatous calcifications, including the coronary arteries and aorta. Mediastinum/Nodes: Multiple significantly enlarged, confluent, low density the right hilar lymph nodes with vascular encasement with mild progression. This includes a lateral right hilar nodal mass with a short axis diameter of 3.3 cm on image number 41/4. Enlarged left hilar node without significant change, measuring 1.1 cm in short axis diameter on image number 41/4 Enlarged precarinal node with a short axis diameter of 1.3 cm on image number 35/4. Unremarkable esophagus.  Small, poorly visualized thyroid gland. Lungs/Pleura: Irregular right upper lobe  mass measuring 5.1 x 3.2 cm on image number 39/11. This previously measured 4.4 x 2.3 cm in corresponding dimensions. 3 mm nodule more inferiorly in the outer right upper  lobe on image number 47/11, without significant change. Four additional smaller nodules in the superior aspect of the right middle lobe, without significant change. Mild bilateral centrilobular bullous changes without significant change. Mild peripheral eccentric or a shin of the interstitial markings in both lungs without significant change. No pleural fluid. Musculoskeletal: Lytic and sclerotic changes involving the T8 vertebral body with interval 50% collapse of the vertebral body and soft tissue extending into the anterior aspect of the spinal canal, causing moderate canal stenosis. There is also mild diffuse paraspinal extension extending superiorly and inferiorly. This is also involving the pedicles and posterior elements bilaterally with a pars fracture on the left. The fracture is nondisplaced. Lytic lesion in the posterior, superior aspect of the T9 vertebral body. Additional lytic lesion involving the inferior aspects of the T9 inferior facets without canal extension. There is a suggestion of poorly defined, lytic areas in additional thoracic vertebral bodies. Stable old, mild T11 and T12 superior endplate compression deformities. Thoracic and lower cervical spine degenerative changes. There is also an oval lytic lesion in the posterior aspect of the left 3rd rib. Review of the MIP images confirms the above findings. CT ABDOMEN and PELVIS FINDINGS Hepatobiliary: Interval dilated gallbladder without visible gallstones or wall thickening. No pericholecystic fluid. Unremarkable liver. Pancreas: Moderate diffuse pancreatic atrophy. Spleen: Normal in size without focal abnormality. Adrenals/Urinary Tract: Adrenal glands are unremarkable. Kidneys are normal, without renal calculi, focal lesion, or hydronephrosis. Bladder is unremarkable.  Stomach/Bowel: Stomach is within normal limits. Appendix appears normal. No evidence of bowel wall thickening, distention, or inflammatory changes. Vascular/Lymphatic: Atheromatous arterial calcifications without aneurysm. No enlarged lymph nodes. Reproductive: Uterus and bilateral adnexa are unremarkable. Other: Small umbilical hernia containing fat. Musculoskeletal: Left hip prosthesis with associated streak artifacts. Posterior left iliac bone lytic lesion. Anterior, inferior L2 vertebral body lytic lesion with mild paraspinal extension. Anterior, superior S1 vertebral lytic lesion. L4 kyphoplasty and small lytic areas. Review of the MIP images confirms the above findings. IMPRESSION: 1. Right upper lobe lung cancer with metastatic mediastinal and right hilar adenopathy and possible metastatic left hilar adenopathy. 2. Multiple bony metastases with interval collapse of the T8 vertebral body with paraspinal extension of tumor, including into the anterior aspect of the spinal canal, causing moderate canal stenosis. This could progressed to cord compression. 3. Multiple small right lung nodules, suspicious for possible metastases. 4. No pulmonary emboli. 5. No acute abnormality in the abdomen or pelvis other than interval gallbladder dilatation. 6.  Calcific coronary artery and aortic atherosclerosis. Critical Value/emergent results were called by telephone at the time of interpretation on 05/02/2021 at 6:02 pm to provider [facility] , who verbally acknowledged these results. Electronically Signed   By: Claudie Revering M.D.   On: 05/02/2021 18:02   DG CHEST PORT 1 VIEW  Result Date: 05/16/2021 CLINICAL DATA:  70 year old female with shortness of breath. Right upper lobe lung cancer. EXAM: PORTABLE CHEST 1 VIEW COMPARISON:  Portable chest 05/11/2021 and earlier. FINDINGS: Portable AP semi upright view at 0433 hours. Right upper lobe mass and asymmetric enlargement of the right hilum again noted. Stable cardiomegaly  and mediastinal contours. No pneumothorax or pulmonary edema. But interval opacification of the left lung base largely obscuring the left hemidiaphragm. Left pleural effusion is possible. Negative visible bowel gas pattern. Stable visualized osseous structures, pathologic near vertebra plana of T8. IMPRESSION: 1. Interval opacification of the left lung base which could reflect a combination of atelectasis, pneumonia, small pleural effusion. 2. Otherwise stable chest  with right upper lobe lung mass, abnormal right hilum and T8 vertebra. Electronically Signed   By: Genevie Ann M.D.   On: 05/16/2021 04:58   DG CHEST PORT 1 VIEW  Result Date: 05/11/2021 CLINICAL DATA:  Shortness of breath, COVID-19 positivity EXAM: PORTABLE CHEST 1 VIEW COMPARISON:  03/15/2021, CT from 05/02/2021 FINDINGS: Cardiac shadow is enlarged in size. Aortic calcifications are again seen. Known right upper lobe mass lesion and right hilar prominence are again noted and stable. Lungs are otherwise clear. No bony abnormality is seen. IMPRESSION: Changes consistent with the known right upper lobe mass lesion. No new acute abnormality is noted. Electronically Signed   By: Inez Catalina M.D.   On: 05/11/2021 16:26   MR TOTAL SPINE METS SCREENING  Result Date: 05/02/2021 CLINICAL DATA:  Metastatic disease with T8 fracture EXAM: MRI TOTAL SPINE WITHOUT AND WITH CONTRAST TECHNIQUE: Multisequence MR imaging of the spine from the cervical spine to the sacrum was performed prior to and following IV contrast administration for evaluation of spinal metastatic disease. CONTRAST:  24mL GADAVIST GADOBUTROL 1 MMOL/ML IV SOLN COMPARISON:  None. FINDINGS: The examination is severely degraded by motion. Additionally, there are substantial technical artifacts affecting the scan that are of unclear etiology. MRI CERVICAL SPINE FINDINGS Alignment: Normal Vertebrae: No fracture, evidence of discitis, or bone lesion. Cord: Limited visualization of the spinal cord due to  motion. Question some hyperintense T2-weighted signal in the lower cervical spinal cord. Posterior Fossa, vertebral arteries, paraspinal tissues: Unremarkable Disc levels: There is multilevel degenerative disc disease with mild spinal canal stenosis at C3-4, C4-5 and C5-6. MRI THORACIC SPINE FINDINGS Alignment:  Physiologic. Vertebrae: Wedge compression fracture of T8 with approximately 25% height loss. There is also abnormal signal extending into the posterior elements of T8. Signal changes at the inferior T7 and superior T9 endplates are likely due to edema. Cord:  Normal signal. Paraspinal and other soft tissues: 15 mm nodule in the right upper lobe. Disc levels: There is at least moderate spinal canal stenosis at the T8 level. Mild deformity of the spinal cord without signal change. MRI LUMBAR SPINE FINDINGS Segmentation:  Standard Alignment:  Third grade 1 anterolisthesis at L4-5 Vertebrae: Abnormal signal in the L2, L4 and S1 bodies compatible with metastatic disease. Conus medullaris: Extends to the L1 level and appears normal. Paraspinal and other soft tissues: Negative Disc levels: No lumbar spinal canal stenosis. Mild bilateral L4 neural foraminal stenosis. IMPRESSION: 1. Severely motion degraded examination. 2. Wedge compression fracture of T8 with approximately 25% height loss. Abnormal signal extending into the posterior elements of T8 suggests a pathologic fracture. 3. At least moderate spinal canal stenosis at the T8 level with mild deformity of the spinal cord. 4. Abnormal signal in the L2, L4 and S1 bodies compatible with metastatic disease. 5. Multilevel cervical degenerative disc disease with mild spinal canal stenosis at C3-4, C4-5 and C5-6. Electronically Signed   By: Ulyses Jarred M.D.   On: 05/02/2021 21:29       Subjective: Patient seen and examined at the bedside this morning.  Extremely weak, lethargic  Discharge Exam: Vitals:   05/16/21 0800 05/16/21 1455  BP: 121/76 99/68   Pulse:  100  Resp: 11 15  Temp:    SpO2:  (!) 88%   Vitals:   05/16/21 0600 05/16/21 0700 05/16/21 0800 05/16/21 1455  BP: 111/70  121/76 99/68  Pulse: (!) 111 (!) 59  100  Resp: 14 11 11 15   Temp:  TempSrc:      SpO2: (!) 89% 100%  (!) 88%  Weight:      Height:        General: Pt is not alert, awake Cardiovascular: Irregularly irregular rhythm Respiratory: Diminished air sounds bilaterally Abdominal: Soft, NT, ND, bowel sounds + Extremities: no edema, no cyanosis    The results of significant diagnostics from this hospitalization (including imaging, microbiology, ancillary and laboratory) are listed below for reference.     Microbiology: Recent Results (from the past 240 hour(s))  Expectorated Sputum Assessment w Gram Stain, Rflx to Resp Cult     Status: None   Collection Time: 05/07/21  6:03 PM   Specimen: Expectorated Sputum  Result Value Ref Range Status   Specimen Description EXPECTORATED SPUTUM  Final   Special Requests NONE  Final   Sputum evaluation   Final    THIS SPECIMEN IS ACCEPTABLE FOR SPUTUM CULTURE Performed at [facility], [place] [address] [place], [place] [address]    Report Status 05/07/2021 FINAL  Final  Culture, Respiratory w Gram Stain     Status: None   Collection Time: 05/07/21  6:03 PM  Result Value Ref Range Status   Specimen Description   Final    EXPECTORATED SPUTUM Performed at [facility] [address] [place], [place] [address]    Special Requests   Final    NONE Reflexed from [id number] Performed at [facility], [place] [address], [place], [place] [address]    Gram Stain   Final    MODERATE WBC PRESENT,BOTH PMN AND MONONUCLEAR RARE GRAM POSITIVE COCCI IN PAIRS    Culture   Final    RARE Consistent with normal respiratory flora. No Pseudomonas species isolated Performed at [facility] [address] [place], [place] [address]    Report Status 05/10/2021  FINAL  Final     Labs: BNP (last 3 results) Recent Labs    05/08/21 0417 05/09/21 0414 05/10/21 0258  BNP 373.0* 299.4* 308.6*   Basic Metabolic Panel: Recent Labs  Lab 05/13/21 0346 05/14/21 0346 05/15/21 0346 05/16/21 0420 05/17/21 0415  NA 131* 133* 133* 134* 135  K 4.4 5.2* 3.6 3.7 3.8  CL 94* 92* 90* 90* 91*  CO2 27 31 34* 31 33*  GLUCOSE 214* 210* 238* 173* 143*  BUN 33* 36* 51* 52* 47*  CREATININE 0.71 0.79 0.73 0.51 0.65  CALCIUM 8.8* 9.0 8.7* 9.7 10.2   Liver Function Tests: Recent Labs  Lab 05/11/21 0408 05/12/21 0851 05/13/21 0346  AST 125* 91* 76*  ALT 227* 196* 169*  ALKPHOS 121 119 105  BILITOT 0.4 0.6 0.7  PROT 6.4* 7.0 6.6  ALBUMIN 2.3* 2.5* 2.5*   No results for input(s): LIPASE, AMYLASE in the last 168 hours. No results for input(s): AMMONIA in the last 168 hours. CBC: Recent Labs  Lab 05/13/21 0346 05/14/21 0346 05/15/21 0346 05/16/21 0420 05/17/21 0415  WBC 17.8* 19.4* 21.4* 26.3* 33.6*  NEUTROABS 15.5* 17.1* 18.7* 23.3* 31.8*  HGB 10.5* 11.6* 11.2* 11.7* 10.9*  HCT 32.6* 36.5 35.2* 37.3 34.9*  MCV 85.1 86.5 85.0 85.4 87.5  PLT 300 315 292 261 217   Cardiac Enzymes: No results for input(s): CKTOTAL, CKMB, CKMBINDEX, TROPONINI in the last 168 hours. BNP: Invalid input(s): POCBNP CBG: Recent Labs  Lab 05/16/21 0748 05/16/21 1250 05/16/21 1638 05/16/21 2145 05/17/21 0743  GLUCAP 184* 204* 174* 149* 129*   D-Dimer No results for input(s): DDIMER  in the last 72 hours. Hgb A1c No results for input(s): HGBA1C in the last 72 hours. Lipid Profile No results for input(s): CHOL, HDL, LDLCALC, TRIG, CHOLHDL, LDLDIRECT in the last 72 hours. Thyroid function studies No results for input(s): TSH, T4TOTAL, T3FREE, THYROIDAB in the last 72 hours.  Invalid input(s): FREET3 Anemia work up No results for input(s): VITAMINB12, FOLATE, FERRITIN, TIBC, IRON, RETICCTPCT in the last 72 hours. Urinalysis    Component Value Date/Time    COLORURINE YELLOW 05/02/2021 1701   APPEARANCEUR HAZY (A) 05/02/2021 1701   LABSPEC 1.013 05/02/2021 1701   PHURINE 5.0 05/02/2021 1701   GLUCOSEU NEGATIVE 05/02/2021 1701   HGBUR LARGE (A) 05/02/2021 1701   BILIRUBINUR NEGATIVE 05/02/2021 1701   BILIRUBINUR SMALL 09/03/2015 0924   KETONESUR NEGATIVE 05/02/2021 1701   PROTEINUR 100 (A) 05/02/2021 1701   UROBILINOGEN 1.0 09/11/2015 1151   NITRITE NEGATIVE 05/02/2021 1701   LEUKOCYTESUR TRACE (A) 05/02/2021 1701   Sepsis Labs Invalid input(s): PROCALCITONIN,  WBC,  LACTICIDVEN Microbiology Recent Results (from the past 240 hour(s))  Expectorated Sputum Assessment w Gram Stain, Rflx to Resp Cult     Status: None   Collection Time: 05/07/21  6:03 PM   Specimen: Expectorated Sputum  Result Value Ref Range Status   Specimen Description EXPECTORATED SPUTUM  Final   Special Requests NONE  Final   Sputum evaluation   Final    THIS SPECIMEN IS ACCEPTABLE FOR SPUTUM CULTURE Performed at [facility], [place] [address], [place], [place] [address]    Report Status 05/07/2021 FINAL  Final  Culture, Respiratory w Gram Stain     Status: None   Collection Time: 05/07/21  6:03 PM  Result Value Ref Range Status   Specimen Description   Final    EXPECTORATED SPUTUM Performed at [facility] [address], [place], [place] [address]    Special Requests   Final    NONE Reflexed from [id number] Performed at [facility], [place] [address] [place], [place] [address]    Gram Stain   Final    MODERATE WBC PRESENT,BOTH PMN AND MONONUCLEAR RARE GRAM POSITIVE COCCI IN PAIRS    Culture   Final    RARE Consistent with normal respiratory flora. No Pseudomonas species isolated Performed at [facility] [address] [place], [place] [address]    Report Status 05/10/2021 FINAL  Final    Please note: You were cared for by a hospitalist during your hospital stay. Once you are discharged,  your primary care physician will handle any further medical issues. Please note that NO REFILLS for any discharge medications will be authorized once you are discharged, as it is imperative that you return to your primary care physician (or establish a relationship with a primary care physician if you do not have one) for your post hospital discharge needs so that they can reassess your need for medications and monitor your lab values.    Time coordinating discharge: 40 minutes  SIGNED:   Shelly Coss, MD  [facility] 05/17/2021, 11:53 AM Pager [phone]  If 7PM-7AM, please contact night-coverage [email or web address] Password [email or web address]

## 2021-05-17 NOTE — Progress Notes (Addendum)
Manufacturing engineer [facility]) Hospital Liaison note.    Addendum: Consents are complete at this time.  TOC aware.  Transport can be lined up at any time.  Thank you.   Chart reviewed and eligibility confirmed for [facility]. Spoke with family to confirm interest and explain services. Family agreeable to transfer today. TOC aware.    [facility] will notify TOC when registration paperwork has been completed to arrange transport.   RN please call report to [phone].  Thank you for the opportunity to participate in this patient's care.  Domenic Moras, BSN, RN [facility] Liaison (listed on [place] under Hospice/[facility])    [phone] (24h on call)

## 2021-05-17 NOTE — TOC Progression Note (Addendum)
Transition of Care [facility]) - Progression Note    Patient Details  Name: [name] MRN: [id number] Date of Birth: July 28, 1951  Transition of Care [facility]) CM/SW Contact  Lennart Pall, [place] Phone Number: 05/17/2021, 10:59 AM  Clinical Narrative:    Alerted by Dr. Domingo Cocking and Dr. Tawanna Solo that family has decided to pursue residential hospice care.  Met with spouse at bedside and he confirms wishes and requests referral to [facility]/ [facility].  Have made referral to [facility] with Domenic Moras, RN who will follow up with pt/family today and keep TOC posted on bed availability.  Addendum:  Bed available at [facility] today. PTAR called at 1:35. RN please call report to [phone].  No further TOC needs.   Expected Discharge Plan: [place] (vs SNF) Barriers to Discharge: No Barriers Identified  Expected Discharge Plan and Services Expected Discharge Plan: [place] (vs SNF)       Living arrangements for the past 2 months: Single Family Home                                       Social Determinants of Health (SDOH) Interventions    Readmission Risk Interventions Readmission Risk Prevention Plan 11/10/2020  Transportation Screening Complete  PCP or Specialist Appt within 5-7 Days Complete  Home Care Screening Complete  Medication Review (RN CM) Complete  Some recent data might be hidden

## 2021-05-17 NOTE — Discharge Instructions (Signed)
Bone Metastasis  Bone metastasis is cancer that has spread from the part of the body where it started to the bones. A person may have bone metastasis in one bone or in more than one bone. Cancer that spreads to the bones is different from cancer that starts in the bones (primary bone cancer). Bone metastasis is more common than primary bone cancer. The spine is the most common area for bone metastasis to occur. Other common areas include:  Hip bone (pelvis).  Ribs.  Skull.  Long bones of the arm or leg. Bone metastasis is painful. It also damages and weakens bones so that they may break easier, even from a minor injury. What are the causes? This condition is caused by cancer cells that spread to the bone. Cancer cells can spread to the rest of the body in two ways:  Through the bloodstream.  Through the vessels that carry white blood cells in the body (lymphatic system). What increases the risk? This condition is more likely to develop in people who have an advanced type of cancer that is known to spread to bone. Cancers that often spread to bone include:  Breast cancer.  Prostate cancer.  Lung cancer.  Thyroid cancer.  Kidney cancer.  Multiple myeloma.  Lymphoma. What are the signs or symptoms? The most common symptom of this condition is bone pain, especially while you are resting. Other symptoms include:  A broken bone (fracture) that happens with little or no trauma.  Low number of red blood cells (anemia). Bone destruction may damage the spongy tissue (bone marrow) in the center of bones where red blood cells are produced. Anemia can cause: ? Weakness. ? Shortness of breath. ? Headache. ? Dizziness.  Back or neck pain with numbness or weakness.  High levels of calcium in your blood (hypercalcemia). When bone is destroyed, calcium is released into your blood. Symptoms of hypercalcemia include: ? Constipation. ? Thirst. ? Nausea. ? Sleepiness. How is this  diagnosed? This condition may be diagnosed based on:  Your symptoms and medical history. Your health care provider may suspect this condition if you are being treated for cancer or have had cancer treatment in the past.  A physical exam.  Imaging studies, such as: ? Bone X-rays, especially in the area where you have pain. ? CT scan. ? Bone scan. ? MRI. ? PET scan.  Blood tests.  Urine tests.  A procedure to remove a piece of bone so it can be examined under a microscope (biopsy). How is this treated? Treatment for this condition depends on your overall health, the type of cancer you have, and how much the cancer has spread. You will work with a team of health care providers to determine which treatment is best for you. Treatment will focus on managing pain, preventing bone weakness, and slowing the spread of the cancer. Treatment may include:  Radiation therapy. This treatment uses X-rays to kill cancer cells. It is most effective for reducing pain, stopping tumor growth, and lowering the risk of fractures.  Radioisotope therapy. This treatment uses a radioactive medicine that is injected into your blood. The medicine travels to areas where cancer cells are active and kills them.  Chemotherapy. For this treatment, you are given cancer-killing medicines. You may have chemotherapy in cycles, with rest periods in between.  Medicines that: ? Help build bone (bisphosphonates and denosumab). These medicines are used to make bones stronger and control bone pain. They may also help to reduce hypercalcemia. ?  Reduce pain (opiates).  Endocrine therapies. These therapies slow cancer growth by blocking specific chemical messengers (hormones). Some types of cancer, including breast and prostate cancers, may grow because of hormones in the body.  Targeted therapy. These drugs are used to block the growth and spread of cancer cells. These drugs target a specific part of the cancer cell and usually  cause fewer side effects than chemotherapy.  Immunotherapies. These therapies use the body's defense system (immune system) to fight cancer cells.  Surgery. You may have surgery to remove bone cancer or to prevent or repair a fracture. Follow these instructions at home:  Take medicines only as directed by your health care provider.  If you are taking prescription pain medicine, take actions to prevent or treat constipation. Your health care provider may recommend that you: ? Drink enough fluid to keep your urine pale yellow. ? Eat foods that are high in fiber, such as fresh fruits and vegetables, whole grains, and beans. ? Limit foods that are high in fat and processed sugars, such as fried and sweet foods. ? Take an over-the-counter or prescription medicine for constipation.  Use devices to help you move around (mobility aids) as needed, such as canes, walkers, or scooters.  Do not drive or use heavy machinery while taking pain medicine.  Do not drink alcohol.  Do not use any products that contain nicotine or tobacco, such as cigarettes and e-cigarettes. If you need help quitting, ask your health care provider.  Keep all follow-up visits as told by your health care provider. This is important.   Contact a health care provider if:  Your pain medicine is not helping.  You are not able to care for yourself at home. Get help right away if:  You fall or have an injury.  Your pain suddenly gets worse.  You have trouble walking.  You have numbness or tingling in your legs.  You lose control of your bowels or your bladder.  You are very sleepy or confused. Summary  Bone metastasis is cancer that has spread from the part of the body where it started to the bones. This condition is more common than cancer that starts in the bones (primary bone cancer).  Bone metastasis is painful and damages and weakens the bones. It can also cause anemia and hypercalcemia.  Treatment for this  condition depends on your overall health, the type of cancer you have, and how much the cancer has spread.  Work with your health care team to determine which treatment is best for you. Take all medicines only as told by your health care provider. This information is not intended to replace advice given to you by your health care provider. Make sure you discuss any questions you have with your health care provider. Document Revised: 02/01/2020 Document Reviewed: 01/10/2018 Elsevier Patient Education  2021 [employer].

## 2021-05-18 ENCOUNTER — Ambulatory Visit: Payer: Medicare Other

## 2021-05-18 ENCOUNTER — Telehealth: Payer: Self-pay | Admitting: Radiation Oncology

## 2021-05-18 ENCOUNTER — Encounter: Payer: Self-pay | Admitting: Radiation Oncology

## 2021-05-18 NOTE — Telephone Encounter (Signed)
I called the patient's husband and had to leave a voicemail, on the message let him know we heard she had transitioned to hospice care. As a result, we will cancel her treatments.

## 2021-05-19 ENCOUNTER — Encounter: Payer: Self-pay | Admitting: *Deleted

## 2021-05-19 ENCOUNTER — Ambulatory Visit: Payer: Medicare Other

## 2021-05-20 ENCOUNTER — Ambulatory Visit: Payer: Medicare Other

## 2021-05-21 ENCOUNTER — Ambulatory Visit: Payer: Medicare Other

## 2021-05-22 ENCOUNTER — Ambulatory Visit: Payer: Medicare Other

## 2021-05-26 ENCOUNTER — Ambulatory Visit: Payer: Medicare Other

## 2021-05-27 ENCOUNTER — Ambulatory Visit: Payer: Medicare Other

## 2021-05-27 ENCOUNTER — Other Ambulatory Visit: Payer: Self-pay | Admitting: Radiation Therapy

## 2021-05-27 NOTE — Progress Notes (Signed)
I was updated that patient needed an appt with Dr. Julien Nordmann after discharge.  I followed up on patients notes and she is not with Hospice care. No need for her to be seen with med onc at this time.

## 2021-05-27 DEATH — deceased

## 2021-05-28 ENCOUNTER — Ambulatory Visit: Payer: Medicare Other

## 2021-06-01 NOTE — Progress Notes (Signed)
  Radiation Oncology         [phone] ________________________________  Name: [name] MRN: [id number]  Date: 05/18/2021  DOB: 13-Feb-1951  End of Treatment Note  Diagnosis:    Stage IV, NSCLC, squamous cell carcinoma with bone metastases   Indication for treatment:  palliative       Radiation treatment dates:  05/13/21-05/15/21  Site/planned dose:   T8 spine was planned with conventional planning techniques.  Narrative: The patient's status declined during her course of radiation, and her radiotherapy was discontinued after 3 treatments.  Plan: The patient was discharged from the hospital with hospice care. ________________________________     Carola Rhine, PAC
